# Patient Record
Sex: Male | Born: 1940 | Race: White | Hispanic: No | Marital: Married | Smoking: Former smoker
Health system: Southern US, Community
[De-identification: ages and names within clinical notes are randomized; demographics above are authoritative.]

## PROBLEM LIST (undated history)

## (undated) DIAGNOSIS — E785 Hyperlipidemia, unspecified: Secondary | ICD-10-CM

## (undated) DIAGNOSIS — T7840XA Allergy, unspecified, initial encounter: Secondary | ICD-10-CM

## (undated) DIAGNOSIS — K209 Esophagitis, unspecified without bleeding: Secondary | ICD-10-CM

## (undated) DIAGNOSIS — E119 Type 2 diabetes mellitus without complications: Secondary | ICD-10-CM

## (undated) DIAGNOSIS — J9610 Chronic respiratory failure, unspecified whether with hypoxia or hypercapnia: Secondary | ICD-10-CM

## (undated) DIAGNOSIS — J069 Acute upper respiratory infection, unspecified: Secondary | ICD-10-CM

## (undated) DIAGNOSIS — E039 Hypothyroidism, unspecified: Secondary | ICD-10-CM

## (undated) DIAGNOSIS — M19019 Primary osteoarthritis, unspecified shoulder: Secondary | ICD-10-CM

## (undated) DIAGNOSIS — N529 Male erectile dysfunction, unspecified: Secondary | ICD-10-CM

## (undated) DIAGNOSIS — R55 Syncope and collapse: Secondary | ICD-10-CM

## (undated) DIAGNOSIS — K222 Esophageal obstruction: Secondary | ICD-10-CM

## (undated) DIAGNOSIS — R42 Dizziness and giddiness: Secondary | ICD-10-CM

## (undated) DIAGNOSIS — G629 Polyneuropathy, unspecified: Secondary | ICD-10-CM

## (undated) DIAGNOSIS — I471 Supraventricular tachycardia, unspecified: Secondary | ICD-10-CM

## (undated) DIAGNOSIS — J449 Chronic obstructive pulmonary disease, unspecified: Secondary | ICD-10-CM

## (undated) DIAGNOSIS — J322 Chronic ethmoidal sinusitis: Secondary | ICD-10-CM

## (undated) DIAGNOSIS — J45909 Unspecified asthma, uncomplicated: Secondary | ICD-10-CM

## (undated) DIAGNOSIS — I480 Paroxysmal atrial fibrillation: Secondary | ICD-10-CM

## (undated) DIAGNOSIS — C679 Malignant neoplasm of bladder, unspecified: Secondary | ICD-10-CM

## (undated) DIAGNOSIS — I951 Orthostatic hypotension: Secondary | ICD-10-CM

## (undated) DIAGNOSIS — Z923 Personal history of irradiation: Secondary | ICD-10-CM

## (undated) DIAGNOSIS — G4700 Insomnia, unspecified: Secondary | ICD-10-CM

## (undated) DIAGNOSIS — C3490 Malignant neoplasm of unspecified part of unspecified bronchus or lung: Secondary | ICD-10-CM

## (undated) HISTORY — PX: CYSTOSCOPY W/ DILATION OF BLADDER: SUR374

## (undated) HISTORY — DX: Malignant neoplasm of unspecified part of unspecified bronchus or lung: C34.90

## (undated) HISTORY — DX: Dizziness and giddiness: R42

## (undated) HISTORY — DX: Hyperlipidemia, unspecified: E78.5

## (undated) HISTORY — DX: Primary osteoarthritis, unspecified shoulder: M19.019

## (undated) HISTORY — DX: Malignant neoplasm of bladder, unspecified: C67.9

## (undated) HISTORY — DX: Insomnia, unspecified: G47.00

## (undated) HISTORY — DX: Allergy, unspecified, initial encounter: T78.40XA

## (undated) HISTORY — DX: Male erectile dysfunction, unspecified: N52.9

## (undated) HISTORY — PX: TONSILLECTOMY: SUR1361

---

## 1997-07-01 ENCOUNTER — Ambulatory Visit (HOSPITAL_COMMUNITY): Admission: RE | Admit: 1997-07-01 | Discharge: 1997-07-01 | Payer: Self-pay | Admitting: Gastroenterology

## 1997-10-25 ENCOUNTER — Emergency Department (HOSPITAL_COMMUNITY): Admission: EM | Admit: 1997-10-25 | Discharge: 1997-10-25 | Payer: Self-pay | Admitting: Emergency Medicine

## 1997-10-25 ENCOUNTER — Encounter: Payer: Self-pay | Admitting: Emergency Medicine

## 1998-05-19 ENCOUNTER — Ambulatory Visit (HOSPITAL_COMMUNITY): Admission: RE | Admit: 1998-05-19 | Discharge: 1998-05-19 | Payer: Self-pay | Admitting: General Surgery

## 1998-05-19 ENCOUNTER — Encounter: Payer: Self-pay | Admitting: General Surgery

## 2007-01-09 HISTORY — PX: COLONOSCOPY: SHX174

## 2010-07-24 ENCOUNTER — Encounter: Payer: Self-pay | Admitting: Gastroenterology

## 2011-09-27 ENCOUNTER — Encounter: Payer: Self-pay | Admitting: Gastroenterology

## 2012-03-28 ENCOUNTER — Other Ambulatory Visit: Payer: Self-pay | Admitting: *Deleted

## 2012-03-28 DIAGNOSIS — E785 Hyperlipidemia, unspecified: Secondary | ICD-10-CM

## 2012-03-28 DIAGNOSIS — E039 Hypothyroidism, unspecified: Secondary | ICD-10-CM

## 2012-03-28 DIAGNOSIS — R42 Dizziness and giddiness: Secondary | ICD-10-CM

## 2012-05-09 ENCOUNTER — Other Ambulatory Visit: Payer: Medicare Other

## 2012-05-09 DIAGNOSIS — E785 Hyperlipidemia, unspecified: Secondary | ICD-10-CM

## 2012-05-09 DIAGNOSIS — E039 Hypothyroidism, unspecified: Secondary | ICD-10-CM

## 2012-05-09 DIAGNOSIS — R42 Dizziness and giddiness: Secondary | ICD-10-CM

## 2012-05-10 LAB — COMPREHENSIVE METABOLIC PANEL
ALT: 39 IU/L (ref 0–44)
AST: 26 IU/L (ref 0–40)
Albumin/Globulin Ratio: 1.5 (ref 1.1–2.5)
Albumin: 4.2 g/dL (ref 3.5–4.8)
Alkaline Phosphatase: 82 IU/L (ref 39–117)
BUN/Creatinine Ratio: 17 (ref 10–22)
BUN: 16 mg/dL (ref 8–27)
CO2: 22 mmol/L (ref 19–28)
Calcium: 9.5 mg/dL (ref 8.6–10.2)
Chloride: 104 mmol/L (ref 97–108)
Creatinine, Ser: 0.96 mg/dL (ref 0.76–1.27)
GFR calc Af Amer: 92 mL/min/{1.73_m2} (ref >59)
GFR calc non Af Amer: 79 mL/min/{1.73_m2} (ref >59)
Globulin, Total: 2.8 g/dL (ref 1.5–4.5)
Glucose: 87 mg/dL (ref 65–99)
Potassium: 4.9 mmol/L (ref 3.5–5.2)
Sodium: 143 mmol/L (ref 134–144)
Total Bilirubin: 0.4 mg/dL (ref 0.0–1.2)
Total Protein: 7 g/dL (ref 6.0–8.5)

## 2012-05-10 LAB — MICROALBUMIN / CREATININE URINE RATIO
Creatinine, Ur: 272.7 mg/dL (ref 22.0–328.0)
MICROALB/CREAT RATIO: 3.4 mg/g creat (ref 0.0–30.0)
Microalbumin, Urine: 9.4 ug/mL (ref 0.0–17.0)

## 2012-05-10 LAB — TSH: TSH: 3.74 u[IU]/mL (ref 0.450–4.500)

## 2012-05-10 LAB — HEMOGLOBIN A1C
Est. average glucose Bld gHb Est-mCnc: 134 mg/dL
Hgb A1c MFr Bld: 6.3 % — ABNORMAL HIGH (ref 4.8–5.6)

## 2012-05-12 ENCOUNTER — Other Ambulatory Visit: Payer: Self-pay

## 2012-05-14 ENCOUNTER — Encounter: Payer: Self-pay | Admitting: *Deleted

## 2012-05-14 ENCOUNTER — Ambulatory Visit (INDEPENDENT_AMBULATORY_CARE_PROVIDER_SITE_OTHER): Payer: Medicare Other | Admitting: Internal Medicine

## 2012-05-14 ENCOUNTER — Encounter: Payer: Self-pay | Admitting: Internal Medicine

## 2012-05-14 VITALS — BP 140/86 | HR 51 | Temp 98.0°F | Resp 14 | Ht 71.0 in | Wt 214.8 lb

## 2012-05-14 DIAGNOSIS — E138 Other specified diabetes mellitus with unspecified complications: Secondary | ICD-10-CM

## 2012-05-14 DIAGNOSIS — E1365 Other specified diabetes mellitus with hyperglycemia: Secondary | ICD-10-CM

## 2012-05-14 DIAGNOSIS — M19019 Primary osteoarthritis, unspecified shoulder: Secondary | ICD-10-CM

## 2012-05-14 DIAGNOSIS — E785 Hyperlipidemia, unspecified: Secondary | ICD-10-CM | POA: Insufficient documentation

## 2012-05-14 DIAGNOSIS — C679 Malignant neoplasm of bladder, unspecified: Secondary | ICD-10-CM | POA: Insufficient documentation

## 2012-05-14 DIAGNOSIS — N529 Male erectile dysfunction, unspecified: Secondary | ICD-10-CM

## 2012-05-14 DIAGNOSIS — R5381 Other malaise: Secondary | ICD-10-CM

## 2012-05-14 DIAGNOSIS — R5383 Other fatigue: Secondary | ICD-10-CM

## 2012-05-14 NOTE — Progress Notes (Signed)
Subjective:    Patient ID: Steve [name], male    DOB: Sep 18, 1940, 72 y.o.   MRN: [id number]  Chief Complaint  Patient presents with  . Medical Managment of Chronic Issues   HPI Patient here for routine follow up.  Feels tired easily. Walks a couple of days a week.he feels tired to go to bed at 8:30-9 pm. 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 last seen 02/07/12 and then lost to follow up with bad weather. No urinary complaints He continues to have erectile dysfunction. He has been provided with levitra but has not taken it. He feels to have lost the desire for intercourse and wants to hold off on medication for now Does not exercise on regular basis Stopped taking meclizine as his dizziness resolved and currently symptom free No other complaints  Review of Systems  Constitutional: Positive for fatigue. Negative for fever, chills and appetite change.  HENT: Negative for ear pain, congestion, rhinorrhea, mouth sores and neck pain.   Eyes: Negative for visual disturbance.  Respiratory: Negative for cough and shortness of breath.   Cardiovascular: Negative for chest pain, palpitations and leg swelling.  Gastrointestinal: Negative for abdominal pain, constipation, blood in stool and abdominal distention.  Endocrine: Negative for cold intolerance, polydipsia and polyuria.  Genitourinary: Negative for hematuria, discharge and difficulty urinating.       Has lost follow up with urology.  Musculoskeletal: Positive for arthralgias. Negative for joint swelling.  Skin: Negative for rash and wound.  Neurological: Negative for dizziness, syncope, weakness, light-headedness and numbness.  Hematological: Negative for adenopathy.  Psychiatric/Behavioral: Negative for suicidal ideas, hallucinations, confusion, sleep disturbance and agitation.       Objective:   Physical Exam  Constitutional: He is oriented to person, place, and time. He appears well-developed and  well-nourished. No distress.  HENT:  Head: Normocephalic and atraumatic.  Mouth/Throat: Oropharynx is clear and moist.  Eyes: Pupils are equal, round, and reactive to light.  Neck: Normal range of motion. Neck supple.  Cardiovascular: Normal rate, regular rhythm and normal heart sounds.   Pulmonary/Chest: Effort normal and breath sounds normal. He has no wheezes. He has no rales.  Abdominal: Soft. Bowel sounds are normal. He exhibits no distension. There is no tenderness.  Musculoskeletal: Normal range of motion. He exhibits no edema and no tenderness.  Crepitus present in right shoulder > left shoulder, no signs of inflammation  Neurological: He is alert and oriented to person, place, and time.  Skin: Skin is warm and dry. He is not diaphoretic.  Psychiatric: He has a normal mood and affect. His behavior is normal.     BP 140/86  Pulse 51  Temp(Src) 98 F (36.7 C) (Oral)  Resp 14  Ht 5\' 11"  (1.803 m)  Wt 214 lb 12.8 oz (97.433 kg)  BMI 29.97 kg/m2     Labs reviewed  CMP     Component Value Date/Time   NA 143 05/09/2012 0854   K 4.9 05/09/2012 0854   CL 104 05/09/2012 0854   CO2 22 05/09/2012 0854   GLUCOSE 87 05/09/2012 0854   BUN 16 05/09/2012 0854   CREATININE 0.96 05/09/2012 0854   CALCIUM 9.5 05/09/2012 0854   PROT 7.0 05/09/2012 0854   AST 26 05/09/2012 0854   ALT 39 05/09/2012 0854   ALKPHOS 82 05/09/2012 0854   BILITOT 0.4 05/09/2012 0854   GFRNONAA 79 05/09/2012 0854   GFRAA 92 05/09/2012 0854   a1c 6.3  tsh 3.740  Urine microalbumin normal  01/26/12 wbc 8.5, hb 15.4, hct 45.9, plt 309, glu 74, bun 23, cr 0.92, na 143, k 4.4, cl 100, ca 9.5, lft wnl, a1c 6.5, total chol 206, hdl 48, ldl 189, ldl 120, psa 1.3, tsh 4.790  Assessment & Plan:   Secondary DM - off medication. Currently diet controlled. Monitor a1c periodically.   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 monitor  Impotence- pt has not taken prescribed levitra. [facility]  Hyperlipidemia- reviewed flp from 1/14. Continue zocor 20 mg daily  Fatigue- normal hb/hct, no electrolyte abnormality. Normal thyroid panel. Concerns for side effect of statin but normal LFT vs fibromyalgia with diffuse aches and tiredness. Mood is fine so depression unlikely. Will monitor for now. If this persists or worsens, will get CK and ESR level checked

## 2012-06-19 ENCOUNTER — Other Ambulatory Visit (HOSPITAL_BASED_OUTPATIENT_CLINIC_OR_DEPARTMENT_OTHER): Payer: Self-pay | Admitting: Internal Medicine

## 2012-07-01 ENCOUNTER — Other Ambulatory Visit (HOSPITAL_BASED_OUTPATIENT_CLINIC_OR_DEPARTMENT_OTHER): Payer: Self-pay | Admitting: Internal Medicine

## 2012-07-01 ENCOUNTER — Other Ambulatory Visit: Payer: Self-pay | Admitting: *Deleted

## 2012-07-01 MED ORDER — SIMVASTATIN 20 MG PO TABS: ORAL_TABLET | ORAL | Status: DC

## 2012-07-29 ENCOUNTER — Encounter: Payer: Self-pay | Admitting: Internal Medicine

## 2012-07-29 ENCOUNTER — Ambulatory Visit (INDEPENDENT_AMBULATORY_CARE_PROVIDER_SITE_OTHER): Payer: Medicare Other | Admitting: Internal Medicine

## 2012-07-29 VITALS — BP 132/78 | HR 64 | Temp 97.1°F | Resp 14 | Ht 71.0 in | Wt 210.2 lb

## 2012-07-29 DIAGNOSIS — J069 Acute upper respiratory infection, unspecified: Secondary | ICD-10-CM

## 2012-07-29 DIAGNOSIS — H8110 Benign paroxysmal vertigo, unspecified ear: Secondary | ICD-10-CM

## 2012-07-29 DIAGNOSIS — E785 Hyperlipidemia, unspecified: Secondary | ICD-10-CM

## 2012-07-29 HISTORY — DX: Acute upper respiratory infection, unspecified: J06.9

## 2012-07-29 MED ORDER — ALBUTEROL SULFATE HFA 108 (90 BASE) MCG/ACT IN AERS: 2.0000 | INHALATION_SPRAY | Freq: Two times a day (BID) | RESPIRATORY_TRACT | Status: DC | PRN

## 2012-07-29 MED ORDER — AZITHROMYCIN 250 MG PO TABS: ORAL_TABLET | ORAL | Status: DC

## 2012-07-29 MED ORDER — MECLIZINE HCL 12.5 MG PO TABS: 12.5000 mg | ORAL_TABLET | Freq: Two times a day (BID) | ORAL | Status: DC

## 2012-07-29 NOTE — Progress Notes (Signed)
Patient ID: GARV KUECHLE, male   DOB: [birth year], 72 y.o.   MRN: [id number]  Chief Complaint  Patient presents with  . Dizziness  . Cough   HPI- 72 y/o male patient is here with complaints of dizziness and cough  Dizziness- He continues to have dizzy spells especially with change of position. He feels flushed from his neck upwards. Denies any chest pain, SOB, palpitations or loss of consciousness.   Cough- He has noticed cough which is mostly dry and has wheezing for a month. He feels stuffed in his head and has humming sound in his head. Denies ringing in his ears. No sore throat. No earache or ear discharge.  He used to smoke until 6 years back  Review of Systems  Constitutional: Negative for fever, chills and diaphoresis.  HENT: Negative for congestion.   Eyes: Negative for blurred vision.  Respiratory: Negative for cough and shortness of breath.   Cardiovascular: Negative for chest pain, palpitations and leg swelling.  Gastrointestinal: Negative for heartburn, nausea, abdominal pain and constipation.  Genitourinary: Negative for dysuria.  Musculoskeletal: Negative for myalgias and falls.  Skin: Negative for itching and rash.  Neurological: Negative for focal weakness, weakness and headaches.  Psychiatric/Behavioral: Negative for depression.    No Known Allergies  Constitutional: He is oriented to person, place, and time. He appears well-developed and well-nourished. No distress.  HENT:   Head: Normocephalic and atraumatic.   Mouth/Throat: Oropharyngeal erythema present Eyes: Pupils are equal, round, and reactive to light.  Neck: Normal range of motion. Neck supple. No cervical lymphadenopathy Cardiovascular: Normal rate, regular rhythm and normal heart sounds.   Pulmonary/Chest: Effort normal and breath sounds normal. He has wheezes. He has no rales.  Abdominal: Soft. Bowel sounds are normal. He exhibits no distension. There is no tenderness.  Musculoskeletal: Normal  range of motion. He exhibits no edema and no tenderness.  Crepitus present in right shoulder > left shoulder, no signs of inflammation  Neurological: He is alert and oriented to person, place, and time.  Skin: Skin is warm and dry. He is not diaphoretic.  Psychiatric: He has a normal mood and affect. His behavior is normal.   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 Normal cardiac exam. Will have him on meclizine 12.5 mg bid for now and reassess in 3 weeks. Warned about chets pain, palpitations  Uri- with cough, oropharyngeal erythema and new wheeze concerned for URI. Will have him on azithromycin x 5 days with albuterol inhaler. Reassess after this for clinical assessment for COPD given his hx of smoking. Stop claritin for now  Hyperlipidemia- continue statin and monitor for now

## 2012-08-19 ENCOUNTER — Encounter: Payer: Self-pay | Admitting: Internal Medicine

## 2012-08-19 ENCOUNTER — Ambulatory Visit (INDEPENDENT_AMBULATORY_CARE_PROVIDER_SITE_OTHER): Payer: Medicare Other | Admitting: Internal Medicine

## 2012-08-19 VITALS — BP 132/86 | HR 67 | Temp 97.9°F | Resp 14 | Ht 71.0 in | Wt 207.2 lb

## 2012-08-19 DIAGNOSIS — M19019 Primary osteoarthritis, unspecified shoulder: Secondary | ICD-10-CM

## 2012-08-19 DIAGNOSIS — J4520 Mild intermittent asthma, uncomplicated: Secondary | ICD-10-CM

## 2012-08-19 DIAGNOSIS — E785 Hyperlipidemia, unspecified: Secondary | ICD-10-CM

## 2012-08-19 DIAGNOSIS — J45909 Unspecified asthma, uncomplicated: Secondary | ICD-10-CM

## 2012-08-19 MED ORDER — BECLOMETHASONE DIPROPIONATE 40 MCG/ACT IN AERS: 1.0000 | INHALATION_SPRAY | Freq: Two times a day (BID) | RESPIRATORY_TRACT | Status: DC

## 2012-08-19 NOTE — Progress Notes (Signed)
Patient ID: ERICE AHLES, male   DOB: 06-30-1940, 72 y.o.   MRN: [id number]  Chief Complaint  Patient presents with  . Medical Managment of Chronic Issues    still has some wheezing    No Known Allergies  HPI Patient here for routine follow up.  his cough and wheezing had improved but the other day he was mowing his yard and felt he was wheezing again. Has been a smoker in past. 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 last seen 02/07/12 and then lost to follow up with bad weather. No urinary complaints He continues to have erectile dysfunction. He has been provided with levitra but has not taken it. He feels to have lost the desire for intercourse and wants to hold off on medication for now  Does not exercise on regular basis. Tries to be careful with his diet   Review of Systems  Constitutional: Negative for fever, chills and diaphoresis.  HENT: Negative for congestion.   Eyes: Negative for blurred vision.  Respiratory: has shortness of breath with moderate exertion Cardiovascular: Negative for chest pain, palpitations and leg swelling.  Gastrointestinal: Negative for heartburn, nausea, vomiting, abdominal pain and constipation.  Genitourinary: Negative for dysuria.  Musculoskeletal: Negative for myalgias and falls.  Skin: Negative for itching and rash.  Neurological: Negative for focal weakness, weakness and headaches.  Psychiatric/Behavioral: Negative for depression.   No Known Allergies  BP 132/86  Pulse 67  Temp(Src) 97.9 F (36.6 C) (Oral)  Resp 14  Ht 5\' 11"  (1.803 m)  Wt 207 lb 3.2 oz (93.985 kg)  BMI 28.91 kg/m2  SpO2 97%  Physical Exam  Constitutional: He is oriented to person, place, and time. He appears well-developed and well-nourished. No distress.  HENT:   Head: Normocephalic and atraumatic.   Mouth/Throat: Oropharynx is clear and moist.   Eyes: Pupils are equal, round, and reactive to light.  Neck: Normal range of motion. Neck supple.  Cardiovascular: Normal rate, regular rhythm and normal heart sounds.   Pulmonary/Chest: Effort normal and breath sounds normal. He has no wheezes. He has no rales. No accessory muscle use at present Abdominal: Soft. Bowel sounds are normal. He exhibits no distension. There is no tenderness.  Musculoskeletal: Normal range of motion. He exhibits no edema and no tenderness.  Crepitus present in right shoulder > left shoulder, no signs of inflammation  Neurological: He is alert and oriented to person, place, and time.  Skin: Skin is warm and dry. He is not diaphoretic.  Psychiatric: He has a normal mood and affect. His behavior is normal.    Assessment/plan  Reactive airway disease- likely to environmental allergies. S/o mild intermittent asthma without exacerbation. Continue albuterol inhaler and will add beclomethasone inhaler bid for now.   Secondary DM - off medication. Currently diet controlled. Monitor a1c periodically.   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 monitor  Hyperlipidemia- Continue zocor 20 mg daily

## 2012-09-16 ENCOUNTER — Ambulatory Visit (INDEPENDENT_AMBULATORY_CARE_PROVIDER_SITE_OTHER): Payer: Medicare Other | Admitting: Internal Medicine

## 2012-09-16 ENCOUNTER — Encounter: Payer: Self-pay | Admitting: Internal Medicine

## 2012-09-16 VITALS — BP 142/78 | HR 74 | Temp 98.3°F | Resp 18 | Ht 71.0 in | Wt 206.8 lb

## 2012-09-16 DIAGNOSIS — J45909 Unspecified asthma, uncomplicated: Secondary | ICD-10-CM

## 2012-09-16 DIAGNOSIS — Z23 Encounter for immunization: Secondary | ICD-10-CM

## 2012-09-16 DIAGNOSIS — M19019 Primary osteoarthritis, unspecified shoulder: Secondary | ICD-10-CM

## 2012-09-16 DIAGNOSIS — H8110 Benign paroxysmal vertigo, unspecified ear: Secondary | ICD-10-CM

## 2012-09-16 MED ORDER — MELOXICAM 15 MG PO TABS: ORAL_TABLET | ORAL | Status: DC

## 2012-09-16 MED ORDER — MECLIZINE HCL 12.5 MG PO TABS: 12.5000 mg | ORAL_TABLET | Freq: Two times a day (BID) | ORAL | Status: DC

## 2012-09-16 NOTE — Progress Notes (Signed)
Patient ID: Steve [name], male   DOB: Jul 30, 1940, 72 y.o.   MRN: [id number]   Chief Complaint  Patient presents with  . Follow-up    reactive airway disease, OA  . Dizziness    dizziness    No Known Allergies  HPI Patient here for routine follow up. He complaints of being dizzy again with change of position. Denies any ringing in his ears. No headache or blurry vision during the episode. No chets pain or racing of heart associated with it. Denies syncopal episode. He has been having pain in his shoulder joints. Denies any swelling or redness of joint. Pain is worse with exercise. Relieved by rest and meloxicam  He is using the breathing treatment and feels his breathing, cough and wheezing to have improved.  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 last seen 02/07/12  Compliant with his medications  SBP mildly elevated this visit  Review of Systems   Constitutional: Negative for fever, chills and diaphoresis.   HENT: Negative for congestion.    Eyes: Negative for blurred vision.   Respiratory: negative for shortness of breath, cough  Cardiovascular: Negative for chest pain, palpitations and leg swelling.   Gastrointestinal: Negative for heartburn, nausea, vomiting, abdominal pain and constipation.   Genitourinary: Negative for dysuria.   Musculoskeletal: Negative for myalgias and falls.   Skin: Negative for itching and rash.   Neurological: Negative for focal weakness, weakness and headaches.   Psychiatric/Behavioral: Negative for depression.   BP 142/78  Pulse 74  Temp(Src) 98.3 F (36.8 C) (Oral)  Resp 18  Ht 5\' 11"  (1.803 m)  Wt 206 lb 12.8 oz (93.804 kg)  BMI 28.86 kg/m2  SpO2 98%  Physical Exam  Constitutional: He is oriented to person, place, and time. He appears well-developed and well-nourished. No distress.   HENT:   Head: Normocephalic and atraumatic.   Mouth/Throat: Oropharynx is clear and moist.   Eyes: Pupils are equal,  round, and reactive to light.   Neck: Normal range of motion. Neck supple.   Cardiovascular: Normal rate, regular rhythm and normal heart sounds.    Pulmonary/Chest: Effort normal and breath sounds normal. He has no wheezes. He has no rales. No accessory muscle use  Abdominal: Soft. Bowel sounds are normal. He exhibits no distension. There is no tenderness.  Musculoskeletal: Normal range of motion. He exhibits no edema and no tenderness.  Crepitus present in right shoulder > left shoulder, no signs of inflammation  Neurological: He is alert and oriented to person, place, and time.   Skin: Skin is warm and dry. He is not diaphoretic.  Psychiatric: He has a normal mood and affect. His behavior is normal.   Assessment/plan  Dizziness- appears to be positional with BPV. Will start him on meclizine 12.5 mg bid for now. Reassess if no improvement  Reactive airway disease- concerns for emphysema changes given his hx of smoking. Will get cxr to assess for this. To continue proventil prn and qvar bid for now. Influenza vaccine provided  Shoulder OA- under control, continue meloxicam  Hyperlipidemia- continue zocor for now

## 2012-09-20 DIAGNOSIS — Z23 Encounter for immunization: Secondary | ICD-10-CM | POA: Insufficient documentation

## 2012-09-26 ENCOUNTER — Encounter: Payer: Self-pay | Admitting: Internal Medicine

## 2012-11-14 ENCOUNTER — Other Ambulatory Visit: Payer: Medicare Other

## 2012-11-17 ENCOUNTER — Other Ambulatory Visit: Payer: Medicare Other

## 2012-11-17 ENCOUNTER — Telehealth: Payer: Self-pay

## 2012-11-17 DIAGNOSIS — E785 Hyperlipidemia, unspecified: Secondary | ICD-10-CM

## 2012-11-17 DIAGNOSIS — E1365 Other specified diabetes mellitus with hyperglycemia: Secondary | ICD-10-CM

## 2012-11-17 NOTE — Telephone Encounter (Signed)
  FYI Pt wanted to change to Dr Jarold Motto due to you didn't have an early appt available for propcolon on the date he wanted (He wasn't willing to wait.)

## 2012-11-17 NOTE — Telephone Encounter (Signed)
No problem.

## 2012-11-18 LAB — BASIC METABOLIC PANEL
GFR calc Af Amer: 93 mL/min/{1.73_m2} (ref >59)
GFR calc non Af Amer: 80 mL/min/{1.73_m2} (ref >59)
Glucose: 104 mg/dL — ABNORMAL HIGH (ref 65–99)
Potassium: 4.8 mmol/L (ref 3.5–5.2)

## 2012-11-18 LAB — LIPID PANEL
Cholesterol, Total: 196 mg/dL (ref 100–199)
LDL Calculated: 119 mg/dL — ABNORMAL HIGH (ref 0–99)
Triglycerides: 160 mg/dL — ABNORMAL HIGH (ref 0–149)
VLDL Cholesterol Cal: 32 mg/dL (ref 5–40)

## 2012-11-19 ENCOUNTER — Ambulatory Visit
Admission: RE | Admit: 2012-11-19 | Discharge: 2012-11-19 | Disposition: A | Discharge status: Home / Self Care | Payer: Medicare Other | Source: Ambulatory Visit | Attending: Internal Medicine | Admitting: Internal Medicine

## 2012-11-19 ENCOUNTER — Encounter (INDEPENDENT_AMBULATORY_CARE_PROVIDER_SITE_OTHER): Payer: Self-pay

## 2012-11-19 ENCOUNTER — Encounter: Payer: Self-pay | Admitting: Internal Medicine

## 2012-11-19 ENCOUNTER — Other Ambulatory Visit: Payer: Self-pay | Admitting: Internal Medicine

## 2012-11-19 ENCOUNTER — Ambulatory Visit (INDEPENDENT_AMBULATORY_CARE_PROVIDER_SITE_OTHER): Payer: Medicare Other | Admitting: Internal Medicine

## 2012-11-19 VITALS — BP 144/78 | HR 70 | Temp 97.7°F | Resp 16 | Ht 72.25 in | Wt 207.2 lb

## 2012-11-19 DIAGNOSIS — E039 Hypothyroidism, unspecified: Secondary | ICD-10-CM | POA: Insufficient documentation

## 2012-11-19 DIAGNOSIS — J9811 Atelectasis: Secondary | ICD-10-CM

## 2012-11-19 DIAGNOSIS — E785 Hyperlipidemia, unspecified: Secondary | ICD-10-CM

## 2012-11-19 DIAGNOSIS — E038 Other specified hypothyroidism: Secondary | ICD-10-CM

## 2012-11-19 DIAGNOSIS — J45909 Unspecified asthma, uncomplicated: Secondary | ICD-10-CM

## 2012-11-19 DIAGNOSIS — N529 Male erectile dysfunction, unspecified: Secondary | ICD-10-CM

## 2012-11-19 DIAGNOSIS — H8110 Benign paroxysmal vertigo, unspecified ear: Secondary | ICD-10-CM

## 2012-11-19 DIAGNOSIS — E1365 Other specified diabetes mellitus with hyperglycemia: Secondary | ICD-10-CM

## 2012-11-19 DIAGNOSIS — C679 Malignant neoplasm of bladder, unspecified: Secondary | ICD-10-CM

## 2012-11-19 DIAGNOSIS — M19019 Primary osteoarthritis, unspecified shoulder: Secondary | ICD-10-CM

## 2012-11-19 DIAGNOSIS — Z23 Encounter for immunization: Secondary | ICD-10-CM

## 2012-11-19 MED ORDER — TETANUS-DIPHTHERIA TOXOIDS TD 2-2 LF/0.5ML IM SUSP: 0.5000 mL | Freq: Once | INTRAMUSCULAR | Status: DC

## 2012-11-19 NOTE — Addendum Note (Signed)
Addended by: Waymond Cera on: 11/19/2012 11:17 AM   Modules accepted: Orders

## 2012-11-19 NOTE — Progress Notes (Signed)
Patient ID: Steve [name], male   DOB: 1940-09-08, 72 y.o.   MRN: [id number]   No Known Allergies  Chief Complaint  Patient presents with  . Annual Exam    physical with labs printed  . Cough    cough, SOB  x 1 yr   . Immunizations    will get Pneumo Vaccine today and needs Rxfor Tdap  . other    colonoscopy schedule 12/24/12 w/ GI, Dr Jarold Motto    HPI:  72 y/o male patient here for his annual visit. He continues to have ocassional dizziness with sudden change in position but imporved from before. He does not want to continue meclizine any more. No syncopal episode. He has pain in right shoulder intermittently He was using the breathing treatment-- albuterol but feels he has some dry wheezing and dry cough occassionally. He has ran out of the albuterol. 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 last seen 02/07/12   Compliant with his medications Reviewed his blood work result with pt and ekg from 2013  Review of Systems  Constitutional: Negative for fever, chills, weight loss, malaise/fatigue and diaphoresis.  HENT: Negative for congestion, hearing loss and sore throat.   Eyes: Negative for blurred vision, double vision and discharge.  Respiratory: Negative for sputum production, shortness of breath and wheezing.  has dry cough Cardiovascular: Negative for chest pain, palpitations, orthopnea and leg swelling.  Gastrointestinal: Negative for heartburn, nausea, vomiting, abdominal pain, diarrhea and constipation.  Genitourinary: Negative for dysuria, urgency, frequency and flank pain.  Musculoskeletal: Negative for back pain, falls and myalgias. arthritis pain in his shoulders right > left Skin: Negative for itching and rash.  Neurological: Negative for dizziness, tingling, focal weakness and headaches.  Psychiatric/Behavioral: Negative for depression and memory loss. The patient is not nervous/anxious.     Past Medical History  Diagnosis Date   . Thyroid disease   . Secondary diabetes mellitus with unspecified complication, uncontrolled   . Dizziness and giddiness   . Malignant neoplasm of bladder, part unspecified   . Hyperlipidemia   . Impotence of organic origin   . Osteoarthritis, shoulder   . Allergy    Past Surgical History  Procedure Laterality Date  . Cystoscopy w/ dilation of bladder    . Tonsillectomy     Social History:   reports that he has quit smoking. He does not have any smokeless tobacco history on file. He reports that he drinks alcohol. He reports that he does not use illicit drugs.  Family History  Problem Relation Age of Onset  . Kidney disease Brother     Medications: Patient's Medications  New Prescriptions   No medications on file  Previous Medications   DIPTHERIA-TETANUS TOXOIDS (DECAVAC) 2-2 LF/0.5ML INJECTION    Inject 0.5 mLs into the muscle once.   MELOXICAM (MOBIC) 15 MG TABLET    TAKE ONE TABLET BY MOUTH ONCE DAILY   SIMVASTATIN (ZOCOR) 20 MG TABLET    Take one tablet by mouth once daily to lower cholesterol  Modified Medications   No medications on file  Discontinued Medications   ALBUTEROL (PROVENTIL HFA;VENTOLIN HFA) 108 (90 BASE) MCG/ACT INHALER    Inhale 2 puffs into the lungs every 12 (twelve) hours as needed for wheezing or shortness of breath.   BECLOMETHASONE (QVAR) 40 MCG/ACT INHALER    Inhale 1 puff into the lungs 2 (two) times daily.   MECLIZINE (ANTIVERT) 12.5 MG TABLET    Take 1 tablet (  12.5 mg total) by mouth 2 (two) times daily.     Physical Exam: Filed Vitals:   11/19/12 0845  BP: 144/78  Pulse: 70  Temp: 97.7 F (36.5 C)  TempSrc: Oral  Resp: 16  Height: 6' 0.25" (1.835 m)  Weight: 207 lb 3.2 oz (93.985 kg)  SpO2: 99%   General- elderly male in no acute distress Head- atraumatic, normocephalic Eyes- PERRLA, EOMI, no pallor, no icterus, no discharge Neck- no lymphadenopathy, no thyromegaly, no jugular vein distension, no carotid bruit Ears- left ear  normal tympanic membrane and normal external ear canal , right ear normal tympanic membrane and normal external ear canal Chest- no chest wall deformities, no chest wall tenderness Cardiovascular- normal s1,s2, no murmurs/ rubs/ gallops Respiratory- bilateral clear to auscultation, no wheeze, no rhonchi, no crackles Abdomen- bowel sounds present, soft, non tender, no organomegaly, no abdominal bruits, no guarding or rigidity, no CVA tenderness Musculoskeletal- able to move all 4 extremities, no spinal and paraspinal tenderness, steady gait, no use of assistive device Neurological- no focal deficit, normal reflexes, normal muscle strength, normal sensation to fine touch and vibration Psychiatry- alert and oriented to person, place and time, normal mood and affect Skin- dry skin, warm   Labs reviewed: Basic Metabolic Panel:  Recent Labs  32/95/18 0854 11/17/12 0805  NA 143 143  K 4.9 4.8  CL 104 103  CO2 22 26  GLUCOSE 87 104*  BUN 16 13  CREATININE 0.96 0.95  CALCIUM 9.5 10.0   Liver Function Tests:  Recent Labs  05/09/12 0854  AST 26  ALT 39  ALKPHOS 82  BILITOT 0.4  PROT 7.0   Lipid Panel     Component Value Date/Time   TRIG 160* 11/17/2012 0805   HDL 45 11/17/2012 0805   CHOLHDL 4.4 11/17/2012 0805   LDLCALC 119* 11/17/2012 0805   2013 ekg- NSR, normal axis  Assessment/Plan  General exam- uptodate with influenza vaccine. Will provide pneumococcal vaccine. Does not want shingles vaccine. Wants to wait until new insurance gets effective for tdap. Has upcoming colonoscopy. Has been a smoker in the past. Does not want abdominal usg for screening of AAA. Reviewed labs with patient  Persistent cough/ reactive airway disease- will stop albuterol and qvar with pt not benefitting from it and not using it anymore. Pt does not want to try any other inhaler. i suspect copd/ emphysema with his hx of smoking in the past. Will get PA/lat cxr to rule out copd  changes  Hyperlipidemia- continue zocor, reviewed lab result. Dietary and exercise counseling provided  Arthritis- continue meloxicam for now, monitor clinically  BPV- under control with slow position changes, pt want to stay off meds and that is perfectly fine if symptoms are under control with position changes and exercises  Secondary dm- type2, diet  Controlled, check a1c prior to next visit  Bladder neoplasm- follows with urology  Impotence- has levitra provided by urology, has not used it recently  Subclinical hypothyroidism- currently asymptomatic, recheck tsh prior to next visit  Labs/tests ordered- cbc, lipid panel, cmp next visit

## 2012-11-24 ENCOUNTER — Ambulatory Visit
Admission: RE | Admit: 2012-11-24 | Discharge: 2012-11-24 | Disposition: A | Discharge status: Home / Self Care | Payer: Medicare Other | Source: Ambulatory Visit | Attending: Internal Medicine | Admitting: Internal Medicine

## 2012-11-24 ENCOUNTER — Other Ambulatory Visit: Payer: Medicare Other

## 2012-11-24 DIAGNOSIS — J9811 Atelectasis: Secondary | ICD-10-CM

## 2012-11-24 MED ORDER — IOHEXOL 300 MG/ML  SOLN
75.0000 mL | Freq: Once | INTRAMUSCULAR | Status: AC | PRN
  Administered 2012-11-24: 75 mL via INTRAVENOUS

## 2012-11-25 ENCOUNTER — Telehealth: Payer: Self-pay | Admitting: Internal Medicine

## 2012-11-25 ENCOUNTER — Other Ambulatory Visit: Payer: Self-pay | Admitting: *Deleted

## 2012-11-25 DIAGNOSIS — C801 Malignant (primary) neoplasm, unspecified: Secondary | ICD-10-CM

## 2012-11-25 NOTE — Telephone Encounter (Signed)
Dr. Kearney Hard, Radiologist from [facility]  Radiology called.  Because PCP was not available.  I referred to Dr. Merrilee Seashore.... Dr. Lyn Hollingshead spoke with Radiologist.  Recommended a Bronchoscopy.  This test is not scheduled at the hospital.  Steve [name] had to be scheduled to a Pulmonologist first.     Dr. Lyn Hollingshead also spoke with Mr. Judson. Appointment scheduled for  Wednesday, Nov 26, 2012 at 3:30 pm with [facility] Pulmonary Dept. If Steve [name] has questions before Dr. Glade Lloyd returns.  Dr. Lyn Hollingshead has told the patient she would be happy to speak with him.  cdavis

## 2012-11-26 ENCOUNTER — Ambulatory Visit (INDEPENDENT_AMBULATORY_CARE_PROVIDER_SITE_OTHER): Payer: Medicare Other | Admitting: Internal Medicine

## 2012-11-26 ENCOUNTER — Encounter: Payer: Self-pay | Admitting: Internal Medicine

## 2012-11-26 VITALS — BP 132/82 | HR 80 | Ht 72.5 in | Wt 209.0 lb

## 2012-11-26 DIAGNOSIS — R222 Localized swelling, mass and lump, trunk: Secondary | ICD-10-CM

## 2012-11-26 DIAGNOSIS — R918 Other nonspecific abnormal finding of lung field: Secondary | ICD-10-CM

## 2012-11-26 NOTE — Progress Notes (Signed)
Subjective:    Patient ID: Steve [name], male    DOB: 10/06/1940, 72 y.o.   MRN: [id number]  PCP PANDEY, MAHIMA, MD   HPI  IOV 11/26/2012  Chief Complaint  Patient presents with  . Pulmonary Consult    abnormal CT scan. Pt states he had a cxr for physical and it was abnormal so they did a CT. Pt states he has SOB with exertion.  Pt also states he has a productive cough with blood tinged mucus.    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 Reported as primary care physician with subacute history of cough and wheezing for several weeks. Did not improve with inhaler therapy. A chest x-ray was done this was followed by CT scan of the chest 2 days ago 11/24/2012 that shows a right-sided lung mass encasing the right main bronchus and in the subcarinal region. Therefore he is been referred here. In the last few days he's had some streaky hemoptysis. CT scan is reviewed personally and listed below. His nose is in weight loss or chest pain no wheezing or  Lung mass relevant history   reports that he quit smoking about 5 years ago. His smoking use included Cigarettes. He has a 75 pack-year smoking history. His smokeless tobacco use includes Chew.  Remote history of bladder cancer in complete remission   CT 11/24/12   CLINICAL DATA: Right lower lobe collapse on chest x-ray.  EXAM:  CT CHEST WITH CONTRAST  TECHNIQUE:  Multidetector CT imaging of the chest was performed during  intravenous contrast administration.  CONTRAST: 75mL OMNIPAQUE IOHEXOL 300 MG/ML SOLN  COMPARISON: Chest x-ray 11/19/2012  FINDINGS:  There is bulky right hilar and subcarinal adenopathy. Occlusion of  the right bronchus intermedius by a central right hilar/ infrahilar  mass. This is very difficult to measure due to the chole less since  with adenopathy and postobstructive collapse in the right lower  lobe, but the area measures approximately 6.2 x 4.7 cm on image 43  of  series 3. Associated collapse/ atelectasis in the right lower  lobe as seen on chest x-ray. Large supple carina adenopathy measures  4.7 x 2.9 cm. Right hilar adenopathy on image 35 measures 2.0 x 2.0  cm. High right peritracheal lymph node has a short axis diameter of  8 mm. No left hilar adenopathy. No axillary adenopathy.  Densely calcified coronary arteries diffusely. Heart is normal size.  Aorta is normal caliber.  No pleural effusions. Left lung is clear. Chest wall soft tissues  are unremarkable. Imaging into the upper abdomen shows no acute  findings. No acute bony abnormality.  IMPRESSION:  Large right hilar/ infrahilar mass obstructing the bronchus  intermedius with postobstructive process, likely atelectasis in the  right lower lobe. Associated bulky right hilar and subcarinal  adenopathy. Findings most consistent with primary lung cancer.  Coronary artery disease.  Electronically Signed  By: Kevin Dover M.D.  On: 11/24/2012 14:40   Past Medical History  Diagnosis Date  . Thyroid disease   . Secondary diabetes mellitus with unspecified complication, uncontrolled   . Dizziness and giddiness   . Malignant neoplasm of bladder, part unspecified   . Hyperlipidemia   . Impotence of organic origin   . Osteoarthritis, shoulder   . Allergy      Family History  Problem Relation Age of Onset  . Kidney disease Brother      History   Social History  . Marital Status: Married      Spouse Name: N/A    Number of Children: N/A  . Years of Education: N/A   Occupational History  . business owner    Social History Main Topics  . Smoking status: Former Smoker -- 1.50 packs/day for 50 years    Types: Cigarettes    Quit date: 01/09/2007  . Smokeless tobacco: Current User    Types: Chew  . Alcohol Use: Yes     Comment: 4-5 beers a month, 1 mixed drink every 2 months  . Drug Use: No  . Sexual Activity: Not on file   Other Topics Concern  . Not on file   Social History  Narrative  . No narrative on file     No Known Allergies   Outpatient Prescriptions Prior to Visit  Medication Sig Dispense Refill  . meloxicam (MOBIC) 15 MG tablet TAKE ONE TABLET BY MOUTH ONCE DAILY  90 tablet  3  . simvastatin (ZOCOR) 20 MG tablet Take one tablet by mouth once daily to lower cholesterol  90 tablet  3  . diptheria-tetanus toxoids (DECAVAC) 2-2 LF/0.5ML injection Inject 0.5 mLs into the muscle once.  0.5 mL  0   No facility-administered medications prior to visit.       Review of Systems  Constitutional: Negative for fever and unexpected weight change.  HENT: Negative for congestion, dental problem, ear pain, nosebleeds, postnasal drip, rhinorrhea, sinus pressure, sneezing, sore throat and trouble swallowing.   Eyes: Negative for redness and itching.  Respiratory: Positive for cough and shortness of breath. Negative for chest tightness and wheezing.   Cardiovascular: Negative for palpitations and leg swelling.  Gastrointestinal: Negative for nausea and vomiting.  Genitourinary: Negative for dysuria.  Musculoskeletal: Negative for joint swelling.  Skin: Negative for rash.  Neurological: Negative for headaches.  Hematological: Does not bruise/bleed easily.  Psychiatric/Behavioral: Negative for dysphoric mood. The patient is not nervous/anxious.        Objective:   Physical Exam  Nursing note and vitals reviewed. Constitutional: He is oriented to person, place, and time. He appears well-developed and well-nourished. No distress.  HENT:  Head: Normocephalic and atraumatic.  Right Ear: External ear normal.  Left Ear: External ear normal.  Mouth/Throat: Oropharynx is clear and moist. No oropharyngeal exudate.  Eyes: Conjunctivae and EOM are normal. Pupils are equal, round, and reactive to light. Right eye exhibits no discharge. Left eye exhibits no discharge. No scleral icterus.  Neck: Normal range of motion. Neck supple. No JVD present. No tracheal deviation  present. No thyromegaly present.  Cardiovascular: Normal rate, regular rhythm and intact distal pulses.  Exam reveals no gallop and no friction rub.   No murmur heard. Pulmonary/Chest: Effort normal and breath sounds normal. No respiratory distress. He has no wheezes. He has no rales. He exhibits no tenderness.  Abdominal: Soft. Bowel sounds are normal. He exhibits no distension and no mass. There is no tenderness. There is no rebound and no guarding.  Musculoskeletal: Normal range of motion. He exhibits no edema and no tenderness.  Lymphadenopathy:    He has no cervical adenopathy.  Neurological: He is alert and oriented to person, place, and time. He has normal reflexes. No cranial nerve deficit. Coordination normal.  Skin: Skin is warm and dry. No rash noted. He is not diaphoretic. No erythema. No pallor.  Psychiatric: He has a normal mood and affect. His behavior is normal. Judgment and thought content normal.          Assessment & Plan:   

## 2012-11-26 NOTE — Patient Instructions (Signed)
Do PET Scan ASAP WE will provisionally schedule for bronchoscopy under General anestehsia  - procedure called EBUS 12/15/12 at 7.30am  - I will explain this in more detail after PET scan results

## 2012-11-28 ENCOUNTER — Encounter (HOSPITAL_COMMUNITY): Payer: Self-pay | Admitting: *Deleted

## 2012-11-28 ENCOUNTER — Telehealth: Payer: Self-pay | Admitting: Internal Medicine

## 2012-11-28 NOTE — Telephone Encounter (Signed)
kristi called back from [employer]---they need to know a few things about the PET--  1.  Has there been a biopsy done? 2.  Is this test being done to decide if this is the optimal site for the biopsy? 3.  Is this being requested for a strong suspicion of a solid tumor? 4.  Is this for a lung nodule? 5.  What is the size of this area?   MR please advise so we can call kristi back with [facility] so they are able to review to see if PET will be covered for the pt.  thanks

## 2012-11-28 NOTE — Telephone Encounter (Signed)
lmomtcb for Steve [name] with [employer].

## 2012-11-28 NOTE — Telephone Encounter (Signed)
Answers  - Has a biopsy been done - NO not done - IS test being done to decide optimal site - Yes that is one of the reasons, Other being staging and to biopsy highest stage site - Is test being requested for strong suspicion of solid tumor - YES, He has 6.2cm Right lower lobe lung  mas with 4.7 cm subcarinal adenopathy and 2cm Rt hilar node - Is this for a lung nodule - YES, and a lung mass - What is size - 6.2cm RLL mass with 4.7cm subcarinal adenopathy   - Please give CT report below  Dr. Kalman Shan, M.D., [facility].C.P Pulmonary and Critical Care Medicine Staff Physician [place] [facility] [place] Pulmonary and Critical Care Pager: [phone], If no answer or between  15:00h - 7:00h: call [phone]  11/28/2012 5:28 PM       CLINICAL DATA: Right lower lobe collapse on chest x-ray.  EXAM:  CT CHEST WITH CONTRAST  TECHNIQUE:  Multidetector CT imaging of the chest was performed during  intravenous contrast administration.  CONTRAST: 75mL OMNIPAQUE IOHEXOL 300 MG/ML SOLN  COMPARISON: Chest x-ray 11/19/2012  FINDINGS:  There is bulky right hilar and subcarinal adenopathy. Occlusion of  the right bronchus intermedius by a central right hilar/ infrahilar  mass. This is very difficult to measure due to the chole less since  with adenopathy and postobstructive collapse in the right lower  lobe, but the area measures approximately 6.2 x 4.7 cm on image 43  of series 3. Associated collapse/ atelectasis in the right lower  lobe as seen on chest x-ray. Large supple carina adenopathy measures  4.7 x 2.9 cm. Right hilar adenopathy on image 35 measures 2.0 x 2.0  cm. High right peritracheal lymph node has a short axis diameter of  8 mm. No left hilar adenopathy. No axillary adenopathy.  Densely calcified coronary arteries diffusely. Heart is normal size.  Aorta is normal caliber.  No pleural effusions. Left lung is clear. Chest wall soft tissues  are unremarkable. Imaging  into the upper abdomen shows no acute  findings. No acute bony abnormality.  IMPRESSION:  Large right hilar/ infrahilar mass obstructing the bronchus  intermedius with postobstructive process, likely atelectasis in the  right lower lobe. Associated bulky right hilar and subcarinal  adenopathy. Findings most consistent with primary lung cancer.  Coronary artery disease.  Electronically Signed  By: Charlett Nose M.D.  On: 11/24/2012 14:40

## 2012-11-30 NOTE — Telephone Encounter (Signed)
noted 

## 2012-11-30 NOTE — Assessment & Plan Note (Signed)
Likely lung cancer  PLAN Do PET Scan ASAP WE will provisionally schedule for bronchoscopy under General anestehsia  - procedure called EBUS 12/15/12 at 7.30am  - I will explain this in more detail after PET scan results

## 2012-12-01 NOTE — Telephone Encounter (Signed)
LMTCBx1 to give Kristi the information below. Carron Curie, CMA

## 2012-12-02 ENCOUNTER — Encounter: Payer: Self-pay | Admitting: Internal Medicine

## 2012-12-02 ENCOUNTER — Encounter (HOSPITAL_COMMUNITY): Payer: Self-pay | Admitting: Pharmacist

## 2012-12-02 NOTE — Telephone Encounter (Signed)
LMTCBx2 to give Silva Bandy the information below. Carron Curie, CMA

## 2012-12-03 ENCOUNTER — Encounter (HOSPITAL_COMMUNITY): Payer: Self-pay

## 2012-12-03 ENCOUNTER — Encounter (HOSPITAL_COMMUNITY)
Admission: RE | Admit: 2012-12-03 | Discharge: 2012-12-03 | Disposition: A | Discharge status: Home / Self Care | Payer: Medicare Other | Source: Ambulatory Visit | Attending: Internal Medicine | Admitting: Internal Medicine

## 2012-12-03 DIAGNOSIS — R918 Other nonspecific abnormal finding of lung field: Secondary | ICD-10-CM

## 2012-12-03 DIAGNOSIS — I700 Atherosclerosis of aorta: Secondary | ICD-10-CM | POA: Insufficient documentation

## 2012-12-03 DIAGNOSIS — R222 Localized swelling, mass and lump, trunk: Secondary | ICD-10-CM | POA: Insufficient documentation

## 2012-12-03 DIAGNOSIS — C7800 Secondary malignant neoplasm of unspecified lung: Secondary | ICD-10-CM | POA: Insufficient documentation

## 2012-12-03 LAB — GLUCOSE, CAPILLARY: Glucose-Capillary: 100 mg/dL — ABNORMAL HIGH (ref 70–99)

## 2012-12-03 MED ORDER — FLUDEOXYGLUCOSE F - 18 (FDG) INJECTION
18.9000 | Freq: Once | INTRAVENOUS | Status: AC | PRN
  Administered 2012-12-03: 18.9 via INTRAVENOUS

## 2012-12-03 NOTE — Telephone Encounter (Signed)
LMTCBx3 for Steve [name]. Carron Curie, CMA

## 2012-12-05 NOTE — Telephone Encounter (Signed)
LMTCBx4. Jennifer Castillo, CMA  

## 2012-12-08 ENCOUNTER — Telehealth: Payer: Self-pay | Admitting: Internal Medicine

## 2012-12-08 NOTE — Telephone Encounter (Signed)
Gave patient PET scan results. Planned for EBUS 12/15/12. I have called Michelled in [place] [phone] and given her the go ahead.  He is due for conoloscopy 12/24/12 with Dr Jarold Motto; wants to know wuit PET scan not showing activity in colon if he stil needs colonosciopy. Will  Forward to Dr Jarold Motto for opinion on this  Dr. Kalman Shan, M.D., [facility].C.P Pulmonary and Critical Care Medicine Staff Physician [place] [facility] [place] Pulmonary and Critical Care Pager: [phone], If no answer or between  15:00h - 7:00h: call [phone]  12/08/2012 1:49 PM

## 2012-12-08 NOTE — Telephone Encounter (Signed)
See Dr. Philipp Deputy response.  I have cancelled the colonoscopy for 12/24/12 and notified the patient.

## 2012-12-08 NOTE — Telephone Encounter (Signed)
Cancel his colonoscopy per lung cancer DX,

## 2012-12-08 NOTE — Telephone Encounter (Signed)
12/01/12 6 pm- tried calling patient. No answer/ busy  12/03/12 11 am- no answer/ busy  12/08/12 11:00 am-Patient answered the phone. He had his PET scan last Wednesday and would like to know the result. He mentions his breathing to be stable. No chest pain or pain elsewhere. Has bronchoscopy on 12/15/12. He mentions understanding that this most likely is cancer of his lung. He mentions having good support system and that he is coping well at present. I informed patient that I will call him after reviewing his PET scan report  12/08/12 4:30 pm - reviewed PET scan report and called patient. No spread of cancer elsewhere besides his lung. We will need tissue biospy to confirm the type of cancer. Patient mentions that his lung doctor has called him and went over the result as well. I have told patient to let me or the office know if we can be of any additional help during his tests and treatment process.

## 2012-12-09 ENCOUNTER — Telehealth: Payer: Self-pay | Admitting: Internal Medicine

## 2012-12-09 ENCOUNTER — Telehealth: Payer: Self-pay | Admitting: *Deleted

## 2012-12-09 NOTE — Telephone Encounter (Signed)
i spoke with the patient yesterday. thanks

## 2012-12-09 NOTE — Telephone Encounter (Signed)
LMTCB again. I advised if nothing further is needed to please call us back and let us know. Carron Curie, CMA

## 2012-12-09 NOTE — Telephone Encounter (Signed)
Wilkie Aye from [facility] states that case has been approved for PET from skull to mid thigh on 12/02/12 & expires on 01/16/13.  Approval # is [id number].  Wilkie Aye can be reached at [phone].  Steve [name]

## 2012-12-09 NOTE — Telephone Encounter (Signed)
Patient called wanting his PET Scan results. Printed results and called back patient to let him know I was going to leave for our provider to review and call  Him back. The scan was listed under Dr. Marchelle Gearing. But patient stated that he had already gotten his results and have spoken with Dr. Glade Lloyd twice. Patient stated he was told that everything was fine with the test results.

## 2012-12-09 NOTE — Telephone Encounter (Signed)
LMTCB

## 2012-12-10 NOTE — Telephone Encounter (Signed)
I provided the pt spouse with the contact number to pulm dept so they can get all information they need about EBUS. Carron Curie, CMA

## 2012-12-10 NOTE — Telephone Encounter (Signed)
Per protocol I will sign off on message. Avery Eustice, CMA  

## 2012-12-11 ENCOUNTER — Encounter (HOSPITAL_COMMUNITY): Admission: RE | Admit: 2012-12-11 | Payer: Medicare Other | Source: Ambulatory Visit

## 2012-12-12 ENCOUNTER — Telehealth: Payer: Self-pay | Admitting: Gastroenterology

## 2012-12-12 ENCOUNTER — Telehealth: Payer: Self-pay | Admitting: Internal Medicine

## 2012-12-12 NOTE — Telephone Encounter (Signed)
(  continued)  Okey Regal tried to page/call MR regarding this.  Okey Regal states that hey do call for bronchs-but not for ebus.  Again, short stay advised that it is up to the doctor to call.  Okey Regal states that MR didn't give any directions on when to stop the med or anything.  Please help.  Antionette Fairy

## 2012-12-12 NOTE — Telephone Encounter (Signed)
Okey Regal from [facility] paged/called MR but he did not respond.  i have called MR and lmom but no call back before we left.  i spoke with [facility] and he stated that pt should be ok on mobic, as long as he is not taking aspirin.  Aspirin is not listed on pts med list.  Will forward message to MR to follow up .

## 2012-12-12 NOTE — Telephone Encounter (Signed)
Cancelled pre-visit.  Colonoscopy was already cancelled per Darcey Nora.  Dr Jarold Motto had her cancel it due to pt's lung cancer diagnosis.

## 2012-12-14 NOTE — Telephone Encounter (Signed)
Agree with Dr Shelle Iron. No need to stop mobic  Dr. Kalman Shan, M.D., [facility].C.P Pulmonary and Critical Care Medicine Staff Physician [place] [facility] [place] Pulmonary and Critical Care Pager: [phone], If no answer or between  15:00h - 7:00h: call [phone]  12/14/2012 5:29 AM

## 2012-12-15 ENCOUNTER — Encounter (HOSPITAL_COMMUNITY): Payer: Self-pay

## 2012-12-15 ENCOUNTER — Ambulatory Visit (HOSPITAL_COMMUNITY)
Admission: RE | Admit: 2012-12-15 | Discharge: 2012-12-15 | Disposition: A | Discharge status: Home / Self Care | Payer: Medicare Other | Source: Ambulatory Visit | Attending: Internal Medicine | Admitting: Internal Medicine

## 2012-12-15 ENCOUNTER — Ambulatory Visit (HOSPITAL_COMMUNITY): Payer: Medicare Other | Admitting: Anesthesiology

## 2012-12-15 ENCOUNTER — Encounter (HOSPITAL_COMMUNITY): Payer: Medicare Other | Admitting: Anesthesiology

## 2012-12-15 ENCOUNTER — Telehealth: Payer: Self-pay | Admitting: Internal Medicine

## 2012-12-15 ENCOUNTER — Encounter (HOSPITAL_COMMUNITY)
Admission: RE | Disposition: A | Discharge status: Home / Self Care | Payer: Self-pay | Source: Ambulatory Visit | Attending: Internal Medicine

## 2012-12-15 DIAGNOSIS — Z87891 Personal history of nicotine dependence: Secondary | ICD-10-CM | POA: Insufficient documentation

## 2012-12-15 DIAGNOSIS — Z8551 Personal history of malignant neoplasm of bladder: Secondary | ICD-10-CM | POA: Insufficient documentation

## 2012-12-15 DIAGNOSIS — R222 Localized swelling, mass and lump, trunk: Secondary | ICD-10-CM

## 2012-12-15 DIAGNOSIS — C771 Secondary and unspecified malignant neoplasm of intrathoracic lymph nodes: Secondary | ICD-10-CM | POA: Insufficient documentation

## 2012-12-15 DIAGNOSIS — Z79899 Other long term (current) drug therapy: Secondary | ICD-10-CM | POA: Insufficient documentation

## 2012-12-15 DIAGNOSIS — C3430 Malignant neoplasm of lower lobe, unspecified bronchus or lung: Secondary | ICD-10-CM | POA: Insufficient documentation

## 2012-12-15 DIAGNOSIS — E785 Hyperlipidemia, unspecified: Secondary | ICD-10-CM | POA: Insufficient documentation

## 2012-12-15 DIAGNOSIS — E1365 Other specified diabetes mellitus with hyperglycemia: Secondary | ICD-10-CM | POA: Insufficient documentation

## 2012-12-15 DIAGNOSIS — R918 Other nonspecific abnormal finding of lung field: Secondary | ICD-10-CM

## 2012-12-15 DIAGNOSIS — IMO0002 Reserved for concepts with insufficient information to code with codable children: Secondary | ICD-10-CM | POA: Insufficient documentation

## 2012-12-15 HISTORY — DX: Chronic obstructive pulmonary disease, unspecified: J44.9

## 2012-12-15 HISTORY — DX: Hypothyroidism, unspecified: E03.9

## 2012-12-15 HISTORY — DX: Type 2 diabetes mellitus without complications: E11.9

## 2012-12-15 HISTORY — PX: ENDOBRONCHIAL ULTRASOUND: SHX5096

## 2012-12-15 SURGERY — ENDOBRONCHIAL ULTRASOUND (EBUS)
Anesthesia: General | Laterality: Bilateral

## 2012-12-15 MED ORDER — FENTANYL CITRATE 0.05 MG/ML IJ SOLN
INTRAMUSCULAR | Status: DC | PRN
  Administered 2012-12-15 (×2): 50 ug via INTRAVENOUS

## 2012-12-15 MED ORDER — PROMETHAZINE HCL 25 MG/ML IJ SOLN: 6.2500 mg | INTRAMUSCULAR | Status: DC | PRN

## 2012-12-15 MED ORDER — LIDOCAINE HCL (CARDIAC) 20 MG/ML IV SOLN
INTRAVENOUS | Status: DC | PRN
  Administered 2012-12-15: 50 mg via INTRAVENOUS

## 2012-12-15 MED ORDER — PROPOFOL 10 MG/ML IV BOLUS
INTRAVENOUS | Status: DC | PRN
  Administered 2012-12-15 (×2): 20 mg via INTRAVENOUS
  Administered 2012-12-15 (×2): 50 mg via INTRAVENOUS
  Administered 2012-12-15: 180 mg via INTRAVENOUS

## 2012-12-15 MED ORDER — PHENYLEPHRINE HCL 10 MG/ML IJ SOLN
INTRAMUSCULAR | Status: DC | PRN
  Administered 2012-12-15: 40 ug via INTRAVENOUS
  Administered 2012-12-15 (×2): 80 ug via INTRAVENOUS

## 2012-12-15 MED ORDER — FENTANYL CITRATE 0.05 MG/ML IJ SOLN
INTRAMUSCULAR | Status: AC
  Filled 2012-12-15: qty 2

## 2012-12-15 MED ORDER — PROPOFOL 10 MG/ML IV BOLUS
INTRAVENOUS | Status: AC
  Filled 2012-12-15: qty 20

## 2012-12-15 MED ORDER — EPINEPHRINE HCL 0.1 MG/ML IJ SOSY
PREFILLED_SYRINGE | INTRAMUSCULAR | Status: DC | PRN
  Administered 2012-12-15: 0.2 mg via ENDOTRACHEOPULMONARY

## 2012-12-15 MED ORDER — FENTANYL CITRATE 0.05 MG/ML IJ SOLN: 25.0000 ug | INTRAMUSCULAR | Status: DC | PRN

## 2012-12-15 MED ORDER — MIDAZOLAM HCL 2 MG/2ML IJ SOLN
INTRAMUSCULAR | Status: AC
  Filled 2012-12-15: qty 2

## 2012-12-15 MED ORDER — LACTATED RINGERS IV SOLN
INTRAVENOUS | Status: DC
  Administered 2012-12-15: 08:00:00 via INTRAVENOUS
  Administered 2012-12-15: 1000 mL via INTRAVENOUS

## 2012-12-15 MED ORDER — MIDAZOLAM HCL 5 MG/5ML IJ SOLN
INTRAMUSCULAR | Status: DC | PRN
  Administered 2012-12-15 (×2): 1 mg via INTRAVENOUS

## 2012-12-15 MED ORDER — LIDOCAINE HCL (CARDIAC) 20 MG/ML IV SOLN
INTRAVENOUS | Status: AC
  Filled 2012-12-15: qty 5

## 2012-12-15 MED ORDER — SUCCINYLCHOLINE CHLORIDE 20 MG/ML IJ SOLN
INTRAMUSCULAR | Status: AC
  Filled 2012-12-15: qty 1

## 2012-12-15 MED ORDER — LIDOCAINE HCL 1 % IJ SOLN
INTRAMUSCULAR | Status: DC | PRN
  Administered 2012-12-15: 5 mL

## 2012-12-15 MED ORDER — PHENYLEPHRINE 40 MCG/ML (10ML) SYRINGE FOR IV PUSH (FOR BLOOD PRESSURE SUPPORT)
PREFILLED_SYRINGE | INTRAVENOUS | Status: AC
  Filled 2012-12-15: qty 10

## 2012-12-15 MED ORDER — SUCCINYLCHOLINE CHLORIDE 20 MG/ML IJ SOLN
INTRAMUSCULAR | Status: DC | PRN
  Administered 2012-12-15: 100 mg via INTRAVENOUS

## 2012-12-15 NOTE — Op Note (Signed)
Name:  Steve [name] MRN:  [id number] DOB:  06/16/1940  PROCEDURE NOTE  Procedure(s): Flexible bronchoscopy [phone]) Bronchial alveolar lavage [phone]) of the Rt Lower Lobe  Endobronchial biopsy (82956) of the Rt ight lower lobe endobrnchial lesion Endobronchial ultrasound (21308) Transbronchial needle aspiration (65784) of the  10R and STATION 7  Indications:  Hilar / mediastinal lymphadenopathy. And Lung mass  Consent:  Procedure, benefits, risks and alternatives discussed.  Questions answered.  Consent obtained.  Anesthesia:  General endotracheal.  Procedure summary:  Appropriate equipment was assembled.  The patient was brought to the operating room and identified as Steve [name].  Safety timeout was performed. The patient was placed supine on the operating table, airway established and general anesthesia administered by Anesthesia team.   After the appropriate level of anesthesia was assured, flexible video bronchoscope was lubricated and inserted through the endotracheal tube.  Total of5 mL of 1% Lidocaine were administered through the bronchoscope to augment general anesthesia.  Airway examination was performed bilaterally to subsegmental level.  Minimal clear secretions were noted, mucosa appeared normal and no endobronchial lesions were identified EXCEPT RIGHT LOWER LOBE 2.5CM DISTAL TO CARINA WAS COMPLETELY OCCLUDED BY POLYPOID, ROUGH, FRIABLE ENDOBRONCHIAL LESION  Endobronchial ultrasound video bronchoscope was then lubricated and inserted through the endotracheal tube. Surveillance of the mediastinal and and bilateral hilar lymph node stations was performed.  Pathologically enlarged lymph nodes were noted IN STATION 7 AND 10R  Endobronchial ultrasound guided transbronchial needle aspiration of 10R (passes X 2 FOR SLIDE AND LATER X 2 FOR CYTOLYTE), STATION 7 (passes X 1 FOR SLIDE AND LATER X 1 FOR CYTOLYTE)  WAS DONE after which EBUS bronchoscope was withdrawn.  AFTER THIS  FLEXIBLE VIDEO BRONCH WAS REINTRODUCED AND  ENDOBRONCHIAL BIOPSY X 5 OF RIGHT LOWER LOBE MASS WAS PERFORMED.   AT END OF THIS DR LI FROM CTYOLOGY CALLED WITH PRELIM DIAGNOSIS OF SMALL CELL LUNG CANCER  Bronchial alveolar lavage of the RIGHT LOWER LOBEwas performed with 90mL of normal saline and return of 40 mL of fluid, after which the bronchoscope was withdrawn.   The patient was extubated in operating room and transferred to PACU. Post-procedure chest x-ray was ordered.  Specimens sent: Bronchial alveolar lavage specimen of the RIGHT LOWER LOBE FOR  Cytology EBUS TBNA AOF STATION 7 AND 10R NODES ENDOBRONCHIAL BIOPSY OF THE RIGHT LOWER LOBE.  Complications:  No immediate complications were noted.  Hemodynamic parameters and oxygenation remained stable throughout the procedure.  Estimated blood loss:  Less then 5 mL.   IMPRESSION PRELIM: SMALL CELL LUNG CANCER  POST OP PLAN - WIFE WILL BE UPDATED - FOLLOWUP WILL BE GIVEN    Dr. Kalman Shan, M.D., [facility].C.P Pulmonary and Critical Care Medicine Staff Physician [place] [facility]  Pulmonary and Critical Care Pager: [phone], If no answer or between  15:00h - 7:00h: call [phone]  12/15/2012 8:41 AM

## 2012-12-15 NOTE — Telephone Encounter (Signed)
Pt advised and appt set. Kimani Bedoya, CMA  

## 2012-12-15 NOTE — Interval H&P Note (Signed)
History and Physical Interval Note:  12/15/2012 7:34 AM  Steve [name]  has presented today for surgery, with the diagnosis of Lung Mass  The various methods of treatment have been discussed with the patient and family. After consideration of risks, benefits and other options for treatment, the patient has consented to  Procedure(s): ENDOBRONCHIAL ULTRASOUND (Bilateral) as a surgical intervention .  The patient's history has been reviewed, patient examined, no change in status, stable for surgery.  I have reviewed the patient's chart and labs.  Questions were answered to the patient's satisfaction.     Dr. Kalman Shan, M.D., [facility].C.P Pulmonary and Critical Care Medicine Staff Physician [place] [facility] [place] Pulmonary and Critical Care Pager: [phone], If no answer or between  15:00h - 7:00h: call [phone]  12/15/2012 7:35 AM

## 2012-12-15 NOTE — Progress Notes (Signed)
Video bronchoscopy performed following EBUS  Intervention bronchial biopsy Intervention bronchial washing Pt tolerated well  Jacqulynn Cadet RRT

## 2012-12-15 NOTE — Anesthesia Postprocedure Evaluation (Signed)
  Anesthesia Post-op Note  Patient: Steve [name]  Procedure(s) Performed: Procedure(s) (LRB): ENDOBRONCHIAL ULTRASOUND (Bilateral)  Patient Location: PACU  Anesthesia Type: General  Level of Consciousness: awake and alert   Airway and Oxygen Therapy: Patient Spontanous Breathing  Post-op Pain: mild  Post-op Assessment: Post-op Vital signs reviewed, Patient's Cardiovascular Status Stable, Respiratory Function Stable, Patent Airway and No signs of Nausea or vomiting  Last Vitals:  Filed Vitals:   12/15/12 0915  BP: 121/78  Pulse:   Temp:   Resp: 14    Post-op Vital Signs: stable   Complications: No apparent anesthesia complications

## 2012-12-15 NOTE — Transfer of Care (Signed)
Immediate Anesthesia Transfer of Care Note  Patient: Steve [name]  Procedure(s) Performed: Procedure(s): ENDOBRONCHIAL ULTRASOUND (Bilateral)  Patient Location: PACU  Anesthesia Type:General  Level of Consciousness: awake, alert  and oriented  Airway & Oxygen Therapy: Patient Spontanous Breathing and Patient connected to face mask oxygen  Post-op Assessment: Report given to PACU RN and Post -op Vital signs reviewed and stable  Post vital signs: Reviewed and stable  Complications: No apparent anesthesia complications

## 2012-12-15 NOTE — Telephone Encounter (Signed)
Steve [name]  Please give fu appt with me or Tammy to gov over bronch results. Give fu wed PM 12/17/12  Dr. Kalman Shan, M.D., [facility].C.P Pulmonary and Critical Care Medicine Staff Physician [place] [facility] [place] Pulmonary and Critical Care Pager: [phone], If no answer or between  15:00h - 7:00h: call [phone]  12/15/2012 9:40 AM

## 2012-12-15 NOTE — Anesthesia Preprocedure Evaluation (Addendum)
Anesthesia Evaluation  Patient identified by MRN, date of birth, ID band Patient awake    Reviewed: Allergy & Precautions, H&P , NPO status , Patient's Chart, lab work & pertinent test results  Airway Mallampati: II TM Distance: >3 FB Neck ROM: Full    Dental no notable dental hx.    Pulmonary former smoker,  breath sounds clear to auscultation  Pulmonary exam normal       Cardiovascular negative cardio ROS  Rhythm:Regular Rate:Normal     Neuro/Psych negative neurological ROS  negative psych ROS   GI/Hepatic negative GI ROS, Neg liver ROS,   Endo/Other  diabetesHypothyroidism   Renal/GU negative Renal ROS  negative genitourinary   Musculoskeletal negative musculoskeletal ROS (+)   Abdominal   Peds negative pediatric ROS (+)  Hematology negative hematology ROS (+)   Anesthesia Other Findings   Reproductive/Obstetrics negative OB ROS                          Anesthesia Physical Anesthesia Plan  ASA: III  Anesthesia Plan: General   Post-op Pain Management:    Induction: Intravenous  Airway Management Planned: Oral ETT  Additional Equipment:   Intra-op Plan:   Post-operative Plan: Extubation in OR  Informed Consent: I have reviewed the patients History and Physical, chart, labs and discussed the procedure including the risks, benefits and alternatives for the proposed anesthesia with the patient or authorized representative who has indicated his/her understanding and acceptance.   Dental advisory given  Plan Discussed with: CRNA and Surgeon  Anesthesia Plan Comments:         Anesthesia Quick Evaluation

## 2012-12-15 NOTE — H&P (View-Only) (Signed)
Subjective:    Patient ID: Steve [name], male    DOB: 12-10-1940, 72 y.o.   MRN: [id number]  PCP Oneal Grout, MD   HPI  IOV 11/26/2012  Chief Complaint  Patient presents with  . Pulmonary Consult    abnormal CT scan. Pt states he had a cxr for physical and it was abnormal so they did a CT. Pt states he has SOB with exertion.  Pt also states he has a productive cough with blood tinged mucus.    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 Reported as primary care physician with subacute history of cough and wheezing for several weeks. Did not improve with inhaler therapy. A chest x-ray was done this was followed by CT scan of the chest 2 days ago 11/24/2012 that shows a right-sided lung mass encasing the right main bronchus and in the subcarinal region. Therefore he is been referred here. In the last few days he's had some streaky hemoptysis. CT scan is reviewed personally and listed below. His nose is in weight loss or chest pain no wheezing or  Lung mass relevant history   reports that he quit smoking about 5 years ago. His smoking use included Cigarettes. He has a 75 pack-year smoking history. His smokeless tobacco use includes Chew.  Remote history of bladder cancer in complete remission   CT 11/24/12   CLINICAL DATA: Right lower lobe collapse on chest x-ray.  EXAM:  CT CHEST WITH CONTRAST  TECHNIQUE:  Multidetector CT imaging of the chest was performed during  intravenous contrast administration.  CONTRAST: 75mL OMNIPAQUE IOHEXOL 300 MG/ML SOLN  COMPARISON: Chest x-ray 11/19/2012  FINDINGS:  There is bulky right hilar and subcarinal adenopathy. Occlusion of  the right bronchus intermedius by a central right hilar/ infrahilar  mass. This is very difficult to measure due to the chole less since  with adenopathy and postobstructive collapse in the right lower  lobe, but the area measures approximately 6.2 x 4.7 cm on image 43  of  series 3. Associated collapse/ atelectasis in the right lower  lobe as seen on chest x-ray. Large supple carina adenopathy measures  4.7 x 2.9 cm. Right hilar adenopathy on image 35 measures 2.0 x 2.0  cm. High right peritracheal lymph node has a short axis diameter of  8 mm. No left hilar adenopathy. No axillary adenopathy.  Densely calcified coronary arteries diffusely. Heart is normal size.  Aorta is normal caliber.  No pleural effusions. Left lung is clear. Chest wall soft tissues  are unremarkable. Imaging into the upper abdomen shows no acute  findings. No acute bony abnormality.  IMPRESSION:  Large right hilar/ infrahilar mass obstructing the bronchus  intermedius with postobstructive process, likely atelectasis in the  right lower lobe. Associated bulky right hilar and subcarinal  adenopathy. Findings most consistent with primary lung cancer.  Coronary artery disease.  Electronically Signed  By: Charlett Nose M.D.  On: 11/24/2012 14:40   Past Medical History  Diagnosis Date  . Thyroid disease   . Secondary diabetes mellitus with unspecified complication, uncontrolled   . Dizziness and giddiness   . Malignant neoplasm of bladder, part unspecified   . Hyperlipidemia   . Impotence of organic origin   . Osteoarthritis, shoulder   . Allergy      Family History  Problem Relation Age of Onset  . Kidney disease Brother      History   Social History  . Marital Status: Married  Spouse Name: N/A    Number of Children: N/A  . Years of Education: N/A   Occupational History  . business owner    Social History Main Topics  . Smoking status: Former Smoker -- 1.50 packs/day for 50 years    Types: Cigarettes    Quit date: 01/09/2007  . Smokeless tobacco: Current User    Types: Chew  . Alcohol Use: Yes     Comment: 4-5 beers a month, 1 mixed drink every 2 months  . Drug Use: No  . Sexual Activity: Not on file   Other Topics Concern  . Not on file   Social History  Narrative  . No narrative on file     No Known Allergies   Outpatient Prescriptions Prior to Visit  Medication Sig Dispense Refill  . meloxicam (MOBIC) 15 MG tablet TAKE ONE TABLET BY MOUTH ONCE DAILY  90 tablet  3  . simvastatin (ZOCOR) 20 MG tablet Take one tablet by mouth once daily to lower cholesterol  90 tablet  3  . diptheria-tetanus toxoids (DECAVAC) 2-2 LF/0.5ML injection Inject 0.5 mLs into the muscle once.  0.5 mL  0   No facility-administered medications prior to visit.       Review of Systems  Constitutional: Negative for fever and unexpected weight change.  HENT: Negative for congestion, dental problem, ear pain, nosebleeds, postnasal drip, rhinorrhea, sinus pressure, sneezing, sore throat and trouble swallowing.   Eyes: Negative for redness and itching.  Respiratory: Positive for cough and shortness of breath. Negative for chest tightness and wheezing.   Cardiovascular: Negative for palpitations and leg swelling.  Gastrointestinal: Negative for nausea and vomiting.  Genitourinary: Negative for dysuria.  Musculoskeletal: Negative for joint swelling.  Skin: Negative for rash.  Neurological: Negative for headaches.  Hematological: Does not bruise/bleed easily.  Psychiatric/Behavioral: Negative for dysphoric mood. The patient is not nervous/anxious.        Objective:   Physical Exam  Nursing note and vitals reviewed. Constitutional: He is oriented to person, place, and time. He appears well-developed and well-nourished. No distress.  HENT:  Head: Normocephalic and atraumatic.  Right Ear: External ear normal.  Left Ear: External ear normal.  Mouth/Throat: Oropharynx is clear and moist. No oropharyngeal exudate.  Eyes: Conjunctivae and EOM are normal. Pupils are equal, round, and reactive to light. Right eye exhibits no discharge. Left eye exhibits no discharge. No scleral icterus.  Neck: Normal range of motion. Neck supple. No JVD present. No tracheal deviation  present. No thyromegaly present.  Cardiovascular: Normal rate, regular rhythm and intact distal pulses.  Exam reveals no gallop and no friction rub.   No murmur heard. Pulmonary/Chest: Effort normal and breath sounds normal. No respiratory distress. He has no wheezes. He has no rales. He exhibits no tenderness.  Abdominal: Soft. Bowel sounds are normal. He exhibits no distension and no mass. There is no tenderness. There is no rebound and no guarding.  Musculoskeletal: Normal range of motion. He exhibits no edema and no tenderness.  Lymphadenopathy:    He has no cervical adenopathy.  Neurological: He is alert and oriented to person, place, and time. He has normal reflexes. No cranial nerve deficit. Coordination normal.  Skin: Skin is warm and dry. No rash noted. He is not diaphoretic. No erythema. No pallor.  Psychiatric: He has a normal mood and affect. His behavior is normal. Judgment and thought content normal.          Assessment & Plan:

## 2012-12-15 NOTE — Interval H&P Note (Signed)
History and Physical Interval Note:  12/15/2012 7:33 AM  Steve [name]  has presented today for surgery, with the diagnosis of Lung Mass  The various methods of treatment have been discussed with the patient and family. After consideration of risks, benefits and other options for treatment, the patient has consented to  Procedure(s): ENDOBRONCHIAL ULTRASOUND (Bilateral) as a surgical intervention .  The patient's history has been reviewed, patient examined, no change in status, stable for surgery.  I have reviewed the patient's chart and labs.  Questions were answered to the patient's satisfaction.     [name]

## 2012-12-15 NOTE — H&P (Signed)
  See my notes frm 11/26/12  Sicne then he has had PET scan which shows uptake in the Rt side. Resulsts were discussed with huim. High pre-test prob for lung cancer. Since then as of 12/15/2012 no new health issues. He remains NPO. Denies chest pain, wheeze, hemoptysis (though can taste "blood")., dyspnea. NPO confirmed. He is ok with discussing results with wife  Vitals - stable  Exam  - non focal  Risks of pneumothorax, hemothorax, sedation/anesthesia complications such as cardiac or respiratory arrest or hypotension, stroke and bleeding all explained. Benefits of diagnosis but limitations of non-diagnosis also explained. Patient verbalized understanding and wished to proceed.    Dr. Kalman Shan, M.D., [facility].C.P Pulmonary and Critical Care Medicine Staff Physician [facility] and Critical [facility] Pager: [phone], If no answer or between  15:00h - 7:00h: call [phone]  12/15/2012 7:30 AM

## 2012-12-16 NOTE — Progress Notes (Signed)
Dr. Kalman Shan, M.D., [facility].C.P Pulmonary and Critical Care Medicine Staff Physician [facility] [place] Pulmonary and Critical Care Pager: [phone], If no answer or between  15:00h - 7:00h: call [phone]  12/16/2012 7:56 AM

## 2012-12-17 ENCOUNTER — Ambulatory Visit (INDEPENDENT_AMBULATORY_CARE_PROVIDER_SITE_OTHER): Payer: Medicare Other | Admitting: Internal Medicine

## 2012-12-17 ENCOUNTER — Encounter (HOSPITAL_COMMUNITY): Payer: Self-pay | Admitting: Internal Medicine

## 2012-12-17 DIAGNOSIS — C3490 Malignant neoplasm of unspecified part of unspecified bronchus or lung: Secondary | ICD-10-CM

## 2012-12-17 LAB — CULTURE, BAL-QUANTITATIVE W GRAM STAIN
Culture: NO GROWTH
Gram Stain: NONE SEEN

## 2012-12-17 LAB — CULTURE, BAL-QUANTITATIVE

## 2012-12-17 NOTE — Progress Notes (Signed)
   Subjective:    Patient ID: Steve [name], male    DOB: Dec 26, 1940, 72 y.o.   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 He stage TII B. on 6 cm mass that is 2.5 cm distal to the carina with right lower lobe collapse, N2 disease some subcarinal station and no evidence of distant metastasis. Stage III A.   Here to discuss results with wife. Lot of questions o staging, cure rate, prognosis, life quality, expectancyl. Rx. Wants surgery Review of Systems  Constitutional: Negative for fever and unexpected weight change.  HENT: Positive for congestion. Negative for dental problem, ear pain, nosebleeds, postnasal drip, rhinorrhea, sinus pressure, sneezing, sore throat and trouble swallowing.   Eyes: Negative for redness and itching.  Respiratory: Positive for cough and shortness of breath. Negative for chest tightness and wheezing.   Cardiovascular: Negative for palpitations and leg swelling.  Gastrointestinal: Negative for nausea and vomiting.  Genitourinary: Negative for dysuria.  Musculoskeletal: Negative for joint swelling.  Skin: Negative for rash.  Neurological: Negative for headaches.  Hematological: Does not bruise/bleed easily.  Psychiatric/Behavioral: Negative for dysphoric mood. The patient is not nervous/anxious.        Objective:   Physical Exam Discussion only visit Wife is present Vitals reviewed       Assessment & Plan:

## 2012-12-17 NOTE — Patient Instructions (Signed)
Please see Dr Si Gaul lung cancer specialist REturn to see me in 1 month with PFT breathing test

## 2012-12-18 ENCOUNTER — Encounter: Payer: Self-pay | Admitting: *Deleted

## 2012-12-18 NOTE — Progress Notes (Signed)
Called and spoke with pt regarding appt for mtoc.  Pt is unable to come today  But will come next Thursday. Appt time and place given. MTOC 12/25/12 at 12:45

## 2012-12-24 ENCOUNTER — Encounter: Payer: Self-pay | Admitting: Gastroenterology

## 2012-12-25 ENCOUNTER — Telehealth: Payer: Self-pay | Admitting: Internal Medicine

## 2012-12-25 ENCOUNTER — Ambulatory Visit
Admission: RE | Admit: 2012-12-25 | Discharge: 2012-12-25 | Disposition: A | Discharge status: Home / Self Care | Payer: Medicare Other | Source: Ambulatory Visit | Attending: Radiation Oncology | Admitting: Radiation Oncology

## 2012-12-25 ENCOUNTER — Encounter: Payer: Self-pay | Admitting: Radiation Oncology

## 2012-12-25 ENCOUNTER — Encounter: Payer: Self-pay | Admitting: *Deleted

## 2012-12-25 ENCOUNTER — Ambulatory Visit (HOSPITAL_BASED_OUTPATIENT_CLINIC_OR_DEPARTMENT_OTHER): Payer: Medicare Other | Admitting: Internal Medicine

## 2012-12-25 ENCOUNTER — Ambulatory Visit: Payer: Medicare Other | Attending: Internal Medicine | Admitting: Physical Therapy

## 2012-12-25 DIAGNOSIS — M25519 Pain in unspecified shoulder: Secondary | ICD-10-CM | POA: Insufficient documentation

## 2012-12-25 DIAGNOSIS — C3430 Malignant neoplasm of lower lobe, unspecified bronchus or lung: Secondary | ICD-10-CM

## 2012-12-25 DIAGNOSIS — C3490 Malignant neoplasm of unspecified part of unspecified bronchus or lung: Secondary | ICD-10-CM | POA: Insufficient documentation

## 2012-12-25 DIAGNOSIS — R062 Wheezing: Secondary | ICD-10-CM

## 2012-12-25 DIAGNOSIS — R42 Dizziness and giddiness: Secondary | ICD-10-CM

## 2012-12-25 DIAGNOSIS — R0602 Shortness of breath: Secondary | ICD-10-CM

## 2012-12-25 DIAGNOSIS — R293 Abnormal posture: Secondary | ICD-10-CM | POA: Insufficient documentation

## 2012-12-25 DIAGNOSIS — C3431 Malignant neoplasm of lower lobe, right bronchus or lung: Secondary | ICD-10-CM | POA: Insufficient documentation

## 2012-12-25 DIAGNOSIS — R110 Nausea: Secondary | ICD-10-CM

## 2012-12-25 DIAGNOSIS — IMO0001 Reserved for inherently not codable concepts without codable children: Secondary | ICD-10-CM | POA: Insufficient documentation

## 2012-12-25 DIAGNOSIS — R05 Cough: Secondary | ICD-10-CM

## 2012-12-25 NOTE — Progress Notes (Signed)
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 male who is seen out of the courtesy of Dr. Marchelle Gearing for an opinion concerning radiation therapy as part of management of patient's recently diagnosed squamous cell carcinoma of the right lung. Patient presented with several week history of cough and wheezing which did not improve with inhalation therapy. A chest x-ray was performed which revealed a right-sided lung mass. A subsequent chest CT scan revealed a right infrahilar mass with associated mediastinal lymphadenopathy.   the patient was taken to the operating room by Dr. Marchelle Gearing and underwent flexible bronchoscopy and bronchial biopsy. The patient was noted to have a polypoid friable endobronchial lesion which was in the right lower lobe 2.5 cm distal to the carina. This completely occluded the right lower lobe bronchus. Biopsy was performed of this area which revealed poorly differentiated squamous cell carcinoma. Patient did undergo a PET scan which showed this right infrahilar mass to be markedly hypermetabolic. There was also involvement noted in the right hilum and subcarinal area. No evidence of distant metastasis on PET scan. With this information the patient is now seen in the multidisciplinary thoracic clinic for evaluation.  PREVIOUS RADIATION THERAPY: No  PAST MEDICAL HISTORY:  has a past medical history of Dizziness and giddiness; Malignant neoplasm of bladder, part unspecified; Hyperlipidemia; Impotence of organic origin; Osteoarthritis, shoulder; Allergy; Diabetes mellitus without complication; Hypothyroidism; and COPD  (chronic obstructive pulmonary disease).    PAST SURGICAL HISTORY: Past Surgical History  Procedure Laterality Date  . Cystoscopy w/ dilation of bladder    . Tonsillectomy  age 30  . Endobronchial ultrasound Bilateral 12/15/2012    Procedure: ENDOBRONCHIAL ULTRASOUND;  Surgeon: Kalman Shan, MD;  Location: [facility];  Service: Cardiopulmonary;  Laterality: Bilateral;    FAMILY HISTORY: family history includes Kidney disease in his brother.  SOCIAL HISTORY:  reports that he quit smoking about 5 years ago. His smoking use included Cigarettes. He has a 75 pack-year smoking history. His smokeless tobacco use includes Chew. He reports that he drinks alcohol. He reports that he does not use illicit drugs. 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  MEDICATIONS:  Current Outpatient Prescriptions  Medication Sig Dispense Refill  . meloxicam (MOBIC) 15 MG tablet Take 15 mg by mouth daily.      . simvastatin (ZOCOR) 20 MG tablet Take one tablet by mouth once daily to lower cholesterol  90 tablet  3   No current facility-administered medications for this encounter.    REVIEW OF SYSTEMS:  A 15 point review of systems is documented in the electronic medical record. This was obtained by the nursing staff. However, I reviewed this with the patient to discuss relevant findings and make appropriate changes. He does complain of some generalized fatigue but continues to work. He does have some dyspnea with exertion and some coughing but no significant hemoptysis. patient denies any new bony pain headaches dizziness or blurred vision. He admits to some hearing problems.   PHYSICAL EXAM: Vital signs weight 207 pounds height 6 feet 5 inches  blood pressure 140/92 pulse 78 respirations 18 temperature 97.0 degrees   General Appearance:    Alert, cooperative, no distress, appears stated age,   accompanied by his wife on evaluation today   Head:    Normocephalic, without obvious abnormality, atraumatic  Eyes:    PERRL, conjunctiva/corneas clear, EOM's intact,           Nose:   Nares normal, septum midline, mucosa normal, no drainage    or sinus tenderness  Throat:   Lips, mucosa, and tongue normal; gums normal  Neck:   Supple, symmetrical, trachea midline, no adenopathy;       thyroid:  No enlargement/tenderness/nodules; no carotid   bruit or JVD  Back:     Symmetric, no curvature, ROM normal, no CVA tenderness  Lungs:     mildly decreased breath sounds in the right lower lung field, otherwise clear   Chest wall:    No tenderness or deformity  Heart:    Regular rate and rhythm, S1 and S2 normal, no murmur, rub   or gallop  Abdomen:     Soft, non-tender, bowel sounds active all four quadrants,    no masses, no organomegaly        Extremities:   Extremities normal, atraumatic, no cyanosis or edema  Pulses:   2+ and symmetric all extremities  Skin:   Skin color, texture, turgor normal, no rashes or lesions  Lymph nodes:   Cervical, supraclavicular, and axillary nodes normal  Neurologic:   Normal strength  throughout      ECOG = 1  1 - Symptomatic but completely ambulatory (Restricted in physically strenuous activity but ambulatory and able to carry out work of a light or sedentary nature. For example, light housework, office work)   LABORATORY DATA:  No results found for this basename: WBC, HGB, HCT, MCV, PLT   Lab Results  Component Value Date   NA 143 11/17/2012   K 4.8 11/17/2012   CL 103 11/17/2012   CO2 26 11/17/2012   Lab Results  Component Value Date   ALT 39 05/09/2012   AST 26 05/09/2012   ALKPHOS 82 05/09/2012   BILITOT 0.4 05/09/2012     RADIOGRAPHY: Nm Pet Image Initial (pi) Skull Base To Thigh  12/03/2012   CLINICAL DATA:  Initial treatment strategy for lung mass.  EXAM: NUCLEAR MEDICINE PET SKULL BASE TO THIGH  FASTING BLOOD GLUCOSE:  Value: 100mg /dl   TECHNIQUE: 81.1 mCi B-14 FDG was injected intravenously. CT data was obtained and used for attenuation correction and anatomic localization only. (This was not acquired as a diagnostic CT examination.) Additional exam technical data entered on technologist worksheet.  COMPARISON:  CT scan from 11/24/2012  FINDINGS: NECK  No hypermetabolic lymph nodes in the neck.  CHEST  The 4.7 x 6.2 cm lesion in the right infrahilar region is markedly hypermetabolic with SUV max = 20 for. B subcarinal and right hilar lymphadenopathy also shows substantial FDG accumulation. Hypermetabolic activity is seen in the band of airspace consolidation tracking peripherally along the major fissure to the lateral pleura. There is hypermetabolism at the level of the lateral pleura as well.  ABDOMEN/PELVIS  No adrenal mass. No hypermetabolism identified in either adrenal gland. Dense atherosclerotic calcification is noted in the wall of the abdominal aorta without aneurysm.  No evidence for hypermetabolic lymphadenopathy in the abdomen or pelvis. No focal abnormal hypermetabolism within the liver.  SKELETON  No focal hypermetabolic activity to suggest skeletal metastasis.  IMPRESSION: Markedly hypermetabolic  right infrahilar mass consistent with neoplasm. There is hypermetabolic metastatic involvement in the right hilum and subcarinal station.  No evidence for distant metastases in the neck, abdomen, or pelvis.   Electronically Signed   By: Kennith Center M.D.   On: 12/03/2012 12:16      IMPRESSION: Stage III-A (T2b, N2, Mx)  Squamous Cell Carcinoma of the Right Lung.  The patient would be a good candidate for a definitive course of radiation along with radiosensitizing chemotherapy.  Final treatment details however will depend on the patient's scheduled MRI of the brain to rule out distant metastasis. I discussed the overall treatment course side effects and potential toxicities of radiation therapy in this situation with the patient and his  wife. Patient appears to understand and wishes to proceed with planned course of treatment.  PLAN: Simulation and planning on December 22 with treatments to begin December 29 or 30th. Anticipate approximately 7 weeks of radiation therapy as part of his overall management.  I spent 60 minutes minutes face to face with the patient and more than 50% of that time was spent in counseling and/or coordination of care.   ------------------------------------------------  -----------------------------------  Billie Lade, PhD, MD

## 2012-12-25 NOTE — Progress Notes (Signed)
[facility]  Clinical Social Work   Clinical Social Work met with patient/spouse at [employer] to offer support and assess for needs. MD reviewed diagnosis and treatment plan with patient.  The patient was agreeable to the plan and asked questions regarding scheduling and chemotherapy side effects. CSW briefly discussed CSW role at cancer center, patient reports no concerns at this time. CSW encouraged patient/family to call with any questions or concerns and CSW will follow up as needed.   Kathrin Penner, MSW, LCSW  Clinical Social Worker  [facility]  [phone]

## 2012-12-25 NOTE — Progress Notes (Signed)
[facility] Telephone:([phone]) [phone]   Fax:(336) [phone]  CONSULT NOTE  REFERRING PHYSICIAN:Dr. Ramaswamy  REASON FOR CONSULTATION:  72 years old Pietila male recently diagnosed with lung cancer  HPI LINAS STEPTER is a 72 y.o. male with a past medical history significant for COPD, osteoarthritis, dyslipidemia, vertigo and hypothyroidism as well as history of early-stage bladder cancer in 1990 status post surgical resection. The patient mentions that for few months since August of 2014 he has been complaining of increasing weakness and shortness of breath as well as cough. He was treated several times with inhaler with no significant improvement. Chest x-ray was performed on 11/19/2012 and it showed segmental collapse of the posterior basal right lower lobe. Underlying mass cannot be excluded and there was possible right hilar adenopathy. This was followed by CT scan of the chest on 12/02/2012 and it showed bulky right hilar and subcarinal adenopathy. Occlusion of  the right bronchus intermedius by a central right hilar/ infrahilar mass. This is very difficult to measure due to the chole less since with adenopathy and postobstructive collapse in the right lower lobe, but the area measures approximately 6.2 x 4.7 cm. Associated collapse/ atelectasis in the right lower lobe as seen on chest x-ray. Large supple carina adenopathy measures 4.7 x 2.9 cm. Right hilar adenopathy on image 35 measures 2.0 x 2.0 cm. High right peritracheal lymph node has a short axis diameter of 8 mm. No left hilar adenopathy. No axillary adenopathy. A PET scan was performed on 12/03/2012 and it showed markedly hypermetabolic right infrahilar mass consistent with neoplasm. There is hypermetabolic metastatic involvement in the right hilum and subcarinal station. No evidence for distant metastases in the neck, abdomen, or pelvis. On 12/15/2012 the patient underwent flexible bronchoscopy with endobronchial biopsies  of the right lower lobe endobronchial lesion, endobronchial ultrasound and transbronchial needle aspiration of the 10R and 7 lymph nodes. The final pathology (Accession: [id number]) showed poorly differentiated squamous cell carcinoma. Dr. Marchelle Gearing kindly referred the patient to me today for further evaluation and recommendation regarding treatment of his condition. When seen today, he continues to complain of mild shortness of breath as well as dry cough and wheezes in addition to hoarseness of his voice and fatigue. He continues to have mild nausea secondary to positional vertigo. He denied having any significant weight loss or night sweats. The patient denied having any headache or visual changes. Family history significant for a mother with dementia, hypertension and diabetes mellitus. Father had a stroke and cancer of the eye. The patient is married and has 3 children. He was accompanied by his wife Clerance Lav. He has a history of smoking up to 2 pack per day for around 15 years and he quit 5 years ago. He drinks alcohol occasionally and no history of drug abuse.   HPI  Past Medical History  Diagnosis Date  . Dizziness and giddiness     positional vertigo  . Malignant neoplasm of bladder, part unspecified   . Hyperlipidemia   . Impotence of organic origin   . Osteoarthritis, shoulder   . Allergy   . Diabetes mellitus without complication     pt denies diabetes, dr pandey note says dm 11-19-12 epic  . Hypothyroidism     pt denies thryoid disease, dr pandey note 11-19-12 says subclinical hypothryoid in epic  . COPD (chronic obstructive pulmonary disease)     Past Surgical History  Procedure Laterality Date  . Cystoscopy w/ dilation of bladder    .  Tonsillectomy  age 89  . Endobronchial ultrasound Bilateral 12/15/2012    Procedure: ENDOBRONCHIAL ULTRASOUND;  Surgeon: Kalman Shan, MD;  Location: [facility];  Service: Cardiopulmonary;  Laterality: Bilateral;    Family History    Problem Relation Age of Onset  . Kidney disease Brother     Social History History  Substance Use Topics  . Smoking status: Former Smoker -- 1.50 packs/day for 50 years    Types: Cigarettes    Quit date: 01/09/2007  . Smokeless tobacco: Current User    Types: Chew  . Alcohol Use: Yes     Comment: 4-5 beers a month, 1 mixed drink every 2 months    No Known Allergies  Current Outpatient Prescriptions  Medication Sig Dispense Refill  . meloxicam (MOBIC) 15 MG tablet Take 15 mg by mouth daily.      . simvastatin (ZOCOR) 20 MG tablet Take one tablet by mouth once daily to lower cholesterol  90 tablet  3   No current facility-administered medications for this visit.    Review of Systems  Constitutional: positive for fatigue Eyes: negative Ears, nose, mouth, throat, and face: negative Respiratory: positive for cough, dyspnea on exertion and wheezing Cardiovascular: negative Gastrointestinal: negative Genitourinary:negative Integument/breast: negative Hematologic/lymphatic: negative Musculoskeletal:negative Neurological: negative Behavioral/Psych: negative Endocrine: negative Allergic/Immunologic: negative  Physical Exam  ZOX:WRUEA, healthy, no distress, well nourished and well developed SKIN: skin color, texture, turgor are normal, no rashes or significant lesions HEAD: Normocephalic, No masses, lesions, tenderness or abnormalities EYES: normal, PERRLA EARS: External ears normal, Canals clear OROPHARYNX:no exudate, no erythema and lips, buccal mucosa, and tongue normal  NECK: supple, no adenopathy, no JVD LYMPH:  no palpable lymphadenopathy, no hepatosplenomegaly LUNGS: expiratory wheezes bilaterally HEART: regular rate & rhythm, no murmurs and no gallops ABDOMEN:abdomen soft, non-tender, normal bowel sounds and no masses or organomegaly BACK: Back symmetric, no curvature., No CVA tenderness EXTREMITIES:no joint deformities, effusion, or inflammation, no edema, no  skin discoloration, no clubbing  NEURO: alert & oriented x 3 with fluent speech, no focal motor/sensory deficits  PERFORMANCE STATUS: ECOG 1  LABORATORY DATA: No results found for this basename: WBC, HGB, HCT, MCV, PLT      Chemistry      Component Value Date/Time   NA 143 11/17/2012 0805   K 4.8 11/17/2012 0805   CL 103 11/17/2012 0805   CO2 26 11/17/2012 0805   BUN 13 11/17/2012 0805   CREATININE 0.95 11/17/2012 0805      Component Value Date/Time   CALCIUM 10.0 11/17/2012 0805   ALKPHOS 82 05/09/2012 0854   AST 26 05/09/2012 0854   ALT 39 05/09/2012 0854   BILITOT 0.4 05/09/2012 0854       RADIOGRAPHIC STUDIES: Nm Pet Image Initial (pi) Skull Base To Thigh  12/03/2012   CLINICAL DATA:  Initial treatment strategy for lung mass.  EXAM: NUCLEAR MEDICINE PET SKULL BASE TO THIGH  FASTING BLOOD GLUCOSE:  Value: 100mg /dl  TECHNIQUE: 54.0 mCi J-81 FDG was injected intravenously. CT data was obtained and used for attenuation correction and anatomic localization only. (This was not acquired as a diagnostic CT examination.) Additional exam technical data entered on technologist worksheet.  COMPARISON:  CT scan from 11/24/2012  FINDINGS: NECK  No hypermetabolic lymph nodes in the neck.  CHEST  The 4.7 x 6.2 cm lesion in the right infrahilar region is markedly hypermetabolic with SUV max = 20 for. B subcarinal and right hilar lymphadenopathy also shows substantial FDG accumulation. Hypermetabolic activity is seen  in the band of airspace consolidation tracking peripherally along the major fissure to the lateral pleura. There is hypermetabolism at the level of the lateral pleura as well.  ABDOMEN/PELVIS  No adrenal mass. No hypermetabolism identified in either adrenal gland. Dense atherosclerotic calcification is noted in the wall of the abdominal aorta without aneurysm.  No evidence for hypermetabolic lymphadenopathy in the abdomen or pelvis. No focal abnormal hypermetabolism within the liver.  SKELETON   No focal hypermetabolic activity to suggest skeletal metastasis.  IMPRESSION: Markedly hypermetabolic right infrahilar mass consistent with neoplasm. There is hypermetabolic metastatic involvement in the right hilum and subcarinal station.  No evidence for distant metastases in the neck, abdomen, or pelvis.   Electronically Signed   By: Kennith Center M.D.   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   PLAN: I had a lengthy discussion with the patient and his wife today about his current disease stage, prognosis and treatment options. I will complete the staging workup by ordering MRI of the brain to rule out brain metastasis. I discussed with the patient treatment options including a course of concurrent chemoradiation with weekly carboplatin for AUC of 2 and paclitaxel 45 mg/M2 for a total of 7 weeks. I discussed with the patient adverse effect of the chemotherapy including but not limited to alopecia, myelosuppression, nausea and vomiting, peripheral neuropathy, liver or renal dysfunction. The patient will be seen later today by Dr. Roselind Messier for evaluation and discussion of the radiotherapy option. I will arrange for the patient to have a chemotherapy education class before starting the first cycle of his treatment. I will call his pharmacy with prescription for Compazine 10 mg by mouth every 6 hours as needed for nausea. I expect the patient to start the first cycle of this treatment on the week of 01/05/2013. He would come back for follow up visit in 3 weeks for evaluation and management any adverse effect of his treatment. I gave the patient and his wife that time to ask questions and I answered them completely to their  satisfactions. He was advised to call immediately if he has any concerning symptoms in the interval.  The patient voices understanding of current disease status and treatment options and is in agreement with the current care plan.  All questions were answered. The patient knows to call the clinic with any problems, questions or concerns. We can certainly see the patient much sooner if necessary.  Thank you so much for allowing me to participate in the care of Chananya L Stegenga. I will continue to follow up the patient with you and assist in his care.  I spent 55 minutes counseling the patient face to face. The total time spent in the appointment was 70 minutes.  Shray Hunley K. 12/25/2012, 3:18 PM

## 2012-12-25 NOTE — Telephone Encounter (Signed)
appts made per 12/18 POF Email to MW to add txs AVS and CAL given shh

## 2012-12-25 NOTE — Progress Notes (Signed)
   Thoracic Treatment Summary Name:Steve [name] Date:12/25/2012 DOB:1940-06-26 Your Medical Team Medical Oncologist: Dr. Arbutus Ped Radiation Oncologist: Dr. Roselind Messier Pulmonologist: Dr. Marchelle Gearing  Type and Stage of Lung Cancer Non-Small Cell Carcinoma:  Squamous Cell  Clinical Stage:  III A   Clinical stage is based on radiology exams.  Pathological stage will be determined after surgery.  Staging is based on the size of the tumor, involvement of lymph nodes or not, and whether or not the cancer center has spread. Recommendations Recommendations: Concurrent chemoradiation therapy  These recommendations are based on information available as of today's consult.  This is subject to change depending further testing or exams. Next Steps Next Step: 1. Medical oncology will schedule MRI Brain, chemo education class, and chemotherapy treatment 2. Radiation Oncology will schedule Simulation appointment and followed other radiation appointments. Simulation appointment 12/29/12 at 9:00  Barriers to Care What do you perceive as a potential barrier that may prevent you from receiving your treatment plan? Nothing identified at this time Resources given: NCI booklet on lung cancer [facility] and community resource given American [employer] [phone] Questions Willette Pa, RN BSN Thoracic Oncology Nurse Navigator at [phone]  Steve [name] is a nurse navigator that is available to assist you through your cancer journey.  She can answer your questions and/or provide resources regarding your treatment plan, emotional support, or financial concerns.

## 2012-12-26 ENCOUNTER — Telehealth: Payer: Self-pay | Admitting: *Deleted

## 2012-12-26 NOTE — Telephone Encounter (Signed)
Per staff message and POF I have scheduled appts. I have advised the scheduler that the lab appts need to be moved. Plus no available for treatment on 12/29, moved to 12/30, scheduler advised  JMW

## 2012-12-27 ENCOUNTER — Encounter: Payer: Self-pay | Admitting: Internal Medicine

## 2012-12-27 NOTE — Patient Instructions (Signed)
You are recently diagnosed with a stage IIIA non-small cell lung cancer. We discussed treatment options including a course of concurrent chemoradiation. First dose expected 01/05/2013.

## 2012-12-27 NOTE — Assessment & Plan Note (Signed)
Biopsy was also poorly differentiated squamous cell carcinoma. He stage TII B. on 6 cm mass that is 2.5 cm distal to the carina with right lower lobe collapse, N2 disease some subcarinal station and no evidence of distant metastasis. Stage III A.    Staging discussed Prognosis in brief terms diagnosed ECOG 0 and good prognostic sign discussed Non operability discussed ChemoXRT as Rx discussed  Will refer to rad onc and chemo onc for eval; he wlll start Rx after xmas 2014  > 50% of this > 25 min visit spent in face to face counseling (15 min visit converted to 25 min)

## 2012-12-29 ENCOUNTER — Encounter: Payer: Self-pay | Admitting: *Deleted

## 2012-12-29 ENCOUNTER — Telehealth: Payer: Self-pay | Admitting: *Deleted

## 2012-12-29 ENCOUNTER — Other Ambulatory Visit: Payer: Medicare Other

## 2012-12-29 ENCOUNTER — Ambulatory Visit
Admission: RE | Admit: 2012-12-29 | Discharge: 2012-12-29 | Disposition: A | Discharge status: Home / Self Care | Payer: Medicare Other | Source: Ambulatory Visit | Attending: Radiation Oncology | Admitting: Radiation Oncology

## 2012-12-29 ENCOUNTER — Other Ambulatory Visit: Payer: Self-pay | Admitting: *Deleted

## 2012-12-29 DIAGNOSIS — C3490 Malignant neoplasm of unspecified part of unspecified bronchus or lung: Secondary | ICD-10-CM | POA: Insufficient documentation

## 2012-12-29 DIAGNOSIS — Z79899 Other long term (current) drug therapy: Secondary | ICD-10-CM | POA: Insufficient documentation

## 2012-12-29 DIAGNOSIS — K219 Gastro-esophageal reflux disease without esophagitis: Secondary | ICD-10-CM | POA: Insufficient documentation

## 2012-12-29 DIAGNOSIS — Y842 Radiological procedure and radiotherapy as the cause of abnormal reaction of the patient, or of later complication, without mention of misadventure at the time of the procedure: Secondary | ICD-10-CM | POA: Insufficient documentation

## 2012-12-29 DIAGNOSIS — I4891 Unspecified atrial fibrillation: Secondary | ICD-10-CM | POA: Insufficient documentation

## 2012-12-29 DIAGNOSIS — L589 Radiodermatitis, unspecified: Secondary | ICD-10-CM | POA: Insufficient documentation

## 2012-12-29 NOTE — Progress Notes (Signed)
Spoke with pt at [facility] to clarify schedule.

## 2012-12-29 NOTE — Progress Notes (Signed)
Called central scheduling regarding mri brain.  Pt request they call him on his cell phone.  They verbalized understanding

## 2012-12-30 ENCOUNTER — Telehealth: Payer: Self-pay | Admitting: Internal Medicine

## 2012-12-30 NOTE — Progress Notes (Signed)
  Radiation Oncology         [phone] ________________________________  Name: Steve [name] MRN: [id number]  Date: 12/29/2012  DOB: 28-Jul-1940  SIMULATION AND TREATMENT PLANNING NOTE  DIAGNOSIS:  Stage III-A (T2b, N2, Mx) Squamous Cell Carcinoma of the Right Lung   NARRATIVE:  The patient was brought to the CT Simulation planning suite.  Identity was confirmed.  All relevant records and images related to the planned course of therapy were reviewed.  The patient freely provided informed written consent to proceed with treatment after reviewing the details related to the planned course of therapy. The consent form was witnessed and verified by the simulation staff.  Then, the patient was set-up in a stable reproducible  supine position for radiation therapy.  CT images were obtained.  Surface markings were placed.  The CT images were loaded into the planning software.  Then the target and avoidance structures were contoured.  Treatment planning then occurred.  The radiation prescription was entered and confirmed.  Then, I designed and supervised the construction of a total of 1 medically necessary complex treatment devices.  I have requested : 3D Simulation  I have requested a DVH of the following structures: GTV, PTV, lungs, esophagus, spinal cord.  I have ordered:dose calc.  PLAN:  The patient will receive 63 Gy in 35 fractions.  ________________________________   Special treatment procedure note  The patient we receiving radiosensitizing chemotherapy throughout his treatments. Given the increased potential for toxicities as well as the necessity for close monitoring of the patient and bloodwork, this constitutes a special treatment procedure. -----------------------------------  Billie Lade, PhD, MD

## 2012-12-30 NOTE — Telephone Encounter (Signed)
Per MW no avail for chemo on 12/29 must be 12/30 Labs moved and pt notified.  Pt. confused about 12/29 radiation appt sw Clydie Braun in [employer] [facility] someone will call pt and explain shh

## 2013-01-02 ENCOUNTER — Other Ambulatory Visit: Payer: Medicare Other

## 2013-01-05 ENCOUNTER — Ambulatory Visit
Admission: RE | Admit: 2013-01-05 | Discharge: 2013-01-05 | Disposition: A | Discharge status: Home / Self Care | Payer: Medicare Other | Source: Ambulatory Visit | Attending: Radiation Oncology | Admitting: Radiation Oncology

## 2013-01-05 ENCOUNTER — Other Ambulatory Visit: Payer: Medicare Other

## 2013-01-05 MED ORDER — RADIAPLEXRX EX GEL
Freq: Once | CUTANEOUS | Status: AC
  Administered 2013-01-05: 16:00:00 via TOPICAL

## 2013-01-05 NOTE — Progress Notes (Signed)
Steve [name] here for post sim education.  He was given the Radiation Therapy and You book and discussed possible side effects and management including fatigue, skin changes, throat changes and coughing.  He was given radiaplex gel and was instructed to apply it twice a day, in the morning at least 4 hours before treatment and at bedtime.  He was educated about under treat day with Dr. Roselind Messier on Tuesday's.  He was advised to call with any questions or concerns.

## 2013-01-06 ENCOUNTER — Encounter: Payer: Self-pay | Admitting: Radiation Oncology

## 2013-01-06 ENCOUNTER — Other Ambulatory Visit: Payer: Self-pay | Admitting: Internal Medicine

## 2013-01-06 ENCOUNTER — Ambulatory Visit
Admission: RE | Admit: 2013-01-06 | Discharge: 2013-01-06 | Disposition: A | Discharge status: Home / Self Care | Payer: Medicare Other | Source: Ambulatory Visit | Attending: Radiation Oncology | Admitting: Radiation Oncology

## 2013-01-06 ENCOUNTER — Ambulatory Visit (HOSPITAL_BASED_OUTPATIENT_CLINIC_OR_DEPARTMENT_OTHER): Payer: Medicare Other

## 2013-01-06 ENCOUNTER — Other Ambulatory Visit (HOSPITAL_BASED_OUTPATIENT_CLINIC_OR_DEPARTMENT_OTHER): Payer: Medicare Other

## 2013-01-06 DIAGNOSIS — C3430 Malignant neoplasm of lower lobe, unspecified bronchus or lung: Secondary | ICD-10-CM

## 2013-01-06 DIAGNOSIS — Z5111 Encounter for antineoplastic chemotherapy: Secondary | ICD-10-CM

## 2013-01-06 LAB — CBC WITH DIFFERENTIAL/PLATELET
BASO%: 0.5 % (ref 0.0–2.0)
Basophils Absolute: 0 10*3/uL (ref 0.0–0.1)
Eosinophils Absolute: 0.3 10*3/uL (ref 0.0–0.5)
HCT: 43.4 % (ref 38.4–49.9)
HGB: 14.4 g/dL (ref 13.0–17.1)
LYMPH%: 23.6 % (ref 14.0–49.0)
MCHC: 33.2 g/dL (ref 32.0–36.0)
MCV: 89.3 fL (ref 79.3–98.0)
MONO%: 12.2 % (ref 0.0–14.0)
NEUT#: 4.9 10*3/uL (ref 1.5–6.5)
Platelets: 344 10*3/uL (ref 140–400)
RBC: 4.86 10*6/uL (ref 4.20–5.82)
RDW: 13.5 % (ref 11.0–14.6)
WBC: 8.1 10*3/uL (ref 4.0–10.3)
lymph#: 1.9 10*3/uL (ref 0.9–3.3)

## 2013-01-06 LAB — COMPREHENSIVE METABOLIC PANEL (CC13)
ALT: 20 U/L (ref 0–55)
AST: 14 U/L (ref 5–34)
Albumin: 3.6 g/dL (ref 3.5–5.0)
Alkaline Phosphatase: 93 U/L (ref 40–150)
Potassium: 4 mEq/L (ref 3.5–5.1)
Sodium: 142 mEq/L (ref 136–145)
Total Bilirubin: 0.36 mg/dL (ref 0.20–1.20)
Total Protein: 7.9 g/dL (ref 6.4–8.3)

## 2013-01-06 MED ORDER — SODIUM CHLORIDE 0.9 % IV SOLN
Freq: Once | INTRAVENOUS | Status: AC
  Administered 2013-01-06: 15:00:00 via INTRAVENOUS

## 2013-01-06 MED ORDER — DEXAMETHASONE SODIUM PHOSPHATE 20 MG/5ML IJ SOLN
INTRAMUSCULAR | Status: AC
  Filled 2013-01-06: qty 5

## 2013-01-06 MED ORDER — DIPHENHYDRAMINE HCL 50 MG/ML IJ SOLN
50.0000 mg | Freq: Once | INTRAMUSCULAR | Status: AC
  Administered 2013-01-06: 50 mg via INTRAVENOUS

## 2013-01-06 MED ORDER — SODIUM CHLORIDE 0.9 % IV SOLN
230.0000 mg | Freq: Once | INTRAVENOUS | Status: AC
  Administered 2013-01-06: 230 mg via INTRAVENOUS
  Filled 2013-01-06: qty 23

## 2013-01-06 MED ORDER — FAMOTIDINE IN NACL 20-0.9 MG/50ML-% IV SOLN
INTRAVENOUS | Status: AC
  Filled 2013-01-06: qty 50

## 2013-01-06 MED ORDER — PROCHLORPERAZINE MALEATE 10 MG PO TABS: 10.0000 mg | ORAL_TABLET | Freq: Four times a day (QID) | ORAL | Status: DC | PRN

## 2013-01-06 MED ORDER — ONDANSETRON 16 MG/50ML IVPB (CHCC)
16.0000 mg | Freq: Once | INTRAVENOUS | Status: AC
  Administered 2013-01-06: 16 mg via INTRAVENOUS

## 2013-01-06 MED ORDER — FAMOTIDINE IN NACL 20-0.9 MG/50ML-% IV SOLN
20.0000 mg | Freq: Once | INTRAVENOUS | Status: AC
  Administered 2013-01-06: 20 mg via INTRAVENOUS

## 2013-01-06 MED ORDER — DIPHENHYDRAMINE HCL 50 MG/ML IJ SOLN
INTRAMUSCULAR | Status: AC
  Filled 2013-01-06: qty 1

## 2013-01-06 MED ORDER — DEXAMETHASONE SODIUM PHOSPHATE 20 MG/5ML IJ SOLN
20.0000 mg | Freq: Once | INTRAMUSCULAR | Status: AC
  Administered 2013-01-06: 20 mg via INTRAVENOUS

## 2013-01-06 MED ORDER — SODIUM CHLORIDE 0.9 % IV SOLN
43.0000 mg/m2 | Freq: Once | INTRAVENOUS | Status: AC
  Administered 2013-01-06: 96 mg via INTRAVENOUS
  Filled 2013-01-06: qty 16

## 2013-01-06 MED ORDER — PACLITAXEL CHEMO INJECTION 300 MG/50ML: 45.0000 mg/m2 | Freq: Once | INTRAVENOUS | Status: DC

## 2013-01-06 NOTE — Patient Instructions (Signed)
[facility] Discharge Instructions for Patients Receiving Chemotherapy  Today you received the following chemotherapy agents :  Taxol, Carboplatin.  To help prevent nausea and vomiting after your treatment, we encourage you to take your nausea medication as instructed by your physician.  Take Compazine 10 mg by mouth every 6 hours as needed for nausea.  DO NOT Drive after taking Compazine as it can cause drowsiness.  DO Drink lots of fluids as tolerated.  DO Not Eat greasy nor spicy foods.    If you develop nausea and vomiting that is not controlled by your nausea medication, call the clinic.   BELOW ARE SYMPTOMS THAT SHOULD BE REPORTED IMMEDIATELY:  *FEVER GREATER THAN 100.5 F  *CHILLS WITH OR WITHOUT FEVER  NAUSEA AND VOMITING THAT IS NOT CONTROLLED WITH YOUR NAUSEA MEDICATION  *UNUSUAL SHORTNESS OF BREATH  *UNUSUAL BRUISING OR BLEEDING  TENDERNESS IN MOUTH AND THROAT WITH OR WITHOUT PRESENCE OF ULCERS  *URINARY PROBLEMS  *BOWEL PROBLEMS  UNUSUAL RASH Items with * indicate a potential emergency and should be followed up as soon as possible.  Feel free to call the clinic you have any questions or concerns. The clinic phone number is [phone].

## 2013-01-06 NOTE — Progress Notes (Signed)
Pt denies pain, fatigue, loss of appetite. He has baseline dry hoarse cough, denies SOB. Pt for labs, infusion today.

## 2013-01-06 NOTE — Progress Notes (Signed)
LFT results reviewed with Dr. Donnald Garre as it was drawn in May 2014. MD aware, as pt. is on low dose weekly treatment, order received to begin taxol now.  Will wait until today's Bmet result prior to [place] approval per MD.  Pharmacy, charge nurse  and primary RN notified. HL

## 2013-01-06 NOTE — Progress Notes (Signed)
Weekly Management Note Current Dose: 3.6  Gy  Projected Dose: 50.4 Gy   Narrative:  The patient presents for routine under treatment assessment.  CBCT/MVCT images/Port film x-rays were reviewed.  The chart was checked. No complaints. Chemo today.  Physical Findings: Weight: 207 lb 11.2 oz (94.212 kg). Unchanged  Impression:  The patient is tolerating radiation.  Plan:  Continue treatment as planned. RN education performed.

## 2013-01-07 ENCOUNTER — Telehealth: Payer: Self-pay | Admitting: *Deleted

## 2013-01-07 ENCOUNTER — Ambulatory Visit
Admission: RE | Admit: 2013-01-07 | Discharge: 2013-01-07 | Disposition: A | Discharge status: Home / Self Care | Payer: Medicare Other | Source: Ambulatory Visit | Attending: Radiation Oncology | Admitting: Radiation Oncology

## 2013-01-07 NOTE — Telephone Encounter (Signed)
No adverse event from chemo treatment. No questions or concerns.

## 2013-01-09 ENCOUNTER — Ambulatory Visit
Admission: RE | Admit: 2013-01-09 | Discharge: 2013-01-09 | Disposition: A | Discharge status: Home / Self Care | Payer: Medicare Other | Source: Ambulatory Visit | Attending: Radiation Oncology | Admitting: Radiation Oncology

## 2013-01-12 ENCOUNTER — Ambulatory Visit (HOSPITAL_BASED_OUTPATIENT_CLINIC_OR_DEPARTMENT_OTHER): Payer: Medicare HMO

## 2013-01-12 ENCOUNTER — Ambulatory Visit
Admission: RE | Admit: 2013-01-12 | Discharge: 2013-01-12 | Disposition: A | Discharge status: Home / Self Care | Payer: Medicare Other | Source: Ambulatory Visit | Attending: Radiation Oncology | Admitting: Radiation Oncology

## 2013-01-12 ENCOUNTER — Other Ambulatory Visit (HOSPITAL_BASED_OUTPATIENT_CLINIC_OR_DEPARTMENT_OTHER): Payer: Medicare HMO

## 2013-01-12 DIAGNOSIS — C3410 Malignant neoplasm of upper lobe, unspecified bronchus or lung: Secondary | ICD-10-CM

## 2013-01-12 DIAGNOSIS — Z5111 Encounter for antineoplastic chemotherapy: Secondary | ICD-10-CM

## 2013-01-12 LAB — COMPREHENSIVE METABOLIC PANEL (CC13)
ALBUMIN: 3.2 g/dL — AB (ref 3.5–5.0)
ALK PHOS: 78 U/L (ref 40–150)
ALT: 19 U/L (ref 0–55)
AST: 14 U/L (ref 5–34)
Anion Gap: 12 mEq/L — ABNORMAL HIGH (ref 3–11)
BUN: 22.5 mg/dL (ref 7.0–26.0)
CO2: 25 meq/L (ref 22–29)
Calcium: 9.4 mg/dL (ref 8.4–10.4)
Chloride: 104 mEq/L (ref 98–109)
Creatinine: 0.9 mg/dL (ref 0.7–1.3)
GLUCOSE: 99 mg/dL (ref 70–140)
POTASSIUM: 4 meq/L (ref 3.5–5.1)
Sodium: 141 mEq/L (ref 136–145)
TOTAL PROTEIN: 7.5 g/dL (ref 6.4–8.3)
Total Bilirubin: 0.34 mg/dL (ref 0.20–1.20)

## 2013-01-12 LAB — CBC WITH DIFFERENTIAL/PLATELET
BASO%: 0.3 % (ref 0.0–2.0)
Basophils Absolute: 0 10*3/uL (ref 0.0–0.1)
EOS ABS: 0.2 10*3/uL (ref 0.0–0.5)
EOS%: 3.3 % (ref 0.0–7.0)
HCT: 43.2 % (ref 38.4–49.9)
HGB: 14.4 g/dL (ref 13.0–17.1)
LYMPH#: 1.4 10*3/uL (ref 0.9–3.3)
LYMPH%: 20.5 % (ref 14.0–49.0)
MCH: 29.6 pg (ref 27.2–33.4)
MCHC: 33.3 g/dL (ref 32.0–36.0)
MCV: 88.7 fL (ref 79.3–98.0)
MONO#: 0.8 10*3/uL (ref 0.1–0.9)
MONO%: 11.7 % (ref 0.0–14.0)
NEUT%: 64.2 % (ref 39.0–75.0)
NEUTROS ABS: 4.3 10*3/uL (ref 1.5–6.5)
Platelets: 352 10*3/uL (ref 140–400)
RBC: 4.87 10*6/uL (ref 4.20–5.82)
RDW: 13.3 % (ref 11.0–14.6)
WBC: 6.7 10*3/uL (ref 4.0–10.3)
nRBC: 0 % (ref 0–0)

## 2013-01-12 MED ORDER — SODIUM CHLORIDE 0.9 % IV SOLN
230.0000 mg | Freq: Once | INTRAVENOUS | Status: AC
  Administered 2013-01-12: 230 mg via INTRAVENOUS
  Filled 2013-01-12: qty 23

## 2013-01-12 MED ORDER — DEXAMETHASONE SODIUM PHOSPHATE 20 MG/5ML IJ SOLN
20.0000 mg | Freq: Once | INTRAMUSCULAR | Status: AC
  Administered 2013-01-12: 20 mg via INTRAVENOUS

## 2013-01-12 MED ORDER — FAMOTIDINE IN NACL 20-0.9 MG/50ML-% IV SOLN
20.0000 mg | Freq: Once | INTRAVENOUS | Status: AC
  Administered 2013-01-12: 20 mg via INTRAVENOUS

## 2013-01-12 MED ORDER — ONDANSETRON 16 MG/50ML IVPB (CHCC)
INTRAVENOUS | Status: AC
  Filled 2013-01-12: qty 16

## 2013-01-12 MED ORDER — SODIUM CHLORIDE 0.9 % IV SOLN
Freq: Once | INTRAVENOUS | Status: AC
  Administered 2013-01-12: 15:00:00 via INTRAVENOUS

## 2013-01-12 MED ORDER — ONDANSETRON 16 MG/50ML IVPB (CHCC)
16.0000 mg | Freq: Once | INTRAVENOUS | Status: AC
  Administered 2013-01-12: 16 mg via INTRAVENOUS

## 2013-01-12 MED ORDER — DIPHENHYDRAMINE HCL 50 MG/ML IJ SOLN
INTRAMUSCULAR | Status: AC
  Filled 2013-01-12: qty 1

## 2013-01-12 MED ORDER — DIPHENHYDRAMINE HCL 50 MG/ML IJ SOLN
50.0000 mg | Freq: Once | INTRAMUSCULAR | Status: AC
  Administered 2013-01-12: 50 mg via INTRAVENOUS

## 2013-01-12 MED ORDER — FAMOTIDINE IN NACL 20-0.9 MG/50ML-% IV SOLN
INTRAVENOUS | Status: AC
  Filled 2013-01-12: qty 50

## 2013-01-12 MED ORDER — DEXAMETHASONE SODIUM PHOSPHATE 20 MG/5ML IJ SOLN
INTRAMUSCULAR | Status: AC
  Filled 2013-01-12: qty 5

## 2013-01-12 MED ORDER — PACLITAXEL CHEMO INJECTION 300 MG/50ML
45.0000 mg/m2 | Freq: Once | INTRAVENOUS | Status: AC
  Administered 2013-01-12: 96 mg via INTRAVENOUS
  Filled 2013-01-12: qty 16

## 2013-01-12 NOTE — Patient Instructions (Signed)
[place] Discharge Instructions for Patients Receiving Chemotherapy  Today you received the following chemotherapy agents Taxol/Carboplatin To help prevent nausea and vomiting after your treatment, we encourage you to take your nausea medication as prescribed.     If you develop nausea and vomiting that is not controlled by your nausea medication, call the clinic.   BELOW ARE SYMPTOMS THAT SHOULD BE REPORTED IMMEDIATELY:  *FEVER GREATER THAN 100.5 F  *CHILLS WITH OR WITHOUT FEVER  NAUSEA AND VOMITING THAT IS NOT CONTROLLED WITH YOUR NAUSEA MEDICATION  *UNUSUAL SHORTNESS OF BREATH  *UNUSUAL BRUISING OR BLEEDING  TENDERNESS IN MOUTH AND THROAT WITH OR WITHOUT PRESENCE OF ULCERS  *URINARY PROBLEMS  *BOWEL PROBLEMS  UNUSUAL RASH Items with * indicate a potential emergency and should be followed up as soon as possible.  Feel free to call the clinic should you have any questions or concerns. The clinic phone number is [phone].

## 2013-01-13 ENCOUNTER — Encounter: Payer: Self-pay | Admitting: Radiation Oncology

## 2013-01-13 ENCOUNTER — Ambulatory Visit
Admission: RE | Admit: 2013-01-13 | Discharge: 2013-01-13 | Disposition: A | Discharge status: Home / Self Care | Payer: Medicare Other | Source: Ambulatory Visit | Attending: Radiation Oncology | Admitting: Radiation Oncology

## 2013-01-13 NOTE — Progress Notes (Signed)
Reports that he feels great today in comparison to how bad he felt over the weekend following his initial chemotherapy. Reports a dry cough but, denies this has increased in frequency. Hoarseness noted. Denies shortness of breath or dysphagia. Reports that he is eating softer foods and drinking more water. Concerned that his hands and feet remain cold causing him to have to wear gloves while at home.

## 2013-01-13 NOTE — Progress Notes (Signed)
[place]     Rexene Edison, M.D. [place], [place] [address]               Blair Promise, M.D., Ph.D. Phone: [phone]      [place] A. Tammi Klippel, M.D. Fax: [phone]      Jodelle Gross, M.D., Ph.D.         Thea Silversmith, M.D.         Wyvonnia Lora, M.D Weekly Treatment Management Note  Name: Steve [name]     MRN: [id number]        CSN: [id number] Date: 01/13/2013      DOB: [birth year]  CC: [name], MD         [facility] [place]    Status: Outpatient  Diagnosis: The encounter diagnosis was Lung cancer, right.  Current Dose: 10.8 Gy  Current Fraction: 6  Planned Dose: 50.4 Gy  Narrative: Steve [name] was seen today for weekly treatment management. The chart was checked and CBCT  were reviewed. He is tolerating treatments well thus far. He denies any problems with swallowing or difficulty with swallowing. Patient did feel rough over the weekend after receiving chemotherapy late last week.  Review of patient's allergies indicates no known allergies. Current Outpatient Prescriptions  Medication Sig Dispense Refill  . hyaluronate sodium (RADIAPLEXRX) GEL Apply 1 application topically 2 (two) times daily.      Marland Kitchen ibuprofen (ADVIL,MOTRIN) 200 MG tablet Take 200 mg by mouth every 6 (six) hours as needed.      . meloxicam (MOBIC) 15 MG tablet Take 15 mg by mouth daily.      . prochlorperazine (COMPAZINE) 10 MG tablet Take 1 tablet (10 mg total) by mouth every 6 (six) hours as needed for nausea or vomiting.  30 tablet  2  . simvastatin (ZOCOR) 20 MG tablet Take one tablet by mouth once daily to lower cholesterol  90 tablet  3   No current facility-administered medications for this encounter.   Labs:  Lab Results  Component Value Date   WBC 6.7 01/12/2013   HGB 14.4 01/12/2013   HCT 43.2 01/12/2013   MCV 88.7 01/12/2013   PLT 352 01/12/2013   Lab Results  Component Value Date   CREATININE 0.9 01/12/2013   BUN 22.5 01/12/2013   NA 141 01/12/2013   K 4.0 01/12/2013   CL 103 11/17/2012   CO2 25 01/12/2013   Lab Results  Component Value Date   ALT 19 01/12/2013   AST 14 01/12/2013   BILITOT 0.34 01/12/2013    Physical Examination:  Filed Vitals:   01/13/13 1534  BP: 137/84  Pulse: 84  Resp: 16    Wt Readings from Last 3 Encounters:  01/13/13 206 lb 8 oz (93.668 kg)  01/06/13 207 lb 11.2 oz (94.212 kg)  12/25/12 207 lb 3.2 oz (93.985 kg)     Lungs - Normal respiratory effort, chest expands symmetrically. Lungs are clear to auscultation, no crackles or wheezes.  Heart has regular rhythm and rate  Abdomen is soft and non tender with normal bowel sounds  Assessment:  Patient tolerating treatments well  Plan: Continue treatment per original radiation prescription

## 2013-01-14 ENCOUNTER — Ambulatory Visit
Admission: RE | Admit: 2013-01-14 | Discharge: 2013-01-14 | Disposition: A | Discharge status: Home / Self Care | Payer: Medicare Other | Source: Ambulatory Visit | Attending: Radiation Oncology | Admitting: Radiation Oncology

## 2013-01-14 ENCOUNTER — Telehealth: Payer: Self-pay | Admitting: *Deleted

## 2013-01-14 NOTE — Telephone Encounter (Signed)
Patient called and stated that he thinks he is having circulation problems in his hands and wants something called it for it. Said they stay cold and numb all the time. Told patient that he needed an appointment to evaluate this before we can just call in a new medication. Patient stated that he didn't want to be seen and he would just go by the pharmacy and have them suggest something. Patient hung up.

## 2013-01-14 NOTE — Telephone Encounter (Signed)
i would advise to schedule an appointment in the clinic

## 2013-01-15 ENCOUNTER — Ambulatory Visit
Admission: RE | Admit: 2013-01-15 | Discharge: 2013-01-15 | Disposition: A | Discharge status: Home / Self Care | Payer: Medicare Other | Source: Ambulatory Visit | Attending: Radiation Oncology | Admitting: Radiation Oncology

## 2013-01-15 MED ORDER — TAMSULOSIN HCL 0.4 MG PO CAPS: 0.4000 mg | ORAL_CAPSULE | Freq: Every day | ORAL | Status: DC

## 2013-01-15 NOTE — Telephone Encounter (Signed)
Patient Adviced

## 2013-01-16 ENCOUNTER — Ambulatory Visit
Admission: RE | Admit: 2013-01-16 | Discharge: 2013-01-16 | Disposition: A | Discharge status: Home / Self Care | Payer: Medicare Other | Source: Ambulatory Visit | Attending: Radiation Oncology | Admitting: Radiation Oncology

## 2013-01-16 ENCOUNTER — Telehealth: Payer: Self-pay | Admitting: *Deleted

## 2013-01-16 NOTE — Telephone Encounter (Signed)
Spoke with Steve [name], PT yesterday regarding follow up from [facility].  Pt had complained of shoulder pain and plantar facitis.  I asked how he was feeling regarding these issues.  He stated he was ok and and does not need any assistance with these issues at this time.  I stated to call if we could.  He verbalized understanding.

## 2013-01-19 ENCOUNTER — Ambulatory Visit (HOSPITAL_BASED_OUTPATIENT_CLINIC_OR_DEPARTMENT_OTHER): Payer: Medicare HMO

## 2013-01-19 ENCOUNTER — Encounter: Payer: Self-pay | Admitting: Physician Assistant

## 2013-01-19 ENCOUNTER — Ambulatory Visit
Admission: RE | Admit: 2013-01-19 | Discharge: 2013-01-19 | Disposition: A | Discharge status: Home / Self Care | Payer: Medicare Other | Source: Ambulatory Visit | Attending: Radiation Oncology | Admitting: Radiation Oncology

## 2013-01-19 ENCOUNTER — Ambulatory Visit (HOSPITAL_BASED_OUTPATIENT_CLINIC_OR_DEPARTMENT_OTHER): Payer: Medicare HMO | Admitting: Physician Assistant

## 2013-01-19 ENCOUNTER — Telehealth: Payer: Self-pay | Admitting: Internal Medicine

## 2013-01-19 DIAGNOSIS — Z5111 Encounter for antineoplastic chemotherapy: Secondary | ICD-10-CM

## 2013-01-19 DIAGNOSIS — C3430 Malignant neoplasm of lower lobe, unspecified bronchus or lung: Secondary | ICD-10-CM

## 2013-01-19 LAB — COMPREHENSIVE METABOLIC PANEL (CC13)
ALBUMIN: 3.3 g/dL — AB (ref 3.5–5.0)
ALK PHOS: 83 U/L (ref 40–150)
ALT: 20 U/L (ref 0–55)
AST: 17 U/L (ref 5–34)
Anion Gap: 13 mEq/L — ABNORMAL HIGH (ref 3–11)
BILIRUBIN TOTAL: 0.33 mg/dL (ref 0.20–1.20)
BUN: 19.7 mg/dL (ref 7.0–26.0)
CO2: 22 mEq/L (ref 22–29)
Calcium: 9.4 mg/dL (ref 8.4–10.4)
Chloride: 106 mEq/L (ref 98–109)
Creatinine: 1 mg/dL (ref 0.7–1.3)
Glucose: 75 mg/dl (ref 70–140)
POTASSIUM: 4.1 meq/L (ref 3.5–5.1)
SODIUM: 141 meq/L (ref 136–145)
TOTAL PROTEIN: 7.4 g/dL (ref 6.4–8.3)

## 2013-01-19 LAB — CBC WITH DIFFERENTIAL/PLATELET
BASO%: 0.2 % (ref 0.0–2.0)
Basophils Absolute: 0 10*3/uL (ref 0.0–0.1)
EOS%: 2 % (ref 0.0–7.0)
Eosinophils Absolute: 0.1 10*3/uL (ref 0.0–0.5)
HEMATOCRIT: 42.4 % (ref 38.4–49.9)
HGB: 13.9 g/dL (ref 13.0–17.1)
LYMPH%: 14.6 % (ref 14.0–49.0)
MCH: 29.3 pg (ref 27.2–33.4)
MCHC: 32.8 g/dL (ref 32.0–36.0)
MCV: 89.5 fL (ref 79.3–98.0)
MONO#: 0.6 10*3/uL (ref 0.1–0.9)
MONO%: 11.9 % (ref 0.0–14.0)
NEUT#: 3.5 10*3/uL (ref 1.5–6.5)
NEUT%: 71.3 % (ref 39.0–75.0)
PLATELETS: 333 10*3/uL (ref 140–400)
RBC: 4.74 10*6/uL (ref 4.20–5.82)
RDW: 13.8 % (ref 11.0–14.6)
WBC: 4.9 10*3/uL (ref 4.0–10.3)
lymph#: 0.7 10*3/uL — ABNORMAL LOW (ref 0.9–3.3)

## 2013-01-19 MED ORDER — SODIUM CHLORIDE 0.9 % IV SOLN
230.0000 mg | Freq: Once | INTRAVENOUS | Status: AC
  Administered 2013-01-19: 230 mg via INTRAVENOUS
  Filled 2013-01-19: qty 23

## 2013-01-19 MED ORDER — SODIUM CHLORIDE 0.9 % IV SOLN
45.0000 mg/m2 | Freq: Once | INTRAVENOUS | Status: AC
  Administered 2013-01-19: 96 mg via INTRAVENOUS
  Filled 2013-01-19: qty 16

## 2013-01-19 MED ORDER — FAMOTIDINE IN NACL 20-0.9 MG/50ML-% IV SOLN
20.0000 mg | Freq: Once | INTRAVENOUS | Status: AC
  Administered 2013-01-19: 20 mg via INTRAVENOUS

## 2013-01-19 MED ORDER — FAMOTIDINE IN NACL 20-0.9 MG/50ML-% IV SOLN
INTRAVENOUS | Status: AC
  Filled 2013-01-19: qty 50

## 2013-01-19 MED ORDER — ONDANSETRON 16 MG/50ML IVPB (CHCC)
INTRAVENOUS | Status: AC
  Filled 2013-01-19: qty 16

## 2013-01-19 MED ORDER — DEXAMETHASONE SODIUM PHOSPHATE 20 MG/5ML IJ SOLN
20.0000 mg | Freq: Once | INTRAMUSCULAR | Status: AC
  Administered 2013-01-19: 20 mg via INTRAVENOUS

## 2013-01-19 MED ORDER — SODIUM CHLORIDE 0.9 % IV SOLN
Freq: Once | INTRAVENOUS | Status: AC
  Administered 2013-01-19: 16:00:00 via INTRAVENOUS

## 2013-01-19 MED ORDER — ONDANSETRON 16 MG/50ML IVPB (CHCC)
16.0000 mg | Freq: Once | INTRAVENOUS | Status: AC
  Administered 2013-01-19: 16 mg via INTRAVENOUS

## 2013-01-19 MED ORDER — DIPHENHYDRAMINE HCL 50 MG/ML IJ SOLN
50.0000 mg | Freq: Once | INTRAMUSCULAR | Status: AC
  Administered 2013-01-19: 50 mg via INTRAVENOUS

## 2013-01-19 MED ORDER — DIPHENHYDRAMINE HCL 50 MG/ML IJ SOLN
INTRAMUSCULAR | Status: AC
  Filled 2013-01-19: qty 1

## 2013-01-19 MED ORDER — DEXAMETHASONE SODIUM PHOSPHATE 20 MG/5ML IJ SOLN
INTRAMUSCULAR | Status: AC
  Filled 2013-01-19: qty 5

## 2013-01-19 NOTE — Telephone Encounter (Signed)
Pt insist that he already has appts thatb he has not stop by @ scheduling before , POF was not in ML is still working on it

## 2013-01-19 NOTE — Patient Instructions (Signed)
[facility] Discharge Instructions for Patients Receiving Chemotherapy  Today you received the following chemotherapy agents: Taxol, Carboplatin  To help prevent nausea and vomiting after your treatment, we encourage you to take your nausea medication as prescribed.   If you develop nausea and vomiting that is not controlled by your nausea medication, call the clinic.   BELOW ARE SYMPTOMS THAT SHOULD BE REPORTED IMMEDIATELY:  *FEVER GREATER THAN 100.5 F  *CHILLS WITH OR WITHOUT FEVER  NAUSEA AND VOMITING THAT IS NOT CONTROLLED WITH YOUR NAUSEA MEDICATION  *UNUSUAL SHORTNESS OF BREATH  *UNUSUAL BRUISING OR BLEEDING  TENDERNESS IN MOUTH AND THROAT WITH OR WITHOUT PRESENCE OF ULCERS  *URINARY PROBLEMS  *BOWEL PROBLEMS  UNUSUAL RASH Items with * indicate a potential emergency and should be followed up as soon as possible.  Feel free to call the clinic you have any questions or concerns. The clinic phone number is [phone].

## 2013-01-19 NOTE — Progress Notes (Addendum)
No images are attached to the encounter. No scans are attached to the encounter. No scans are attached to the encounter. [facility] SHARED VISIT PROGRESS NOTE  [place], [place], [place] [address]  DIAGNOSIS: Lung cancer   Primary site: Lung (Right)   Staging method: AJCC 7th Edition   Clinical: Stage IIIA (T2b, N2, M0)   Summary: Stage IIIA (T2b, N2, M0)  PRIOR THERAPY: None  CURRENT THERAPY: Concurrent chemoradiation with weekly carboplatin for an AUC of 2 and paclitaxel 45 mg/m2 for total of 7 weeks dependent on date of final fraction of radiation  DISEASE STAGE: Lung cancer   Primary site: Lung (Right)   Staging method: AJCC 7th Edition   Clinical: Stage IIIA (T2b, N2, M0)   Summary: Stage IIIA (T2b, N2, M0)  CHEMOTHERAPY INTENT: Control/Palliative  CURRENT # OF CHEMOTHERAPY CYCLES: 3  CURRENT ANTIEMETICS: Zofran, dexamethasone, Compazine  CURRENT SMOKING STATUS: Former smoker, quit 01/09/2007  ORAL CHEMOTHERAPY AND CONSENT: n/a  CURRENT BISPHOSPHONATES USE:  none  PAIN MANAGEMENT: Ibuprofen  NARCOTICS INDUCED CONSTIPATION: None  LIVING WILL AND CODE STATUS: ?   INTERVAL HISTORY: Steve [name] 73 y.o. 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 He is currently undergoing a course of concurrent chemoradiation. He states that he had significant fatigue and malaise approximately 3 days after his first cycle of chemotherapy. He found it difficult to walk a mile with his wife and had shortness of breath with exertion as well as a feeling of just having no energy. He has discontinued caffeine and is trying to drink 64 ounces of fluids as he was instructed. He is using any hydration powder, crit drop that he is used when he visits his daughter in [place]. He states that he gets up at its  thickness in this tends to help him stay hydrated. Prior to using this hydration powder, he was having to get up extremely frequently during the night to urinate. He does report that she did not have similar symptoms after the second weekly cycle of chemotherapy. Today he voices no specific complaints. He denied fever, chills, cough, hemoptysis, diarrhea, constipation or night sweats.  MEDICAL HISTORY: Past Medical History  Diagnosis Date  . Dizziness and giddiness     positional vertigo  . Malignant neoplasm of bladder, part unspecified   . Hyperlipidemia   . Impotence of organic origin   . Osteoarthritis, shoulder   . Allergy   . Diabetes mellitus without complication     pt denies diabetes, dr pandey note says dm 11-19-12 epic  . Hypothyroidism     pt denies thryoid disease, dr pandey note 11-19-12 says subclinical hypothryoid in epic  . COPD (chronic obstructive pulmonary disease)     ALLERGIES:  has No Known Allergies.  MEDICATIONS:  Current Outpatient Prescriptions  Medication Sig Dispense Refill  . ibuprofen (ADVIL,MOTRIN) 200 MG tablet Take 200 mg by mouth every 6 (six) hours as needed.      . simvastatin (ZOCOR) 20 MG tablet Take one tablet by mouth once daily to lower cholesterol  90 tablet  3  . tamsulosin (FLOMAX) 0.4 MG CAPS capsule Take 1 capsule (0.4 mg total) by mouth at bedtime.  30 capsule  0  . hyaluronate sodium (RADIAPLEXRX) GEL Apply 1 application topically 2 (two) times daily.      . meloxicam (  MOBIC) 15 MG tablet Take 15 mg by mouth daily.      . prochlorperazine (COMPAZINE) 10 MG tablet Take 1 tablet (10 mg total) by mouth every 6 (six) hours as needed for nausea or vomiting.  30 tablet  2   No current facility-administered medications for this visit.   Facility-Administered Medications Ordered in Other Visits  Medication Dose Route Frequency Provider Last Rate Last Dose  . CARBOplatin (PARAPLATIN) 230 mg in sodium chloride 0.9 % 100 mL chemo infusion  230  mg Intravenous Once Curt Bears, MD      . PACLitaxel (TAXOL) 96 mg in sodium chloride 0.9 % 250 mL chemo infusion (</= 80mg /m2)  45 mg/m2 (Treatment Plan Actual) Intravenous Once Curt Bears, MD 266 mL/hr at 01/19/13 1640 96 mg at 01/19/13 1640    SURGICAL HISTORY:  Past Surgical History  Procedure Laterality Date  . Cystoscopy w/ dilation of bladder    . Tonsillectomy  age 73  . Endobronchial ultrasound Bilateral 12/15/2012    Procedure: ENDOBRONCHIAL ULTRASOUND;  Surgeon: Brand Males, MD;  Location: [facility];  Service: Cardiopulmonary;  Laterality: Bilateral;    REVIEW OF SYSTEMS:  Constitutional: positive for fatigue and malaise Eyes: negative Ears, nose, mouth, throat, and face: negative Respiratory: positive for dyspnea on exertion Cardiovascular: negative Gastrointestinal: negative Genitourinary:negative Integument/breast: negative Hematologic/lymphatic: negative Musculoskeletal:negative Neurological: negative Behavioral/Psych: negative Endocrine: negative   PHYSICAL EXAMINATION: General appearance: alert, cooperative, appears stated age and no distress Head: Normocephalic, without obvious abnormality, atraumatic Neck: no adenopathy, no carotid bruit, no JVD, supple, symmetrical, trachea midline and thyroid not enlarged, symmetric, no tenderness/mass/nodules Lymph nodes: Cervical, supraclavicular, and axillary nodes normal. Resp: clear to auscultation bilaterally Back: symmetric, no curvature. ROM normal. No CVA tenderness. Cardio: regular rate and rhythm, S1, S2 normal, no murmur, click, rub or gallop GI: soft, non-tender; bowel sounds normal; no masses,  no organomegaly Extremities: extremities normal, atraumatic, no cyanosis or edema Neurologic: Alert and oriented X 3, normal strength and tone. Normal symmetric reflexes. Normal coordination and gait  ECOG PERFORMANCE STATUS: 1 - Symptomatic but completely ambulatory  Blood pressure 120/80, pulse 100,  temperature 97.6 F (36.4 C), temperature source Oral, resp. rate 18, height 6' 0.5" (1.842 m), weight 206 lb 1.6 oz (93.486 kg).  LABORATORY DATA: Lab Results  Component Value Date   WBC 4.9 01/19/2013   HGB 13.9 01/19/2013   HCT 42.4 01/19/2013   MCV 89.5 01/19/2013   PLT 333 01/19/2013      Chemistry      Component Value Date/Time   NA 141 01/19/2013 1337   NA 143 11/17/2012 0805   K 4.1 01/19/2013 1337   K 4.8 11/17/2012 0805   CL 103 11/17/2012 0805   CO2 22 01/19/2013 1337   CO2 26 11/17/2012 0805   BUN 19.7 01/19/2013 1337   BUN 13 11/17/2012 0805   CREATININE 1.0 01/19/2013 1337   CREATININE 0.95 11/17/2012 0805      Component Value Date/Time   CALCIUM 9.4 01/19/2013 1337   CALCIUM 10.0 11/17/2012 0805   ALKPHOS 83 01/19/2013 1337   ALKPHOS 82 05/09/2012 0854   AST 17 01/19/2013 1337   AST 26 05/09/2012 0854   ALT 20 01/19/2013 1337   ALT 39 05/09/2012 0854   BILITOT 0.33 01/19/2013 1337   BILITOT 0.4 05/09/2012 0854       RADIOGRAPHIC STUDIES:  No results found.   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 The patient is currently undergoing a course of concurrent chemoradiation with weekly chemotherapy in the form of carboplatin for an AUC of 2 and paclitaxel 45 mg per meter square given weekly concurrent with radiation under the care of Dr. Sondra Come. Patient was discussed with and also seen by Dr. Julien Nordmann. He will continue his course of concurrent chemoradiation return in 2 weeks for another symptom management visit.     Wynetta Emery, Zayvion Stailey E, PA-C     All questions were answered. The patient knows to call the clinic with any problems, questions or concerns. We can certainly see the patient much sooner if  necessary.  ADDENDUM: Hematology/Oncology Attending: I had the face to face encounter with the patient. I recommended his care plan. This is a very pleasant 73 years old [place] male with history of stage IIIa non-small cell lung cancer currently undergoing concurrent chemoradiation with weekly carboplatin and paclitaxel, status post 2 cycles and tolerating it fairly well. He denied having any significant fever or chills, no nausea or vomiting. I recommended for the patient to proceed with cycle #3 today as scheduled. He would come back for followup visit in 2 weeks for evaluation and management any adverse effect of his treatment. He was advised to call immediately if he has any concerning symptoms in the interval.  Disclaimer: This note was dictated with voice recognition software. Similar sounding words can inadvertently be transcribed and may not be corrected upon review. Eilleen Kempf., MD 01/21/2013

## 2013-01-19 NOTE — Patient Instructions (Signed)
Continue your course of concurrent chemoradiation as scheduled Followup in 2 weeks

## 2013-01-19 NOTE — Patient Instructions (Signed)
[facility] Discharge Instructions for Patients Receiving Chemotherapy  Today you received the following chemotherapy agents:  Taxol & Carboplatin  To help prevent nausea and vomiting after your treatment, we encourage you to take your nausea medication If you develop nausea and vomiting that is not controlled by your nausea medication, call the clinic.   BELOW ARE SYMPTOMS THAT SHOULD BE REPORTED IMMEDIATELY:  *FEVER GREATER THAN 100.5 F  *CHILLS WITH OR WITHOUT FEVER  NAUSEA AND VOMITING THAT IS NOT CONTROLLED WITH YOUR NAUSEA MEDICATION  *UNUSUAL SHORTNESS OF BREATH  *UNUSUAL BRUISING OR BLEEDING  TENDERNESS IN MOUTH AND THROAT WITH OR WITHOUT PRESENCE OF ULCERS  *URINARY PROBLEMS  *BOWEL PROBLEMS  UNUSUAL RASH Items with * indicate a potential emergency and should be followed up as soon as possible.  Feel free to call the clinic you have any questions or concerns. The clinic phone number is [phone].

## 2013-01-20 ENCOUNTER — Ambulatory Visit
Admission: RE | Admit: 2013-01-20 | Discharge: 2013-01-20 | Disposition: A | Discharge status: Home / Self Care | Payer: Medicare Other | Source: Ambulatory Visit | Attending: Radiation Oncology | Admitting: Radiation Oncology

## 2013-01-20 ENCOUNTER — Other Ambulatory Visit: Payer: Self-pay

## 2013-01-20 VITALS — BP 121/91 | HR 130 | Temp 97.5°F | Ht 72.5 in | Wt 207.5 lb

## 2013-01-20 DIAGNOSIS — C3490 Malignant neoplasm of unspecified part of unspecified bronchus or lung: Secondary | ICD-10-CM

## 2013-01-20 NOTE — Addendum Note (Signed)
Encounter addended by: Jacqulyn Liner, RN on: 01/20/2013  4:40 PM<BR>     Documentation filed: Notes Section

## 2013-01-20 NOTE — Progress Notes (Signed)
[facility]     Rexene Edison, M.D. [place], [place] [address]               Blair Promise, M.D., Ph.D. Phone: [phone]      [place] A. Tammi Klippel, M.D. Fax: [phone]      Jodelle Gross, M.D., Ph.D.         Thea Silversmith, M.D.         Wyvonnia Lora, M.D Weekly Treatment Management Note  Name: Steve [name]     MRN: [id number]        CSN: [id number] Date: 01/20/2013      DOB: 1940-03-05  CC: [name], MD         [facility]    Status: Outpatient  Diagnosis:  Stage III-A (T2b, N2, Mx) Squamous Cell Carcinoma of the Right Lung   Current Dose: 19.8 Gy  Current Fraction: 11  Planned Dose: 50.4 Gy  Narrative: Steve [name] was seen today for weekly treatment management. The chart was checked and CBCT  were reviewed. He is tolerating his treatments well this time. He does notice more dyspnea with exertion. He denies any chest pain.  He has noticed some discomfort with swallowing but is able to take in solid foods.   Review of patient's allergies indicates no known allergies.  Current Outpatient Prescriptions  Medication Sig Dispense Refill  . simvastatin (ZOCOR) 20 MG tablet Take one tablet by mouth once daily to lower cholesterol  90 tablet  3  . tamsulosin (FLOMAX) 0.4 MG CAPS capsule Take 1 capsule (0.4 mg total) by mouth at bedtime.  30 capsule  0  . hyaluronate sodium (RADIAPLEXRX) GEL Apply 1 application topically 2 (two) times daily.      Marland Kitchen ibuprofen (ADVIL,MOTRIN) 200 MG tablet Take 200 mg by mouth every 6 (six) hours as needed.      . meloxicam (MOBIC) 15 MG tablet Take 15 mg by mouth daily.      . prochlorperazine (COMPAZINE) 10 MG tablet Take 1 tablet (10 mg total) by mouth every 6 (six) hours as needed for nausea or vomiting.  30 tablet  2   No current facility-administered medications for this encounter.   Labs:  Lab Results  Component Value Date   WBC 4.9 01/19/2013   HGB 13.9 01/19/2013   HCT  42.4 01/19/2013   MCV 89.5 01/19/2013   PLT 333 01/19/2013   Lab Results  Component Value Date   CREATININE 1.0 01/19/2013   BUN 19.7 01/19/2013   NA 141 01/19/2013   K 4.1 01/19/2013   CL 103 11/17/2012   CO2 22 01/19/2013   Lab Results  Component Value Date   ALT 20 01/19/2013   AST 17 01/19/2013   BILITOT 0.33 01/19/2013    Physical Examination:  Filed Vitals:   01/20/13 1502  BP: 121/91  Pulse: 130  Temp: 97.5 F (36.4 C)    Wt Readings from Last 3 Encounters:  01/20/13 207 lb 8 oz (94.121 kg)  01/19/13 206 lb 1.6 oz (93.486 kg)  01/13/13 206 lb 8 oz (93.668 kg)     Lungs - Normal respiratory effort, chest expands symmetrically. Lungs are clear to auscultation, no crackles or wheezes.  Heart has an irregular rhythm with increased rate of approximately 130 Abdomen is soft and non tender with normal bowel sounds  Assessment:  Patient tolerating treatments well  Plan: Continue treatment per original radiation prescription.  The patient did undergo a EKG in our department which is consistent with atrial fibrillation with rapid ventricular response of 132. This appears to be new onset atrial fibrillation for the patient. I discussed this issue with Dr. Julien Nordmann as well as the patient in detail. I recommended  the patient presented to the emergency room.  He does not have a cardiologist.  After careful consideration the patient refuses to go to the emergency room. He does understand that he could develop clots within his heart and also develop a stroke from this situation.  He again refuses to go to the emergency room.  I've also recommended he not drive a car until this is evaluated and treated.

## 2013-01-20 NOTE — Progress Notes (Addendum)
Steve [name] has had 11 fractions to his right chest.  He denies pain.  He reports coughing up a "clump of blood" yesterday.  He says this is the first time he has done this.  He had chemotherapy yesterday.  He reports that he has trouble getting food to go down when he eats fast.  He has shortness of breath with activity.  His skin is intact in the treatment area.  His heart rate initially today was 130.  When retaken it was 115.  It is irregular.   EKG done which showed A Fib.  Dr. Sondra Come aware.

## 2013-01-21 ENCOUNTER — Ambulatory Visit
Admission: RE | Admit: 2013-01-21 | Discharge: 2013-01-21 | Disposition: A | Discharge status: Home / Self Care | Payer: Medicare Other | Source: Ambulatory Visit | Attending: Radiation Oncology | Admitting: Radiation Oncology

## 2013-01-21 ENCOUNTER — Telehealth: Payer: Self-pay | Admitting: Oncology

## 2013-01-21 ENCOUNTER — Telehealth: Payer: Self-pay | Admitting: *Deleted

## 2013-01-21 ENCOUNTER — Ambulatory Visit: Payer: Medicare Other | Admitting: Internal Medicine

## 2013-01-21 NOTE — Telephone Encounter (Signed)
Called Steve [name] to see how he is doing.  He says he is feeling fine.  He does not feel his heart racing.  He has made an appointment with a cardiologist for next week.  He is going to come by before treatment tomorrow at 2:00 to have his vital signs rechecked.

## 2013-01-21 NOTE — Telephone Encounter (Signed)
sw pt gv appts for 02/02/13 gv labs for 1pm and ov @ 1:30pm. Pt is aware that labs has been added tfor 02/09/13 and 02/16/13. He stated he will get an avs when he comes in on 01/26/13...td

## 2013-01-22 ENCOUNTER — Ambulatory Visit
Admission: RE | Admit: 2013-01-22 | Discharge: 2013-01-22 | Disposition: A | Discharge status: Home / Self Care | Payer: Medicare Other | Source: Ambulatory Visit | Attending: Radiation Oncology | Admitting: Radiation Oncology

## 2013-01-22 ENCOUNTER — Telehealth: Payer: Self-pay | Admitting: *Deleted

## 2013-01-22 MED ORDER — DILTIAZEM HCL ER COATED BEADS 180 MG PO CP24: 180.0000 mg | ORAL_CAPSULE | Freq: Every day | ORAL | Status: DC

## 2013-01-22 NOTE — Telephone Encounter (Signed)
EKG from 01/20/13 was faxed for review.  Patient has an upcoming appointment next Thurs with Dr Harrington Challenger.  Dr Rayann Heman reviewed and advised for patient to start on Cardizem 180mg  daily.  I have spoke with his wife and she is aware.  I also left a message for Enid Derry at the [facility] with the paln

## 2013-01-22 NOTE — Progress Notes (Addendum)
Steve [name] here to have his vital signs rechecked.  He denies pain, dizziness and chest pain.  BP 136/80.  Heart rate irregular - 95 then jumped to 128 and then down to 62.  He does not feel his heart beating fast or irregular.  He has an appointment with [name] on Thursday.  Will try to move it up.

## 2013-01-22 NOTE — Addendum Note (Signed)
Addended by: Janan Halter F on: 01/22/2013 03:52 PM   Modules accepted: Orders

## 2013-01-23 ENCOUNTER — Ambulatory Visit
Admission: RE | Admit: 2013-01-23 | Discharge: 2013-01-23 | Disposition: A | Discharge status: Home / Self Care | Payer: Medicare Other | Source: Ambulatory Visit | Attending: Radiation Oncology | Admitting: Radiation Oncology

## 2013-01-23 NOTE — Progress Notes (Signed)
Mr. Steve [name] stopped by the clinic after treatment.  He reports that he feels like food is "getting stuck in his chest."  He said he is not choking and the food eventually passes.  Advised him to eat soft foods and to eat slowly.

## 2013-01-26 ENCOUNTER — Other Ambulatory Visit: Payer: Self-pay | Admitting: Internal Medicine

## 2013-01-26 ENCOUNTER — Other Ambulatory Visit (HOSPITAL_BASED_OUTPATIENT_CLINIC_OR_DEPARTMENT_OTHER): Payer: Medicare HMO

## 2013-01-26 ENCOUNTER — Ambulatory Visit
Admission: RE | Admit: 2013-01-26 | Discharge: 2013-01-26 | Disposition: A | Discharge status: Home / Self Care | Payer: Medicare Other | Source: Ambulatory Visit | Attending: Radiation Oncology | Admitting: Radiation Oncology

## 2013-01-26 ENCOUNTER — Ambulatory Visit (HOSPITAL_BASED_OUTPATIENT_CLINIC_OR_DEPARTMENT_OTHER): Payer: Medicare HMO

## 2013-01-26 VITALS — BP 137/90 | HR 90 | Temp 96.8°F | Resp 18

## 2013-01-26 DIAGNOSIS — C3430 Malignant neoplasm of lower lobe, unspecified bronchus or lung: Secondary | ICD-10-CM

## 2013-01-26 DIAGNOSIS — C3490 Malignant neoplasm of unspecified part of unspecified bronchus or lung: Secondary | ICD-10-CM

## 2013-01-26 DIAGNOSIS — Z5111 Encounter for antineoplastic chemotherapy: Secondary | ICD-10-CM

## 2013-01-26 LAB — CBC WITH DIFFERENTIAL/PLATELET
BASO%: 0.8 % (ref 0.0–2.0)
BASOS ABS: 0 10*3/uL (ref 0.0–0.1)
EOS ABS: 0.1 10*3/uL (ref 0.0–0.5)
EOS%: 1.9 % (ref 0.0–7.0)
HCT: 41.5 % (ref 38.4–49.9)
HGB: 13.8 g/dL (ref 13.0–17.1)
LYMPH%: 10.1 % — AB (ref 14.0–49.0)
MCH: 29.4 pg (ref 27.2–33.4)
MCHC: 33.3 g/dL (ref 32.0–36.0)
MCV: 88.3 fL (ref 79.3–98.0)
MONO#: 0.5 10*3/uL (ref 0.1–0.9)
MONO%: 13.9 % (ref 0.0–14.0)
NEUT%: 73.3 % (ref 39.0–75.0)
NEUTROS ABS: 2.7 10*3/uL (ref 1.5–6.5)
PLATELETS: 354 10*3/uL (ref 140–400)
RBC: 4.7 10*6/uL (ref 4.20–5.82)
RDW: 14 % (ref 11.0–14.6)
WBC: 3.7 10*3/uL — ABNORMAL LOW (ref 4.0–10.3)
lymph#: 0.4 10*3/uL — ABNORMAL LOW (ref 0.9–3.3)

## 2013-01-26 LAB — COMPREHENSIVE METABOLIC PANEL (CC13)
ALBUMIN: 3.3 g/dL — AB (ref 3.5–5.0)
ALK PHOS: 81 U/L (ref 40–150)
ALT: 19 U/L (ref 0–55)
AST: 13 U/L (ref 5–34)
Anion Gap: 12 mEq/L — ABNORMAL HIGH (ref 3–11)
BILIRUBIN TOTAL: 0.3 mg/dL (ref 0.20–1.20)
BUN: 20.5 mg/dL (ref 7.0–26.0)
CO2: 23 mEq/L (ref 22–29)
Calcium: 9.2 mg/dL (ref 8.4–10.4)
Chloride: 104 mEq/L (ref 98–109)
Creatinine: 0.8 mg/dL (ref 0.7–1.3)
GLUCOSE: 190 mg/dL — AB (ref 70–140)
POTASSIUM: 4 meq/L (ref 3.5–5.1)
Sodium: 139 mEq/L (ref 136–145)
Total Protein: 7.1 g/dL (ref 6.4–8.3)

## 2013-01-26 MED ORDER — DEXAMETHASONE SODIUM PHOSPHATE 20 MG/5ML IJ SOLN
INTRAMUSCULAR | Status: AC
  Filled 2013-01-26: qty 5

## 2013-01-26 MED ORDER — SODIUM CHLORIDE 0.9 % IV SOLN
45.0000 mg/m2 | Freq: Once | INTRAVENOUS | Status: AC
  Administered 2013-01-26: 96 mg via INTRAVENOUS
  Filled 2013-01-26: qty 16

## 2013-01-26 MED ORDER — DIPHENHYDRAMINE HCL 50 MG/ML IJ SOLN
INTRAMUSCULAR | Status: AC
Start: 2013-01-26 — End: 2013-01-26
  Filled 2013-01-26: qty 1

## 2013-01-26 MED ORDER — SUCRALFATE 1 GM/10ML PO SUSP: 1.0000 g | Freq: Three times a day (TID) | ORAL | Status: DC

## 2013-01-26 MED ORDER — DEXAMETHASONE SODIUM PHOSPHATE 20 MG/5ML IJ SOLN
20.0000 mg | Freq: Once | INTRAMUSCULAR | Status: AC
  Administered 2013-01-26: 20 mg via INTRAVENOUS

## 2013-01-26 MED ORDER — HYDROCODONE-ACETAMINOPHEN 5-325 MG PO TABS: 1.0000 | ORAL_TABLET | Freq: Four times a day (QID) | ORAL | Status: DC | PRN

## 2013-01-26 MED ORDER — SODIUM CHLORIDE 0.9 % IV SOLN
Freq: Once | INTRAVENOUS | Status: AC
  Administered 2013-01-26: 15:00:00 via INTRAVENOUS

## 2013-01-26 MED ORDER — ONDANSETRON 16 MG/50ML IVPB (CHCC)
16.0000 mg | Freq: Once | INTRAVENOUS | Status: AC
  Administered 2013-01-26: 16 mg via INTRAVENOUS

## 2013-01-26 MED ORDER — SODIUM CHLORIDE 0.9 % IV SOLN
230.0000 mg | Freq: Once | INTRAVENOUS | Status: AC
  Administered 2013-01-26: 230 mg via INTRAVENOUS
  Filled 2013-01-26: qty 23

## 2013-01-26 MED ORDER — FAMOTIDINE IN NACL 20-0.9 MG/50ML-% IV SOLN
20.0000 mg | Freq: Once | INTRAVENOUS | Status: AC
  Administered 2013-01-26: 20 mg via INTRAVENOUS

## 2013-01-26 MED ORDER — DIPHENHYDRAMINE HCL 50 MG/ML IJ SOLN
50.0000 mg | Freq: Once | INTRAMUSCULAR | Status: AC
  Administered 2013-01-26: 50 mg via INTRAVENOUS

## 2013-01-26 MED ORDER — FAMOTIDINE IN NACL 20-0.9 MG/50ML-% IV SOLN
INTRAVENOUS | Status: AC
  Filled 2013-01-26: qty 50

## 2013-01-26 NOTE — Patient Instructions (Signed)
[facility] Discharge Instructions for Patients Receiving Chemotherapy  Today you received the following chemotherapy agents: Taxol and Carboplatin.  To help prevent nausea and vomiting after your treatment, we encourage you to take your nausea medication as prescribed.   If you develop nausea and vomiting that is not controlled by your nausea medication, call the clinic.   BELOW ARE SYMPTOMS THAT SHOULD BE REPORTED IMMEDIATELY:  *FEVER GREATER THAN 100.5 F  *CHILLS WITH OR WITHOUT FEVER  NAUSEA AND VOMITING THAT IS NOT CONTROLLED WITH YOUR NAUSEA MEDICATION  *UNUSUAL SHORTNESS OF BREATH  *UNUSUAL BRUISING OR BLEEDING  TENDERNESS IN MOUTH AND THROAT WITH OR WITHOUT PRESENCE OF ULCERS  *URINARY PROBLEMS  *BOWEL PROBLEMS  UNUSUAL RASH Items with * indicate a potential emergency and should be followed up as soon as possible.  Feel free to call the clinic you have any questions or concerns. The clinic phone number is [phone].    

## 2013-01-27 ENCOUNTER — Ambulatory Visit
Admission: RE | Admit: 2013-01-27 | Discharge: 2013-01-27 | Disposition: A | Discharge status: Home / Self Care | Payer: Medicare Other | Source: Ambulatory Visit | Attending: Radiation Oncology | Admitting: Radiation Oncology

## 2013-01-27 VITALS — BP 143/73 | HR 86 | Temp 97.6°F | Ht 72.5 in | Wt 204.9 lb

## 2013-01-27 DIAGNOSIS — C3490 Malignant neoplasm of unspecified part of unspecified bronchus or lung: Secondary | ICD-10-CM

## 2013-01-27 NOTE — Progress Notes (Signed)
Steve [name] has had 16 fractions to his right chest.  He denies pain.  He reports that his food is getting stuck in his chest.  He has started to take the carafate last night.  He reports frequent hiccups.  He denies a cough and shortness of breath.  He reports nose bleeds in the mornings.  His heart rate today was 86 and regular.   He is taking Cardizem.  He reports night sweats.  He has not started using radiaplex yet.  He does have some redness on his chest and right upper back.  Advised him to start using the radiaplex twice a day.

## 2013-01-27 NOTE — Progress Notes (Signed)
[facility]     Rexene Edison, M.D. [place], [place] [address]               Blair Promise, M.D., Ph.D. Phone: [phone]      [place] A. Tammi Klippel, M.D. Fax: [phone]      Jodelle Gross, M.D., Ph.D.         Thea Silversmith, M.D.         Wyvonnia Lora, M.D Weekly Treatment Management Note  Name: Steve [name]     MRN: [id number]        CSN: [id number] Date: 01/27/2013      DOB: [birth year]  CC: Steve Serve, MD         Steve [name]    Status: Outpatient  Diagnosis: The encounter diagnosis was Lung cancer.  Current Dose: 28.8 Gy   Current Fraction: 16  Planned Dose: 50.4 Gy  Narrative: Steve [name] was seen today for weekly treatment management. The chart was checked and CBCT  were reviewed. He is having some esophageal symptoms but was started on Carafate. He is taking Zantac helps with his reflux issues. Patient was started on cardiac exam by cardiology which has placed the patient back in normal sinus rhythm.  Review of patient's allergies indicates no known allergies.  Current Outpatient Prescriptions  Medication Sig Dispense Refill  . diltiazem (CARDIZEM CD) 180 MG 24 hr capsule Take 1 capsule (180 mg total) by mouth daily.  90 capsule  3  . hyaluronate sodium (RADIAPLEXRX) GEL Apply 1 application topically 2 (two) times daily.      Marland Kitchen ibuprofen (ADVIL,MOTRIN) 200 MG tablet Take 200 mg by mouth every 6 (six) hours as needed.      . ranitidine (ZANTAC) 150 MG tablet Take 150 mg by mouth at bedtime.      . simvastatin (ZOCOR) 20 MG tablet Take one tablet by mouth once daily to lower cholesterol  90 tablet  3  . sucralfate (CARAFATE) 1 GM/10ML suspension Take 10 mLs (1 g total) by mouth 4 (four) times daily -  with meals and at bedtime.  420 mL  0  . tamsulosin (FLOMAX) 0.4 MG CAPS capsule Take 1 capsule (0.4 mg total) by mouth at bedtime.  30 capsule  0  . HYDROcodone-acetaminophen (NORCO) 5-325 MG per tablet Take  1 tablet by mouth every 6 (six) hours as needed for moderate pain.  30 tablet  0  . meloxicam (MOBIC) 15 MG tablet Take 15 mg by mouth daily.      . prochlorperazine (COMPAZINE) 10 MG tablet Take 1 tablet (10 mg total) by mouth every 6 (six) hours as needed for nausea or vomiting.  30 tablet  2   No current facility-administered medications for this encounter.   Labs:  Lab Results  Component Value Date   WBC 3.7* 01/26/2013   HGB 13.8 01/26/2013   HCT 41.5 01/26/2013   MCV 88.3 01/26/2013   PLT 354 01/26/2013   Lab Results  Component Value Date   CREATININE 0.8 01/26/2013   BUN 20.5 01/26/2013   NA 139 01/26/2013   K 4.0 01/26/2013   CL 103 11/17/2012   CO2 23 01/26/2013   Lab Results  Component Value Date   ALT 19 01/26/2013   AST 13 01/26/2013   BILITOT 0.30 01/26/2013    Physical Examination:  Filed Vitals:   01/27/13 1515  BP: 143/73  Pulse: 86  Temp: 97.6  F (36.4 C)    Wt Readings from Last 3 Encounters:  01/27/13 204 lb 14.4 oz (92.942 kg)  01/20/13 207 lb 8 oz (94.121 kg)  01/19/13 206 lb 1.6 oz (93.486 kg)    The oral cavity is moist without secondary infection Lungs - Normal respiratory effort, chest expands symmetrically. Lungs are clear to auscultation, no crackles or wheezes.  Heart has regular rhythm and rate  Abdomen is soft and non tender with normal bowel sounds  Assessment:  Patient tolerating treatments well  Plan: Continue treatment per original radiation prescription

## 2013-01-28 ENCOUNTER — Ambulatory Visit
Admission: RE | Admit: 2013-01-28 | Discharge: 2013-01-28 | Disposition: A | Discharge status: Home / Self Care | Payer: Medicare Other | Source: Ambulatory Visit | Attending: Radiation Oncology | Admitting: Radiation Oncology

## 2013-01-29 ENCOUNTER — Ambulatory Visit (INDEPENDENT_AMBULATORY_CARE_PROVIDER_SITE_OTHER): Payer: Medicare HMO | Admitting: Internal Medicine

## 2013-01-29 ENCOUNTER — Encounter: Payer: Self-pay | Admitting: Internal Medicine

## 2013-01-29 ENCOUNTER — Ambulatory Visit
Admission: RE | Admit: 2013-01-29 | Discharge: 2013-01-29 | Disposition: A | Discharge status: Home / Self Care | Payer: Medicare Other | Source: Ambulatory Visit | Attending: Radiation Oncology | Admitting: Radiation Oncology

## 2013-01-29 ENCOUNTER — Encounter (INDEPENDENT_AMBULATORY_CARE_PROVIDER_SITE_OTHER): Payer: Medicare HMO

## 2013-01-29 ENCOUNTER — Encounter: Payer: Self-pay | Admitting: *Deleted

## 2013-01-29 VITALS — BP 114/64 | HR 62 | Ht 72.0 in | Wt 201.0 lb

## 2013-01-29 DIAGNOSIS — R7309 Other abnormal glucose: Secondary | ICD-10-CM

## 2013-01-29 DIAGNOSIS — I4891 Unspecified atrial fibrillation: Secondary | ICD-10-CM

## 2013-01-29 DIAGNOSIS — C3490 Malignant neoplasm of unspecified part of unspecified bronchus or lung: Secondary | ICD-10-CM

## 2013-01-29 LAB — TSH: TSH: 2.79 u[IU]/mL (ref 0.35–5.50)

## 2013-01-29 LAB — HEMOGLOBIN A1C: HEMOGLOBIN A1C: 7.3 % — AB (ref 4.6–6.5)

## 2013-01-29 NOTE — Progress Notes (Signed)
HPI Patient is a 73 yo who was referred for irregular HB  EKG on 1/13 showed atrial fibrillation The patient is on diltiazem and ASA He denies CP  Denies SOB  Denies palpitations.  Does have some dizziness but says he has a lot of problems with food/fluid intake due to esophagitis.  No Known Allergies  Current Outpatient Prescriptions  Medication Sig Dispense Refill  . diltiazem (CARDIZEM CD) 180 MG 24 hr capsule Take 1 capsule (180 mg total) by mouth daily.  90 capsule  3  . hyaluronate sodium (RADIAPLEXRX) GEL Apply 1 application topically 2 (two) times daily.      Marland Kitchen ibuprofen (ADVIL,MOTRIN) 200 MG tablet Take 200 mg by mouth every 6 (six) hours as needed.      . ranitidine (ZANTAC) 150 MG tablet Take 150 mg by mouth at bedtime.      . simvastatin (ZOCOR) 20 MG tablet Take one tablet by mouth once daily to lower cholesterol  90 tablet  3  . sucralfate (CARAFATE) 1 GM/10ML suspension Take 10 mLs (1 g total) by mouth 4 (four) times daily -  with meals and at bedtime.  420 mL  0  . tamsulosin (FLOMAX) 0.4 MG CAPS capsule Take 1 capsule (0.4 mg total) by mouth at bedtime.  30 capsule  0   No current facility-administered medications for this visit.    Past Medical History  Diagnosis Date  . Dizziness and giddiness     positional vertigo  . Malignant neoplasm of bladder, part unspecified   . Hyperlipidemia   . Impotence of organic origin   . Osteoarthritis, shoulder   . Allergy   . Diabetes mellitus without complication     pt denies diabetes, dr pandey note says dm 11-19-12 epic  . Hypothyroidism     pt denies thryoid disease, dr pandey note 11-19-12 says subclinical hypothryoid in epic  . COPD (chronic obstructive pulmonary disease)     Past Surgical History  Procedure Laterality Date  . Cystoscopy w/ dilation of bladder    . Tonsillectomy  age 78  . Endobronchial ultrasound Bilateral 12/15/2012    Procedure: ENDOBRONCHIAL ULTRASOUND;  Surgeon: Brand Males, MD;  Location: [facility]  ENDOSCOPY;  Service: Cardiopulmonary;  Laterality: Bilateral;    Family History  Problem Relation Age of Onset  . Kidney disease Brother     History   Social History  . Marital Status: Married    Spouse Name: N/A    Number of Children: N/A  . Years of Education: N/A   Occupational History  . business owner    Social History Main Topics  . Smoking status: Former Smoker -- 1.50 packs/day for 50 years    Types: Cigarettes    Quit date: 01/09/2007  . Smokeless tobacco: Current User    Types: Chew  . Alcohol Use: Yes     Comment: 4-5 beers a month, 1 mixed drink every 2 months  . Drug Use: No  . Sexual Activity: Not on file   Other Topics Concern  . Not on file   Social History Narrative  . No narrative on file    Review of Systems:  All systems reviewed.  They are negative to the above problem except as previously stated.  Vital Signs: BP 114/64  Pulse 62  Ht 6' (1.829 m)  Wt 201 lb (91.173 kg)  BMI 27.25 kg/m2  Physical Exam Patient is in NAD HEENT:  Normocephalic, atraumatic. EOMI, PERRLA.  Neck: JVP is normal.  No  bruits.  Lungs: clear to auscultation. No rales no wheezes.  Heart: Irregular rate and rhythm. Normal S1, S2. No S3.   No significant murmurs. PMI not displaced.  Abdomen:  Supple, nontender. Normal bowel sounds. No masses. No hepatomegaly.  Extremities:   Good distal pulses throughout. No lower extremity edema.  Musculoskeletal :moving all extremities.  Neuro:   alert and oriented x3.  CN II-XII grossly intact.  EKG  1/13:  Atrial fibrillation  132 bpm  Nonspecific ST T wave changes    Assessment and Plan:  Impresion:  Patinet is a 73 yo with atrial fib  He has lung CA and is being treated. He is relatively asymtpomatic I would recomm checking TSH and Hgb A1C   Would check echo and holter monitor Continue ecASA 325 for now.  Continue dilt at current dose until holter back  Goal for average HR less than 100 F/u in clinic in May/june

## 2013-01-29 NOTE — Patient Instructions (Signed)
Your physician has requested that you have an echocardiogram. Echocardiography is a painless test that uses sound waves to create images of your heart. It provides your doctor with information about the size and shape of your heart and how well your heart's chambers and valves are working. This procedure takes approximately one hour. There are no restrictions for this procedure.  Your physician has recommended that you wear a holter monitor. Holter monitors are medical devices that record the heart's electrical activity. Doctors most often use these monitors to diagnose arrhythmias. Arrhythmias are problems with the speed or rhythm of the heartbeat. The monitor is a small, portable device. You can wear one while you do your normal daily activities. This is usually used to diagnose what is causing palpitations/syncope (passing out).  Your physician recommends that you have lab work: TSH (thyroid) and HgA1c (blood sugar)  Your physician recommends that you schedule a follow-up appointment in May or June, 2015

## 2013-01-29 NOTE — Progress Notes (Signed)
Patient ID: Steve [name], male   DOB: 1940-02-18, 72 y.o.   MRN: [id number] E-Cardio 24 hour holter monitor applied to patient.

## 2013-01-30 ENCOUNTER — Ambulatory Visit
Admission: RE | Admit: 2013-01-30 | Discharge: 2013-01-30 | Disposition: A | Discharge status: Home / Self Care | Payer: Medicare Other | Source: Ambulatory Visit | Attending: Radiation Oncology | Admitting: Radiation Oncology

## 2013-02-02 ENCOUNTER — Encounter: Payer: Self-pay | Admitting: Internal Medicine

## 2013-02-02 ENCOUNTER — Ambulatory Visit (HOSPITAL_BASED_OUTPATIENT_CLINIC_OR_DEPARTMENT_OTHER): Payer: Medicare HMO | Admitting: Internal Medicine

## 2013-02-02 ENCOUNTER — Other Ambulatory Visit (HOSPITAL_BASED_OUTPATIENT_CLINIC_OR_DEPARTMENT_OTHER): Payer: Medicare HMO

## 2013-02-02 ENCOUNTER — Ambulatory Visit: Payer: Medicare HMO | Admitting: Nutrition

## 2013-02-02 ENCOUNTER — Ambulatory Visit
Admission: RE | Admit: 2013-02-02 | Discharge: 2013-02-02 | Disposition: A | Discharge status: Home / Self Care | Payer: Medicare Other | Source: Ambulatory Visit | Attending: Radiation Oncology | Admitting: Radiation Oncology

## 2013-02-02 ENCOUNTER — Ambulatory Visit (HOSPITAL_BASED_OUTPATIENT_CLINIC_OR_DEPARTMENT_OTHER): Payer: Medicare HMO

## 2013-02-02 ENCOUNTER — Telehealth: Payer: Self-pay | Admitting: Internal Medicine

## 2013-02-02 ENCOUNTER — Other Ambulatory Visit: Payer: Medicare Other

## 2013-02-02 VITALS — BP 134/61 | HR 79 | Temp 97.4°F | Resp 18 | Ht 73.0 in | Wt 203.6 lb

## 2013-02-02 DIAGNOSIS — C3490 Malignant neoplasm of unspecified part of unspecified bronchus or lung: Secondary | ICD-10-CM

## 2013-02-02 DIAGNOSIS — R1310 Dysphagia, unspecified: Secondary | ICD-10-CM

## 2013-02-02 DIAGNOSIS — Z5111 Encounter for antineoplastic chemotherapy: Secondary | ICD-10-CM

## 2013-02-02 DIAGNOSIS — C3430 Malignant neoplasm of lower lobe, unspecified bronchus or lung: Secondary | ICD-10-CM

## 2013-02-02 LAB — COMPREHENSIVE METABOLIC PANEL (CC13)
ALBUMIN: 3.5 g/dL (ref 3.5–5.0)
ALK PHOS: 86 U/L (ref 40–150)
ALT: 30 U/L (ref 0–55)
AST: 18 U/L (ref 5–34)
Anion Gap: 10 mEq/L (ref 3–11)
BILIRUBIN TOTAL: 0.51 mg/dL (ref 0.20–1.20)
BUN: 16.5 mg/dL (ref 7.0–26.0)
CO2: 27 mEq/L (ref 22–29)
CREATININE: 0.8 mg/dL (ref 0.7–1.3)
Calcium: 9.6 mg/dL (ref 8.4–10.4)
Chloride: 105 mEq/L (ref 98–109)
GLUCOSE: 77 mg/dL (ref 70–140)
POTASSIUM: 3.9 meq/L (ref 3.5–5.1)
Sodium: 142 mEq/L (ref 136–145)
Total Protein: 7.2 g/dL (ref 6.4–8.3)

## 2013-02-02 LAB — CBC WITH DIFFERENTIAL/PLATELET
BASO%: 0.6 % (ref 0.0–2.0)
Basophils Absolute: 0 10*3/uL (ref 0.0–0.1)
EOS%: 2.3 % (ref 0.0–7.0)
Eosinophils Absolute: 0.1 10*3/uL (ref 0.0–0.5)
HCT: 39.1 % (ref 38.4–49.9)
HGB: 12.9 g/dL — ABNORMAL LOW (ref 13.0–17.1)
LYMPH#: 0.5 10*3/uL — AB (ref 0.9–3.3)
LYMPH%: 13 % — AB (ref 14.0–49.0)
MCH: 29.3 pg (ref 27.2–33.4)
MCHC: 33 g/dL (ref 32.0–36.0)
MCV: 88.9 fL (ref 79.3–98.0)
MONO#: 0.3 10*3/uL (ref 0.1–0.9)
MONO%: 8.8 % (ref 0.0–14.0)
NEUT#: 2.7 10*3/uL (ref 1.5–6.5)
NEUT%: 75.3 % — ABNORMAL HIGH (ref 39.0–75.0)
Platelets: 208 10*3/uL (ref 140–400)
RBC: 4.4 10*6/uL (ref 4.20–5.82)
RDW: 14.6 % (ref 11.0–14.6)
WBC: 3.5 10*3/uL — ABNORMAL LOW (ref 4.0–10.3)
nRBC: 0 % (ref 0–0)

## 2013-02-02 MED ORDER — SODIUM CHLORIDE 0.9 % IV SOLN
230.0000 mg | Freq: Once | INTRAVENOUS | Status: AC
  Administered 2013-02-02: 230 mg via INTRAVENOUS
  Filled 2013-02-02: qty 23

## 2013-02-02 MED ORDER — HYDROCODONE-ACETAMINOPHEN 7.5-325 MG/15ML PO SOLN: 10.0000 mL | Freq: Four times a day (QID) | ORAL | Status: DC | PRN

## 2013-02-02 MED ORDER — DEXAMETHASONE SODIUM PHOSPHATE 20 MG/5ML IJ SOLN
INTRAMUSCULAR | Status: AC
  Filled 2013-02-02: qty 5

## 2013-02-02 MED ORDER — FAMOTIDINE IN NACL 20-0.9 MG/50ML-% IV SOLN
20.0000 mg | Freq: Once | INTRAVENOUS | Status: AC
Start: 2013-02-02 — End: 2013-02-02
  Administered 2013-02-02: 20 mg via INTRAVENOUS

## 2013-02-02 MED ORDER — ONDANSETRON 16 MG/50ML IVPB (CHCC)
INTRAVENOUS | Status: AC
  Filled 2013-02-02: qty 16

## 2013-02-02 MED ORDER — DIPHENHYDRAMINE HCL 50 MG/ML IJ SOLN
INTRAMUSCULAR | Status: AC
  Filled 2013-02-02: qty 1

## 2013-02-02 MED ORDER — SODIUM CHLORIDE 0.9 % IV SOLN
45.0000 mg/m2 | Freq: Once | INTRAVENOUS | Status: AC
  Administered 2013-02-02: 96 mg via INTRAVENOUS
  Filled 2013-02-02: qty 16

## 2013-02-02 MED ORDER — ONDANSETRON 16 MG/50ML IVPB (CHCC)
16.0000 mg | Freq: Once | INTRAVENOUS | Status: AC
  Administered 2013-02-02: 16 mg via INTRAVENOUS

## 2013-02-02 MED ORDER — SODIUM CHLORIDE 0.9 % IV SOLN
Freq: Once | INTRAVENOUS | Status: AC
Start: 2013-02-02 — End: 2013-02-02
  Administered 2013-02-02: 14:00:00 via INTRAVENOUS

## 2013-02-02 MED ORDER — FAMOTIDINE IN NACL 20-0.9 MG/50ML-% IV SOLN
INTRAVENOUS | Status: AC
  Filled 2013-02-02: qty 50

## 2013-02-02 MED ORDER — TEMAZEPAM 15 MG PO CAPS: 15.0000 mg | ORAL_CAPSULE | Freq: Every evening | ORAL | Status: DC | PRN

## 2013-02-02 MED ORDER — DIPHENHYDRAMINE HCL 50 MG/ML IJ SOLN
50.0000 mg | Freq: Once | INTRAMUSCULAR | Status: AC
  Administered 2013-02-02: 50 mg via INTRAVENOUS

## 2013-02-02 MED ORDER — DEXAMETHASONE SODIUM PHOSPHATE 20 MG/5ML IJ SOLN
20.0000 mg | Freq: Once | INTRAMUSCULAR | Status: AC
  Administered 2013-02-02: 20 mg via INTRAVENOUS

## 2013-02-02 NOTE — Progress Notes (Signed)
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   Primary site: Lung (Right)  Staging method: AJCC 7th Edition  Clinical: Stage IIIA (T2b, N2, M0)  Summary: Stage IIIA (T2b, N2, M0)   PRIOR THERAPY: None   CURRENT THERAPY: Concurrent chemoradiation with weekly carboplatin for an AUC of 2 and paclitaxel 45 mg/m2, status post 4 cycles.  DISEASE STAGE:  Lung cancer  Primary site: Lung (Right)  Staging method: AJCC 7th Edition  Clinical: Stage IIIA (T2b, N2, M0)  Summary: Stage IIIA (T2b, N2, M0)  CHEMOTHERAPY INTENT: Control/Palliative  CURRENT # OF CHEMOTHERAPY CYCLES: 5 CURRENT ANTIEMETICS: Zofran, dexamethasone, Compazine  CURRENT SMOKING STATUS: Former smoker, quit 01/09/2007  ORAL CHEMOTHERAPY AND CONSENT: n/a  CURRENT BISPHOSPHONATES USE: none  PAIN MANAGEMENT: Ibuprofen  NARCOTICS INDUCED CONSTIPATION: None  LIVING WILL AND CODE STATUS: ?  INTERVAL HISTORY: Steve Steve [name] 73 y.o. Steve [name] returns to the clinic today for followup visit accompanied by his wife. The patient is feeling fine today with no specific complaints. He tolerated this course of concurrent chemoradiation fairly well except for odynophagia and he is currently taking Carafate and hydrocodone. He denied having any significant shortness of breath, cough or hemoptysis. He denied having any weight loss or night sweats. The patient has no fever or chills and no nausea or vomiting. He complains of insomnia and he would like to start some medication for this problem.  MEDICAL HISTORY: Past Medical History  Diagnosis Date  . Dizziness and giddiness     positional vertigo  . Malignant neoplasm of bladder, part unspecified   . Hyperlipidemia   .  Impotence of organic origin   . Osteoarthritis, shoulder   . Allergy   . Diabetes mellitus without complication     pt denies diabetes, dr pandey note says dm 11-19-12 epic  . Hypothyroidism     pt denies thryoid disease, dr pandey note 11-19-12 says subclinical hypothryoid in epic  . COPD (chronic obstructive pulmonary disease)     ALLERGIES:  has No Known Allergies.  MEDICATIONS:  Current Outpatient Prescriptions  Medication Sig Dispense Refill  . diltiazem (CARDIZEM CD) 180 MG 24 hr capsule Take 1 capsule (180 mg total) by mouth daily.  90 capsule  3  . hyaluronate sodium (RADIAPLEXRX) GEL Apply 1 application topically 2 (two) times daily.      Marland Kitchen HYDROcodone-acetaminophen (NORCO/VICODIN) 5-325 MG per tablet       . ibuprofen (ADVIL,MOTRIN) 200 MG tablet Take 200 mg by mouth every 6 (six) hours as needed.      . ranitidine (ZANTAC) 150 MG tablet Take 150 mg by mouth at bedtime.      . simvastatin (ZOCOR) 20 MG tablet Take one tablet by mouth once daily to lower cholesterol  90 tablet  3  . sucralfate (CARAFATE) 1 GM/10ML suspension Take 10 mLs (1 g total) by mouth 4 (four) times daily -  with meals and at bedtime.  420 mL  0  . tamsulosin (FLOMAX) 0.4 MG CAPS capsule Take 1 capsule (0.4 mg total) by mouth at bedtime.  30 capsule  0   No current facility-administered medications for this visit.    SURGICAL HISTORY:  Past Surgical History  Procedure Laterality Date  . Cystoscopy  w/ dilation of bladder    . Tonsillectomy  age 21  . Endobronchial ultrasound Bilateral 12/15/2012    Procedure: ENDOBRONCHIAL ULTRASOUND;  Surgeon: Brand Males, MD;  Location: [facility] ENDOSCOPY;  Service: Cardiopulmonary;  Laterality: Bilateral;    REVIEW OF SYSTEMS:  A comprehensive review of systems was negative except for: Gastrointestinal: positive for odynophagia   PHYSICAL EXAMINATION: General appearance: alert, cooperative and no distress Head: Normocephalic, without obvious abnormality,  atraumatic Neck: no adenopathy, no JVD, supple, symmetrical, trachea midline and thyroid not enlarged, symmetric, no tenderness/mass/nodules Lymph nodes: Cervical, supraclavicular, and axillary nodes normal. Resp: clear to auscultation bilaterally Back: symmetric, no curvature. ROM normal. No CVA tenderness. Cardio: regular rate and rhythm, S1, S2 normal, no murmur, click, rub or gallop GI: soft, non-tender; bowel sounds normal; no masses,  no organomegaly Extremities: extremities normal, atraumatic, no cyanosis or edema  ECOG PERFORMANCE STATUS: 1 - Symptomatic but completely ambulatory  Blood pressure 134/61, pulse 79, temperature 97.4 F (36.3 C), resp. rate 18, height 6\' 1"  (1.854 m), weight 203 lb 9.6 oz (92.352 kg), SpO2 100.00%.  LABORATORY DATA: Lab Results  Component Value Date   WBC 3.5* 02/02/2013   HGB 12.9* 02/02/2013   HCT 39.1 02/02/2013   MCV 88.9 02/02/2013   PLT 208 02/02/2013      Chemistry      Component Value Date/Time   NA 139 01/26/2013 1352   NA 143 11/17/2012 0805   K 4.0 01/26/2013 1352   K 4.8 11/17/2012 0805   CL 103 11/17/2012 0805   CO2 23 01/26/2013 1352   CO2 26 11/17/2012 0805   BUN 20.5 01/26/2013 1352   BUN 13 11/17/2012 0805   CREATININE 0.8 01/26/2013 1352   CREATININE 0.95 11/17/2012 0805      Component Value Date/Time   CALCIUM 9.2 01/26/2013 1352   CALCIUM 10.0 11/17/2012 0805   ALKPHOS 81 01/26/2013 1352   ALKPHOS 82 05/09/2012 0854   AST 13 01/26/2013 1352   AST 26 05/09/2012 0854   ALT 19 01/26/2013 1352   ALT 39 05/09/2012 0854   BILITOT 0.30 01/26/2013 1352   BILITOT 0.4 05/09/2012 0854       RADIOGRAPHIC STUDIES: No results found.  ASSESSMENT AND PLAN: This is a very pleasant 73 years old Steve Steve [name] with history of stage IIIa non-small cell lung cancer currently undergoing concurrent chemoradiation with weekly carboplatin and paclitaxel is status post 4 cycles. The patient is tolerating his treatment fairly well except for the radiation  induced odynophagia. He'll continue treatment with Carafate and hydrocodone. He is expected to complete this course of concurrent chemoradiation on 02/08/2013. I would see the patient back for followup visit in 6 weeks with repeat CT scan of the chest for restaging of his disease. He was advised to call immediately if he has any concerning symptoms in the interval. For insomnia, I will start the patient on Restoril 15 mg by mouth each bedtime an as-needed basis  The patient voices understanding of current disease status and treatment options and is in agreement with the current care plan.  All questions were answered. The patient knows to call the clinic with any problems, questions or concerns. We can certainly see the patient much sooner if necessary.  I spent 15 minutes counseling the patient face to face. The total time spent in the appointment was 25 minutes.  Disclaimer: This note was dictated with voice recognition software. Similar sounding words can inadvertently be transcribed and may not be corrected upon  review.

## 2013-02-02 NOTE — Progress Notes (Signed)
Patient is a 73 year old man, diagnosed with lung cancer receiving concurrent chemoradiation therapy.  He is a patient of Dr. Earlie Server.  Past medical history includes hyperlipidemia, osteoarthritis, hypothyroidism, COPD, malignant neoplasm of the bladder, and diabetes (patient denies).  Medications include Compazine, Zocor, and Carafate.  Labs include glucose of 190, and albumin 3.3.  Height: 6 feet. Weight: 203.6 pounds. Usual body weight: 215 pounds in May 2014. BMI: 26.7.  Patient reports his only problem is food gets stuck in his esophagus.  He states it doesn't matter whether it is liquids or solids.  Patient appears to be tolerating most foods except for bread.  He is asking questions about juice-based supplements instead of regular ensure and boost.  He would like samples.  Nutrition diagnosis: Unintended weight loss related to diagnosis of lung cancer and associated treatments as evidenced by 5% weight loss over 8 months..  Intervention: Patient was educated to consume softer, moister foods, as tolerated.  I encouraged patient to consume increased protein containing foods.  I did provide patient with samples of regular ensure and juice-based supplements such as ensure active.  I reviewed strategies for increasing oral intake to minimize further weight loss.  Fact Sheets were provided.  Questions were answered.  Teach back method used.  Monitoring, evaluation, goals: Patient will tolerate increased calories and protein to promote weight maintenance.  He will utilize oral nutrition supplements as needed.  Next visit: Patient has my contact information for further questions or concerns.

## 2013-02-02 NOTE — Patient Instructions (Signed)
[facility] Discharge Instructions for Patients Receiving Chemotherapy  Today you received the following chemotherapy agents: Taxol and Carboplatin.  To help prevent nausea and vomiting after your treatment, we encourage you to take your nausea medication as prescribed.   If you develop nausea and vomiting that is not controlled by your nausea medication, call the clinic.   BELOW ARE SYMPTOMS THAT SHOULD BE REPORTED IMMEDIATELY:  *FEVER GREATER THAN 100.5 F  *CHILLS WITH OR WITHOUT FEVER  NAUSEA AND VOMITING THAT IS NOT CONTROLLED WITH YOUR NAUSEA MEDICATION  *UNUSUAL SHORTNESS OF BREATH  *UNUSUAL BRUISING OR BLEEDING  TENDERNESS IN MOUTH AND THROAT WITH OR WITHOUT PRESENCE OF ULCERS  *URINARY PROBLEMS  *BOWEL PROBLEMS  UNUSUAL RASH Items with * indicate a potential emergency and should be followed up as soon as possible.  Feel free to call the clinic you have any questions or concerns. The clinic phone number is [phone].    

## 2013-02-02 NOTE — Telephone Encounter (Signed)
gv pt appt schedule for feb and march. central will call w/scan appt - pt aware. per 1/26 pof lb/tx this wk and next wk obnly. 2/9 lb/tx cx'd

## 2013-02-02 NOTE — Patient Instructions (Signed)
Continue chemotherapy this week and next week as scheduled.  Followup visit in 6 weeks with repeat CT scan of the chest.

## 2013-02-03 ENCOUNTER — Ambulatory Visit
Admission: RE | Admit: 2013-02-03 | Discharge: 2013-02-03 | Disposition: A | Discharge status: Home / Self Care | Payer: Medicare Other | Source: Ambulatory Visit | Attending: Radiation Oncology | Admitting: Radiation Oncology

## 2013-02-03 VITALS — BP 139/77 | HR 76 | Temp 97.2°F | Wt 205.0 lb

## 2013-02-03 DIAGNOSIS — C3490 Malignant neoplasm of unspecified part of unspecified bronchus or lung: Secondary | ICD-10-CM

## 2013-02-03 NOTE — Progress Notes (Signed)
Patient for weekly assessment of radiation to right chest.Completed 21 of 28 treatments.Has continued gurgling in chest.Currently takes zantac.States carafate not much help so will try liquid hycet prior to meals.Pain is only on swallowing.Slept better last night , will pick up medication for restoril and hycet today as was too tired last night.

## 2013-02-03 NOTE — Progress Notes (Signed)
[facility]     Rexene Edison, M.D. [place], [place] [address]               Blair Promise, M.D., Ph.D. Phone: [phone]      [place] A. Tammi Klippel, M.D. Fax: [phone]      Jodelle Gross, M.D., Ph.D.         Thea Silversmith, M.D.         Wyvonnia Lora, M.D Weekly Treatment Management Note  Name: Steve [name]     MRN: [id number]        CSN: [id number] Date: 02/03/2013      DOB: 1940/11/24  CC: Steve Serve, MD         Steve [name]    Status: Outpatient  Diagnosis: The encounter diagnosis was Lung cancer.  Current Dose: 37.8 gy  Current Fraction: 21  Planned Dose: 50.4 Gy  Narrative: Steve [name] was seen today for weekly treatment management. The chart was checked and CBCT  were reviewed. He continues to have significant esophageal symptoms. He has been inconsistent in using his Carafate and I discussed the importance of regular use of this medicine. He did sleep well last night after taking hydrocodone. He denies any breathing problems. He does have difficulty with swallowing solid foods and sometimes with liquids.  I have recommended he switch to Prilosec from Zantac  Review of patient's allergies indicates no known allergies. Current Outpatient Prescriptions  Medication Sig Dispense Refill  . diltiazem (CARDIZEM CD) 180 MG 24 hr capsule Take 1 capsule (180 mg total) by mouth daily.  90 capsule  3  . hyaluronate sodium (RADIAPLEXRX) GEL Apply 1 application topically 2 (two) times daily.      Marland Kitchen HYDROcodone-acetaminophen (NORCO/VICODIN) 5-325 MG per tablet       . ranitidine (ZANTAC) 150 MG tablet Take 150 mg by mouth at bedtime.      . simvastatin (ZOCOR) 20 MG tablet Take one tablet by mouth once daily to lower cholesterol  90 tablet  3  . sucralfate (CARAFATE) 1 GM/10ML suspension Take 10 mLs (1 g total) by mouth 4 (four) times daily -  with meals and at bedtime.  420 mL  0  . tamsulosin (FLOMAX) 0.4 MG CAPS  capsule Take 1 capsule (0.4 mg total) by mouth at bedtime.  30 capsule  0  . temazepam (RESTORIL) 15 MG capsule Take 1 capsule (15 mg total) by mouth at bedtime as needed for sleep.  30 capsule  0  . HYDROcodone-acetaminophen (HYCET) 7.5-325 mg/15 ml solution Take 10 mLs by mouth 4 (four) times daily as needed for moderate pain.  120 mL  0  . ibuprofen (ADVIL,MOTRIN) 200 MG tablet Take 200 mg by mouth every 6 (six) hours as needed.       No current facility-administered medications for this encounter.   Labs:  Lab Results  Component Value Date   WBC 3.5* 02/02/2013   HGB 12.9* 02/02/2013   HCT 39.1 02/02/2013   MCV 88.9 02/02/2013   PLT 208 02/02/2013   Lab Results  Component Value Date   CREATININE 0.8 02/02/2013   BUN 16.5 02/02/2013   NA 142 02/02/2013   K 3.9 02/02/2013   CL 103 11/17/2012   CO2 27 02/02/2013   Lab Results  Component Value Date   ALT 30 02/02/2013   AST 18 02/02/2013   BILITOT 0.51 02/02/2013    Physical Examination:  [employer]  Vitals:   02/03/13 1506  BP: 139/77  Pulse: 76  Temp: 97.2 F (36.2 C)    Wt Readings from Last 3 Encounters:  02/03/13 205 lb (92.987 kg)  02/02/13 203 lb 9.6 oz (92.352 kg)  01/29/13 201 lb (91.173 kg)    He has some moderate dermatitis in the radiation field.  no skin breakdown is appreciated Lungs - Normal respiratory effort, chest expands symmetrically. Lungs are clear to auscultation, no crackles or wheezes.  Heart has regular rhythm and rate  Abdomen is soft and non tender with normal bowel sounds  Assessment:  Patient tolerating treatments well  Plan: Continue treatment per original radiation prescription

## 2013-02-04 ENCOUNTER — Ambulatory Visit
Admission: RE | Admit: 2013-02-04 | Discharge: 2013-02-04 | Disposition: A | Discharge status: Home / Self Care | Payer: Medicare Other | Source: Ambulatory Visit | Attending: Radiation Oncology | Admitting: Radiation Oncology

## 2013-02-05 ENCOUNTER — Ambulatory Visit
Admission: RE | Admit: 2013-02-05 | Discharge: 2013-02-05 | Disposition: A | Discharge status: Home / Self Care | Payer: Medicare Other | Source: Ambulatory Visit | Attending: Radiation Oncology | Admitting: Radiation Oncology

## 2013-02-06 ENCOUNTER — Ambulatory Visit
Admission: RE | Admit: 2013-02-06 | Discharge: 2013-02-06 | Disposition: A | Discharge status: Home / Self Care | Payer: Medicare Other | Source: Ambulatory Visit | Attending: Radiation Oncology | Admitting: Radiation Oncology

## 2013-02-08 DIAGNOSIS — Z923 Personal history of irradiation: Secondary | ICD-10-CM

## 2013-02-08 HISTORY — DX: Personal history of irradiation: Z92.3

## 2013-02-09 ENCOUNTER — Ambulatory Visit (HOSPITAL_BASED_OUTPATIENT_CLINIC_OR_DEPARTMENT_OTHER): Payer: Medicare HMO

## 2013-02-09 ENCOUNTER — Other Ambulatory Visit (HOSPITAL_BASED_OUTPATIENT_CLINIC_OR_DEPARTMENT_OTHER): Payer: Medicare HMO

## 2013-02-09 ENCOUNTER — Ambulatory Visit
Admission: RE | Admit: 2013-02-09 | Discharge: 2013-02-09 | Disposition: A | Discharge status: Home / Self Care | Payer: Medicare Other | Source: Ambulatory Visit | Attending: Radiation Oncology | Admitting: Radiation Oncology

## 2013-02-09 VITALS — BP 114/71 | HR 98 | Temp 97.7°F | Resp 20

## 2013-02-09 DIAGNOSIS — C3430 Malignant neoplasm of lower lobe, unspecified bronchus or lung: Secondary | ICD-10-CM

## 2013-02-09 DIAGNOSIS — Z5111 Encounter for antineoplastic chemotherapy: Secondary | ICD-10-CM

## 2013-02-09 DIAGNOSIS — C3490 Malignant neoplasm of unspecified part of unspecified bronchus or lung: Secondary | ICD-10-CM

## 2013-02-09 LAB — COMPREHENSIVE METABOLIC PANEL (CC13)
ALT: 19 U/L (ref 0–55)
ANION GAP: 10 meq/L (ref 3–11)
AST: 15 U/L (ref 5–34)
Albumin: 3.2 g/dL — ABNORMAL LOW (ref 3.5–5.0)
Alkaline Phosphatase: 68 U/L (ref 40–150)
BUN: 16.9 mg/dL (ref 7.0–26.0)
CALCIUM: 9 mg/dL (ref 8.4–10.4)
CHLORIDE: 106 meq/L (ref 98–109)
CO2: 22 meq/L (ref 22–29)
CREATININE: 0.8 mg/dL (ref 0.7–1.3)
GLUCOSE: 209 mg/dL — AB (ref 70–140)
Potassium: 4.1 mEq/L (ref 3.5–5.1)
Sodium: 139 mEq/L (ref 136–145)
Total Bilirubin: 0.44 mg/dL (ref 0.20–1.20)
Total Protein: 6.4 g/dL (ref 6.4–8.3)

## 2013-02-09 LAB — CBC WITH DIFFERENTIAL/PLATELET
BASO%: 1.5 % (ref 0.0–2.0)
Basophils Absolute: 0 10*3/uL (ref 0.0–0.1)
EOS ABS: 0 10*3/uL (ref 0.0–0.5)
EOS%: 1.1 % (ref 0.0–7.0)
HCT: 37.5 % — ABNORMAL LOW (ref 38.4–49.9)
HEMOGLOBIN: 12.6 g/dL — AB (ref 13.0–17.1)
LYMPH%: 13.7 % — ABNORMAL LOW (ref 14.0–49.0)
MCH: 29.8 pg (ref 27.2–33.4)
MCHC: 33.6 g/dL (ref 32.0–36.0)
MCV: 88.7 fL (ref 79.3–98.0)
MONO#: 0.3 10*3/uL (ref 0.1–0.9)
MONO%: 9.3 % (ref 0.0–14.0)
NEUT%: 74.4 % (ref 39.0–75.0)
NEUTROS ABS: 2 10*3/uL (ref 1.5–6.5)
NRBC: 0 % (ref 0–0)
PLATELETS: 157 10*3/uL (ref 140–400)
RBC: 4.23 10*6/uL (ref 4.20–5.82)
RDW: 14.9 % — ABNORMAL HIGH (ref 11.0–14.6)
WBC: 2.7 10*3/uL — AB (ref 4.0–10.3)
lymph#: 0.4 10*3/uL — ABNORMAL LOW (ref 0.9–3.3)

## 2013-02-09 MED ORDER — DEXAMETHASONE SODIUM PHOSPHATE 20 MG/5ML IJ SOLN
20.0000 mg | Freq: Once | INTRAMUSCULAR | Status: AC
Start: 2013-02-09 — End: 2013-02-09
  Administered 2013-02-09: 20 mg via INTRAVENOUS

## 2013-02-09 MED ORDER — FAMOTIDINE IN NACL 20-0.9 MG/50ML-% IV SOLN
20.0000 mg | Freq: Once | INTRAVENOUS | Status: AC
Start: 2013-02-09 — End: 2013-02-09
  Administered 2013-02-09: 20 mg via INTRAVENOUS

## 2013-02-09 MED ORDER — ONDANSETRON 16 MG/50ML IVPB (CHCC)
INTRAVENOUS | Status: AC
  Filled 2013-02-09: qty 16

## 2013-02-09 MED ORDER — SODIUM CHLORIDE 0.9 % IV SOLN
45.0000 mg/m2 | Freq: Once | INTRAVENOUS | Status: AC
  Administered 2013-02-09: 96 mg via INTRAVENOUS
  Filled 2013-02-09: qty 16

## 2013-02-09 MED ORDER — SODIUM CHLORIDE 0.9 % IV SOLN
Freq: Once | INTRAVENOUS | Status: AC
Start: 2013-02-09 — End: 2013-02-09
  Administered 2013-02-09: 10:00:00 via INTRAVENOUS

## 2013-02-09 MED ORDER — ONDANSETRON 16 MG/50ML IVPB (CHCC)
16.0000 mg | Freq: Once | INTRAVENOUS | Status: AC
  Administered 2013-02-09: 16 mg via INTRAVENOUS

## 2013-02-09 MED ORDER — DEXAMETHASONE SODIUM PHOSPHATE 20 MG/5ML IJ SOLN
INTRAMUSCULAR | Status: AC
  Filled 2013-02-09: qty 5

## 2013-02-09 MED ORDER — FAMOTIDINE IN NACL 20-0.9 MG/50ML-% IV SOLN
INTRAVENOUS | Status: AC
  Filled 2013-02-09: qty 50

## 2013-02-09 MED ORDER — DIPHENHYDRAMINE HCL 50 MG/ML IJ SOLN
INTRAMUSCULAR | Status: AC
  Filled 2013-02-09: qty 1

## 2013-02-09 MED ORDER — DIPHENHYDRAMINE HCL 50 MG/ML IJ SOLN
50.0000 mg | Freq: Once | INTRAMUSCULAR | Status: AC
  Administered 2013-02-09: 50 mg via INTRAVENOUS

## 2013-02-09 MED ORDER — SODIUM CHLORIDE 0.9 % IV SOLN
230.0000 mg | Freq: Once | INTRAVENOUS | Status: AC
  Administered 2013-02-09: 230 mg via INTRAVENOUS
  Filled 2013-02-09: qty 23

## 2013-02-09 NOTE — Patient Instructions (Signed)
[facility] Discharge Instructions for Patients Receiving Chemotherapy  Today you received the following chemotherapy agents Taxol and Carboplatin.  To help prevent nausea and vomiting after your treatment, we encourage you to take your nausea medication.   If you develop nausea and vomiting that is not controlled by your nausea medication, call the clinic.   BELOW ARE SYMPTOMS THAT SHOULD BE REPORTED IMMEDIATELY:  *FEVER GREATER THAN 100.5 F  *CHILLS WITH OR WITHOUT FEVER  NAUSEA AND VOMITING THAT IS NOT CONTROLLED WITH YOUR NAUSEA MEDICATION  *UNUSUAL SHORTNESS OF BREATH  *UNUSUAL BRUISING OR BLEEDING  TENDERNESS IN MOUTH AND THROAT WITH OR WITHOUT PRESENCE OF ULCERS  *URINARY PROBLEMS  *BOWEL PROBLEMS  UNUSUAL RASH Items with * indicate a potential emergency and should be followed up as soon as possible.  Feel free to call the clinic you have any questions or concerns. The clinic phone number is [phone].    

## 2013-02-10 ENCOUNTER — Ambulatory Visit
Admission: RE | Admit: 2013-02-10 | Discharge: 2013-02-10 | Disposition: A | Discharge status: Home / Self Care | Payer: Medicare Other | Source: Ambulatory Visit | Attending: Radiation Oncology | Admitting: Radiation Oncology

## 2013-02-10 VITALS — BP 111/68 | HR 78 | Temp 97.4°F | Ht 73.0 in | Wt 205.7 lb

## 2013-02-10 DIAGNOSIS — C3490 Malignant neoplasm of unspecified part of unspecified bronchus or lung: Secondary | ICD-10-CM

## 2013-02-10 MED ORDER — RADIAPLEXRX EX GEL
Freq: Once | CUTANEOUS | Status: AC
  Administered 2013-02-10: 16:00:00 via TOPICAL

## 2013-02-10 NOTE — Progress Notes (Signed)
Steve [name] has had 26 fractions to his right chest.  His heart rate is irregular today ranging from 50-108.  He denies pain.  He has been having burning in his chest and feeling like food gets stuck especially after eating a heavy meal like hamburger.  He an occasional cough.  He denies hemotypsis.  He has been having nose bleeds in the mornings.  He has noticed shortness of breath with activity.  His oxygen saturation was 99% today.  He reports hoarseness and fatigue.  He had his last chemotherapy treatment yesterday.  He has redness on his chest.  He is using radiaplex and has requested a refill.  Another tube has been given.  Follow up appointment card has also been given.

## 2013-02-10 NOTE — Progress Notes (Signed)
  Radiation Oncology         [phone] ________________________________  Name: Steve [name] MRN: [id number]  Date: 02/10/2013  DOB: July 18, 1940  Weekly Radiation Therapy Management Lung cancer   Primary site: Lung (Right)   Staging method: AJCC 7th Edition   Clinical: Stage IIIA (T2b, N2, M0)   Summary: Stage IIIA (T2b, N2, M0)  Current Dose: 46.8 Gy     Planned Dose:  50.4 Gy  Narrative . . . . . . . . The patient presents for routine under treatment assessment.                                   The patient is without complaint except for continued esophageal symptoms. He can take in soft foods at this time.  He continues on Prilosec and Carafate.                                 Set-up films were reviewed.                                 The chart was checked. Physical Findings. . .  height is 6\' 1"  (1.854 m) and weight is 205 lb 11.2 oz (93.305 kg). His temperature is 97.4 F (36.3 C). His blood pressure is 111/68 and his pulse is 78. His oxygen saturation is 99%. . Weight essentially stable.  No significant changes. He continues to be in A. fib Impression . . . . . . . The patient is tolerating radiation. Plan . . . . . . . . . . . . Continue treatment as planned.  ________________________________  -----------------------------------  Blair Promise, PhD, MD

## 2013-02-10 NOTE — Addendum Note (Signed)
Encounter addended by: Jacqulyn Liner, RN on: 02/10/2013  3:58 PM<BR>     Documentation filed: Inpatient MAR

## 2013-02-11 ENCOUNTER — Ambulatory Visit
Admission: RE | Admit: 2013-02-11 | Discharge: 2013-02-11 | Disposition: A | Discharge status: Home / Self Care | Payer: Medicare Other | Source: Ambulatory Visit | Attending: Radiation Oncology | Admitting: Radiation Oncology

## 2013-02-11 ENCOUNTER — Telehealth: Payer: Self-pay | Admitting: *Deleted

## 2013-02-11 NOTE — Telephone Encounter (Signed)
Spoke with pt wife,monitor reviewed by dr Harrington Challenger shows sinus bradycardia to sinus tachycardia. With episodes of sustained and non-sustained atrial fib Average HR 90 bpm  Re: keep on the same regimen Until his esophagus heals not a candidate for anything else. He should be taking an enteric coated aspirin 325 mg daily Should have f/u in clinic.  Pt to call back to schedule

## 2013-02-12 ENCOUNTER — Ambulatory Visit
Admission: RE | Admit: 2013-02-12 | Discharge: 2013-02-12 | Disposition: A | Discharge status: Home / Self Care | Payer: Medicare Other | Source: Ambulatory Visit | Attending: Radiation Oncology | Admitting: Radiation Oncology

## 2013-02-13 ENCOUNTER — Ambulatory Visit: Payer: Medicare Other

## 2013-02-16 ENCOUNTER — Telehealth: Payer: Self-pay | Admitting: *Deleted

## 2013-02-16 ENCOUNTER — Other Ambulatory Visit: Payer: Medicare HMO

## 2013-02-16 ENCOUNTER — Other Ambulatory Visit: Payer: Self-pay | Admitting: *Deleted

## 2013-02-16 ENCOUNTER — Other Ambulatory Visit: Payer: Self-pay | Admitting: Radiation Oncology

## 2013-02-16 ENCOUNTER — Ambulatory Visit: Payer: Medicare Other

## 2013-02-16 MED ORDER — OXYCODONE-ACETAMINOPHEN 5-325 MG PO TABS: 1.0000 | ORAL_TABLET | ORAL | Status: DC | PRN

## 2013-02-16 NOTE — Telephone Encounter (Signed)
Steve [name] called with concerns regarding an increase in his esophageal pain since completing his radiation therapy. He describes constant, burning pain pain in his chest which is aggravated when he swallows.  He stated he has been out of Carafate slurry for a "couple of days" and needs relief.  Called in refill on Carafate 420 ml /10 ml with meals and at bedtime and called in a new script for Magic Mouthwash 10 ml QID alternating with the Carafate.  He was also given a prescription for Percocet 5-325 MG: 1 po Q 4hrs prn severe pain.  His spouse came to pick up his prescription and revealed that Steve [name] had chemotherapy on 02/09/13.  Instructed her to examine his mouth when she returns home and to call if she notes any Sottile areas in the back of his throat, inside his cheeks,along the gum line or on  the ventral/posterior surface of the tongue.  She was given the number to call once she returns home and Elmo Putt, RN is aware and will receive the call.  Dr. Sondra Come informed.

## 2013-02-17 ENCOUNTER — Ambulatory Visit (HOSPITAL_COMMUNITY): Payer: Medicare HMO | Attending: Internal Medicine | Admitting: Radiology

## 2013-02-17 ENCOUNTER — Ambulatory Visit: Payer: Medicare Other

## 2013-02-17 DIAGNOSIS — I080 Rheumatic disorders of both mitral and aortic valves: Secondary | ICD-10-CM | POA: Insufficient documentation

## 2013-02-17 DIAGNOSIS — E785 Hyperlipidemia, unspecified: Secondary | ICD-10-CM | POA: Insufficient documentation

## 2013-02-17 DIAGNOSIS — I4891 Unspecified atrial fibrillation: Secondary | ICD-10-CM | POA: Insufficient documentation

## 2013-02-17 DIAGNOSIS — E119 Type 2 diabetes mellitus without complications: Secondary | ICD-10-CM | POA: Insufficient documentation

## 2013-02-17 DIAGNOSIS — Z85118 Personal history of other malignant neoplasm of bronchus and lung: Secondary | ICD-10-CM | POA: Insufficient documentation

## 2013-02-17 DIAGNOSIS — Z87891 Personal history of nicotine dependence: Secondary | ICD-10-CM | POA: Insufficient documentation

## 2013-02-17 NOTE — Progress Notes (Signed)
Echocardiogram performed.  

## 2013-02-18 ENCOUNTER — Ambulatory Visit
Admission: RE | Admit: 2013-02-18 | Discharge: 2013-02-18 | Disposition: A | Discharge status: Home / Self Care | Payer: Medicare HMO | Source: Ambulatory Visit | Attending: Radiation Oncology | Admitting: Radiation Oncology

## 2013-02-18 ENCOUNTER — Encounter (INDEPENDENT_AMBULATORY_CARE_PROVIDER_SITE_OTHER): Payer: Self-pay

## 2013-02-18 ENCOUNTER — Encounter: Payer: Self-pay | Admitting: Radiation Oncology

## 2013-02-18 ENCOUNTER — Ambulatory Visit
Admission: RE | Admit: 2013-02-18 | Discharge: 2013-02-18 | Disposition: A | Discharge status: Home / Self Care | Payer: Medicare Other | Source: Ambulatory Visit | Attending: Radiation Oncology | Admitting: Radiation Oncology

## 2013-02-18 ENCOUNTER — Ambulatory Visit: Payer: Medicare Other

## 2013-02-18 ENCOUNTER — Telehealth: Payer: Self-pay | Admitting: Oncology

## 2013-02-18 ENCOUNTER — Other Ambulatory Visit: Payer: Self-pay | Admitting: Radiation Oncology

## 2013-02-18 ENCOUNTER — Ambulatory Visit (HOSPITAL_BASED_OUTPATIENT_CLINIC_OR_DEPARTMENT_OTHER): Payer: Medicare HMO

## 2013-02-18 VITALS — BP 122/74 | HR 80 | Temp 96.9°F | Resp 18

## 2013-02-18 VITALS — BP 115/60 | HR 90 | Temp 98.6°F | Ht 73.0 in | Wt 198.5 lb

## 2013-02-18 DIAGNOSIS — C3490 Malignant neoplasm of unspecified part of unspecified bronchus or lung: Secondary | ICD-10-CM

## 2013-02-18 DIAGNOSIS — C3430 Malignant neoplasm of lower lobe, unspecified bronchus or lung: Secondary | ICD-10-CM

## 2013-02-18 LAB — CBC WITH DIFFERENTIAL/PLATELET
BASO%: 0.4 % (ref 0.0–2.0)
Basophils Absolute: 0 10*3/uL (ref 0.0–0.1)
EOS%: 0.2 % (ref 0.0–7.0)
Eosinophils Absolute: 0 10*3/uL (ref 0.0–0.5)
HEMATOCRIT: 34.3 % — AB (ref 38.4–49.9)
HEMOGLOBIN: 11.5 g/dL — AB (ref 13.0–17.1)
LYMPH%: 9.4 % — AB (ref 14.0–49.0)
MCH: 29.9 pg (ref 27.2–33.4)
MCHC: 33.5 g/dL (ref 32.0–36.0)
MCV: 89 fL (ref 79.3–98.0)
MONO#: 0.4 10*3/uL (ref 0.1–0.9)
MONO%: 15.2 % — ABNORMAL HIGH (ref 0.0–14.0)
NEUT#: 2.1 10*3/uL (ref 1.5–6.5)
NEUT%: 74.8 % (ref 39.0–75.0)
PLATELETS: 189 10*3/uL (ref 140–400)
RBC: 3.85 10*6/uL — ABNORMAL LOW (ref 4.20–5.82)
RDW: 15.2 % — ABNORMAL HIGH (ref 11.0–14.6)
WBC: 2.9 10*3/uL — AB (ref 4.0–10.3)
lymph#: 0.3 10*3/uL — ABNORMAL LOW (ref 0.9–3.3)

## 2013-02-18 LAB — BASIC METABOLIC PANEL (CC13)
Anion Gap: 12 mEq/L — ABNORMAL HIGH (ref 3–11)
BUN: 18.3 mg/dL (ref 7.0–26.0)
CALCIUM: 9.6 mg/dL (ref 8.4–10.4)
CO2: 23 meq/L (ref 22–29)
Chloride: 102 mEq/L (ref 98–109)
Creatinine: 0.9 mg/dL (ref 0.7–1.3)
GLUCOSE: 165 mg/dL — AB (ref 70–140)
Potassium: 3.9 mEq/L (ref 3.5–5.1)
Sodium: 137 mEq/L (ref 136–145)

## 2013-02-18 MED ORDER — SODIUM CHLORIDE 0.9 % IV SOLN
Freq: Once | INTRAVENOUS | Status: AC
Start: 2013-02-18 — End: 2013-02-18
  Administered 2013-02-18: 12:00:00 via INTRAVENOUS

## 2013-02-18 MED ORDER — KCL IN DEXTROSE-NACL 20-5-0.45 MEQ/L-%-% IV SOLN
Freq: Once | INTRAVENOUS | Status: AC
  Administered 2013-02-18: 13:00:00 via INTRAVENOUS
  Filled 2013-02-18: qty 1000

## 2013-02-18 NOTE — Progress Notes (Signed)
Steve [name] here for follow up after treatment to his chest.  He is having severe pain/burning in his chest with swallowing.  He is having a hard time eating and drinking.  He ate a yogurt and a boost yesterday.  He tried chicken soup and could not get it down.  He restarted carafate yesterday.  He is also taking 1 tablet of percocet q 4 hours at night and hydrocodone tablets during the day alternating with the Hycet.  He tried the magic mouthwash this morning and it really burned.  He has lost 7 lbs since 02/10/13.  His oral mucosa is intact.  Orthostatic vitals done: bp sitting 97/80, hr 77, bp standing 115/60 hr 90.  He denies having a cough.  His voice is hoarse.

## 2013-02-18 NOTE — Telephone Encounter (Signed)
Steve [name] called and said he can't swallow.  He is unable to eat or drink and would like to see Dr. Sondra Come today.  He is taking 1 percocet q 4 hours.  He has not been taking the magic mouthwash.  Advised him to start using it today.  Will forward to Dr. Sondra Come and call [name] back.

## 2013-02-18 NOTE — Progress Notes (Signed)
Radiation Oncology         [phone] ________________________________  Name: Steve [name] MRN: [id number]  Date: 02/18/2013  DOB: 10-12-1940  Follow-Up Visit Note  CC: [name], MD  [name], MD  Diagnosis:   Lung cancer   Primary site: Lung (Right)   Staging method: AJCC 7th Edition   Clinical: Stage IIIA (T2b, N2, M0)   Summary: Stage IIIA (T2b, N2, M0)   Interval Since Last Radiation:  5  days  Narrative:  The patient returns today earlier than his scheduled 1 month followup. The patient is been having significant esophageal symptoms limiting his by mouth intake.  He can barely take in liquids at this time. He has minimal soft food intake.  Magic mouthwash did not help his symptoms significantly.  He denies any chills or fever or significant cough.                          ALLERGIES:  has No Known Allergies.  Meds: Current Outpatient Prescriptions  Medication Sig Dispense Refill  . Alum & Mag Hydroxide-Simeth (MAGIC MOUTHWASH) SOLN Take 10 mLs by mouth 4 (four) times daily. Alternate with the Carafate Suspension. 1 refill      . aspirin 325 MG tablet Take 325 mg by mouth daily.      Marland Kitchen diltiazem (CARDIZEM CD) 180 MG 24 hr capsule Take 1 capsule (180 mg total) by mouth daily.  90 capsule  3  . hyaluronate sodium (RADIAPLEXRX) GEL Apply 1 application topically 2 (two) times daily.      Marland Kitchen HYDROcodone-acetaminophen (HYCET) 7.5-325 mg/15 ml solution Take 10 mLs by mouth 4 (four) times daily as needed for moderate pain.  120 mL  0  . omeprazole (PRILOSEC) 20 MG capsule Take 20 mg by mouth daily.      Marland Kitchen oxyCODONE-acetaminophen (PERCOCET/ROXICET) 5-325 MG per tablet Take 1 tablet by mouth every 4 (four) hours as needed for severe pain.  30 tablet  0  . simvastatin (ZOCOR) 20 MG tablet Take one tablet by mouth once daily to lower cholesterol  90 tablet  3  . sucralfate (CARAFATE) 1 GM/10ML suspension Take 1 g by mouth 4 (four) times daily -  with meals and at bedtime.  Refill from original script written on 01/26/13.  1 refill on 02/16/13 script. Dr. Blair Promise      . tamsulosin (FLOMAX) 0.4 MG CAPS capsule Take 1 capsule (0.4 mg total) by mouth at bedtime.  30 capsule  0  . temazepam (RESTORIL) 15 MG capsule Take 1 capsule (15 mg total) by mouth at bedtime as needed for sleep.  30 capsule  0  . HYDROcodone-acetaminophen (NORCO/VICODIN) 5-325 MG per tablet       . ibuprofen (ADVIL,MOTRIN) 200 MG tablet Take 200 mg by mouth every 6 (six) hours as needed.      . ranitidine (ZANTAC) 150 MG tablet Take 150 mg by mouth at bedtime.       No current facility-administered medications for this encounter.    Physical Findings: The patient is in no acute distress. Patient is alert and oriented.  height is 6\' 1"  (1.854 m) and weight is 198 lb 8 oz (90.039 kg). His temperature is 98.6 F (37 C). His blood pressure is 115/60 and his pulse is 90. His oxygen saturation is 96%. .  The lungs are clear. The heart has regular rhythm and rate at this time. The oral cavity is free of  secondary infection.  Orthostatic blood pressure changes were noted today  Lab Findings: Lab Results  Component Value Date   WBC 2.9* 02/18/2013   HGB 11.5* 02/18/2013   HCT 34.3* 02/18/2013   MCV 89.0 02/18/2013   PLT 189 02/18/2013      Radiographic Findings: No results found.  Impression:  The patient is having significant esophagitis..    Plan:  The patient will present to the lab for routine blood work including a CBC and bmet.  He  will also undergo IV fluid supplementation.  I considered a Duragesic patch but this has a significant interaction with the patient's diltiazem and this was not prescribed a day. He will continue with his current narcotic regimen.  Patient will call tomorrow to let us know his status and to consider additional IV fluid supplementation later this week. He will keep is already scheduled followup appointment in approximately 3  weeks.  ____________________________________ Blair Promise, MD

## 2013-02-18 NOTE — Patient Instructions (Signed)
Dehydration, Adult Dehydration is when you lose more fluids from the body than you take in. Vital organs like the kidneys, brain, and heart cannot function without a proper amount of fluids and salt. Any loss of fluids from the body can cause dehydration.  CAUSES   Vomiting.  Diarrhea.  Excessive sweating.  Excessive urine output.  Fever. SYMPTOMS  Mild dehydration  Thirst.  Dry lips.  Slightly dry mouth. Moderate dehydration  Very dry mouth.  Sunken eyes.  Skin does not bounce back quickly when lightly pinched and released.  Dark urine and decreased urine production.  Decreased tear production.  Headache. Severe dehydration  Very dry mouth.  Extreme thirst.  Rapid, weak pulse (more than 100 beats per minute at rest).  Cold hands and feet.  Not able to sweat in spite of heat and temperature.  Rapid breathing.  Blue lips.  Confusion and lethargy.  Difficulty being awakened.  Minimal urine production.  No tears. DIAGNOSIS  Your caregiver will diagnose dehydration based on your symptoms and your exam. Blood and urine tests will help confirm the diagnosis. The diagnostic evaluation should also identify the cause of dehydration. TREATMENT  Treatment of mild or moderate dehydration can often be done at home by increasing the amount of fluids that you drink. It is best to drink small amounts of fluid more often. Drinking too much at one time can make vomiting worse. Refer to the home care instructions below. Severe dehydration needs to be treated at the hospital where you will probably be given intravenous (IV) fluids that contain water and electrolytes. HOME CARE INSTRUCTIONS   Ask your caregiver about specific rehydration instructions.  Drink enough fluids to keep your urine clear or pale yellow.  Drink small amounts frequently if you have nausea and vomiting.  Eat as you normally do.  Avoid:  Foods or drinks high in sugar.  Carbonated  drinks.  Juice.  Extremely hot or cold fluids.  Drinks with caffeine.  Fatty, greasy foods.  Alcohol.  Tobacco.  Overeating.  Gelatin desserts.  Wash your hands well to avoid spreading bacteria and viruses.  Only take over-the-counter or prescription medicines for pain, discomfort, or fever as directed by your caregiver.  Ask your caregiver if you should continue all prescribed and over-the-counter medicines.  Keep all follow-up appointments with your caregiver. SEEK MEDICAL CARE IF:  You have abdominal pain and it increases or stays in one area (localizes).  You have a rash, stiff neck, or severe headache.  You are irritable, sleepy, or difficult to awaken.  You are weak, dizzy, or extremely thirsty. SEEK IMMEDIATE MEDICAL CARE IF:   You are unable to keep fluids down or you get worse despite treatment.  You have frequent episodes of vomiting or diarrhea.  You have blood or green matter (bile) in your vomit.  You have blood in your stool or your stool looks black and tarry.  You have not urinated in 6 to 8 hours, or you have only urinated a small amount of very dark urine.  You have a fever.  You faint. MAKE SURE YOU:   Understand these instructions.  Will watch your condition.  Will get help right away if you are not doing well or get worse. Document Released: 12/25/2004 Document Revised: 03/19/2011 Document Reviewed: 08/14/2010 Exit[employer] Patient Information 2014 [employer].  

## 2013-02-18 NOTE — Progress Notes (Signed)
1610 Pt complained of cramping in both hands. Pt asked for infusion to be stopped. Infusion stopped an IV d'cd. Aprox 100 ml left in bag. Pt appeared to be upset concerning his hands cramping. Pt stated " I think they put the wrong medication in the bag".   RN tried to reassure pt that the fluids that Dr. Sondra Come had ordered had been infusing.  Pt refused offer for MD to be called and refused to stay.   Pharmacist, Montel Clock attempted to explain to patient why his hands may be cramping. Pt walked away. Pt was without any other complaints.

## 2013-02-19 ENCOUNTER — Ambulatory Visit: Payer: Medicare Other

## 2013-02-20 ENCOUNTER — Ambulatory Visit: Payer: Medicare Other

## 2013-02-20 ENCOUNTER — Telehealth: Payer: Self-pay | Admitting: *Deleted

## 2013-02-20 NOTE — Telephone Encounter (Signed)
Pt p/c from pt - needs to verify Dr Harrington Challenger wants him to continue on [place].  Advised to continue and I will double check with her.  Advised there is no indication he was to d/c it but he wants to make sure since his 2 D Echo was normal.

## 2013-02-23 ENCOUNTER — Encounter: Payer: Self-pay | Admitting: Radiation Oncology

## 2013-02-23 ENCOUNTER — Telehealth: Payer: Self-pay | Admitting: *Deleted

## 2013-02-23 ENCOUNTER — Encounter: Payer: Self-pay | Admitting: *Deleted

## 2013-02-23 ENCOUNTER — Ambulatory Visit: Payer: Medicare Other

## 2013-02-23 ENCOUNTER — Non-Acute Institutional Stay (HOSPITAL_COMMUNITY)
Admission: AD | Admit: 2013-02-23 | Discharge: 2013-02-23 | Disposition: A | Discharge status: Home / Self Care | Payer: Medicare HMO | Source: Ambulatory Visit | Attending: Radiation Oncology | Admitting: Radiation Oncology

## 2013-02-23 ENCOUNTER — Ambulatory Visit
Admission: RE | Admit: 2013-02-23 | Discharge: 2013-02-23 | Disposition: A | Discharge status: Home / Self Care | Payer: Medicare HMO | Source: Ambulatory Visit | Attending: Radiation Oncology | Admitting: Radiation Oncology

## 2013-02-23 ENCOUNTER — Other Ambulatory Visit: Payer: Self-pay

## 2013-02-23 ENCOUNTER — Other Ambulatory Visit: Payer: Self-pay | Admitting: Oncology

## 2013-02-23 ENCOUNTER — Ambulatory Visit: Payer: Self-pay | Admitting: Radiation Oncology

## 2013-02-23 DIAGNOSIS — E860 Dehydration: Secondary | ICD-10-CM | POA: Insufficient documentation

## 2013-02-23 DIAGNOSIS — K209 Esophagitis, unspecified without bleeding: Secondary | ICD-10-CM | POA: Insufficient documentation

## 2013-02-23 DIAGNOSIS — R799 Abnormal finding of blood chemistry, unspecified: Secondary | ICD-10-CM | POA: Insufficient documentation

## 2013-02-23 DIAGNOSIS — C3490 Malignant neoplasm of unspecified part of unspecified bronchus or lung: Secondary | ICD-10-CM | POA: Insufficient documentation

## 2013-02-23 DIAGNOSIS — Z79899 Other long term (current) drug therapy: Secondary | ICD-10-CM | POA: Insufficient documentation

## 2013-02-23 DIAGNOSIS — R52 Pain, unspecified: Secondary | ICD-10-CM | POA: Insufficient documentation

## 2013-02-23 LAB — BASIC METABOLIC PANEL (CC13)
ANION GAP: 13 meq/L — AB (ref 3–11)
BUN: 19.6 mg/dL (ref 7.0–26.0)
CALCIUM: 10.7 mg/dL — AB (ref 8.4–10.4)
CO2: 27 mEq/L (ref 22–29)
Chloride: 103 mEq/L (ref 98–109)
Creatinine: 1 mg/dL (ref 0.7–1.3)
Glucose: 113 mg/dl (ref 70–140)
Potassium: 3.7 mEq/L (ref 3.5–5.1)
SODIUM: 143 meq/L (ref 136–145)

## 2013-02-23 LAB — CBC WITH DIFFERENTIAL/PLATELET
BASO%: 0.6 % (ref 0.0–2.0)
Basophils Absolute: 0 10*3/uL (ref 0.0–0.1)
EOS ABS: 0 10*3/uL (ref 0.0–0.5)
EOS%: 0.5 % (ref 0.0–7.0)
HCT: 38.2 % — ABNORMAL LOW (ref 38.4–49.9)
HGB: 12.8 g/dL — ABNORMAL LOW (ref 13.0–17.1)
LYMPH%: 11.7 % — AB (ref 14.0–49.0)
MCH: 30.1 pg (ref 27.2–33.4)
MCHC: 33.7 g/dL (ref 32.0–36.0)
MCV: 89.4 fL (ref 79.3–98.0)
MONO#: 0.6 10*3/uL (ref 0.1–0.9)
MONO%: 14.8 % — AB (ref 0.0–14.0)
NEUT#: 2.9 10*3/uL (ref 1.5–6.5)
NEUT%: 72.4 % (ref 39.0–75.0)
PLATELETS: 297 10*3/uL (ref 140–400)
RBC: 4.27 10*6/uL (ref 4.20–5.82)
RDW: 17.1 % — ABNORMAL HIGH (ref 11.0–14.6)
WBC: 4.1 10*3/uL (ref 4.0–10.3)
lymph#: 0.5 10*3/uL — ABNORMAL LOW (ref 0.9–3.3)

## 2013-02-23 MED ORDER — SODIUM CHLORIDE 0.9 % IV SOLN
INTRAVENOUS | Status: DC
  Administered 2013-02-23: 12:00:00 via INTRAVENOUS

## 2013-02-23 MED ORDER — SODIUM CHLORIDE 0.9 % IV SOLN
Freq: Once | INTRAVENOUS | Status: DC
  Filled 2013-02-23: qty 1000

## 2013-02-23 MED ORDER — HYDROMORPHONE HCL PF 4 MG/ML IJ SOLN
0.5000 mg | Freq: Once | INTRAMUSCULAR | Status: AC
  Administered 2013-02-23: 0.5 mg via INTRAVENOUS
  Filled 2013-02-23: qty 1

## 2013-02-23 MED ORDER — FLUCONAZOLE 100 MG PO TABS: 100.0000 mg | ORAL_TABLET | Freq: Every day | ORAL | Status: DC

## 2013-02-23 MED ORDER — HYDROMORPHONE HCL 4 MG PO TABS: 4.0000 mg | ORAL_TABLET | ORAL | Status: DC | PRN

## 2013-02-23 NOTE — Telephone Encounter (Signed)
Per Santiago Glad radiation I have scheduled appts for this week.

## 2013-02-23 NOTE — Progress Notes (Signed)
Radiation Oncology         [phone] ________________________________  Name: Steve [name] MRN: [id number]  Date: 02/23/2013  DOB: January 26, 1940  Follow-Up Visit Note  CC: [name], MD  [name], MD  Diagnosis:   Lung cancer  Primary site: Lung (Right)  Staging method: AJCC 7th Edition  Clinical: Stage IIIA (T2b, N2, M0)  Summary: Stage IIIA (T2b, N2, M0)   Interval Since Last Radiation:  10  days  Narrative:  The patient returns today for for close followup and further evaluation. He did receive IV fluids on February 11. I asked that he consider IV fluid supplementation again later last week but he never call back to schedule. This IV fluid supplementation on Wednesday of last week did help some with his symptomatology but he has continued to have significant difficulty in taking in liquids. He is unable to take any significant solid foods except butterscotch  Pudding.  He is able to swallow pills at this time although with difficulty.                                ALLERGIES:  has No Known Allergies.  Meds: Current Outpatient Prescriptions  Medication Sig Dispense Refill  . diltiazem (CARDIZEM CD) 180 MG 24 hr capsule Take 1 capsule (180 mg total) by mouth daily.  90 capsule  3  . oxyCODONE-acetaminophen (PERCOCET/ROXICET) 5-325 MG per tablet Take 1 tablet by mouth every 4 (four) hours as needed for severe pain.  30 tablet  0  . sucralfate (CARAFATE) 1 GM/10ML suspension Take 1 g by mouth 4 (four) times daily -  with meals and at bedtime. Refill from original script written on 01/26/13.  1 refill on 02/16/13 script. Dr. Blair Promise      . Alum & Mag Hydroxide-Simeth (MAGIC MOUTHWASH) SOLN Take 10 mLs by mouth 4 (four) times daily. Alternate with the Carafate Suspension. 1 refill      . aspirin 325 MG tablet Take 325 mg by mouth daily.      . fluconazole (DIFLUCAN) 100 MG tablet Take 1 tablet (100 mg total) by mouth daily.  8 tablet  0  . hyaluronate sodium  (RADIAPLEXRX) GEL Apply 1 application topically 2 (two) times daily.      Marland Kitchen HYDROcodone-acetaminophen (HYCET) 7.5-325 mg/15 ml solution Take 10 mLs by mouth 4 (four) times daily as needed for moderate pain.  120 mL  0  . HYDROcodone-acetaminophen (NORCO/VICODIN) 5-325 MG per tablet       . HYDROmorphone (DILAUDID) 4 MG tablet Take 1 tablet (4 mg total) by mouth every 4 (four) hours as needed for severe pain.  30 tablet  0  . ibuprofen (ADVIL,MOTRIN) 200 MG tablet Take 200 mg by mouth every 6 (six) hours as needed.      Marland Kitchen omeprazole (PRILOSEC) 20 MG capsule Take 20 mg by mouth daily.      . ranitidine (ZANTAC) 150 MG tablet Take 150 mg by mouth at bedtime.      . simvastatin (ZOCOR) 20 MG tablet Take one tablet by mouth once daily to lower cholesterol  90 tablet  3  . tamsulosin (FLOMAX) 0.4 MG CAPS capsule Take 1 capsule (0.4 mg total) by mouth at bedtime.  30 capsule  0  . temazepam (RESTORIL) 15 MG capsule Take 1 capsule (15 mg total) by mouth at bedtime as needed for sleep.  30 capsule  0  No current facility-administered medications for this encounter.   Facility-Administered Medications Ordered in Other Encounters  Medication Dose Route Frequency Provider Last Rate Last Dose  . 0.9 %  sodium chloride infusion   Intravenous Continuous Blair Promise, MD 333 mL/hr at 02/23/13 1140      Physical Findings: The patient is in no acute distress. Patient is alert and oriented.  Orthostatic blood pressure changes are noted. He has lost 7 pounds since evaluation last week.   oxygen saturation is 98% on room air my temperature 97.6.  The lungs are clear. The heart has a regular rhythm and rate, with no signs of atrial fibrillation at this time The abdomen is soft and nontender with normal bowel sounds. The oral cavity shows no secondary infection.   Lab Findings: Lab Results  Component Value Date   WBC 4.1 02/23/2013   HGB 12.8* 02/23/2013   HCT 38.2* 02/23/2013   MCV 89.4 02/23/2013   PLT 297  02/23/2013      Radiographic Findings: No results found.  Impression:  The patient continues to suffer from esophagitis. Patient is dehydrated and will be set up for IV fluids today as well as the rest of this week..  blood work today shows slight elevation of his calcium and will be followed.    Plan:  He will undergo IV fluid supplementation  in the sickle cell clinic.  He will be placed on Diflucan for presumed candida esophagitis. He also be placed on stronger pain medication (Dilaudid). Patient will return this week for IV fluid supplementation daily.  I discussed the possible NG tube placement and GI evaluation if he does not improve in the near future.  ____________________________________ Blair Promise, MD

## 2013-02-23 NOTE — Progress Notes (Unsigned)
Patient at nursing asking to see Santiago Glad Hess,RN "I need to see Dr,Kinard now"infomed him MD was in the OR and that his nurse Santiago Glad comes in at 1000 am this am, "I'm dying,can't swallow any food or liquids,was told I'd get a pain patch and then was told no not getting one,, I may need blood work again to see if I need IVF"s""I can't even swallow a drop of water,iI'm forcing down my pain pill, I need to see the MD ,I finished my treatment last week",  I did acknowledge patient's concerns, however did inform him that it would take several weeks for throat  To get better,but would have him see Dr.Kinard today as a follow up around [phone] am today if he wanted to wait,"I'll wait upstairs I may have to get a feeding tube or more IVF"S I can't just stay home and die" patient raising his voice at first but I took his weight and vitals and told him we would page him down as soon as Dr.Kinard came back from the [place],, patient calmed down , and went upstairs to wait 8:52 AM

## 2013-02-23 NOTE — Procedures (Signed)
[facility]  Procedure Note  Steve [name] LXB:[id number] DOB: 01/24/1940 DOA: 02/23/2013   Ordered by Dr. Sondra Come  Associated Diagnosis: Lung Cancer  Procedure Note: IV started and patient received Normal Saline fluids via IV   Condition During Procedure:Patient tolerated fluids well.  Did complain of epigastric pain 5/10.  IV push Dilaudid given x1 per order   Condition at Discharge: patient stable at discharge, still with epigastric pain, Dr. Sondra Come came to [facility] to speak with patient and provide prescriptions and follow up appointments.   Roberto Scales, RN  [facility]

## 2013-02-23 NOTE — Telephone Encounter (Signed)
Left message of dr Harrington Challenger recommendations.

## 2013-02-23 NOTE — Progress Notes (Signed)
Received call from radiation oncology nursing follow up concerning patient's pain post medication. Patient states no improvement. Mateo Flow RN to notify Dr. Sondra Come. Dr. Sondra Come to see patient today at sickle cell center or have patient go to Dr. Clabe Seal office upon completion of infusion.

## 2013-02-23 NOTE — Telephone Encounter (Signed)
He should keep on cartia

## 2013-02-23 NOTE — Progress Notes (Signed)
Patient c/o pain and reports he was told in radiation oncology he was going to get pain medication via IV. Steve [name] at [facility] notified. Steve [name] to notify MD and return call back. MD placed order. Received call back from Steve [name] verifying pain medication order entered. To continue to monitor.

## 2013-02-24 ENCOUNTER — Other Ambulatory Visit: Payer: Self-pay | Admitting: Oncology

## 2013-02-24 ENCOUNTER — Other Ambulatory Visit: Payer: Self-pay | Admitting: Radiation Oncology

## 2013-02-24 ENCOUNTER — Other Ambulatory Visit: Payer: Self-pay | Admitting: *Deleted

## 2013-02-24 ENCOUNTER — Encounter: Payer: Self-pay | Admitting: Radiation Oncology

## 2013-02-24 ENCOUNTER — Ambulatory Visit (HOSPITAL_BASED_OUTPATIENT_CLINIC_OR_DEPARTMENT_OTHER): Payer: Medicare HMO

## 2013-02-24 ENCOUNTER — Telehealth: Payer: Self-pay | Admitting: Oncology

## 2013-02-24 VITALS — BP 123/72 | HR 76 | Temp 97.1°F

## 2013-02-24 DIAGNOSIS — C3490 Malignant neoplasm of unspecified part of unspecified bronchus or lung: Secondary | ICD-10-CM

## 2013-02-24 DIAGNOSIS — C3430 Malignant neoplasm of lower lobe, unspecified bronchus or lung: Secondary | ICD-10-CM

## 2013-02-24 DIAGNOSIS — C679 Malignant neoplasm of bladder, unspecified: Secondary | ICD-10-CM

## 2013-02-24 DIAGNOSIS — E860 Dehydration: Secondary | ICD-10-CM

## 2013-02-24 MED ORDER — HYDROMORPHONE HCL PF 4 MG/ML IJ SOLN: 1.0000 mg | Freq: Once | INTRAMUSCULAR | Status: DC

## 2013-02-24 MED ORDER — HYDROMORPHONE HCL PF 4 MG/ML IJ SOLN
1.0000 mg | Freq: Once | INTRAMUSCULAR | Status: AC
  Administered 2013-02-24: 1 mg via INTRAVENOUS

## 2013-02-24 MED ORDER — MORPHINE SULFATE ER 30 MG PO TBCR: 30.0000 mg | EXTENDED_RELEASE_TABLET | Freq: Two times a day (BID) | ORAL | Status: DC

## 2013-02-24 MED ORDER — SODIUM CHLORIDE 0.9 % IV SOLN: Freq: Every day | INTRAVENOUS | Status: DC

## 2013-02-24 MED ORDER — SODIUM CHLORIDE 0.9 % IV SOLN
Freq: Once | INTRAVENOUS | Status: AC
  Administered 2013-02-24: 14:00:00 via INTRAVENOUS

## 2013-02-24 MED ORDER — HYDROMORPHONE HCL PF 4 MG/ML IJ SOLN
INTRAMUSCULAR | Status: AC
  Filled 2013-02-24: qty 1

## 2013-02-24 NOTE — Addendum Note (Signed)
Encounter addended by: Judge Stall, [place] on: 02/24/2013  1:30 PM<BR>     Documentation filed: Rx Order Verification

## 2013-02-24 NOTE — Progress Notes (Signed)
  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  Towards the end of his therapy he did experience esophageal problems limiting his solid food intake.  He was able to continue working throughout most of his treatment.  Plan: The patient has completed radiation treatment. The patient will return to radiation oncology clinic for routine followup in one month. I advised them to call or return sooner if they have any questions or concerns related to their recovery or treatment.  -----------------------------------  Blair Promise, PhD, MD

## 2013-02-24 NOTE — Telephone Encounter (Signed)
Called and left a message for Mr. Ladnier to see how he is feeling today.  Asked that he call back.

## 2013-02-24 NOTE — Addendum Note (Signed)
Encounter addended by: Deirdre Evener, RN on: 02/24/2013  1:40 PM<BR>     Documentation filed: Orders

## 2013-02-24 NOTE — Patient Instructions (Signed)
Dehydration, Adult Dehydration is when you lose more fluids from the body than you take in. Vital organs like the kidneys, brain, and heart cannot function without a proper amount of fluids and salt. Any loss of fluids from the body can cause dehydration.  CAUSES   Vomiting.  Diarrhea.  Excessive sweating.  Excessive urine output.  Fever. SYMPTOMS  Mild dehydration  Thirst.  Dry lips.  Slightly dry mouth. Moderate dehydration  Very dry mouth.  Sunken eyes.  Skin does not bounce back quickly when lightly pinched and released.  Dark urine and decreased urine production.  Decreased tear production.  Headache. Severe dehydration  Very dry mouth.  Extreme thirst.  Rapid, weak pulse (more than 100 beats per minute at rest).  Cold hands and feet.  Not able to sweat in spite of heat and temperature.  Rapid breathing.  Blue lips.  Confusion and lethargy.  Difficulty being awakened.  Minimal urine production.  No tears. DIAGNOSIS  Your caregiver will diagnose dehydration based on your symptoms and your exam. Blood and urine tests will help confirm the diagnosis. The diagnostic evaluation should also identify the cause of dehydration. TREATMENT  Treatment of mild or moderate dehydration can often be done at home by increasing the amount of fluids that you drink. It is best to drink small amounts of fluid more often. Drinking too much at one time can make vomiting worse. Refer to the home care instructions below. Severe dehydration needs to be treated at the hospital where you will probably be given intravenous (IV) fluids that contain water and electrolytes. HOME CARE INSTRUCTIONS   Ask your caregiver about specific rehydration instructions.  Drink enough fluids to keep your urine clear or pale yellow.  Drink small amounts frequently if you have nausea and vomiting.  Eat as you normally do.  Avoid:  Foods or drinks high in sugar.  Carbonated  drinks.  Juice.  Extremely hot or cold fluids.  Drinks with caffeine.  Fatty, greasy foods.  Alcohol.  Tobacco.  Overeating.  Gelatin desserts.  Wash your hands well to avoid spreading bacteria and viruses.  Only take over-the-counter or prescription medicines for pain, discomfort, or fever as directed by your caregiver.  Ask your caregiver if you should continue all prescribed and over-the-counter medicines.  Keep all follow-up appointments with your caregiver. SEEK MEDICAL CARE IF:  You have abdominal pain and it increases or stays in one area (localizes).  You have a rash, stiff neck, or severe headache.  You are irritable, sleepy, or difficult to awaken.  You are weak, dizzy, or extremely thirsty. SEEK IMMEDIATE MEDICAL CARE IF:   You are unable to keep fluids down or you get worse despite treatment.  You have frequent episodes of vomiting or diarrhea.  You have blood or green matter (bile) in your vomit.  You have blood in your stool or your stool looks black and tarry.  You have not urinated in 6 to 8 hours, or you have only urinated a small amount of very dark urine.  You have a fever.  You faint. MAKE SURE YOU:   Understand these instructions.  Will watch your condition.  Will get help right away if you are not doing well or get worse. Document Released: 12/25/2004 Document Revised: 03/19/2011 Document Reviewed: 08/14/2010 Exit[employer] Patient Information 2014 [employer].  

## 2013-02-24 NOTE — Addendum Note (Signed)
Encounter addended by: Deirdre Evener, RN on: 02/24/2013  1:33 PM<BR>     Documentation filed: Orders

## 2013-02-24 NOTE — Progress Notes (Signed)
Radiation Oncology         (336) [phone] ________________________________  Name: Steve [name] MRN: [id number]  Date: 02/24/2013  DOB: [birth year]  Follow-Up Visit Note  CC: [name], MD  No ref. provider found  Diagnosis:   Stage III-a non-small cell lung cancer  Interval Since Last Radiation:  11 days  Narrative:  The patient returns today for IV fluid supplementation. Patient was seen after his liter of IV fluids was given today. He continues to have significant esophagitis limiting his by mouth intake. Patient has started on Diflucan for empiric coverage of candidiasis esophagitis. He was started on Dilaudid yesterday which is somewhat helpful but he can still only consume butterscotch pudding and small amounts of liquid.                              ALLERGIES:  has No Known Allergies.  Meds: Current Outpatient Prescriptions  Medication Sig Dispense Refill  . Alum & Mag Hydroxide-Simeth (MAGIC MOUTHWASH) SOLN Take 10 mLs by mouth 4 (four) times daily. Alternate with the Carafate Suspension. 1 refill      . aspirin 325 MG tablet Take 325 mg by mouth daily.      Marland Kitchen diltiazem (CARDIZEM CD) 180 MG 24 hr capsule Take 1 capsule (180 mg total) by mouth daily.  90 capsule  3  . fluconazole (DIFLUCAN) 100 MG tablet Take 1 tablet (100 mg total) by mouth daily.  8 tablet  0  . hyaluronate sodium (RADIAPLEXRX) GEL Apply 1 application topically 2 (two) times daily.      Marland Kitchen HYDROcodone-acetaminophen (HYCET) 7.5-325 mg/15 ml solution Take 10 mLs by mouth 4 (four) times daily as needed for moderate pain.  120 mL  0  . HYDROcodone-acetaminophen (NORCO/VICODIN) 5-325 MG per tablet       . HYDROmorphone (DILAUDID) 4 MG tablet Take 1 tablet (4 mg total) by mouth every 4 (four) hours as needed for severe pain.  30 tablet  0  . ibuprofen (ADVIL,MOTRIN) 200 MG tablet Take 200 mg by mouth every 6 (six) hours as needed.      Marland Kitchen morphine (MS CONTIN) 30 MG 12 hr tablet Take 1 tablet (30 mg total) by  mouth every 12 (twelve) hours.  30 tablet  0  . omeprazole (PRILOSEC) 20 MG capsule Take 20 mg by mouth daily.      Marland Kitchen oxyCODONE-acetaminophen (PERCOCET/ROXICET) 5-325 MG per tablet Take 1 tablet by mouth every 4 (four) hours as needed for severe pain.  30 tablet  0  . ranitidine (ZANTAC) 150 MG tablet Take 150 mg by mouth at bedtime.      . simvastatin (ZOCOR) 20 MG tablet Take one tablet by mouth once daily to lower cholesterol  90 tablet  3  . sucralfate (CARAFATE) 1 GM/10ML suspension Take 1 g by mouth 4 (four) times daily -  with meals and at bedtime. Refill from original script written on 01/26/13.  1 refill on 02/16/13 script. Dr. Blair Promise      . tamsulosin (FLOMAX) 0.4 MG CAPS capsule Take 1 capsule (0.4 mg total) by mouth at bedtime.  30 capsule  0  . temazepam (RESTORIL) 15 MG capsule Take 1 capsule (15 mg total) by mouth at bedtime as needed for sleep.  30 capsule  0   No current facility-administered medications for this visit.    Physical Findings: The patient is in no acute distress. Patient  is alert and oriented. He is accompanied by his wife on evaluation today   Lab Findings: Lab Results  Component Value Date   WBC 4.1 02/23/2013   HGB 12.8* 02/23/2013   HCT 38.2* 02/23/2013   MCV 89.4 02/23/2013   PLT 297 02/23/2013      Chemistry      Component Value Date/Time   NA 143 02/23/2013 1005   NA 143 11/17/2012 0805   K 3.7 02/23/2013 1005   K 4.8 11/17/2012 0805   CL 103 11/17/2012 0805   CO2 27 02/23/2013 1005   CO2 26 11/17/2012 0805   BUN 19.6 02/23/2013 1005   BUN 13 11/17/2012 0805   CREATININE 1.0 02/23/2013 1005   CREATININE 0.95 11/17/2012 0805      Component Value Date/Time   CALCIUM 10.7* 02/23/2013 1005   CALCIUM 10.0 11/17/2012 0805   ALKPHOS 68 02/09/2013 0923   ALKPHOS 82 05/09/2012 0854   AST 15 02/09/2013 0923   AST 26 05/09/2012 0854   ALT 19 02/09/2013 0923   ALT 39 05/09/2012 0854   BILITOT 0.44 02/09/2013 0923   BILITOT 0.4 05/09/2012 0854        Radiographic Findings: No results found.  Impression:  The patient is still quite symptomatic from esophagitis.  Plan:   I will add long-acting narcotic (MS Contin). The patient and his wife understand he is not to take his Dilaudid at the same time as MS Contin. He will return tomorrow for additional IV fluids. ____________________________________ Blair Promise, MD

## 2013-02-25 ENCOUNTER — Telehealth: Payer: Self-pay | Admitting: Oncology

## 2013-02-25 ENCOUNTER — Ambulatory Visit: Payer: Medicare HMO

## 2013-02-25 ENCOUNTER — Encounter: Payer: Self-pay | Admitting: Oncology

## 2013-02-25 ENCOUNTER — Encounter (INDEPENDENT_AMBULATORY_CARE_PROVIDER_SITE_OTHER): Payer: Self-pay

## 2013-02-25 ENCOUNTER — Ambulatory Visit
Admission: RE | Admit: 2013-02-25 | Discharge: 2013-02-25 | Disposition: A | Discharge status: Home / Self Care | Payer: Medicare HMO | Source: Ambulatory Visit | Attending: Family Medicine | Admitting: Family Medicine

## 2013-02-25 ENCOUNTER — Ambulatory Visit (HOSPITAL_BASED_OUTPATIENT_CLINIC_OR_DEPARTMENT_OTHER): Payer: Medicare HMO

## 2013-02-25 ENCOUNTER — Other Ambulatory Visit: Payer: Self-pay | Admitting: Oncology

## 2013-02-25 DIAGNOSIS — C3490 Malignant neoplasm of unspecified part of unspecified bronchus or lung: Secondary | ICD-10-CM

## 2013-02-25 DIAGNOSIS — C3430 Malignant neoplasm of lower lobe, unspecified bronchus or lung: Secondary | ICD-10-CM

## 2013-02-25 LAB — COMPREHENSIVE METABOLIC PANEL (CC13)
ALK PHOS: 63 U/L (ref 40–150)
ALT: 26 U/L (ref 0–55)
AST: 28 U/L (ref 5–34)
Albumin: 3.3 g/dL — ABNORMAL LOW (ref 3.5–5.0)
Anion Gap: 12 mEq/L — ABNORMAL HIGH (ref 3–11)
BUN: 15.5 mg/dL (ref 7.0–26.0)
CO2: 24 mEq/L (ref 22–29)
Calcium: 9.4 mg/dL (ref 8.4–10.4)
Chloride: 105 mEq/L (ref 98–109)
Creatinine: 0.8 mg/dL (ref 0.7–1.3)
Glucose: 107 mg/dl (ref 70–140)
Potassium: 3.3 mEq/L — ABNORMAL LOW (ref 3.5–5.1)
SODIUM: 141 meq/L (ref 136–145)
Total Bilirubin: 0.59 mg/dL (ref 0.20–1.20)
Total Protein: 6.7 g/dL (ref 6.4–8.3)

## 2013-02-25 LAB — CBC WITH DIFFERENTIAL/PLATELET
BASO%: 0.4 % (ref 0.0–2.0)
BASOS ABS: 0 10*3/uL (ref 0.0–0.1)
EOS%: 1.3 % (ref 0.0–7.0)
Eosinophils Absolute: 0.1 10*3/uL (ref 0.0–0.5)
HCT: 35.4 % — ABNORMAL LOW (ref 38.4–49.9)
HEMOGLOBIN: 11.9 g/dL — AB (ref 13.0–17.1)
LYMPH#: 0.7 10*3/uL — AB (ref 0.9–3.3)
LYMPH%: 15.2 % (ref 14.0–49.0)
MCH: 29.8 pg (ref 27.2–33.4)
MCHC: 33.6 g/dL (ref 32.0–36.0)
MCV: 88.7 fL (ref 79.3–98.0)
MONO#: 0.6 10*3/uL (ref 0.1–0.9)
MONO%: 13.9 % (ref 0.0–14.0)
NEUT#: 3.1 10*3/uL (ref 1.5–6.5)
NEUT%: 69.2 % (ref 39.0–75.0)
Platelets: 251 10*3/uL (ref 140–400)
RBC: 3.99 10*6/uL — ABNORMAL LOW (ref 4.20–5.82)
RDW: 16.3 % — AB (ref 11.0–14.6)
WBC: 4.5 10*3/uL (ref 4.0–10.3)
nRBC: 0 % (ref 0–0)

## 2013-02-25 MED ORDER — SODIUM CHLORIDE 0.9 % IV SOLN
Freq: Once | INTRAVENOUS | Status: AC
  Administered 2013-02-25: 13:00:00 via INTRAVENOUS

## 2013-02-25 NOTE — Progress Notes (Signed)
Talked to Mr. Sarli in the infusion room.  Advised him to start taking omeprazole (Prilosec) 20 mg PO daily at bedtime and ranitidine (zantac) 150 mg PO TID for reflux.  Advised Mr. Picardi that I would call in the Prilosec to [facility].  He verbalized agreement.  Mr. Yun does have a raised red rash on his arms, chest and legs.  He has taken benadryl and is using Cetaphil cream.  He states that he has had it since Monday and it is a little better today.  Reported the finding to his infusion nurse, Ailene Ravel, RN.  He reports that he is still only able to swallow sips of water and butterscotch pudding.  He has been taking the MS Contin q 12 hours and dilaudid 4 mg for breakthrough pain.

## 2013-02-25 NOTE — Patient Instructions (Signed)
Dehydration, Adult  Dehydration means your body does not have as much fluid as it needs. Your kidneys, brain, and heart will not work properly without the right amount of fluids and salt.   HOME CARE   Ask your doctor how to replace body fluid losses (rehydrate).   Drink enough fluids to keep your pee (urine) clear or pale yellow.   Drink small amounts of fluids often if you feel sick to your stomach (nauseous) or throw up (vomit).   Eat like you normally do.   Avoid:   Foods or drinks high in sugar.   Bubbly (carbonated) drinks.   Juice.   Very hot or cold fluids.   Drinks with caffeine.   Fatty, greasy foods.   Alcohol.   Tobacco.   Eating too much.   Gelatin desserts.   Wash your hands to avoid spreading germs (bacteria, viruses).   Only take medicine as told by your doctor.   Keep all doctor visits as told.  GET HELP RIGHT AWAY IF:    You cannot drink something without throwing up.   You get worse even with treatment.   Your vomit has blood in it or looks greenish.   Your poop (stool) has blood in it or looks black and tarry.   You have not peed in 6 to 8 hours.   You pee a small amount of very dark pee.   You have a fever.   You pass out (faint).   You have belly (abdominal) pain that gets worse or stays in one spot (localizes).   You have a rash, stiff neck, or bad headache.   You get easily annoyed, sleepy, or are hard to wake up.   You feel weak, dizzy, or very thirsty.  MAKE SURE YOU:    Understand these instructions.   Will watch your condition.   Will get help right away if you are not doing well or get worse.  Document Released: 10/21/2008 Document Revised: 03/19/2011 Document Reviewed: 08/14/2010  [employer] Patient Information 2014 [employer].

## 2013-02-25 NOTE — Telephone Encounter (Signed)
Called Steve [name] to see how he is doing today.  He said the morphine is working "OK."  He also said he has a rash all over that his itching.  Advised him to take Benadryl over the counter.

## 2013-02-25 NOTE — Telephone Encounter (Signed)
Called in prescription for omeprazole (PRILOSEC) 20 MG capsule 02/25/2013 Sig - Route: Take 20 mg by mouth daily at . Disp. 30 tablets, 0 refills per Dr. Sondra Come to [facility] in [place].

## 2013-02-26 ENCOUNTER — Telehealth: Payer: Self-pay | Admitting: Oncology

## 2013-02-26 ENCOUNTER — Other Ambulatory Visit: Payer: Self-pay | Admitting: Oncology

## 2013-02-26 ENCOUNTER — Ambulatory Visit: Payer: Medicare HMO | Admitting: Nutrition

## 2013-02-26 ENCOUNTER — Ambulatory Visit (HOSPITAL_BASED_OUTPATIENT_CLINIC_OR_DEPARTMENT_OTHER): Payer: Medicare HMO

## 2013-02-26 ENCOUNTER — Encounter: Payer: Self-pay | Admitting: Radiation Oncology

## 2013-02-26 ENCOUNTER — Ambulatory Visit
Admission: RE | Admit: 2013-02-26 | Discharge: 2013-02-26 | Disposition: A | Discharge status: Home / Self Care | Payer: Medicare HMO | Source: Ambulatory Visit | Attending: Radiation Oncology | Admitting: Radiation Oncology

## 2013-02-26 VITALS — BP 105/64 | HR 71 | Temp 97.5°F | Ht 73.0 in | Wt 195.2 lb

## 2013-02-26 VITALS — BP 109/66 | HR 80 | Temp 96.8°F | Resp 18

## 2013-02-26 DIAGNOSIS — C3490 Malignant neoplasm of unspecified part of unspecified bronchus or lung: Secondary | ICD-10-CM

## 2013-02-26 DIAGNOSIS — C3430 Malignant neoplasm of lower lobe, unspecified bronchus or lung: Secondary | ICD-10-CM

## 2013-02-26 DIAGNOSIS — Z5189 Encounter for other specified aftercare: Secondary | ICD-10-CM

## 2013-02-26 MED ORDER — SODIUM CHLORIDE 0.9 % IV SOLN
333.0000 mL/h | Freq: Once | INTRAVENOUS | Status: AC
  Administered 2013-02-26: 333 mL/h via INTRAVENOUS
  Filled 2013-02-26: qty 1000

## 2013-02-26 NOTE — Patient Instructions (Signed)
Dehydration, Adult Dehydration is when you lose more fluids from the body than you take in. Vital organs like the kidneys, brain, and heart cannot function without a proper amount of fluids and salt. Any loss of fluids from the body can cause dehydration.  CAUSES   Vomiting.  Diarrhea.  Excessive sweating.  Excessive urine output.  Fever. SYMPTOMS  Mild dehydration  Thirst.  Dry lips.  Slightly dry mouth. Moderate dehydration  Very dry mouth.  Sunken eyes.  Skin does not bounce back quickly when lightly pinched and released.  Dark urine and decreased urine production.  Decreased tear production.  Headache. Severe dehydration  Very dry mouth.  Extreme thirst.  Rapid, weak pulse (more than 100 beats per minute at rest).  Cold hands and feet.  Not able to sweat in spite of heat and temperature.  Rapid breathing.  Blue lips.  Confusion and lethargy.  Difficulty being awakened.  Minimal urine production.  No tears. DIAGNOSIS  Your caregiver will diagnose dehydration based on your symptoms and your exam. Blood and urine tests will help confirm the diagnosis. The diagnostic evaluation should also identify the cause of dehydration. TREATMENT  Treatment of mild or moderate dehydration can often be done at home by increasing the amount of fluids that you drink. It is best to drink small amounts of fluid more often. Drinking too much at one time can make vomiting worse. Refer to the home care instructions below. Severe dehydration needs to be treated at the hospital where you will probably be given intravenous (IV) fluids that contain water and electrolytes. HOME CARE INSTRUCTIONS   Ask your caregiver about specific rehydration instructions.  Drink enough fluids to keep your urine clear or pale yellow.  Drink small amounts frequently if you have nausea and vomiting.  Eat as you normally do.  Avoid:  Foods or drinks high in sugar.  Carbonated  drinks.  Juice.  Extremely hot or cold fluids.  Drinks with caffeine.  Fatty, greasy foods.  Alcohol.  Tobacco.  Overeating.  Gelatin desserts.  Wash your hands well to avoid spreading bacteria and viruses.  Only take over-the-counter or prescription medicines for pain, discomfort, or fever as directed by your caregiver.  Ask your caregiver if you should continue all prescribed and over-the-counter medicines.  Keep all follow-up appointments with your caregiver. SEEK MEDICAL CARE IF:  You have abdominal pain and it increases or stays in one area (localizes).  You have a rash, stiff neck, or severe headache.  You are irritable, sleepy, or difficult to awaken.  You are weak, dizzy, or extremely thirsty. SEEK IMMEDIATE MEDICAL CARE IF:   You are unable to keep fluids down or you get worse despite treatment.  You have frequent episodes of vomiting or diarrhea.  You have blood or green matter (bile) in your vomit.  You have blood in your stool or your stool looks black and tarry.  You have not urinated in 6 to 8 hours, or you have only urinated a small amount of very dark urine.  You have a fever.  You faint. MAKE SURE YOU:   Understand these instructions.  Will watch your condition.  Will get help right away if you are not doing well or get worse. Document Released: 12/25/2004 Document Revised: 03/19/2011 Document Reviewed: 08/14/2010 Exit[employer] Patient Information 2014 [employer].  

## 2013-02-26 NOTE — Progress Notes (Signed)
Mr. Hughett rates his pain as dull today at a 1/10.  He is taking ms contin q 12 hours and dilaudid 4 mg in between as needed.  He has only had to take it once.  He started the omeprazole and zantac this morning.  He was able to eat scrambled eggs last night and a little bit this moring.  He continues to have a red scattered rash on his arms, legs and chest.

## 2013-02-26 NOTE — Progress Notes (Signed)
Nutrition followup completed with patient while receiving IV fluids.  Patient reports severe esophagitis.  He reports he does not take any medications because they do not help.  He states pain medication does no good, and Carafate is "useless".  He does not appear to tolerate hot or cold liquids, but seems to do better with room temperature liquids.  With the exception of orange sherbet which is what he is consuming while receiving IV fluids. Patient complains of overly sweet drinks.  Patient requesting information on potential foods that wouldn't hurt his throat.  Weight documented as 195 pounds decreased from 203.6 pounds January 26.  Nutrition diagnosis: Unintended weight loss continues.  Intervention: Patient educated to discuss medications with physician.  I educated him on foods that he could try and assess for tolerance.  Educated patient on blenderizing foods and making liquids thinner for improved tolerance.  Provided a fact sheet for him to refer to and also provided butter pecan flavored Glucerna as a flavor patient may tolerate.  Monitoring, evaluation, goals: Patient will work to increase oral intake to minimize further weight loss.  Next visit: Patient will contact me for questions or concerns.

## 2013-02-26 NOTE — Telephone Encounter (Signed)
Called [facility] to see why the Prilosec prescription is not ready.  Per the pharmacist, they will get it ready for him today.  He said they were waiting for approval for a 90 day supply.

## 2013-02-26 NOTE — Progress Notes (Signed)
Radiation Oncology         [phone] ________________________________  Name: Steve [name] MRN: [id number]  Date: 02/26/2013  DOB: 10/17/1940  Follow-Up Visit Note  CC: Steve Serve, MD  Steve Serve, MD  Diagnosis:   Stage III-a non-small cell lung cancer   Interval Since Last Radiation:  13  days  Narrative:  The patient returns today for close follow-up.   He is seen before his scheduled IV fluid supplementation. Patient has noticed some mild improvement was able to consume some small amounts of scrambled eggs last night and this morning. Patient did not start his gastric acid suppression regimen until this morning. He'll be taking Prilosec daily along with Zantac 3 times a day. This regimen was discussed with pharmacy as it relates to the patient's symptoms (Prilosec daily and Zantac 3 times a day).  His rash of unknown etiology seems to be controlled with oral Benadryl at this time.  He would like to meet with the nutritionist during his IV fluid supplementation this afternoon.                        ALLERGIES:  has No Known Allergies.  Meds: Current Outpatient Prescriptions  Medication Sig Dispense Refill  . aspirin 325 MG tablet Take 325 mg by mouth daily.      Marland Kitchen diltiazem (CARDIZEM CD) 180 MG 24 hr capsule Take 1 capsule (180 mg total) by mouth daily.  90 capsule  3  . fluconazole (DIFLUCAN) 100 MG tablet Take 1 tablet (100 mg total) by mouth daily.  8 tablet  0  . HYDROmorphone (DILAUDID) 4 MG tablet Take 1 tablet (4 mg total) by mouth every 4 (four) hours as needed for severe pain.  30 tablet  0  . morphine (MS CONTIN) 30 MG 12 hr tablet Take 1 tablet (30 mg total) by mouth every 12 (twelve) hours.  30 tablet  0  . omeprazole (PRILOSEC) 20 MG capsule Take 20 mg by mouth daily. Disp. 30 tablets, 0 refills      . simvastatin (ZOCOR) 20 MG tablet Take one tablet by mouth once daily to lower cholesterol  90 tablet  3  . sucralfate (CARAFATE) 1 GM/10ML suspension Take 1 g  by mouth 4 (four) times daily -  with meals and at bedtime. Refill from original script written on 01/26/13.  1 refill on 02/16/13 script. Dr. Blair Promise      . tamsulosin (FLOMAX) 0.4 MG CAPS capsule Take 1 capsule (0.4 mg total) by mouth at bedtime.  30 capsule  0  . temazepam (RESTORIL) 15 MG capsule Take 1 capsule (15 mg total) by mouth at bedtime as needed for sleep.  30 capsule  0  . Alum & Mag Hydroxide-Simeth (MAGIC MOUTHWASH) SOLN Take 10 mLs by mouth 4 (four) times daily. Alternate with the Carafate Suspension. 1 refill      . hyaluronate sodium (RADIAPLEXRX) GEL Apply 1 application topically 2 (two) times daily.      Marland Kitchen HYDROcodone-acetaminophen (HYCET) 7.5-325 mg/15 ml solution Take 10 mLs by mouth 4 (four) times daily as needed for moderate pain.  120 mL  0  . HYDROcodone-acetaminophen (NORCO/VICODIN) 5-325 MG per tablet       . ibuprofen (ADVIL,MOTRIN) 200 MG tablet Take 200 mg by mouth every 6 (six) hours as needed.      Marland Kitchen oxyCODONE-acetaminophen (PERCOCET/ROXICET) 5-325 MG per tablet Take 1 tablet by mouth every 4 (four) hours as needed  for severe pain.  30 tablet  0  . ranitidine (ZANTAC) 150 MG tablet Take 150 mg by mouth at bedtime.       No current facility-administered medications for this encounter.   Facility-Administered Medications Ordered in Other Encounters  Medication Dose Route Frequency Provider Last Rate Last Dose  . sodium chloride 0.9 % 1,000 mL with potassium chloride 10 mEq infusion  333 mL/hr Intravenous Once Blair Promise, MD   333 mL/hr at 02/26/13 1347    Physical Findings: The patient is in no acute distress. Patient is alert and oriented.  Accompanied by his wife today  height is 6\' 1"  (1.854 m) and weight is 195 lb 3.2 oz (88.542 kg). His temperature is 97.5 F (36.4 C). His blood pressure is 105/64 and his pulse is 71. .  The oral cavity is mildly dry without secondary infection. The lungs are clear. The heart is in a regular rhythm at this time. The  abdomen is soft and nontender with normal bowel sounds. Peripheral pulse in the left radius is strong and regular.  Lab Findings: Lab Results  Component Value Date   WBC 4.5 02/25/2013   HGB 11.9* 02/25/2013   HCT 35.4* 02/25/2013   MCV 88.7 02/25/2013   PLT 251 02/25/2013      Radiographic Findings: No results found.  Impression:  The patient is slowly recovering from the effects of radiation.    Plan:  The patient will proceed with IV fluid supplementation this afternoon. Patient's potassium yesterday was 3.3 and he will receive 10 mEq of potassium with his IV fluids. He is scheduled for IV fluids tomorrow and we have tentatively scheduled him for Saturday morning but at this time the patient feels he may be able to avoid this Saturday fluid supplementation. ____________________________________ Blair Promise, MD

## 2013-02-27 ENCOUNTER — Other Ambulatory Visit: Payer: Self-pay | Admitting: *Deleted

## 2013-02-27 ENCOUNTER — Ambulatory Visit (HOSPITAL_BASED_OUTPATIENT_CLINIC_OR_DEPARTMENT_OTHER): Payer: Medicare HMO

## 2013-02-27 ENCOUNTER — Ambulatory Visit
Admission: RE | Admit: 2013-02-27 | Discharge: 2013-02-27 | Disposition: A | Discharge status: Home / Self Care | Payer: Medicare HMO | Source: Ambulatory Visit | Attending: Radiation Oncology | Admitting: Radiation Oncology

## 2013-02-27 VITALS — BP 121/66 | HR 77 | Temp 97.0°F | Resp 18

## 2013-02-27 DIAGNOSIS — C3490 Malignant neoplasm of unspecified part of unspecified bronchus or lung: Secondary | ICD-10-CM

## 2013-02-27 DIAGNOSIS — C3430 Malignant neoplasm of lower lobe, unspecified bronchus or lung: Secondary | ICD-10-CM

## 2013-02-27 DIAGNOSIS — Z5189 Encounter for other specified aftercare: Secondary | ICD-10-CM

## 2013-02-27 MED ORDER — SODIUM CHLORIDE 0.9 % IV SOLN
Freq: Once | INTRAVENOUS | Status: AC
  Administered 2013-02-27: 13:00:00 via INTRAVENOUS
  Filled 2013-02-27: qty 1000

## 2013-02-27 MED ORDER — HYDROCODONE-ACETAMINOPHEN 5-325 MG PO TABS: 1.0000 | ORAL_TABLET | ORAL | Status: DC | PRN

## 2013-02-27 NOTE — Progress Notes (Signed)
Department of Radiation Oncology  Phone:  [phone] Fax:        [phone]   Name: Steve [name] MRN: [id number]  DOB: 1940/01/15  Date: 02/27/2013  Follow Up Visit Note  Diagnosis: Stage III lung cancer  Summary and Interval since last radiation: 2 weeks  Interval History: Steve [name] presents today for followup.  I was asked to see him to evaluate his rash. He unfortunately has a urticaria type rash over his trunk and upper extremities. He states this is getting better since yesterday. He's been taking Benadryl. He has also stopped taking the MS Contin and Dilaudid. His last dose was yesterday morning. It's not itching as bad today. He is taking hydrocodone for pain and having good relief of the symptoms. His swallowing has improved. He has canceled his fluids for tomorrow. He has not had any breathing problems.  Allergies: No Known Allergies  Medications:  Current Outpatient Prescriptions  Medication Sig Dispense Refill  . Alum & Mag Hydroxide-Simeth (MAGIC MOUTHWASH) SOLN Take 10 mLs by mouth 4 (four) times daily. Alternate with the Carafate Suspension. 1 refill      . aspirin 325 MG tablet Take 325 mg by mouth daily.      Marland Kitchen diltiazem (CARDIZEM CD) 180 MG 24 hr capsule Take 1 capsule (180 mg total) by mouth daily.  90 capsule  3  . fluconazole (DIFLUCAN) 100 MG tablet Take 1 tablet (100 mg total) by mouth daily.  8 tablet  0  . hyaluronate sodium (RADIAPLEXRX) GEL Apply 1 application topically 2 (two) times daily.      Marland Kitchen HYDROcodone-acetaminophen (NORCO/VICODIN) 5-325 MG per tablet Take 1 tablet by mouth every 4 (four) hours as needed for moderate pain.  60 tablet  0  . ibuprofen (ADVIL,MOTRIN) 200 MG tablet Take 200 mg by mouth every 6 (six) hours as needed.      Marland Kitchen omeprazole (PRILOSEC) 20 MG capsule Take 20 mg by mouth daily. Disp. 30 tablets, 0 refills      . oxyCODONE-acetaminophen (PERCOCET/ROXICET) 5-325 MG per tablet Take 1 tablet by mouth every 4 (four) hours as needed  for severe pain.  30 tablet  0  . ranitidine (ZANTAC) 150 MG tablet Take 150 mg by mouth at bedtime.      . simvastatin (ZOCOR) 20 MG tablet Take one tablet by mouth once daily to lower cholesterol  90 tablet  3  . sucralfate (CARAFATE) 1 GM/10ML suspension Take 1 g by mouth 4 (four) times daily -  with meals and at bedtime. Refill from original script written on 01/26/13.  1 refill on 02/16/13 script. Dr. Blair Promise      . tamsulosin (FLOMAX) 0.4 MG CAPS capsule Take 1 capsule (0.4 mg total) by mouth at bedtime.  30 capsule  0  . temazepam (RESTORIL) 15 MG capsule Take 1 capsule (15 mg total) by mouth at bedtime as needed for sleep.  30 capsule  0   No current facility-administered medications for this encounter.    Physical Exam:  There were no vitals filed for this visit. pink rash over his entire torso and arms.  IMPRESSION: Steve [name] is a 73 y.o. male with new rash likely associated with Dilaudid or MS Contin.  PLAN:  We'll stop the MS Contin and Dilaudid. I've refilled his hydrocodone. I've encouraged him to give [place] a call if he develops any further rash or 10 to the emergency room if he develops breathing issues. It sounds like he is getting better.  Thea Silversmith, MD

## 2013-02-27 NOTE — Progress Notes (Signed)
Steve [name] has a rash on his chest and back that looks like a sunburn.  He also has a rash on his arms and legs.  He said it is itchy.  He noticed the rash on his arms and legs on Saturday.  He said the torso rash started Tuesday. He is taking benadryl once a day and took once at 1:30.  He received IV fluids today.  He is reporting that his swallowing is better.  He was able to get down 2 ensures and 16 oz of water.  He did not have to take his MS Contin for pain this morning.  He did take dilaudid 4 mg once.    He is rating his pain as a 3/10 and it is a dull ache in his esophagus.  He canceled his IV fluid appointment for tomorrow.

## 2013-02-27 NOTE — Patient Instructions (Signed)
Dehydration, Adult Dehydration is when you lose more fluids from the body than you take in. Vital organs like the kidneys, brain, and heart cannot function without a proper amount of fluids and salt. Any loss of fluids from the body can cause dehydration.  CAUSES   Vomiting.  Diarrhea.  Excessive sweating.  Excessive urine output.  Fever. SYMPTOMS  Mild dehydration  Thirst.  Dry lips.  Slightly dry mouth. Moderate dehydration  Very dry mouth.  Sunken eyes.  Skin does not bounce back quickly when lightly pinched and released.  Dark urine and decreased urine production.  Decreased tear production.  Headache. Severe dehydration  Very dry mouth.  Extreme thirst.  Rapid, weak pulse (more than 100 beats per minute at rest).  Cold hands and feet.  Not able to sweat in spite of heat and temperature.  Rapid breathing.  Blue lips.  Confusion and lethargy.  Difficulty being awakened.  Minimal urine production.  No tears. DIAGNOSIS  Your caregiver will diagnose dehydration based on your symptoms and your exam. Blood and urine tests will help confirm the diagnosis. The diagnostic evaluation should also identify the cause of dehydration. TREATMENT  Treatment of mild or moderate dehydration can often be done at home by increasing the amount of fluids that you drink. It is best to drink small amounts of fluid more often. Drinking too much at one time can make vomiting worse. Refer to the home care instructions below. Severe dehydration needs to be treated at the hospital where you will probably be given intravenous (IV) fluids that contain water and electrolytes. HOME CARE INSTRUCTIONS   Ask your caregiver about specific rehydration instructions.  Drink enough fluids to keep your urine clear or pale yellow.  Drink small amounts frequently if you have nausea and vomiting.  Eat as you normally do.  Avoid:  Foods or drinks high in sugar.  Carbonated  drinks.  Juice.  Extremely hot or cold fluids.  Drinks with caffeine.  Fatty, greasy foods.  Alcohol.  Tobacco.  Overeating.  Gelatin desserts.  Wash your hands well to avoid spreading bacteria and viruses.  Only take over-the-counter or prescription medicines for pain, discomfort, or fever as directed by your caregiver.  Ask your caregiver if you should continue all prescribed and over-the-counter medicines.  Keep all follow-up appointments with your caregiver. SEEK MEDICAL CARE IF:  You have abdominal pain and it increases or stays in one area (localizes).  You have a rash, stiff neck, or severe headache.  You are irritable, sleepy, or difficult to awaken.  You are weak, dizzy, or extremely thirsty. SEEK IMMEDIATE MEDICAL CARE IF:   You are unable to keep fluids down or you get worse despite treatment.  You have frequent episodes of vomiting or diarrhea.  You have blood or green matter (bile) in your vomit.  You have blood in your stool or your stool looks black and tarry.  You have not urinated in 6 to 8 hours, or you have only urinated a small amount of very dark urine.  You have a fever.  You faint. MAKE SURE YOU:   Understand these instructions.  Will watch your condition.  Will get help right away if you are not doing well or get worse. Document Released: 12/25/2004 Document Revised: 03/19/2011 Document Reviewed: 08/14/2010 Exit[employer] Patient Information 2014 [employer].  

## 2013-02-28 ENCOUNTER — Ambulatory Visit: Payer: Medicare HMO

## 2013-03-02 ENCOUNTER — Telehealth: Payer: Self-pay | Admitting: Oncology

## 2013-03-02 NOTE — Telephone Encounter (Signed)
Called.

## 2013-03-02 NOTE — Telephone Encounter (Signed)
Called Mr. Raven.  He said that "he is turning the corner."  He feels better.  He went to a restaurant yesterday and was able to eat mashed potatoes.  He also said his rash is much better.  He thinks the morphine caused it.  Advised him to call with any questions or issues.

## 2013-03-10 ENCOUNTER — Telehealth: Payer: Self-pay | Admitting: Oncology

## 2013-03-10 ENCOUNTER — Encounter: Payer: Self-pay | Admitting: Radiation Oncology

## 2013-03-10 ENCOUNTER — Other Ambulatory Visit: Payer: Self-pay | Admitting: Radiation Oncology

## 2013-03-10 MED ORDER — OXYCODONE-ACETAMINOPHEN 5-325 MG PO TABS: 1.0000 | ORAL_TABLET | ORAL | Status: DC | PRN

## 2013-03-10 MED ORDER — SUCRALFATE 1 GM/10ML PO SUSP: 1.0000 g | Freq: Three times a day (TID) | ORAL | Status: DC

## 2013-03-10 MED ORDER — LEVOFLOXACIN 500 MG PO TABS: 500.0000 mg | ORAL_TABLET | Freq: Every day | ORAL | Status: DC

## 2013-03-10 NOTE — Progress Notes (Signed)
Radiation Oncology         [phone] ________________________________  Name: BRYLEN WAGAR MRN: [id number]  Date: 03/10/2013  DOB: April 04, 1940  Follow-Up Visit Note  CC: [name], MD  No ref. 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 He has shown some gradual improvement.  He can eat some soft foods but he is unable to eat spicy or hot foods at this time. He continues to use Zantac, Prilosec and Carafate.  I discussed today at again consideration for possible gastroenterology evaluation but at this point the patient feels comfortable with present course of improvement.  He has developed a productive cough of green sputum.                        ALLERGIES:  has No Known Allergies.  Meds: Current Outpatient Prescriptions  Medication Sig Dispense Refill  . Alum & Mag Hydroxide-Simeth (MAGIC MOUTHWASH) SOLN Take 10 mLs by mouth 4 (four) times daily. Alternate with the Carafate Suspension. 1 refill      . aspirin 325 MG tablet Take 325 mg by mouth daily.      Marland Kitchen diltiazem (CARDIZEM CD) 180 MG 24 hr capsule Take 1 capsule (180 mg total) by mouth daily.  90 capsule  3  . fluconazole (DIFLUCAN) 100 MG tablet Take 1 tablet (100 mg total) by mouth daily.  8 tablet  0  . hyaluronate sodium (RADIAPLEXRX) GEL Apply 1 application topically 2 (two) times daily.      Marland Kitchen HYDROcodone-acetaminophen (NORCO/VICODIN) 5-325 MG per tablet Take 1 tablet by mouth every 4 (four) hours as needed for moderate pain.  60 tablet  0  . ibuprofen (ADVIL,MOTRIN) 200 MG tablet Take 200 mg by mouth every 6 (six) hours as needed.      Marland Kitchen levofloxacin (LEVAQUIN) 500 MG tablet Take 1 tablet (500 mg total) by mouth daily.  7 tablet  0  . omeprazole (PRILOSEC) 20 MG capsule Take 20 mg by mouth daily. Disp. 30 tablets, 0 refills      .  oxyCODONE-acetaminophen (PERCOCET/ROXICET) 5-325 MG per tablet Take 1 tablet by mouth every 4 (four) hours as needed for severe pain.  60 tablet  0  . ranitidine (ZANTAC) 150 MG tablet Take 150 mg by mouth at bedtime.      . simvastatin (ZOCOR) 20 MG tablet Take one tablet by mouth once daily to lower cholesterol  90 tablet  3  . sucralfate (CARAFATE) 1 GM/10ML suspension Take 10 mLs (1 g total) by mouth 4 (four) times daily -  with meals and at bedtime. Refill from original script written on 01/26/13.  1 refill on 02/16/13 script. Dr. Nelda Severe [place]  420 mL  2  . tamsulosin (FLOMAX) 0.4 MG CAPS capsule Take 1 capsule (0.4 mg total) by mouth at bedtime.  30 capsule  0  . temazepam (RESTORIL) 15 MG capsule Take 1 capsule (15 mg total) by mouth at bedtime as needed for sleep.  30 capsule  0   No current facility-administered medications for this visit.    Physical Findings: The patient is in no acute distress. Patient is alert and oriented.  vitals were not taken for this visit.. The lung exam shows some congestion in the right lower lung field, mild  wheezing. The left lung is clear. the heart shows a regular rhythm and rate at this time without atrial fibrillation  Lab Findings: Lab Results  Component Value Date   WBC 4.5 02/25/2013   HGB 11.9* 02/25/2013   HCT 35.4* 02/25/2013   MCV 88.7 02/25/2013   PLT 251 02/25/2013       Radiographic Findings: No results found.  Impression:  The patient is slowly recovering from the effects of radiation.  I am concerned he has developed bronchitis/pneumonia in light of his green productive sputum.    Plan:  Patient will be placed on a seven-day course of Levaquin. He had refill on his Carafate. Also given him Percocet to use occasionally for his pain.  Patient will see Dr. Julien Nordmann next week with blood work and a chest CT scan prior to this visit.  ____________________________________ Blair Promise, MD

## 2013-03-10 NOTE — Telephone Encounter (Signed)
Steve [name] called and requested a refill on percocet.  He said he is still having pain with swallowing in his chest.  He said the percocet works better for him.  He said he is able to swallow some foods like scrambled eggs, puddings and soup.  He said everything is still burning in his chest when he swallows anything, even water.  He continues to take the omeprazole once a day and zantac three times a day.

## 2013-03-10 NOTE — Telephone Encounter (Signed)
Called in two prescriptions per Dr. Sondra Come to [facility].  Called in levaquin 500 mg tablets - take 1 tablet daily for 7 days.  Disp 7 tablets, 0 refills and sucralfate (CARAFATE) 1 GM/10ML suspension 420 mL 2 03/10/2013 Sig - Route: Take 10 mLs (1 g total) by mouth 4 (four) times daily - with meals and at bedtime. 2 refills.

## 2013-03-12 ENCOUNTER — Ambulatory Visit: Payer: Medicare HMO | Admitting: Radiation Oncology

## 2013-03-13 ENCOUNTER — Encounter: Payer: Self-pay | Admitting: Oncology

## 2013-03-17 ENCOUNTER — Other Ambulatory Visit (HOSPITAL_BASED_OUTPATIENT_CLINIC_OR_DEPARTMENT_OTHER): Payer: Medicare HMO

## 2013-03-17 ENCOUNTER — Telehealth: Payer: Self-pay | Admitting: Oncology

## 2013-03-17 ENCOUNTER — Ambulatory Visit (HOSPITAL_COMMUNITY)
Admission: RE | Admit: 2013-03-17 | Discharge: 2013-03-17 | Disposition: A | Discharge status: Home / Self Care | Payer: Medicare HMO | Source: Ambulatory Visit | Attending: Internal Medicine | Admitting: Internal Medicine

## 2013-03-17 ENCOUNTER — Encounter (INDEPENDENT_AMBULATORY_CARE_PROVIDER_SITE_OTHER): Payer: Self-pay

## 2013-03-17 ENCOUNTER — Encounter (HOSPITAL_COMMUNITY): Payer: Self-pay

## 2013-03-17 DIAGNOSIS — Z923 Personal history of irradiation: Secondary | ICD-10-CM | POA: Insufficient documentation

## 2013-03-17 DIAGNOSIS — R599 Enlarged lymph nodes, unspecified: Secondary | ICD-10-CM | POA: Insufficient documentation

## 2013-03-17 DIAGNOSIS — Z9221 Personal history of antineoplastic chemotherapy: Secondary | ICD-10-CM | POA: Insufficient documentation

## 2013-03-17 DIAGNOSIS — C3490 Malignant neoplasm of unspecified part of unspecified bronchus or lung: Secondary | ICD-10-CM | POA: Insufficient documentation

## 2013-03-17 DIAGNOSIS — C3430 Malignant neoplasm of lower lobe, unspecified bronchus or lung: Secondary | ICD-10-CM

## 2013-03-17 DIAGNOSIS — J984 Other disorders of lung: Secondary | ICD-10-CM | POA: Insufficient documentation

## 2013-03-17 LAB — COMPREHENSIVE METABOLIC PANEL (CC13)
ALT: 14 U/L (ref 0–55)
ANION GAP: 10 meq/L (ref 3–11)
AST: 19 U/L (ref 5–34)
Albumin: 3.2 g/dL — ABNORMAL LOW (ref 3.5–5.0)
Alkaline Phosphatase: 70 U/L (ref 40–150)
BILIRUBIN TOTAL: 0.37 mg/dL (ref 0.20–1.20)
BUN: 12.8 mg/dL (ref 7.0–26.0)
CO2: 26 mEq/L (ref 22–29)
CREATININE: 0.8 mg/dL (ref 0.7–1.3)
Calcium: 9.3 mg/dL (ref 8.4–10.4)
Chloride: 107 mEq/L (ref 98–109)
GLUCOSE: 91 mg/dL (ref 70–140)
Potassium: 3.8 mEq/L (ref 3.5–5.1)
SODIUM: 143 meq/L (ref 136–145)
TOTAL PROTEIN: 6.9 g/dL (ref 6.4–8.3)

## 2013-03-17 LAB — CBC WITH DIFFERENTIAL/PLATELET
BASO%: 1 % (ref 0.0–2.0)
Basophils Absolute: 0.1 10*3/uL (ref 0.0–0.1)
EOS ABS: 0.1 10*3/uL (ref 0.0–0.5)
EOS%: 1.7 % (ref 0.0–7.0)
HEMATOCRIT: 32.1 % — AB (ref 38.4–49.9)
HGB: 10.6 g/dL — ABNORMAL LOW (ref 13.0–17.1)
LYMPH%: 23.7 % (ref 14.0–49.0)
MCH: 30.2 pg (ref 27.2–33.4)
MCHC: 33 g/dL (ref 32.0–36.0)
MCV: 91.6 fL (ref 79.3–98.0)
MONO#: 0.9 10*3/uL (ref 0.1–0.9)
MONO%: 15.4 % — ABNORMAL HIGH (ref 0.0–14.0)
NEUT%: 58.2 % (ref 39.0–75.0)
NEUTROS ABS: 3.5 10*3/uL (ref 1.5–6.5)
PLATELETS: 510 10*3/uL — AB (ref 140–400)
RBC: 3.5 10*6/uL — ABNORMAL LOW (ref 4.20–5.82)
RDW: 19 % — ABNORMAL HIGH (ref 11.0–14.6)
WBC: 6.1 10*3/uL (ref 4.0–10.3)
lymph#: 1.4 10*3/uL (ref 0.9–3.3)

## 2013-03-17 MED ORDER — IOHEXOL 300 MG/ML  SOLN
80.0000 mL | Freq: Once | INTRAMUSCULAR | Status: AC | PRN
  Administered 2013-03-17: 80 mL via INTRAVENOUS

## 2013-03-17 NOTE — Telephone Encounter (Signed)
Called patient's insurance carrier - [employer] - for prior authorization for Carafate suspension.  Authorization is in progress.

## 2013-03-19 ENCOUNTER — Ambulatory Visit (HOSPITAL_BASED_OUTPATIENT_CLINIC_OR_DEPARTMENT_OTHER): Payer: Medicare HMO | Admitting: Internal Medicine

## 2013-03-19 ENCOUNTER — Encounter: Payer: Self-pay | Admitting: Internal Medicine

## 2013-03-19 ENCOUNTER — Telehealth: Payer: Self-pay | Admitting: Internal Medicine

## 2013-03-19 ENCOUNTER — Telehealth: Payer: Self-pay | Admitting: *Deleted

## 2013-03-19 VITALS — BP 130/78 | HR 88 | Temp 97.0°F | Resp 19 | Ht 73.0 in | Wt 194.2 lb

## 2013-03-19 DIAGNOSIS — C3430 Malignant neoplasm of lower lobe, unspecified bronchus or lung: Secondary | ICD-10-CM

## 2013-03-19 DIAGNOSIS — R5383 Other fatigue: Secondary | ICD-10-CM

## 2013-03-19 DIAGNOSIS — C3490 Malignant neoplasm of unspecified part of unspecified bronchus or lung: Secondary | ICD-10-CM

## 2013-03-19 DIAGNOSIS — R1319 Other dysphagia: Secondary | ICD-10-CM

## 2013-03-19 NOTE — Telephone Encounter (Signed)
Per staff message and POF I have scheduled appts.  JMW  

## 2013-03-19 NOTE — Telephone Encounter (Signed)
Gave pt appt for lab,md and chemo for  MArch and April 2015

## 2013-03-19 NOTE — Progress Notes (Signed)
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   Primary site: Lung (Right)  Staging method: AJCC 7th Edition  Clinical: Stage IIIA (T2b, N2, M0)  Summary: Stage IIIA (T2b, N2, M0)   PRIOR THERAPY: Concurrent chemoradiation with weekly carboplatin for an AUC of 2 and paclitaxel 45 mg/m2, status post 4 cycles.    CURRENT THERAPY: Consolidation chemotherapy with carboplatin for AUC of 5 and paclitaxel 175 mg/M2 every 3 weeks with Neulasta support, first cycle on 03/23/2013.  DISEASE STAGE:  Lung cancer  Primary site: Lung (Right)  Staging method: AJCC 7th Edition  Clinical: Stage IIIA (T2b, N2, M0)  Summary: Stage IIIA (T2b, N2, M0)  CHEMOTHERAPY INTENT: Control/Palliative  CURRENT # OF CHEMOTHERAPY CYCLES: 1 CURRENT ANTIEMETICS: Zofran, dexamethasone, Compazine  CURRENT SMOKING STATUS: Former smoker, quit 01/09/2007  ORAL CHEMOTHERAPY AND CONSENT: n/a  CURRENT BISPHOSPHONATES USE: none  PAIN MANAGEMENT: Ibuprofen  NARCOTICS INDUCED CONSTIPATION: None  LIVING WILL AND CODE STATUS: ?  INTERVAL HISTORY: Steve [name] 73 y.o. male returns to the clinic today for followup visit accompanied by his wife. The patient is feeling fine today with no specific complaints. He tolerated this course of concurrent chemoradiation fairly well except for odynophagia which is slowly improving. He denied having any significant shortness of breath, cough or hemoptysis. He denied having any weight loss or night sweats. The patient has no fever or chills and no nausea or vomiting. He had repeat CT scan of the chest performed recently and he see for evaluation and discussion of his scan results.  MEDICAL HISTORY: Past Medical History    Diagnosis Date  . Dizziness and giddiness     positional vertigo  . Malignant neoplasm of bladder, part unspecified   . Hyperlipidemia   . Impotence of organic origin   . Osteoarthritis, shoulder   . Allergy   . Hypothyroidism     pt denies thryoid disease, dr pandey note 11-19-12 says subclinical hypothryoid in epic  . COPD (chronic obstructive pulmonary disease)   . Diabetes mellitus without complication     pt denies diabetes, dr pandey note says dm 11-19-12 epic    ALLERGIES:  is allergic to morphine and related.  MEDICATIONS:  Current Outpatient Prescriptions  Medication Sig Dispense Refill  . aspirin 325 MG tablet Take 325 mg by mouth daily.      Marland Kitchen diltiazem (CARDIZEM CD) 180 MG 24 hr capsule Take 1 capsule (180 mg total) by mouth daily.  90 capsule  3  . fluconazole (DIFLUCAN) 100 MG tablet Take 1 tablet (100 mg total) by mouth daily.  8 tablet  0  . ibuprofen (ADVIL,MOTRIN) 200 MG tablet Take 200 mg by mouth every 6 (six) hours as needed.      Marland Kitchen omeprazole (PRILOSEC) 20 MG capsule Take 20 mg by mouth daily. Disp. 30 tablets, 0 refills      . ranitidine (ZANTAC) 150 MG tablet Take 150 mg by mouth at bedtime.      . simvastatin (ZOCOR) 20 MG tablet Take one tablet by mouth once daily to lower cholesterol  90 tablet  3  . sucralfate (CARAFATE) 1 GM/10ML suspension Take 10 mLs (1 g total) by mouth 4 (four) times daily -  with meals and  at bedtime. Refill from original script written on 01/26/13.  1 refill on 02/16/13 script. Dr. Nelda Severe Kinard  420 mL  2  . temazepam (RESTORIL) 15 MG capsule Take 1 capsule (15 mg total) by mouth at bedtime as needed for sleep.  30 capsule  0  . Alum & Mag Hydroxide-Simeth (MAGIC MOUTHWASH) SOLN Take 10 mLs by mouth 4 (four) times daily. Alternate with the Carafate Suspension. 1 refill       No current facility-administered medications for this visit.    SURGICAL HISTORY:  Past Surgical History  Procedure Laterality Date  . Cystoscopy w/  dilation of bladder    . Tonsillectomy  age 58  . Endobronchial ultrasound Bilateral 12/15/2012    Procedure: ENDOBRONCHIAL ULTRASOUND;  Surgeon: Brand Males, MD;  Location: [facility];  Service: Cardiopulmonary;  Laterality: Bilateral;    REVIEW OF SYSTEMS:  Constitutional: negative Eyes: negative Ears, nose, mouth, throat, and face: negative Respiratory: negative Cardiovascular: negative Gastrointestinal: positive for odynophagia Genitourinary:negative Integument/breast: negative Hematologic/lymphatic: negative Musculoskeletal:negative Neurological: negative Behavioral/Psych: negative Endocrine: negative Allergic/Immunologic: negative   PHYSICAL EXAMINATION: General appearance: alert, cooperative and no distress Head: Normocephalic, without obvious abnormality, atraumatic Neck: no adenopathy, no JVD, supple, symmetrical, trachea midline and thyroid not enlarged, symmetric, no tenderness/mass/nodules Lymph nodes: Cervical, supraclavicular, and axillary nodes normal. Resp: clear to auscultation bilaterally Back: symmetric, no curvature. ROM normal. No CVA tenderness. Cardio: regular rate and rhythm, S1, S2 normal, no murmur, click, rub or gallop GI: soft, non-tender; bowel sounds normal; no masses,  no organomegaly Extremities: extremities normal, atraumatic, no cyanosis or edema Neurologic: Alert and oriented X 3, normal strength and tone. Normal symmetric reflexes. Normal coordination and gait  ECOG PERFORMANCE STATUS: 1 - Symptomatic but completely ambulatory  Blood pressure 130/78, pulse 88, temperature 97 F (36.1 C), temperature source Oral, resp. rate 19, height 6\' 1"  (1.854 m), weight 194 lb 3.2 oz (88.089 kg).  LABORATORY DATA: Lab Results  Component Value Date   WBC 6.1 03/17/2013   HGB 10.6* 03/17/2013   HCT 32.1* 03/17/2013   MCV 91.6 03/17/2013   PLT 510* 03/17/2013      Chemistry      Component Value Date/Time   NA 143 03/17/2013 1400   NA 143  11/17/2012 0805   K 3.8 03/17/2013 1400   K 4.8 11/17/2012 0805   CL 103 11/17/2012 0805   CO2 26 03/17/2013 1400   CO2 26 11/17/2012 0805   BUN 12.8 03/17/2013 1400   BUN 13 11/17/2012 0805   CREATININE 0.8 03/17/2013 1400   CREATININE 0.95 11/17/2012 0805      Component Value Date/Time   CALCIUM 9.3 03/17/2013 1400   CALCIUM 10.0 11/17/2012 0805   ALKPHOS 70 03/17/2013 1400   ALKPHOS 82 05/09/2012 0854   AST 19 03/17/2013 1400   AST 26 05/09/2012 0854   ALT 14 03/17/2013 1400   ALT 39 05/09/2012 0854   BILITOT 0.37 03/17/2013 1400   BILITOT 0.4 05/09/2012 0854       RADIOGRAPHIC STUDIES: Ct Chest W Contrast  03/17/2013   CLINICAL DATA Lung cancer, staging.  Prior chemotherapy and radiation.  EXAM CT CHEST WITH CONTRAST  TECHNIQUE Multidetector CT imaging of the chest was performed during intravenous contrast administration.  CONTRAST 24mL OMNIPAQUE IOHEXOL 300 MG/ML  SOLN  COMPARISON NM PET IMAGE INITIAL (PI) SKULL BASE TO THIGH dated 12/03/2012; CT CHEST W/CM dated 11/24/2012; DG CHEST 2 VIEW dated 11/19/2012  FINDINGS Right hilar/ infrahilar mass has decreased in size  since prior study. This is difficult to measure but measures approximately 3.5 x 3.3 cm compared with 6.2 x 4.7 cm previously. This is measured on image 40 of series 2. Subcarinal adenopathy measures 3.5 x 1.9 cm compared with 4.7 x 2.9 cm previously. Right paratracheal node on image 17 measures 9 mm, compared with 8 mm previously, not significantly changed. This is measured on image 35. Improved aeration in the right lower lobe. There continues to be mild to moderate volume loss and atelectasis in the right lower lobe.  Clustered nodular ground-glass densities posteriorly in the in right upper lobe on images 29 and 30, likely inflammatory, but recommend attention on followup imaging. Left lung is clear. No pleural effusions.  Dense coronary artery, mitral and aortic valve calcifications. Heart is normal size. Aorta is normal caliber. No  acute bony abnormality.  IMPRESSION Improving right hilar/infrahilar mass and postobstructive process in the right lower lobe. Right infrahilar mass currently measures approximately 3.5 cm in greatest diameter compared with 6.2 cm previously. Improving subcarinal adenopathy.  Small cluster of ground-glass nodular densities posteriorly in the right upper lobe. Appearance suggests inflammatory process. Recommend attention on followup imaging.  SIGNATURE  Electronically Signed   By: Rolm Baptise M.D.   On: 03/17/2013 17:03   ASSESSMENT AND PLAN: This is a very pleasant 73 years old [place] male with history of stage IIIa non-small cell lung cancer who completed a course of concurrent chemoradiation with weekly carboplatin and paclitaxel with no significant complaints except for radiation induced odynophagia.  His recent scan showed partial response but there is residual disease. I discussed the scan results and showed the images to the patient and his wife. I have a lengthy discussion with him regarding his treatment options including observation and close monitoring versus proceeding with consolidation chemotherapy with carboplatin for AUC of 5 and paclitaxel 175 mg/M2 with Neulasta support every 3 weeks. I discussed with the patient adverse effect of the chemotherapy including but not limited to alopecia, myelosuppression, nausea and vomiting, peripheral neuropathy, liver or renal dysfunction. The patient would like to proceed with the systemic chemotherapy. He is expected to start the first cycle of this treatment on 03/23/2013. He would come back for followup visit in 3 weeks with the start of cycle #2 of his chemotherapy. He was advised to call immediately if he has any concerning symptoms in the interval.  The patient voices understanding of current disease status and treatment options and is in agreement with the current care plan.  All questions were answered. The patient knows to call the clinic with  any problems, questions or concerns. We can certainly see the patient much sooner if necessary.  I spent 20 minutes counseling the patient face to face. The total time spent in the appointment was 30 minutes.  Disclaimer: This note was dictated with voice recognition software. Similar sounding words can inadvertently be transcribed and may not be corrected upon review.

## 2013-03-20 NOTE — Telephone Encounter (Signed)
No additional notes needed  

## 2013-03-23 ENCOUNTER — Ambulatory Visit (HOSPITAL_BASED_OUTPATIENT_CLINIC_OR_DEPARTMENT_OTHER): Payer: Medicare HMO

## 2013-03-23 ENCOUNTER — Other Ambulatory Visit (HOSPITAL_BASED_OUTPATIENT_CLINIC_OR_DEPARTMENT_OTHER): Payer: Medicare HMO

## 2013-03-23 VITALS — BP 122/68 | HR 80 | Temp 98.1°F | Resp 18

## 2013-03-23 DIAGNOSIS — Z5111 Encounter for antineoplastic chemotherapy: Secondary | ICD-10-CM

## 2013-03-23 DIAGNOSIS — C3430 Malignant neoplasm of lower lobe, unspecified bronchus or lung: Secondary | ICD-10-CM

## 2013-03-23 DIAGNOSIS — C3490 Malignant neoplasm of unspecified part of unspecified bronchus or lung: Secondary | ICD-10-CM

## 2013-03-23 DIAGNOSIS — E039 Hypothyroidism, unspecified: Secondary | ICD-10-CM

## 2013-03-23 DIAGNOSIS — R5383 Other fatigue: Secondary | ICD-10-CM

## 2013-03-23 LAB — COMPREHENSIVE METABOLIC PANEL (CC13)
ALK PHOS: 85 U/L (ref 40–150)
ALT: 15 U/L (ref 0–55)
AST: 17 U/L (ref 5–34)
Albumin: 3.4 g/dL — ABNORMAL LOW (ref 3.5–5.0)
Anion Gap: 13 mEq/L — ABNORMAL HIGH (ref 3–11)
BUN: 13.9 mg/dL (ref 7.0–26.0)
CO2: 24 mEq/L (ref 22–29)
Calcium: 9.5 mg/dL (ref 8.4–10.4)
Chloride: 109 mEq/L (ref 98–109)
Creatinine: 0.8 mg/dL (ref 0.7–1.3)
Glucose: 87 mg/dl (ref 70–140)
Potassium: 3.4 mEq/L — ABNORMAL LOW (ref 3.5–5.1)
Sodium: 146 mEq/L — ABNORMAL HIGH (ref 136–145)
Total Bilirubin: 0.32 mg/dL (ref 0.20–1.20)
Total Protein: 7.3 g/dL (ref 6.4–8.3)

## 2013-03-23 LAB — TSH CHCC: TSH: 2.278 m(IU)/L (ref 0.320–4.118)

## 2013-03-23 LAB — CBC WITH DIFFERENTIAL/PLATELET
BASO%: 0.6 % (ref 0.0–2.0)
Basophils Absolute: 0 10*3/uL (ref 0.0–0.1)
EOS%: 2.5 % (ref 0.0–7.0)
Eosinophils Absolute: 0.2 10*3/uL (ref 0.0–0.5)
HCT: 34.7 % — ABNORMAL LOW (ref 38.4–49.9)
HGB: 11.4 g/dL — ABNORMAL LOW (ref 13.0–17.1)
LYMPH%: 25.6 % (ref 14.0–49.0)
MCH: 30.6 pg (ref 27.2–33.4)
MCHC: 32.9 g/dL (ref 32.0–36.0)
MCV: 93 fL (ref 79.3–98.0)
MONO#: 0.9 10*3/uL (ref 0.1–0.9)
MONO%: 13.5 % (ref 0.0–14.0)
NEUT#: 3.8 10*3/uL (ref 1.5–6.5)
NEUT%: 57.8 % (ref 39.0–75.0)
NRBC: 0 % (ref 0–0)
PLATELETS: 456 10*3/uL — AB (ref 140–400)
RBC: 3.73 10*6/uL — AB (ref 4.20–5.82)
RDW: 17.3 % — ABNORMAL HIGH (ref 11.0–14.6)
WBC: 6.5 10*3/uL (ref 4.0–10.3)
lymph#: 1.7 10*3/uL (ref 0.9–3.3)

## 2013-03-23 MED ORDER — FAMOTIDINE IN NACL 20-0.9 MG/50ML-% IV SOLN
INTRAVENOUS | Status: AC
Start: 2013-03-23 — End: 2013-03-23
  Filled 2013-03-23: qty 50

## 2013-03-23 MED ORDER — DEXAMETHASONE SODIUM PHOSPHATE 20 MG/5ML IJ SOLN
INTRAMUSCULAR | Status: AC
  Filled 2013-03-23: qty 5

## 2013-03-23 MED ORDER — DEXAMETHASONE SODIUM PHOSPHATE 20 MG/5ML IJ SOLN
20.0000 mg | Freq: Once | INTRAMUSCULAR | Status: AC
  Administered 2013-03-23: 20 mg via INTRAVENOUS

## 2013-03-23 MED ORDER — SODIUM CHLORIDE 0.9 % IV SOLN
Freq: Once | INTRAVENOUS | Status: AC
  Administered 2013-03-23: 15:00:00 via INTRAVENOUS

## 2013-03-23 MED ORDER — ONDANSETRON 16 MG/50ML IVPB (CHCC)
16.0000 mg | Freq: Once | INTRAVENOUS | Status: AC
Start: 2013-03-23 — End: 2013-03-23
  Administered 2013-03-23: 16 mg via INTRAVENOUS

## 2013-03-23 MED ORDER — DIPHENHYDRAMINE HCL 50 MG/ML IJ SOLN
50.0000 mg | Freq: Once | INTRAMUSCULAR | Status: AC
  Administered 2013-03-23: 50 mg via INTRAVENOUS

## 2013-03-23 MED ORDER — PACLITAXEL CHEMO INJECTION 300 MG/50ML
175.0000 mg/m2 | Freq: Once | INTRAVENOUS | Status: AC
  Administered 2013-03-23: 372 mg via INTRAVENOUS
  Filled 2013-03-23: qty 62

## 2013-03-23 MED ORDER — ONDANSETRON 16 MG/50ML IVPB (CHCC)
INTRAVENOUS | Status: AC
  Filled 2013-03-23: qty 16

## 2013-03-23 MED ORDER — FAMOTIDINE IN NACL 20-0.9 MG/50ML-% IV SOLN
20.0000 mg | Freq: Once | INTRAVENOUS | Status: AC
  Administered 2013-03-23: 20 mg via INTRAVENOUS

## 2013-03-23 MED ORDER — SODIUM CHLORIDE 0.9 % IV SOLN
547.0000 mg | Freq: Once | INTRAVENOUS | Status: AC
  Administered 2013-03-23: 550 mg via INTRAVENOUS
  Filled 2013-03-23: qty 55

## 2013-03-23 NOTE — Progress Notes (Signed)
Late entry for DOS 02/27/2013.  Potassium infusion began at 1316hr, pt. Was discharged at 1645hr.  Infusion given 3hr, and 20 mins. HL

## 2013-03-23 NOTE — Progress Notes (Signed)
Late entry for DOS 02/26/2013. Per discharge note, pt.d/c at 1701hr, K+ infusion started at 1347hr, should be completed at 1654 as IV was documented as removed. HL

## 2013-03-23 NOTE — Patient Instructions (Signed)
[facility] Discharge Instructions for Patients Receiving Chemotherapy  Today you received the following chemotherapy agents Taxol, Carbo.   To help prevent nausea and vomiting after your treatment, we encourage you to take your nausea medication as prescribed.    If you develop nausea and vomiting that is not controlled by your nausea medication, call the clinic.   BELOW ARE SYMPTOMS THAT SHOULD BE REPORTED IMMEDIATELY:  *FEVER GREATER THAN 100.5 F  *CHILLS WITH OR WITHOUT FEVER  NAUSEA AND VOMITING THAT IS NOT CONTROLLED WITH YOUR NAUSEA MEDICATION  *UNUSUAL SHORTNESS OF BREATH  *UNUSUAL BRUISING OR BLEEDING  TENDERNESS IN MOUTH AND THROAT WITH OR WITHOUT PRESENCE OF ULCERS  *URINARY PROBLEMS  *BOWEL PROBLEMS  UNUSUAL RASH Items with * indicate a potential emergency and should be followed up as soon as possible.  Feel free to call the clinic should you have any questions or concerns. The clinic phone number is [phone].  It was my pleasure to serve you today! Tina Jayel Scaduto,RN

## 2013-03-24 ENCOUNTER — Telehealth: Payer: Self-pay | Admitting: *Deleted

## 2013-03-24 ENCOUNTER — Ambulatory Visit (HOSPITAL_BASED_OUTPATIENT_CLINIC_OR_DEPARTMENT_OTHER): Payer: Medicare HMO

## 2013-03-24 VITALS — BP 125/65 | HR 91 | Temp 97.8°F

## 2013-03-24 DIAGNOSIS — Z5189 Encounter for other specified aftercare: Secondary | ICD-10-CM

## 2013-03-24 DIAGNOSIS — C3490 Malignant neoplasm of unspecified part of unspecified bronchus or lung: Secondary | ICD-10-CM

## 2013-03-24 DIAGNOSIS — C3430 Malignant neoplasm of lower lobe, unspecified bronchus or lung: Secondary | ICD-10-CM

## 2013-03-24 MED ORDER — PEGFILGRASTIM INJECTION 6 MG/0.6ML
6.0000 mg | Freq: Once | SUBCUTANEOUS | Status: AC
  Administered 2013-03-24: 6 mg via SUBCUTANEOUS
  Filled 2013-03-24: qty 0.6

## 2013-03-24 NOTE — Telephone Encounter (Signed)
Steve [name] here for Neulasta injection following 1st taxol/carbo chemotherapy.  States that he is doing OK except for being tired and a little nauseated.  States that he didn't sleep last night.  Encouraged him to take his sleeping meds.  He did not take his antiemetics because he wasn't too bad.  Encouraged him to use the medicines the MD gave him so he would start to feel better.  He is drinking lots of fluids.  All questions answered.  Knows to call if he has any problems or concerns.

## 2013-03-24 NOTE — Patient Instructions (Signed)
Pegfilgrastim injection  What is this medicine?  PEGFILGRASTIM (peg fil GRA stim) helps the body make more [place] blood cells. It is used to prevent infection in people with low amounts of Val[place] blood cells following cancer treatment.  This medicine may be used for other purposes; ask your health care provider or pharmacist if you have questions.  COMMON BRAND NAME(S): Neulasta  What should I tell my health care provider before I take this medicine?  They need to know if you have any of these conditions:  -sickle cell disease  -an unusual or allergic reaction to pegfilgrastim, filgrastim, E.coli protein, other medicines, foods, dyes, or preservatives  -pregnant or trying to get pregnant  -breast-feeding  How should I use this medicine?  This medicine is for injection under the skin. It is usually given by a health care professional in a hospital or clinic setting.  If you get this medicine at home, you will be taught how to prepare and give this medicine. Do not shake this medicine. Use exactly as directed. Take your medicine at regular intervals. Do not take your medicine more often than directed.  It is important that you put your used needles and syringes in a special sharps container. Do not put them in a trash can. If you do not have a sharps container, call your pharmacist or healthcare provider to get one.  Talk to your pediatrician regarding the use of this medicine in children. While this drug may be prescribed for children who weigh more than 45 kg for selected conditions, precautions do apply  Overdosage: If you think you have taken too much of this medicine contact a poison control center or emergency room at once.  NOTE: This medicine is only for you. Do not share this medicine with others.  What if I miss a dose?  If you miss a dose, take it as soon as you can. If it is almost time for your next dose, take only that dose. Do not take double or extra doses.  What may interact with this  medicine?  -lithium  -medicines for growth therapy  This list may not describe all possible interactions. Give your health care provider a list of all the medicines, herbs, non-prescription drugs, or dietary supplements you use. Also tell them if you smoke, drink alcohol, or use illegal drugs. Some items may interact with your medicine.  What should I watch for while using this medicine?  Visit your doctor for regular check ups. You will need important blood work done while you are taking this medicine.  What side effects may I notice from receiving this medicine?  Side effects that you should report to your doctor or health care professional as soon as possible:  -allergic reactions like skin rash, itching or hives, swelling of the face, lips, or tongue  -breathing problems  -fever  -pain, redness, or swelling where injected  -shoulder pain  -stomach or side pain  Side effects that usually do not require medical attention (report to your doctor or health care professional if they continue or are bothersome):  -aches, pains  -headache  -loss of appetite  -nausea, vomiting  -unusually tired  This list may not describe all possible side effects. Call your doctor for medical advice about side effects. You may report side effects to FDA at 1-800-FDA-1088.  Where should I keep my medicine?  Keep out of the reach of children.  Store in a refrigerator between 2 and 8 degrees C (36 and   46 degrees F). Do not freeze. Keep in carton to protect from light. Throw away this medicine if it is left out of the refrigerator for more than 48 hours. Throw away any unused medicine after the expiration date.  NOTE: This sheet is a summary. It may not cover all possible information. If you have questions about this medicine, talk to your doctor, pharmacist, or health care provider.   2014, Elsevier/Gold Standard. (2007-07-28 15:41:44)

## 2013-03-30 ENCOUNTER — Ambulatory Visit: Payer: Medicare HMO

## 2013-03-30 ENCOUNTER — Other Ambulatory Visit (HOSPITAL_BASED_OUTPATIENT_CLINIC_OR_DEPARTMENT_OTHER): Payer: Medicare HMO

## 2013-03-30 DIAGNOSIS — C3490 Malignant neoplasm of unspecified part of unspecified bronchus or lung: Secondary | ICD-10-CM

## 2013-03-30 DIAGNOSIS — C3430 Malignant neoplasm of lower lobe, unspecified bronchus or lung: Secondary | ICD-10-CM

## 2013-03-30 LAB — COMPREHENSIVE METABOLIC PANEL (CC13)
ALBUMIN: 3.4 g/dL — AB (ref 3.5–5.0)
ALT: 17 U/L (ref 0–55)
AST: 16 U/L (ref 5–34)
Alkaline Phosphatase: 97 U/L (ref 40–150)
Anion Gap: 8 mEq/L (ref 3–11)
BUN: 9.5 mg/dL (ref 7.0–26.0)
CHLORIDE: 108 meq/L (ref 98–109)
CO2: 26 mEq/L (ref 22–29)
Calcium: 9.4 mg/dL (ref 8.4–10.4)
Creatinine: 0.8 mg/dL (ref 0.7–1.3)
GLUCOSE: 101 mg/dL (ref 70–140)
POTASSIUM: 4.1 meq/L (ref 3.5–5.1)
Sodium: 142 mEq/L (ref 136–145)
TOTAL PROTEIN: 6.7 g/dL (ref 6.4–8.3)
Total Bilirubin: 0.29 mg/dL (ref 0.20–1.20)

## 2013-03-30 LAB — CBC WITH DIFFERENTIAL/PLATELET
BASO%: 1.1 % (ref 0.0–2.0)
Basophils Absolute: 0.1 10*3/uL (ref 0.0–0.1)
EOS ABS: 0.4 10*3/uL (ref 0.0–0.5)
EOS%: 5.7 % (ref 0.0–7.0)
HCT: 32.8 % — ABNORMAL LOW (ref 38.4–49.9)
HGB: 10.8 g/dL — ABNORMAL LOW (ref 13.0–17.1)
LYMPH#: 1.2 10*3/uL (ref 0.9–3.3)
LYMPH%: 19.1 % (ref 14.0–49.0)
MCH: 30.6 pg (ref 27.2–33.4)
MCHC: 32.9 g/dL (ref 32.0–36.0)
MCV: 92.9 fL (ref 79.3–98.0)
MONO#: 1.7 10*3/uL — AB (ref 0.1–0.9)
MONO%: 26.7 % — ABNORMAL HIGH (ref 0.0–14.0)
NEUT#: 3.1 10*3/uL (ref 1.5–6.5)
NEUT%: 47.4 % (ref 39.0–75.0)
PLATELETS: 272 10*3/uL (ref 140–400)
RBC: 3.53 10*6/uL — AB (ref 4.20–5.82)
RDW: 16.6 % — ABNORMAL HIGH (ref 11.0–14.6)
WBC: 6.4 10*3/uL (ref 4.0–10.3)

## 2013-04-03 ENCOUNTER — Ambulatory Visit (INDEPENDENT_AMBULATORY_CARE_PROVIDER_SITE_OTHER): Payer: Medicare HMO | Admitting: Internal Medicine

## 2013-04-03 ENCOUNTER — Encounter: Payer: Self-pay | Admitting: Internal Medicine

## 2013-04-03 VITALS — BP 112/64 | HR 96 | Ht 73.0 in | Wt 198.0 lb

## 2013-04-03 DIAGNOSIS — I4891 Unspecified atrial fibrillation: Secondary | ICD-10-CM

## 2013-04-03 DIAGNOSIS — E138 Other specified diabetes mellitus with unspecified complications: Principal | ICD-10-CM

## 2013-04-03 DIAGNOSIS — E1365 Other specified diabetes mellitus with hyperglycemia: Secondary | ICD-10-CM

## 2013-04-03 DIAGNOSIS — IMO0002 Reserved for concepts with insufficient information to code with codable children: Secondary | ICD-10-CM

## 2013-04-03 NOTE — Progress Notes (Signed)
HPI Patient is a 73 yo who I saw fro the first time in Jan  He has a history of intermitt atrial fib. When I saw him I recomm a holter  This showed SR/SB and afib  Average HR was 90   Echo was also done that was normal Since seen he is doing much better from standpoint of esophagus  XRT is done  He is now on chemo Denies CP  No SOB  No palpitations  No dizziness  No N/V  No GI bleeding     Allergies  Allergen Reactions  . Morphine And Related     Current Outpatient Prescriptions  Medication Sig Dispense Refill  . aspirin 325 MG tablet Take 325 mg by mouth daily.      Marland Kitchen diltiazem (CARDIZEM CD) 180 MG 24 hr capsule Take 1 capsule (180 mg total) by mouth daily.  90 capsule  3  . ibuprofen (ADVIL,MOTRIN) 200 MG tablet Take 200 mg by mouth every 6 (six) hours as needed.      Marland Kitchen omeprazole (PRILOSEC) 20 MG capsule Take 20 mg by mouth daily. Disp. 30 tablets, 0 refills      . ranitidine (ZANTAC) 150 MG tablet Take 150 mg by mouth at bedtime.      . simvastatin (ZOCOR) 20 MG tablet Take one tablet by mouth once daily to lower cholesterol  90 tablet  3  . sucralfate (CARAFATE) 1 GM/10ML suspension Take 10 mLs (1 g total) by mouth 4 (four) times daily -  with meals and at bedtime. Refill from original script written on 01/26/13.  1 refill on 02/16/13 script. Dr. Nelda Severe Kinard  420 mL  2  . temazepam (RESTORIL) 15 MG capsule Take 1 capsule (15 mg total) by mouth at bedtime as needed for sleep.  30 capsule  0   No current facility-administered medications for this visit.    Past Medical History  Diagnosis Date  . Dizziness and giddiness     positional vertigo  . Malignant neoplasm of bladder, part unspecified   . Hyperlipidemia   . Impotence of organic origin   . Osteoarthritis, shoulder   . Allergy   . Hypothyroidism     pt denies thryoid disease, dr pandey note 11-19-12 says subclinical hypothryoid in epic  . COPD (chronic obstructive pulmonary disease)   . Diabetes mellitus without  complication     pt denies diabetes, dr pandey note says dm 11-19-12 epic    Past Surgical History  Procedure Laterality Date  . Cystoscopy w/ dilation of bladder    . Tonsillectomy  age 30  . Endobronchial ultrasound Bilateral 12/15/2012    Procedure: ENDOBRONCHIAL ULTRASOUND;  Surgeon: Brand Males, MD;  Location: [facility];  Service: Cardiopulmonary;  Laterality: Bilateral;    Family History  Problem Relation Age of Onset  . Kidney disease Brother     History   Social History  . Marital Status: Married    Spouse Name: N/A    Number of Children: N/A  . Years of Education: N/A   Occupational History  . business owner    Social History Main Topics  . Smoking status: Former Smoker -- 1.50 packs/day for 50 years    Types: Cigarettes    Quit date: 01/09/2007  . Smokeless tobacco: Current User    Types: Chew  . Alcohol Use: Yes     Comment: 4-5 beers a month, 1 mixed drink every 2 months  . Drug Use: No  . Sexual Activity:  Not on file   Other Topics Concern  . Not on file   Social History Narrative  . No narrative on file    Review of Systems:  All systems reviewed.  They are negative to the above problem except as previously stated.  Vital Signs: BP 112/64  Pulse 96  Ht 6\' 1"  (1.854 m)  Wt 198 lb (89.812 kg)  BMI 26.13 kg/m2  SpO2 97%  Physical Exam Patient is in NAD HEENT:  Normocephalic, atraumatic. EOMI, PERRLA.  Neck: JVP is normal.  No bruits.  Lungs: clear to auscultation. No rales no wheezes.  Heart: Irregular rate and rhythm. Normal S1, S2. No S3.   No significant murmurs. PMI not displaced.  Abdomen:  Supple, nontender. Normal bowel sounds. No masses. No hepatomegaly.  Extremities:   Good distal pulses throughout. No lower extremity edema.  Musculoskeletal :moving all extremities.  Neuro:   alert and oriented x3.  CN II-XII grossly intact.  EKG  1/13:  Atrial fibrillation  132 bpm  Nonspecific ST T wave changes    Assessment and  Plan:  Impresion: Doing better  Esophagus healed back on chemo  Does have some neuropathic complaints Denies palpitaton  1.  Atrial fib  Keep on same regimen  Will check BMET and HgbA1C  For DM If still high will need to think about having treated and also review anticoagulation (in setting of chemo) For now keep on ASA and dilt.    WIll call patient once I have reviewed  Will forward to cancer center  Has appt with Dr Julien Nordmann in APril

## 2013-04-03 NOTE — Patient Instructions (Signed)
Your physician wants you to follow-up in: [place] will receive a reminder letter in the mail two months in advance. If you don't receive a letter, please call our office to schedule the follow-up appointment.   Your physician recommends that you return for lab work in: BMP=HGBA1C

## 2013-04-06 ENCOUNTER — Ambulatory Visit: Payer: Medicare HMO

## 2013-04-06 ENCOUNTER — Other Ambulatory Visit: Payer: Self-pay | Admitting: *Deleted

## 2013-04-06 ENCOUNTER — Other Ambulatory Visit (HOSPITAL_BASED_OUTPATIENT_CLINIC_OR_DEPARTMENT_OTHER): Payer: Medicare HMO

## 2013-04-06 ENCOUNTER — Telehealth: Payer: Self-pay | Admitting: Internal Medicine

## 2013-04-06 ENCOUNTER — Encounter: Payer: Self-pay | Admitting: Internal Medicine

## 2013-04-06 DIAGNOSIS — C3490 Malignant neoplasm of unspecified part of unspecified bronchus or lung: Secondary | ICD-10-CM

## 2013-04-06 DIAGNOSIS — C3430 Malignant neoplasm of lower lobe, unspecified bronchus or lung: Secondary | ICD-10-CM

## 2013-04-06 LAB — CBC WITH DIFFERENTIAL/PLATELET
BASO%: 0.9 % (ref 0.0–2.0)
Basophils Absolute: 0.1 10*3/uL (ref 0.0–0.1)
EOS%: 0.6 % (ref 0.0–7.0)
Eosinophils Absolute: 0.1 10*3/uL (ref 0.0–0.5)
HCT: 34.8 % — ABNORMAL LOW (ref 38.4–49.9)
HGB: 11.6 g/dL — ABNORMAL LOW (ref 13.0–17.1)
LYMPH%: 12.9 % — ABNORMAL LOW (ref 14.0–49.0)
MCH: 31 pg (ref 27.2–33.4)
MCHC: 33.2 g/dL (ref 32.0–36.0)
MCV: 93.3 fL (ref 79.3–98.0)
MONO#: 0.8 10*3/uL (ref 0.1–0.9)
MONO%: 9.7 % (ref 0.0–14.0)
NEUT#: 6.3 10*3/uL (ref 1.5–6.5)
NEUT%: 75.9 % — ABNORMAL HIGH (ref 39.0–75.0)
Platelets: 213 10*3/uL (ref 140–400)
RBC: 3.72 10*6/uL — AB (ref 4.20–5.82)
RDW: 18.2 % — AB (ref 11.0–14.6)
WBC: 8.4 10*3/uL (ref 4.0–10.3)
lymph#: 1.1 10*3/uL (ref 0.9–3.3)

## 2013-04-06 LAB — COMPREHENSIVE METABOLIC PANEL (CC13)
ALT: 16 U/L (ref 0–55)
AST: 15 U/L (ref 5–34)
Albumin: 3.4 g/dL — ABNORMAL LOW (ref 3.5–5.0)
Alkaline Phosphatase: 105 U/L (ref 40–150)
Anion Gap: 12 mEq/L — ABNORMAL HIGH (ref 3–11)
BUN: 18.1 mg/dL (ref 7.0–26.0)
CO2: 22 mEq/L (ref 22–29)
Calcium: 9.5 mg/dL (ref 8.4–10.4)
Chloride: 110 mEq/L — ABNORMAL HIGH (ref 98–109)
Creatinine: 0.8 mg/dL (ref 0.7–1.3)
Glucose: 99 mg/dl (ref 70–140)
POTASSIUM: 4 meq/L (ref 3.5–5.1)
Sodium: 144 mEq/L (ref 136–145)
TOTAL PROTEIN: 7.1 g/dL (ref 6.4–8.3)
Total Bilirubin: 0.33 mg/dL (ref 0.20–1.20)

## 2013-04-06 MED ORDER — OXYCODONE-ACETAMINOPHEN 5-325 MG PO TABS
1.0000 | ORAL_TABLET | Freq: Four times a day (QID) | ORAL | Status: DC | PRN
Start: 2013-04-06 — End: 2013-04-27

## 2013-04-06 MED ORDER — TEMAZEPAM 30 MG PO CAPS
30.0000 mg | ORAL_CAPSULE | Freq: Every evening | ORAL | Status: DC | PRN
Start: 2013-04-06 — End: 2013-05-04

## 2013-04-06 NOTE — Telephone Encounter (Signed)
PT. IS HERE FOR LAB. HE IS COMPLAINING OF NUMBNESS AT FINGERTIPS AND FEET. PT. IS NOT SLEEPING DUE TO "SORENESS IN SHOULDERS AND HIPS. HE REPORTS SEVERE PAIN AFTER HIS NEULASTA INJECTION. PT. TOOK THE "OXYCODONE" AND CLARITIN WITH LITTLE RELIEVE. PT. NEEDS A REFILL ON HIS PAIN MEDICATION. EXPLAINED TO PT. THAT THE NUMBNESS AT FINGERTIPS AND FEET IS A SIDE EFFECT OF THE CHEMOTHERAPY MEDICATION,TAXOL. INSTRUCTED PT. TO CONTINUE THE CLARITIN AND TAKE HIS PAIN MEDICATION AS DIRECTED TO KEEP HIS PAIN UNDER CONTROL WHEN HE RECEIVES HIS NEXT NEULASTA INJECTION. VERBAL ORDER AND READ BACK TO DR.MOHAMED- SEE MEDICATION LIST. NEW PRESCRIPTIONS WERE GIVEN TO PT.

## 2013-04-06 NOTE — Telephone Encounter (Signed)
Order for HbA1C was in the computer. Cancer center can see order & will draw as pt is in their lab today Horton Chin RN

## 2013-04-06 NOTE — Telephone Encounter (Signed)
New message    Patient had lab drawn today . Was  Told that HGB Alc need to be added.    Office calling asking for an order.    Fax # [phone]

## 2013-04-13 ENCOUNTER — Other Ambulatory Visit (HOSPITAL_BASED_OUTPATIENT_CLINIC_OR_DEPARTMENT_OTHER): Payer: Medicare HMO

## 2013-04-13 ENCOUNTER — Ambulatory Visit (HOSPITAL_BASED_OUTPATIENT_CLINIC_OR_DEPARTMENT_OTHER): Payer: Medicare HMO

## 2013-04-13 ENCOUNTER — Ambulatory Visit (HOSPITAL_BASED_OUTPATIENT_CLINIC_OR_DEPARTMENT_OTHER): Payer: Medicare HMO | Admitting: Internal Medicine

## 2013-04-13 ENCOUNTER — Encounter: Payer: Self-pay | Admitting: Internal Medicine

## 2013-04-13 ENCOUNTER — Other Ambulatory Visit: Payer: Self-pay | Admitting: Emergency Medicine

## 2013-04-13 VITALS — BP 123/76 | HR 95 | Temp 97.8°F | Resp 18 | Ht 73.0 in | Wt 198.8 lb

## 2013-04-13 DIAGNOSIS — C3410 Malignant neoplasm of upper lobe, unspecified bronchus or lung: Secondary | ICD-10-CM

## 2013-04-13 DIAGNOSIS — C3490 Malignant neoplasm of unspecified part of unspecified bronchus or lung: Secondary | ICD-10-CM

## 2013-04-13 DIAGNOSIS — Z5111 Encounter for antineoplastic chemotherapy: Secondary | ICD-10-CM

## 2013-04-13 DIAGNOSIS — Z8551 Personal history of malignant neoplasm of bladder: Secondary | ICD-10-CM

## 2013-04-13 DIAGNOSIS — G609 Hereditary and idiopathic neuropathy, unspecified: Secondary | ICD-10-CM

## 2013-04-13 LAB — CBC WITH DIFFERENTIAL/PLATELET
BASO%: 0.5 % (ref 0.0–2.0)
Basophils Absolute: 0 10*3/uL (ref 0.0–0.1)
EOS ABS: 0 10*3/uL (ref 0.0–0.5)
EOS%: 0.5 % (ref 0.0–7.0)
HCT: 33 % — ABNORMAL LOW (ref 38.4–49.9)
HGB: 11 g/dL — ABNORMAL LOW (ref 13.0–17.1)
LYMPH%: 10.6 % — AB (ref 14.0–49.0)
MCH: 31.6 pg (ref 27.2–33.4)
MCHC: 33.4 g/dL (ref 32.0–36.0)
MCV: 94.6 fL (ref 79.3–98.0)
MONO#: 1 10*3/uL — ABNORMAL HIGH (ref 0.1–0.9)
MONO%: 13 % (ref 0.0–14.0)
NEUT%: 75.4 % — AB (ref 39.0–75.0)
NEUTROS ABS: 5.5 10*3/uL (ref 1.5–6.5)
PLATELETS: 345 10*3/uL (ref 140–400)
RBC: 3.48 10*6/uL — AB (ref 4.20–5.82)
RDW: 18.5 % — ABNORMAL HIGH (ref 11.0–14.6)
WBC: 7.3 10*3/uL (ref 4.0–10.3)
lymph#: 0.8 10*3/uL — ABNORMAL LOW (ref 0.9–3.3)

## 2013-04-13 LAB — COMPREHENSIVE METABOLIC PANEL (CC13)
ALT: 14 U/L (ref 0–55)
ANION GAP: 11 meq/L (ref 3–11)
AST: 15 U/L (ref 5–34)
Albumin: 3.5 g/dL (ref 3.5–5.0)
Alkaline Phosphatase: 87 U/L (ref 40–150)
BILIRUBIN TOTAL: 0.35 mg/dL (ref 0.20–1.20)
BUN: 12 mg/dL (ref 7.0–26.0)
CO2: 23 meq/L (ref 22–29)
CREATININE: 0.8 mg/dL (ref 0.7–1.3)
Calcium: 9.8 mg/dL (ref 8.4–10.4)
Chloride: 108 mEq/L (ref 98–109)
GLUCOSE: 145 mg/dL — AB (ref 70–140)
Potassium: 4 mEq/L (ref 3.5–5.1)
Sodium: 142 mEq/L (ref 136–145)
Total Protein: 7.3 g/dL (ref 6.4–8.3)

## 2013-04-13 MED ORDER — DIPHENHYDRAMINE HCL 50 MG/ML IJ SOLN
50.0000 mg | Freq: Once | INTRAMUSCULAR | Status: AC
  Administered 2013-04-13: 50 mg via INTRAVENOUS

## 2013-04-13 MED ORDER — ONDANSETRON 16 MG/50ML IVPB (CHCC)
INTRAVENOUS | Status: AC
  Filled 2013-04-13: qty 16

## 2013-04-13 MED ORDER — CARBOPLATIN CHEMO INJECTION 600 MG/60ML
547.0000 mg | Freq: Once | INTRAVENOUS | Status: AC
  Administered 2013-04-13: 550 mg via INTRAVENOUS
  Filled 2013-04-13: qty 55

## 2013-04-13 MED ORDER — DIPHENHYDRAMINE HCL 50 MG/ML IJ SOLN
INTRAMUSCULAR | Status: AC
  Filled 2013-04-13: qty 1

## 2013-04-13 MED ORDER — GABAPENTIN 100 MG PO CAPS: 100.0000 mg | ORAL_CAPSULE | Freq: Three times a day (TID) | ORAL | Status: DC

## 2013-04-13 MED ORDER — DEXAMETHASONE SODIUM PHOSPHATE 20 MG/5ML IJ SOLN
INTRAMUSCULAR | Status: AC
  Filled 2013-04-13: qty 5

## 2013-04-13 MED ORDER — ONDANSETRON 16 MG/50ML IVPB (CHCC)
16.0000 mg | Freq: Once | INTRAVENOUS | Status: AC
  Administered 2013-04-13: 16 mg via INTRAVENOUS

## 2013-04-13 MED ORDER — FAMOTIDINE IN NACL 20-0.9 MG/50ML-% IV SOLN
20.0000 mg | Freq: Once | INTRAVENOUS | Status: AC
  Administered 2013-04-13: 20 mg via INTRAVENOUS

## 2013-04-13 MED ORDER — FAMOTIDINE IN NACL 20-0.9 MG/50ML-% IV SOLN
INTRAVENOUS | Status: AC
  Filled 2013-04-13: qty 50

## 2013-04-13 MED ORDER — PACLITAXEL CHEMO INJECTION 300 MG/50ML
150.0000 mg/m2 | Freq: Once | INTRAVENOUS | Status: AC
  Administered 2013-04-13: 318 mg via INTRAVENOUS
  Filled 2013-04-13: qty 53

## 2013-04-13 MED ORDER — DEXAMETHASONE SODIUM PHOSPHATE 20 MG/5ML IJ SOLN
20.0000 mg | Freq: Once | INTRAMUSCULAR | Status: AC
  Administered 2013-04-13: 20 mg via INTRAVENOUS

## 2013-04-13 MED ORDER — SODIUM CHLORIDE 0.9 % IV SOLN
Freq: Once | INTRAVENOUS | Status: AC
  Administered 2013-04-13: 12:00:00 via INTRAVENOUS

## 2013-04-13 NOTE — Patient Instructions (Signed)
[facility] Discharge Instructions for Patients Receiving Chemotherapy  Today you received the following chemotherapy agents Taxol and Carboplatin.  To help prevent nausea and vomiting after your treatment, we encourage you to take your nausea medication.   If you develop nausea and vomiting that is not controlled by your nausea medication, call the clinic.   BELOW ARE SYMPTOMS THAT SHOULD BE REPORTED IMMEDIATELY:  *FEVER GREATER THAN 100.5 F  *CHILLS WITH OR WITHOUT FEVER  NAUSEA AND VOMITING THAT IS NOT CONTROLLED WITH YOUR NAUSEA MEDICATION  *UNUSUAL SHORTNESS OF BREATH  *UNUSUAL BRUISING OR BLEEDING  TENDERNESS IN MOUTH AND THROAT WITH OR WITHOUT PRESENCE OF ULCERS  *URINARY PROBLEMS  *BOWEL PROBLEMS  UNUSUAL RASH Items with * indicate a potential emergency and should be followed up as soon as possible.  Feel free to call the clinic you have any questions or concerns. The clinic phone number is [phone].    

## 2013-04-13 NOTE — Progress Notes (Signed)
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   Primary site: Lung (Right)  Staging method: AJCC 7th Edition  Clinical: Stage IIIA (T2b, N2, M0)  Summary: Stage IIIA (T2b, N2, M0)   PRIOR THERAPY: Concurrent chemoradiation with weekly carboplatin for an AUC of 2 and paclitaxel 45 mg/m2, status post 4 cycles.    CURRENT THERAPY: Consolidation chemotherapy with carboplatin for AUC of 5 and paclitaxel 175 mg/M2 every 3 weeks with Neulasta support, first cycle on 03/23/2013. Status post 1 cycle.  DISEASE STAGE:  Lung cancer  Primary site: Lung (Right)  Staging method: AJCC 7th Edition  Clinical: Stage IIIA (T2b, N2, M0)  Summary: Stage IIIA (T2b, N2, M0)  CHEMOTHERAPY INTENT: Control/Palliative  CURRENT # OF CHEMOTHERAPY CYCLES: 1 CURRENT ANTIEMETICS: Zofran, dexamethasone, Compazine  CURRENT SMOKING STATUS: Former smoker, quit 01/09/2007  ORAL CHEMOTHERAPY AND CONSENT: n/a  CURRENT BISPHOSPHONATES USE: none  PAIN MANAGEMENT: Ibuprofen  NARCOTICS INDUCED CONSTIPATION: None  LIVING WILL AND CODE STATUS: ?  INTERVAL HISTORY: Steve [name] 73 y.o. male returns to the clinic today for followup visit. 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 He tolerated the first cycle of consolidation chemotherapy fairly well except for the peripheral neuropathy and pain after the Neulasta injection. He denied having any significant shortness of breath, cough or hemoptysis. He denied having any weight loss or night sweats. The patient has no fever or chills and no nausea or vomiting.   MEDICAL HISTORY: Past Medical History  Diagnosis Date  . Dizziness and  giddiness     positional vertigo  . Malignant neoplasm of bladder, part unspecified   . Hyperlipidemia   . Impotence of organic origin   . Osteoarthritis, shoulder   . Allergy   . Hypothyroidism     pt denies thryoid disease, dr pandey note 11-19-12 says subclinical hypothryoid in epic  . COPD (chronic obstructive pulmonary disease)   . Diabetes mellitus without complication     pt denies diabetes, dr pandey note says dm 11-19-12 epic    ALLERGIES:  is allergic to morphine and related.  MEDICATIONS:  Current Outpatient Prescriptions  Medication Sig Dispense Refill  . aspirin 325 MG tablet Take 325 mg by mouth daily.      Marland Kitchen diltiazem (CARDIZEM CD) 180 MG 24 hr capsule Take 1 capsule (180 mg total) by mouth daily.  90 capsule  3  . ibuprofen (ADVIL,MOTRIN) 200 MG tablet Take 200 mg by mouth every 6 (six) hours as needed.      Marland Kitchen omeprazole (PRILOSEC) 20 MG capsule Take 20 mg by mouth daily. Disp. 30 tablets, 0 refills      . oxyCODONE-acetaminophen (ROXICET) 5-325 MG per tablet Take 1 tablet by mouth every 6 (six) hours as needed for severe pain.  40 tablet  0  . ranitidine (ZANTAC) 150 MG tablet Take 150 mg by mouth at bedtime.      . simvastatin (ZOCOR) 20 MG tablet Take one tablet by mouth once daily to lower cholesterol  90 tablet  3  . temazepam (RESTORIL) 30 MG capsule Take 1 capsule (30 mg total) by mouth at bedtime as needed for sleep.  30 capsule  0  No current facility-administered medications for this visit.    SURGICAL HISTORY:  Past Surgical History  Procedure Laterality Date  . Cystoscopy w/ dilation of bladder    . Tonsillectomy  age 48  . Endobronchial ultrasound Bilateral 12/15/2012    Procedure: ENDOBRONCHIAL ULTRASOUND;  Surgeon: Brand Males, MD;  Location: [facility];  Service: Cardiopulmonary;  Laterality: Bilateral;    REVIEW OF SYSTEMS:  Constitutional: negative Eyes: negative Ears, nose, mouth, throat, and face: negative Respiratory:  negative Cardiovascular: negative Gastrointestinal: positive for odynophagia Genitourinary:negative Integument/breast: negative Hematologic/lymphatic: negative Musculoskeletal:negative Neurological: negative Behavioral/Psych: negative Endocrine: negative Allergic/Immunologic: negative   PHYSICAL EXAMINATION: General appearance: alert, cooperative and no distress Head: Normocephalic, without obvious abnormality, atraumatic Neck: no adenopathy, no JVD, supple, symmetrical, trachea midline and thyroid not enlarged, symmetric, no tenderness/mass/nodules Lymph nodes: Cervical, supraclavicular, and axillary nodes normal. Resp: clear to auscultation bilaterally Back: symmetric, no curvature. ROM normal. No CVA tenderness. Cardio: regular rate and rhythm, S1, S2 normal, no murmur, click, rub or gallop GI: soft, non-tender; bowel sounds normal; no masses,  no organomegaly Extremities: extremities normal, atraumatic, no cyanosis or edema Neurologic: Alert and oriented X 3, normal strength and tone. Normal symmetric reflexes. Normal coordination and gait  ECOG PERFORMANCE STATUS: 1 - Symptomatic but completely ambulatory  Blood pressure 123/76, pulse 95, temperature 97.8 F (36.6 C), temperature source Oral, resp. rate 18, height 6\' 1"  (1.854 m), weight 198 lb 12.8 oz (90.175 kg).  LABORATORY DATA: Lab Results  Component Value Date   WBC 7.3 04/13/2013   HGB 11.0* 04/13/2013   HCT 33.0* 04/13/2013   MCV 94.6 04/13/2013   PLT 345 04/13/2013      Chemistry      Component Value Date/Time   NA 142 04/13/2013 1007   NA 143 11/17/2012 0805   K 4.0 04/13/2013 1007   K 4.8 11/17/2012 0805   CL 103 11/17/2012 0805   CO2 23 04/13/2013 1007   CO2 26 11/17/2012 0805   BUN 12.0 04/13/2013 1007   BUN 13 11/17/2012 0805   CREATININE 0.8 04/13/2013 1007   CREATININE 0.95 11/17/2012 0805      Component Value Date/Time   CALCIUM 9.8 04/13/2013 1007   CALCIUM 10.0 11/17/2012 0805   ALKPHOS 87 04/13/2013 1007    ALKPHOS 82 05/09/2012 0854   AST 15 04/13/2013 1007   AST 26 05/09/2012 0854   ALT 14 04/13/2013 1007   ALT 39 05/09/2012 0854   BILITOT 0.35 04/13/2013 1007   BILITOT 0.4 05/09/2012 0854       RADIOGRAPHIC STUDIES:  ASSESSMENT AND PLAN: This is a very pleasant 73 years old Steve [name] male with history of stage IIIa non-small cell lung cancer who completed a course of concurrent chemoradiation with weekly carboplatin and paclitaxel with no significant complaints except for radiation induced odynophagia.  He is currently undergoing consolidation chemotherapy with carboplatin and paclitaxel is status post 1 cycle. He has fatigue and weakness after the Neulasta injection. He also complains of peripheral neuropathy mainly on the toes and a little bit up to the lower leg. I recommended for the patient to proceed with cycle #2 of his chemotherapy today but I would reduce the dose of paclitaxel to 150 mg/M2 every 3 weeks. For the peripheral neuropathy, I will start the patient on Neurontin 100 mg by mouth 3 times a day. He would come back for followup visit in 3 weeks with the start of cycle #3 of his chemotherapy.  He was advised to call immediately if  he has any concerning symptoms in the interval.  The patient voices understanding of current disease status and treatment options and is in agreement with the current care plan.  All questions were answered. The patient knows to call the clinic with any problems, questions or concerns. We can certainly see the patient much sooner if necessary.  Disclaimer: This note was dictated with voice recognition software. Similar sounding words can inadvertently be transcribed and may not be corrected upon review.

## 2013-04-13 NOTE — Addendum Note (Signed)
Addended by: Curt Bears on: 04/13/2013 11:36 AM   Modules accepted: Orders

## 2013-04-14 ENCOUNTER — Telehealth: Payer: Self-pay | Admitting: Internal Medicine

## 2013-04-14 ENCOUNTER — Ambulatory Visit (HOSPITAL_BASED_OUTPATIENT_CLINIC_OR_DEPARTMENT_OTHER): Payer: Medicare HMO

## 2013-04-14 VITALS — BP 137/71 | HR 90 | Temp 98.1°F

## 2013-04-14 DIAGNOSIS — C3410 Malignant neoplasm of upper lobe, unspecified bronchus or lung: Secondary | ICD-10-CM

## 2013-04-14 DIAGNOSIS — Z5189 Encounter for other specified aftercare: Secondary | ICD-10-CM

## 2013-04-14 DIAGNOSIS — C3490 Malignant neoplasm of unspecified part of unspecified bronchus or lung: Secondary | ICD-10-CM

## 2013-04-14 MED ORDER — PEGFILGRASTIM INJECTION 6 MG/0.6ML
6.0000 mg | Freq: Once | SUBCUTANEOUS | Status: AC
  Administered 2013-04-14: 6 mg via SUBCUTANEOUS
  Filled 2013-04-14: qty 0.6

## 2013-04-14 NOTE — Telephone Encounter (Signed)
gv and printed appt sched and avs for pt for April  °

## 2013-04-20 ENCOUNTER — Ambulatory Visit: Payer: Medicare HMO

## 2013-04-20 ENCOUNTER — Other Ambulatory Visit (HOSPITAL_BASED_OUTPATIENT_CLINIC_OR_DEPARTMENT_OTHER): Payer: Medicare HMO

## 2013-04-20 DIAGNOSIS — C3430 Malignant neoplasm of lower lobe, unspecified bronchus or lung: Secondary | ICD-10-CM

## 2013-04-20 DIAGNOSIS — C3490 Malignant neoplasm of unspecified part of unspecified bronchus or lung: Secondary | ICD-10-CM

## 2013-04-20 LAB — COMPREHENSIVE METABOLIC PANEL (CC13)
ALK PHOS: 133 U/L (ref 40–150)
ALT: 13 U/L (ref 0–55)
AST: 13 U/L (ref 5–34)
Albumin: 3.6 g/dL (ref 3.5–5.0)
Anion Gap: 9 mEq/L (ref 3–11)
BUN: 13.4 mg/dL (ref 7.0–26.0)
CO2: 27 mEq/L (ref 22–29)
CREATININE: 0.8 mg/dL (ref 0.7–1.3)
Calcium: 9.5 mg/dL (ref 8.4–10.4)
Chloride: 107 mEq/L (ref 98–109)
GLUCOSE: 129 mg/dL (ref 70–140)
Potassium: 3.9 mEq/L (ref 3.5–5.1)
Sodium: 142 mEq/L (ref 136–145)
Total Bilirubin: 0.28 mg/dL (ref 0.20–1.20)
Total Protein: 7 g/dL (ref 6.4–8.3)

## 2013-04-20 LAB — CBC WITH DIFFERENTIAL/PLATELET
BASO%: 0.3 % (ref 0.0–2.0)
BASOS ABS: 0 10*3/uL (ref 0.0–0.1)
EOS%: 1.8 % (ref 0.0–7.0)
Eosinophils Absolute: 0.2 10*3/uL (ref 0.0–0.5)
HCT: 32 % — ABNORMAL LOW (ref 38.4–49.9)
HEMOGLOBIN: 10.6 g/dL — AB (ref 13.0–17.1)
LYMPH%: 12.6 % — ABNORMAL LOW (ref 14.0–49.0)
MCH: 31.5 pg (ref 27.2–33.4)
MCHC: 33.1 g/dL (ref 32.0–36.0)
MCV: 95 fL (ref 79.3–98.0)
MONO#: 1.2 10*3/uL — ABNORMAL HIGH (ref 0.1–0.9)
MONO%: 11.5 % (ref 0.0–14.0)
NEUT#: 7.9 10*3/uL — ABNORMAL HIGH (ref 1.5–6.5)
NEUT%: 73.8 % (ref 39.0–75.0)
Platelets: 221 10*3/uL (ref 140–400)
RBC: 3.37 10*6/uL — ABNORMAL LOW (ref 4.20–5.82)
RDW: 16.8 % — ABNORMAL HIGH (ref 11.0–14.6)
WBC: 10.7 10*3/uL — ABNORMAL HIGH (ref 4.0–10.3)
lymph#: 1.3 10*3/uL (ref 0.9–3.3)
nRBC: 0 % (ref 0–0)

## 2013-04-22 ENCOUNTER — Telehealth: Payer: Self-pay | Admitting: *Deleted

## 2013-04-22 DIAGNOSIS — C3490 Malignant neoplasm of unspecified part of unspecified bronchus or lung: Secondary | ICD-10-CM

## 2013-04-22 MED ORDER — GABAPENTIN 100 MG PO CAPS: 200.0000 mg | ORAL_CAPSULE | Freq: Three times a day (TID) | ORAL | Status: DC

## 2013-04-22 NOTE — Telephone Encounter (Signed)
Pt called stating he is having increased neuropathy pain.  He wanted to know what pain medication he can take for it.  Per Dr Vista Mink, okay to increase gabapentin from 100mg  TID to 200mg  TID.  He takes oxycodone at night but prefers to not take it during the day so he wanted to know what Dr Vista Mink recommended.  Per Dr Vista Mink, okay to take 1 tylenol 325mg  every 6 prn pain.  Pt verbalized understanding.  SLJ

## 2013-04-27 ENCOUNTER — Other Ambulatory Visit: Payer: Medicare HMO

## 2013-04-27 ENCOUNTER — Other Ambulatory Visit: Payer: Self-pay | Admitting: *Deleted

## 2013-04-27 ENCOUNTER — Other Ambulatory Visit (HOSPITAL_BASED_OUTPATIENT_CLINIC_OR_DEPARTMENT_OTHER): Payer: Medicare HMO

## 2013-04-27 DIAGNOSIS — C3410 Malignant neoplasm of upper lobe, unspecified bronchus or lung: Secondary | ICD-10-CM

## 2013-04-27 DIAGNOSIS — C3490 Malignant neoplasm of unspecified part of unspecified bronchus or lung: Secondary | ICD-10-CM

## 2013-04-27 LAB — COMPREHENSIVE METABOLIC PANEL (CC13)
ALBUMIN: 3.6 g/dL (ref 3.5–5.0)
ALK PHOS: 100 U/L (ref 40–150)
ALT: 12 U/L (ref 0–55)
AST: 17 U/L (ref 5–34)
Anion Gap: 9 mEq/L (ref 3–11)
BUN: 15.9 mg/dL (ref 7.0–26.0)
CHLORIDE: 112 meq/L — AB (ref 98–109)
CO2: 24 mEq/L (ref 22–29)
Calcium: 9.6 mg/dL (ref 8.4–10.4)
Creatinine: 1 mg/dL (ref 0.7–1.3)
GLUCOSE: 93 mg/dL (ref 70–140)
POTASSIUM: 4.5 meq/L (ref 3.5–5.1)
SODIUM: 145 meq/L (ref 136–145)
TOTAL PROTEIN: 7.3 g/dL (ref 6.4–8.3)
Total Bilirubin: 0.39 mg/dL (ref 0.20–1.20)

## 2013-04-27 LAB — CBC WITH DIFFERENTIAL/PLATELET
BASO%: 0.8 % (ref 0.0–2.0)
BASOS ABS: 0.1 10*3/uL (ref 0.0–0.1)
EOS ABS: 0.1 10*3/uL (ref 0.0–0.5)
EOS%: 0.8 % (ref 0.0–7.0)
HEMATOCRIT: 32.7 % — AB (ref 38.4–49.9)
HEMOGLOBIN: 10.8 g/dL — AB (ref 13.0–17.1)
LYMPH%: 11.4 % — AB (ref 14.0–49.0)
MCH: 31.8 pg (ref 27.2–33.4)
MCHC: 33 g/dL (ref 32.0–36.0)
MCV: 96.5 fL (ref 79.3–98.0)
MONO#: 0.7 10*3/uL (ref 0.1–0.9)
MONO%: 8.5 % (ref 0.0–14.0)
NEUT%: 78.5 % — AB (ref 39.0–75.0)
NEUTROS ABS: 6.7 10*3/uL — AB (ref 1.5–6.5)
PLATELETS: 228 10*3/uL (ref 140–400)
RBC: 3.39 10*6/uL — ABNORMAL LOW (ref 4.20–5.82)
RDW: 19 % — ABNORMAL HIGH (ref 11.0–14.6)
WBC: 8.5 10*3/uL (ref 4.0–10.3)
lymph#: 1 10*3/uL (ref 0.9–3.3)

## 2013-04-27 MED ORDER — OXYCODONE-ACETAMINOPHEN 5-325 MG PO TABS: 1.0000 | ORAL_TABLET | Freq: Four times a day (QID) | ORAL | Status: DC | PRN

## 2013-04-28 ENCOUNTER — Other Ambulatory Visit: Payer: Medicare HMO

## 2013-04-28 DIAGNOSIS — E1365 Other specified diabetes mellitus with hyperglycemia: Secondary | ICD-10-CM

## 2013-04-28 DIAGNOSIS — E138 Other specified diabetes mellitus with unspecified complications: Secondary | ICD-10-CM

## 2013-04-28 DIAGNOSIS — E038 Other specified hypothyroidism: Secondary | ICD-10-CM

## 2013-04-28 DIAGNOSIS — E039 Hypothyroidism, unspecified: Secondary | ICD-10-CM

## 2013-04-28 DIAGNOSIS — E785 Hyperlipidemia, unspecified: Secondary | ICD-10-CM

## 2013-04-28 DIAGNOSIS — IMO0002 Reserved for concepts with insufficient information to code with codable children: Secondary | ICD-10-CM

## 2013-04-29 ENCOUNTER — Other Ambulatory Visit: Payer: Self-pay | Admitting: Internal Medicine

## 2013-04-29 ENCOUNTER — Ambulatory Visit (INDEPENDENT_AMBULATORY_CARE_PROVIDER_SITE_OTHER): Payer: Medicare HMO | Admitting: Internal Medicine

## 2013-04-29 ENCOUNTER — Encounter: Payer: Self-pay | Admitting: *Deleted

## 2013-04-29 VITALS — BP 128/80 | HR 90 | Temp 97.7°F | Wt 198.8 lb

## 2013-04-29 DIAGNOSIS — G894 Chronic pain syndrome: Secondary | ICD-10-CM | POA: Insufficient documentation

## 2013-04-29 DIAGNOSIS — M792 Neuralgia and neuritis, unspecified: Secondary | ICD-10-CM | POA: Insufficient documentation

## 2013-04-29 DIAGNOSIS — E1149 Type 2 diabetes mellitus with other diabetic neurological complication: Secondary | ICD-10-CM

## 2013-04-29 DIAGNOSIS — E039 Hypothyroidism, unspecified: Secondary | ICD-10-CM

## 2013-04-29 DIAGNOSIS — E038 Other specified hypothyroidism: Secondary | ICD-10-CM

## 2013-04-29 DIAGNOSIS — C3490 Malignant neoplasm of unspecified part of unspecified bronchus or lung: Secondary | ICD-10-CM

## 2013-04-29 DIAGNOSIS — E785 Hyperlipidemia, unspecified: Secondary | ICD-10-CM

## 2013-04-29 DIAGNOSIS — IMO0002 Reserved for concepts with insufficient information to code with codable children: Secondary | ICD-10-CM

## 2013-04-29 DIAGNOSIS — I4891 Unspecified atrial fibrillation: Secondary | ICD-10-CM

## 2013-04-29 DIAGNOSIS — E1142 Type 2 diabetes mellitus with diabetic polyneuropathy: Secondary | ICD-10-CM

## 2013-04-29 DIAGNOSIS — I480 Paroxysmal atrial fibrillation: Secondary | ICD-10-CM | POA: Insufficient documentation

## 2013-04-29 LAB — LIPID PANEL
CHOLESTEROL TOTAL: 180 mg/dL (ref 100–199)
Chol/HDL Ratio: 4.9 ratio units (ref 0.0–5.0)
HDL: 37 mg/dL — ABNORMAL LOW (ref >39)
LDL Calculated: 110 mg/dL — ABNORMAL HIGH (ref 0–99)
Triglycerides: 167 mg/dL — ABNORMAL HIGH (ref 0–149)
VLDL CHOLESTEROL CAL: 33 mg/dL (ref 5–40)

## 2013-04-29 LAB — COMPREHENSIVE METABOLIC PANEL
ALT: 12 IU/L (ref 0–44)
AST: 19 IU/L (ref 0–40)
Albumin/Globulin Ratio: 1.6 (ref 1.1–2.5)
Albumin: 4.2 g/dL (ref 3.5–4.8)
Alkaline Phosphatase: 107 IU/L (ref 39–117)
BILIRUBIN TOTAL: 0.4 mg/dL (ref 0.0–1.2)
BUN/Creatinine Ratio: 14 (ref 10–22)
BUN: 12 mg/dL (ref 8–27)
CHLORIDE: 103 mmol/L (ref 97–108)
CO2: 24 mmol/L (ref 18–29)
Calcium: 9.9 mg/dL (ref 8.6–10.2)
Creatinine, Ser: 0.87 mg/dL (ref 0.76–1.27)
GFR calc Af Amer: 100 mL/min/{1.73_m2} (ref >59)
GFR calc non Af Amer: 86 mL/min/{1.73_m2} (ref >59)
Globulin, Total: 2.6 g/dL (ref 1.5–4.5)
Glucose: 103 mg/dL — ABNORMAL HIGH (ref 65–99)
POTASSIUM: 5.1 mmol/L (ref 3.5–5.2)
Sodium: 142 mmol/L (ref 134–144)
TOTAL PROTEIN: 6.8 g/dL (ref 6.0–8.5)

## 2013-04-29 LAB — CBC WITH DIFFERENTIAL/PLATELET
BASOS: 0 %
Basophils Absolute: 0 10*3/uL (ref 0.0–0.2)
EOS ABS: 0.1 10*3/uL (ref 0.0–0.4)
Eos: 1 %
HCT: 33.5 % — ABNORMAL LOW (ref 37.5–51.0)
HEMOGLOBIN: 11.1 g/dL — AB (ref 12.6–17.7)
Immature Grans (Abs): 0 10*3/uL (ref 0.0–0.1)
Immature Granulocytes: 0 %
Lymphocytes Absolute: 1 10*3/uL (ref 0.7–3.1)
Lymphs: 11 %
MCH: 31.4 pg (ref 26.6–33.0)
MCHC: 33.1 g/dL (ref 31.5–35.7)
MCV: 95 fL (ref 79–97)
MONOS ABS: 0.6 10*3/uL (ref 0.1–0.9)
Monocytes: 7 %
NEUTROS ABS: 7.6 10*3/uL — AB (ref 1.4–7.0)
Neutrophils Relative %: 81 %
RBC: 3.54 x10E6/uL — AB (ref 4.14–5.80)
RDW: 17.8 % — ABNORMAL HIGH (ref 12.3–15.4)
WBC: 9.4 10*3/uL (ref 3.4–10.8)

## 2013-04-29 LAB — MICROALBUMIN / CREATININE URINE RATIO
CREATININE UR: 162.8 mg/dL (ref 22.0–328.0)
MICROALB/CREAT RATIO: 5.2 mg/g creat (ref 0.0–30.0)
MICROALBUM., U, RANDOM: 8.5 ug/mL (ref 0.0–17.0)

## 2013-04-29 LAB — TSH: TSH: 3.14 u[IU]/mL (ref 0.450–4.500)

## 2013-04-29 LAB — HEMOGLOBIN A1C
Est. average glucose Bld gHb Est-mCnc: 128 mg/dL
Hgb A1c MFr Bld: 6.1 % — ABNORMAL HIGH (ref 4.8–5.6)

## 2013-04-29 MED ORDER — GABAPENTIN 300 MG PO CAPS: 300.0000 mg | ORAL_CAPSULE | Freq: Three times a day (TID) | ORAL | Status: DC

## 2013-04-29 NOTE — Progress Notes (Signed)
Patient ID: Steve [name], male   DOB: 1940-05-26, 73 y.o.   MRN: [id number]    Chief Complaint  Patient presents with  . Medical Management of Chronic Issues    5 moth f/u & discuss labs (printed)  . other    tingling and pain in fingers and toes and legs   Allergies  Allergen Reactions  . Morphine And Related    HPI 73 y/o male patient is here for routine follow up. Since his last visit in 11/14 he has been diagnosed with non small cell lung cancer. He has completed radiation therapy He is now on Consolidation chemotherapy with carboplatin and paclitaxel He also had afib in the interim and is on diltiazem. He denies any palpitations. He complaints of numbness with tingling and pain in the tip of his finger and toes.  His energy level is fair, appetite is good Denies any other complaints Compliant with his medications  Review of Systems  Constitutional: Negative for fever, chills, malaise/fatigue and diaphoresis.  HENT: Negative for congestion, hearing loss and sore throat.   Eyes: Negative for blurred vision, double vision and discharge.  Respiratory: Negative for sputum production, shortness of breath and wheezing.   Cardiovascular: Negative for chest pain, palpitations, orthopnea and leg swelling.  Gastrointestinal: Negative for heartburn, nausea, vomiting, abdominal pain  Genitourinary: Negative for dysuria, urgency, frequency and flank pain.  Musculoskeletal: Negative for back pain Skin: Negative for itching and rash.  Neurological: Negative for dizziness, tingling, focal weakness and headaches.  Psychiatric/Behavioral: Negative for depression and memory loss. The patient is not nervous/anxious.    Past Medical History  Diagnosis Date  . Dizziness and giddiness     positional vertigo  . Malignant neoplasm of bladder, part unspecified   . Hyperlipidemia   . Impotence of organic origin   . Osteoarthritis, shoulder   . Allergy   . Hypothyroidism     pt denies thryoid  disease, dr Hamad Whyte note 11-19-12 says subclinical hypothryoid in epic  . COPD (chronic obstructive pulmonary disease)   . Diabetes mellitus without complication     pt denies diabetes, dr Tanga Gloor note says dm 11-19-12 epic  . Malignant neoplasm of bronchus and lung, unspecified site    Current Outpatient Prescriptions on File Prior to Visit  Medication Sig Dispense Refill  . aspirin 325 MG tablet Take 325 mg by mouth daily.      Marland Kitchen diltiazem (CARDIZEM CD) 180 MG 24 hr capsule Take 1 capsule (180 mg total) by mouth daily.  90 capsule  3  . oxyCODONE-acetaminophen (ROXICET) 5-325 MG per tablet Take 1 tablet by mouth every 6 (six) hours as needed for severe pain.  40 tablet  0  . simvastatin (ZOCOR) 20 MG tablet Take one tablet by mouth once daily to lower cholesterol  90 tablet  3  . temazepam (RESTORIL) 30 MG capsule Take 1 capsule (30 mg total) by mouth at bedtime as needed for sleep.  30 capsule  0   No current facility-administered medications on file prior to visit.   Physical exam BP 128/80  Pulse 90  Temp(Src) 97.7 F (36.5 C) (Oral)  Wt 198 lb 12.8 oz (90.175 kg)  SpO2 98%  General- elderly male in no acute distress Head- atraumatic, normocephalic Eyes- PERRLA, EOMI, no pallor, no icterus, no discharge Neck- no lymphadenopathy, no thyromegaly, no jugular vein distension, no palpable cervical lymphadenopathy Cardiovascular- normal s1,s2, no murmurs/ rubs/ gallops Respiratory- bilateral clear to auscultation, no wheeze, no rhonchi, no crackles  Abdomen- bowel sounds present, soft, non tender Musculoskeletal- able to move all 4 extremities, no spinal and paraspinal tenderness, steady gait, no use of assistive device Neurological- no focal deficit Psychiatry- alert and oriented to person, place and time, normal mood and affect Skin- dry skin, warm   Labs-  CMP     Component Value Date/Time   NA 142 04/28/2013 0819   NA 145 04/27/2013 1317   K 5.1 04/28/2013 0819   K 4.5  04/27/2013 1317   CL 103 04/28/2013 0819   CO2 24 04/28/2013 0819   CO2 24 04/27/2013 1317   GLUCOSE 103* 04/28/2013 0819   GLUCOSE 93 04/27/2013 1317   BUN 12 04/28/2013 0819   BUN 15.9 04/27/2013 1317   CREATININE 0.87 04/28/2013 0819   CREATININE 1.0 04/27/2013 1317   CALCIUM 9.9 04/28/2013 0819   CALCIUM 9.6 04/27/2013 1317   PROT 6.8 04/28/2013 0819   PROT 7.3 04/27/2013 1317   ALBUMIN 3.6 04/27/2013 1317   AST 19 04/28/2013 0819   AST 17 04/27/2013 1317   ALT 12 04/28/2013 0819   ALT 12 04/27/2013 1317   ALKPHOS 107 04/28/2013 0819   ALKPHOS 100 04/27/2013 1317   BILITOT 0.4 04/28/2013 0819   BILITOT 0.39 04/27/2013 1317   GFRNONAA 86 04/28/2013 0819   GFRAA 100 04/28/2013 0819    Assessment/plan  1. Lung cancer Undergoing chemotherapy at present. Follows with oncology and recent notes reviewed.   2. Hyperlipidemia Continue zocor 20 mg daily for now  3. Neuropathic pain Persists,likely from his hx of dm and sideeffect of his radiation and chemotherapy- continue neurontin 300 mg but will increase this to tid from bid and reassess  4. A-fib Rate controlled. continue diltiazem and aspirin for now  5. Subclinical hypothyroidism Recheck tsh  6. Chronic pain disorder roxicet current regimen has been helpful. Continue this.  7. DM type 2 with diabetic peripheral neuropathy Diet controlled at present. Recheck a1c today. - Hemoglobin A1c; Future

## 2013-05-04 ENCOUNTER — Telehealth: Payer: Self-pay | Admitting: Internal Medicine

## 2013-05-04 ENCOUNTER — Ambulatory Visit (HOSPITAL_BASED_OUTPATIENT_CLINIC_OR_DEPARTMENT_OTHER): Payer: Medicare HMO

## 2013-05-04 ENCOUNTER — Other Ambulatory Visit: Payer: Medicare HMO

## 2013-05-04 ENCOUNTER — Other Ambulatory Visit: Payer: Self-pay | Admitting: Medical Oncology

## 2013-05-04 ENCOUNTER — Ambulatory Visit (HOSPITAL_BASED_OUTPATIENT_CLINIC_OR_DEPARTMENT_OTHER): Payer: Medicare HMO | Admitting: Internal Medicine

## 2013-05-04 ENCOUNTER — Encounter: Payer: Self-pay | Admitting: Internal Medicine

## 2013-05-04 ENCOUNTER — Other Ambulatory Visit (HOSPITAL_BASED_OUTPATIENT_CLINIC_OR_DEPARTMENT_OTHER): Payer: Medicare HMO

## 2013-05-04 VITALS — BP 137/78 | HR 116 | Temp 97.8°F | Resp 18 | Ht 73.0 in | Wt 201.0 lb

## 2013-05-04 DIAGNOSIS — C3490 Malignant neoplasm of unspecified part of unspecified bronchus or lung: Secondary | ICD-10-CM

## 2013-05-04 DIAGNOSIS — C3430 Malignant neoplasm of lower lobe, unspecified bronchus or lung: Secondary | ICD-10-CM

## 2013-05-04 DIAGNOSIS — Z5111 Encounter for antineoplastic chemotherapy: Secondary | ICD-10-CM

## 2013-05-04 DIAGNOSIS — G609 Hereditary and idiopathic neuropathy, unspecified: Secondary | ICD-10-CM

## 2013-05-04 LAB — CBC WITH DIFFERENTIAL/PLATELET
BASO%: 0.1 % (ref 0.0–2.0)
BASOS ABS: 0 10*3/uL (ref 0.0–0.1)
EOS ABS: 0.1 10*3/uL (ref 0.0–0.5)
EOS%: 1.4 % (ref 0.0–7.0)
HEMATOCRIT: 34.9 % — AB (ref 38.4–49.9)
HGB: 11.4 g/dL — ABNORMAL LOW (ref 13.0–17.1)
LYMPH%: 11.9 % — ABNORMAL LOW (ref 14.0–49.0)
MCH: 31.5 pg (ref 27.2–33.4)
MCHC: 32.7 g/dL (ref 32.0–36.0)
MCV: 96.4 fL (ref 79.3–98.0)
MONO#: 1 10*3/uL — ABNORMAL HIGH (ref 0.1–0.9)
MONO%: 12.9 % (ref 0.0–14.0)
NEUT%: 73.7 % (ref 39.0–75.0)
NEUTROS ABS: 5.9 10*3/uL (ref 1.5–6.5)
PLATELETS: 254 10*3/uL (ref 140–400)
RBC: 3.62 10*6/uL — ABNORMAL LOW (ref 4.20–5.82)
RDW: 16.9 % — ABNORMAL HIGH (ref 11.0–14.6)
WBC: 8 10*3/uL (ref 4.0–10.3)
lymph#: 1 10*3/uL (ref 0.9–3.3)
nRBC: 0 % (ref 0–0)

## 2013-05-04 LAB — COMPREHENSIVE METABOLIC PANEL (CC13)
ALT: 13 U/L (ref 0–55)
ANION GAP: 10 meq/L (ref 3–11)
AST: 14 U/L (ref 5–34)
Albumin: 3.3 g/dL — ABNORMAL LOW (ref 3.5–5.0)
Alkaline Phosphatase: 86 U/L (ref 40–150)
BILIRUBIN TOTAL: 0.34 mg/dL (ref 0.20–1.20)
BUN: 11.2 mg/dL (ref 7.0–26.0)
CO2: 22 meq/L (ref 22–29)
Calcium: 9.6 mg/dL (ref 8.4–10.4)
Chloride: 111 mEq/L — ABNORMAL HIGH (ref 98–109)
Creatinine: 0.8 mg/dL (ref 0.7–1.3)
GLUCOSE: 145 mg/dL — AB (ref 70–140)
Potassium: 4 mEq/L (ref 3.5–5.1)
SODIUM: 144 meq/L (ref 136–145)
TOTAL PROTEIN: 7.2 g/dL (ref 6.4–8.3)

## 2013-05-04 MED ORDER — FAMOTIDINE IN NACL 20-0.9 MG/50ML-% IV SOLN
20.0000 mg | Freq: Once | INTRAVENOUS | Status: AC
  Administered 2013-05-04: 20 mg via INTRAVENOUS

## 2013-05-04 MED ORDER — DIPHENHYDRAMINE HCL 50 MG/ML IJ SOLN
INTRAMUSCULAR | Status: AC
  Filled 2013-05-04: qty 1

## 2013-05-04 MED ORDER — DIPHENHYDRAMINE HCL 50 MG/ML IJ SOLN
50.0000 mg | Freq: Once | INTRAMUSCULAR | Status: AC
  Administered 2013-05-04: 50 mg via INTRAVENOUS

## 2013-05-04 MED ORDER — PACLITAXEL CHEMO INJECTION 300 MG/50ML
150.0000 mg/m2 | Freq: Once | INTRAVENOUS | Status: AC
  Administered 2013-05-04: 318 mg via INTRAVENOUS
  Filled 2013-05-04: qty 53

## 2013-05-04 MED ORDER — ONDANSETRON 16 MG/50ML IVPB (CHCC)
16.0000 mg | Freq: Once | INTRAVENOUS | Status: AC
  Administered 2013-05-04: 16 mg via INTRAVENOUS

## 2013-05-04 MED ORDER — TEMAZEPAM 30 MG PO CAPS: 30.0000 mg | ORAL_CAPSULE | Freq: Every evening | ORAL | Status: DC | PRN

## 2013-05-04 MED ORDER — SODIUM CHLORIDE 0.9 % IV SOLN
550.0000 mg | Freq: Once | INTRAVENOUS | Status: AC
  Administered 2013-05-04: 550 mg via INTRAVENOUS
  Filled 2013-05-04: qty 55

## 2013-05-04 MED ORDER — DEXAMETHASONE SODIUM PHOSPHATE 20 MG/5ML IJ SOLN
INTRAMUSCULAR | Status: AC
  Filled 2013-05-04: qty 5

## 2013-05-04 MED ORDER — SODIUM CHLORIDE 0.9 % IV SOLN
Freq: Once | INTRAVENOUS | Status: AC
  Administered 2013-05-04: 14:00:00 via INTRAVENOUS

## 2013-05-04 MED ORDER — DEXAMETHASONE SODIUM PHOSPHATE 20 MG/5ML IJ SOLN
20.0000 mg | Freq: Once | INTRAMUSCULAR | Status: AC
  Administered 2013-05-04: 20 mg via INTRAVENOUS

## 2013-05-04 MED ORDER — FAMOTIDINE IN NACL 20-0.9 MG/50ML-% IV SOLN
INTRAVENOUS | Status: AC
  Filled 2013-05-04: qty 50

## 2013-05-04 MED ORDER — OXYCODONE-ACETAMINOPHEN 5-325 MG PO TABS: 1.0000 | ORAL_TABLET | Freq: Four times a day (QID) | ORAL | Status: DC | PRN

## 2013-05-04 MED ORDER — ONDANSETRON 16 MG/50ML IVPB (CHCC)
INTRAVENOUS | Status: AC
  Filled 2013-05-04: qty 16

## 2013-05-04 NOTE — Telephone Encounter (Signed)
gv adn printed appt sched and avs for pt for May

## 2013-05-04 NOTE — Telephone Encounter (Signed)
Dr Julien Nordmann notified of pt requests for refill.

## 2013-05-04 NOTE — Progress Notes (Signed)
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   Primary site: Lung (Right)  Staging method: AJCC 7th Edition  Clinical: Stage IIIA (T2b, N2, M0)  Summary: Stage IIIA (T2b, N2, M0)   PRIOR THERAPY: Concurrent chemoradiation with weekly carboplatin for an AUC of 2 and paclitaxel 45 mg/m2, status post 4 cycles.    CURRENT THERAPY: Consolidation chemotherapy with carboplatin for AUC of 5 and paclitaxel 175 mg/M2 every 3 weeks with Neulasta support, first cycle on 03/23/2013. Status post 2 cycles.  DISEASE STAGE:  Lung cancer  Primary site: Lung (Right)  Staging method: AJCC 7th Edition  Clinical: Stage IIIA (T2b, N2, M0)  Summary: Stage IIIA (T2b, N2, M0)  CHEMOTHERAPY INTENT: Control/Palliative  CURRENT # OF CHEMOTHERAPY CYCLES: 3 CURRENT ANTIEMETICS: Zofran, dexamethasone, Compazine  CURRENT SMOKING STATUS: Former smoker, quit 01/09/2007  ORAL CHEMOTHERAPY AND CONSENT: n/a  CURRENT BISPHOSPHONATES USE: none  PAIN MANAGEMENT: Ibuprofen  NARCOTICS INDUCED CONSTIPATION: None  LIVING WILL AND CODE STATUS: ?  INTERVAL HISTORY: Steve [name] 73 y.o. male returns to the clinic today for followup visit. 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 He is currently on Neurontin 300 mg by mouth 3 times a day. He tolerated the second cycle of consolidation chemotherapy fairly well except for the peripheral neuropathy and pain after the Neulasta injection. He is also concerned about the copayment for the Neulasta injection which was over $700 and he would like to try this third cycle of his chemotherapy without Neulasta. He denied having any significant  shortness of breath, cough or hemoptysis. He denied having any weight loss or night sweats. The patient has no fever or chills and no nausea or vomiting.   MEDICAL HISTORY: Past Medical History  Diagnosis Date  . Dizziness and giddiness     positional vertigo  . Malignant neoplasm of bladder, part unspecified   . Hyperlipidemia   . Impotence of organic origin   . Osteoarthritis, shoulder   . Allergy   . Hypothyroidism     pt denies thryoid disease, dr pandey note 11-19-12 says subclinical hypothryoid in epic  . COPD (chronic obstructive pulmonary disease)   . Diabetes mellitus without complication     pt denies diabetes, dr pandey note says dm 11-19-12 epic  . Malignant neoplasm of bronchus and lung, unspecified site     ALLERGIES:  is allergic to morphine and related.  MEDICATIONS:  Current Outpatient Prescriptions  Medication Sig Dispense Refill  . aspirin 325 MG tablet Take 325 mg by mouth daily.      Marland Kitchen diltiazem (CARDIZEM CD) 180 MG 24 hr capsule Take 1 capsule (180 mg total) by mouth daily.  90 capsule  3  . gabapentin (NEURONTIN) 300 MG capsule TAKE ONE CAPSULE BY MOUTH THREE TIMES DAILY  270 capsule  0  . omeprazole (PRILOSEC) 20 MG capsule       . oxyCODONE-acetaminophen (ROXICET) 5-325 MG per tablet Take 1 tablet by mouth every 6 (six) hours as needed for severe pain.  40 tablet  0  . simvastatin (ZOCOR) 20 MG tablet Take one tablet by mouth once daily to lower cholesterol  90 tablet  3  . temazepam (  RESTORIL) 30 MG capsule Take 1 capsule (30 mg total) by mouth at bedtime as needed for sleep.  30 capsule  0   No current facility-administered medications for this visit.    SURGICAL HISTORY:  Past Surgical History  Procedure Laterality Date  . Cystoscopy w/ dilation of bladder    . Tonsillectomy  age 61  . Endobronchial ultrasound Bilateral 12/15/2012    Procedure: ENDOBRONCHIAL ULTRASOUND;  Surgeon: Brand Males, MD;  Location: [facility];  Service:  Cardiopulmonary;  Laterality: Bilateral;  . Colonoscopy  2009    [facility]    REVIEW OF SYSTEMS:  Constitutional: negative Eyes: negative Ears, nose, mouth, throat, and face: negative Respiratory: negative Cardiovascular: negative Gastrointestinal: positive for odynophagia Genitourinary:negative Integument/breast: negative Hematologic/lymphatic: negative Musculoskeletal:negative Neurological: negative Behavioral/Psych: negative Endocrine: negative Allergic/Immunologic: negative   PHYSICAL EXAMINATION: General appearance: alert, cooperative and no distress Head: Normocephalic, without obvious abnormality, atraumatic Neck: no adenopathy, no JVD, supple, symmetrical, trachea midline and thyroid not enlarged, symmetric, no tenderness/mass/nodules Lymph nodes: Cervical, supraclavicular, and axillary nodes normal. Resp: clear to auscultation bilaterally Back: symmetric, no curvature. ROM normal. No CVA tenderness. Cardio: regular rate and rhythm, S1, S2 normal, no murmur, click, rub or gallop GI: soft, non-tender; bowel sounds normal; no masses,  no organomegaly Extremities: extremities normal, atraumatic, no cyanosis or edema Neurologic: Alert and oriented X 3, normal strength and tone. Normal symmetric reflexes. Normal coordination and gait  ECOG PERFORMANCE STATUS: 1 - Symptomatic but completely ambulatory  Blood pressure 137/78, pulse 116, temperature 97.8 F (36.6 C), temperature source Oral, resp. rate 18, height 6\' 1"  (1.854 m), weight 201 lb (91.173 kg), SpO2 97.00%.  LABORATORY DATA: Lab Results  Component Value Date   WBC 8.0 05/04/2013   HGB 11.4* 05/04/2013   HCT 34.9* 05/04/2013   MCV 96.4 05/04/2013   PLT 254 05/04/2013      Chemistry      Component Value Date/Time   NA 142 04/28/2013 0819   NA 145 04/27/2013 1317   K 5.1 04/28/2013 0819   K 4.5 04/27/2013 1317   CL 103 04/28/2013 0819   CO2 24 04/28/2013 0819   CO2 24 04/27/2013 1317   BUN 12 04/28/2013 0819    BUN 15.9 04/27/2013 1317   CREATININE 0.87 04/28/2013 0819   CREATININE 1.0 04/27/2013 1317      Component Value Date/Time   CALCIUM 9.9 04/28/2013 0819   CALCIUM 9.6 04/27/2013 1317   ALKPHOS 107 04/28/2013 0819   ALKPHOS 100 04/27/2013 1317   AST 19 04/28/2013 0819   AST 17 04/27/2013 1317   ALT 12 04/28/2013 0819   ALT 12 04/27/2013 1317   BILITOT 0.4 04/28/2013 0819   BILITOT 0.39 04/27/2013 1317       RADIOGRAPHIC STUDIES:  ASSESSMENT AND PLAN: This is a very pleasant 73 years old [place] male with history of stage IIIa non-small cell lung cancer who completed a course of concurrent chemoradiation with weekly carboplatin and paclitaxel with no significant complaints except for radiation induced odynophagia.  He is currently undergoing consolidation chemotherapy with carboplatin and paclitaxel is status post 2 cycles. He has fatigue and weakness after the Neulasta injection. He also complains of peripheral neuropathy mainly on the toes and a little bit up to the lower leg. I recommended for the patient to proceed with cycle #2 of his chemotherapy today but I would reduce the dose of paclitaxel to 150 mg/M2 every 3 weeks. I would discontinue his Neulasta injection for tomorrow and consider the  patient for Neupogen if needed. For the peripheral neuropathy, he'll continue on Neurontin 300 mg by mouth 3 times a day. He would come back for followup visit in 3 weeks with repeat CT scan of the chest for restaging of his disease. He was advised to call immediately if he has any concerning symptoms in the interval.  The patient voices understanding of current disease status and treatment options and is in agreement with the current care plan.  All questions were answered. The patient knows to call the clinic with any problems, questions or concerns. We can certainly see the patient much sooner if necessary.  Disclaimer: This note was dictated with voice recognition software. Similar sounding words can  inadvertently be transcribed and may not be corrected upon review.

## 2013-05-04 NOTE — Patient Instructions (Signed)
[facility] Discharge Instructions for Patients Receiving Chemotherapy  Today you received the following chemotherapy agents taxol/carboplatin  To help prevent nausea and vomiting after your treatment, we encourage you to take your nausea medication as directed   If you develop nausea and vomiting that is not controlled by your nausea medication, call the clinic.   BELOW ARE SYMPTOMS THAT SHOULD BE REPORTED IMMEDIATELY:  *FEVER GREATER THAN 100.5 F  *CHILLS WITH OR WITHOUT FEVER  NAUSEA AND VOMITING THAT IS NOT CONTROLLED WITH YOUR NAUSEA MEDICATION  *UNUSUAL SHORTNESS OF BREATH  *UNUSUAL BRUISING OR BLEEDING  TENDERNESS IN MOUTH AND THROAT WITH OR WITHOUT PRESENCE OF ULCERS  *URINARY PROBLEMS  *BOWEL PROBLEMS  UNUSUAL RASH Items with * indicate a potential emergency and should be followed up as soon as possible.  Feel free to call the clinic you have any questions or concerns. The clinic phone number is [phone].  

## 2013-05-05 ENCOUNTER — Ambulatory Visit: Payer: Medicare HMO

## 2013-05-05 ENCOUNTER — Encounter: Payer: Self-pay | Admitting: Internal Medicine

## 2013-05-05 NOTE — Progress Notes (Signed)
Received letter from Patient [employer].  Pt is approved for Neulasta from 05/04/13 to 05/04/14 or when benefit cap has been met.  Expenses can be submitted for reimbursement for dos 02/03/13 to 05/04/14.  Amount of grant is $4,100.  I will forward copy of letter to [facility] in billing.

## 2013-05-11 ENCOUNTER — Other Ambulatory Visit (HOSPITAL_BASED_OUTPATIENT_CLINIC_OR_DEPARTMENT_OTHER): Payer: Medicare HMO

## 2013-05-11 DIAGNOSIS — C3490 Malignant neoplasm of unspecified part of unspecified bronchus or lung: Secondary | ICD-10-CM

## 2013-05-11 LAB — CBC WITH DIFFERENTIAL/PLATELET
BASO%: 0.5 % (ref 0.0–2.0)
Basophils Absolute: 0 10*3/uL (ref 0.0–0.1)
EOS ABS: 0.1 10*3/uL (ref 0.0–0.5)
EOS%: 2.5 % (ref 0.0–7.0)
HEMATOCRIT: 29.8 % — AB (ref 38.4–49.9)
HGB: 10.2 g/dL — ABNORMAL LOW (ref 13.0–17.1)
LYMPH#: 0.8 10*3/uL — AB (ref 0.9–3.3)
LYMPH%: 19.9 % (ref 14.0–49.0)
MCH: 32.8 pg (ref 27.2–33.4)
MCHC: 34.2 g/dL (ref 32.0–36.0)
MCV: 95.9 fL (ref 79.3–98.0)
MONO#: 0.1 10*3/uL (ref 0.1–0.9)
MONO%: 3.3 % (ref 0.0–14.0)
NEUT%: 73.8 % (ref 39.0–75.0)
NEUTROS ABS: 3.1 10*3/uL (ref 1.5–6.5)
PLATELETS: 250 10*3/uL (ref 140–400)
RBC: 3.11 10*6/uL — AB (ref 4.20–5.82)
RDW: 16.8 % — ABNORMAL HIGH (ref 11.0–14.6)
WBC: 4.2 10*3/uL (ref 4.0–10.3)

## 2013-05-11 LAB — COMPREHENSIVE METABOLIC PANEL (CC13)
ALBUMIN: 3.3 g/dL — AB (ref 3.5–5.0)
ALT: 9 U/L (ref 0–55)
AST: 12 U/L (ref 5–34)
Alkaline Phosphatase: 84 U/L (ref 40–150)
Anion Gap: 9 mEq/L (ref 3–11)
BILIRUBIN TOTAL: 0.34 mg/dL (ref 0.20–1.20)
BUN: 19.3 mg/dL (ref 7.0–26.0)
CALCIUM: 9.5 mg/dL (ref 8.4–10.4)
CO2: 24 meq/L (ref 22–29)
Chloride: 107 mEq/L (ref 98–109)
Creatinine: 0.9 mg/dL (ref 0.7–1.3)
GLUCOSE: 110 mg/dL (ref 70–140)
Potassium: 4 mEq/L (ref 3.5–5.1)
SODIUM: 141 meq/L (ref 136–145)
TOTAL PROTEIN: 7 g/dL (ref 6.4–8.3)

## 2013-05-13 LAB — SPECIMEN STATUS REPORT

## 2013-05-14 LAB — VITAMIN B12: Vitamin B-12: >1999 pg/mL — ABNORMAL HIGH (ref 211–946)

## 2013-05-14 LAB — FOLATE: Folate: 10 ng/mL (ref >3.0)

## 2013-05-14 LAB — SPECIMEN STATUS REPORT

## 2013-05-18 ENCOUNTER — Other Ambulatory Visit (HOSPITAL_BASED_OUTPATIENT_CLINIC_OR_DEPARTMENT_OTHER): Payer: Medicare HMO

## 2013-05-18 DIAGNOSIS — C3430 Malignant neoplasm of lower lobe, unspecified bronchus or lung: Secondary | ICD-10-CM

## 2013-05-18 DIAGNOSIS — C3490 Malignant neoplasm of unspecified part of unspecified bronchus or lung: Secondary | ICD-10-CM

## 2013-05-18 LAB — COMPREHENSIVE METABOLIC PANEL (CC13)
ALBUMIN: 3.1 g/dL — AB (ref 3.5–5.0)
ALT: 13 U/L (ref 0–55)
ANION GAP: 9 meq/L (ref 3–11)
AST: 13 U/L (ref 5–34)
Alkaline Phosphatase: 66 U/L (ref 40–150)
BUN: 11.1 mg/dL (ref 7.0–26.0)
CHLORIDE: 107 meq/L (ref 98–109)
CO2: 26 meq/L (ref 22–29)
CREATININE: 0.7 mg/dL (ref 0.7–1.3)
Calcium: 9.6 mg/dL (ref 8.4–10.4)
Glucose: 120 mg/dl (ref 70–140)
POTASSIUM: 3.8 meq/L (ref 3.5–5.1)
Sodium: 142 mEq/L (ref 136–145)
Total Bilirubin: 0.3 mg/dL (ref 0.20–1.20)
Total Protein: 7.1 g/dL (ref 6.4–8.3)

## 2013-05-18 LAB — CBC WITH DIFFERENTIAL/PLATELET
BASO%: 0.5 % (ref 0.0–2.0)
Basophils Absolute: 0 10*3/uL (ref 0.0–0.1)
EOS%: 3.2 % (ref 0.0–7.0)
Eosinophils Absolute: 0.1 10*3/uL (ref 0.0–0.5)
HCT: 31.2 % — ABNORMAL LOW (ref 38.4–49.9)
HGB: 10.3 g/dL — ABNORMAL LOW (ref 13.0–17.1)
LYMPH%: 36.9 % (ref 14.0–49.0)
MCH: 31.2 pg (ref 27.2–33.4)
MCHC: 33 g/dL (ref 32.0–36.0)
MCV: 94.5 fL (ref 79.3–98.0)
MONO#: 0.5 10*3/uL (ref 0.1–0.9)
MONO%: 26.7 % — ABNORMAL HIGH (ref 0.0–14.0)
NEUT#: 0.6 10*3/uL — ABNORMAL LOW (ref 1.5–6.5)
NEUT%: 32.7 % — AB (ref 39.0–75.0)
NRBC: 0 % (ref 0–0)
Platelets: 260 10*3/uL (ref 140–400)
RBC: 3.3 10*6/uL — AB (ref 4.20–5.82)
RDW: 15.3 % — AB (ref 11.0–14.6)
WBC: 1.9 10*3/uL — ABNORMAL LOW (ref 4.0–10.3)
lymph#: 0.7 10*3/uL — ABNORMAL LOW (ref 0.9–3.3)

## 2013-05-19 ENCOUNTER — Ambulatory Visit (HOSPITAL_BASED_OUTPATIENT_CLINIC_OR_DEPARTMENT_OTHER): Payer: Medicare HMO

## 2013-05-19 ENCOUNTER — Telehealth: Payer: Self-pay | Admitting: *Deleted

## 2013-05-19 ENCOUNTER — Other Ambulatory Visit: Payer: Self-pay | Admitting: *Deleted

## 2013-05-19 VITALS — BP 140/76 | HR 95 | Temp 97.8°F

## 2013-05-19 DIAGNOSIS — Z5189 Encounter for other specified aftercare: Secondary | ICD-10-CM

## 2013-05-19 DIAGNOSIS — C3490 Malignant neoplasm of unspecified part of unspecified bronchus or lung: Secondary | ICD-10-CM

## 2013-05-19 DIAGNOSIS — D702 Other drug-induced agranulocytosis: Secondary | ICD-10-CM

## 2013-05-19 DIAGNOSIS — C3410 Malignant neoplasm of upper lobe, unspecified bronchus or lung: Secondary | ICD-10-CM

## 2013-05-19 MED ORDER — TBO-FILGRASTIM 480 MCG/0.8ML ~~LOC~~ SOSY
480.0000 ug | PREFILLED_SYRINGE | Freq: Once | SUBCUTANEOUS | Status: AC
  Administered 2013-05-19: 480 ug via SUBCUTANEOUS
  Filled 2013-05-19: qty 0.8

## 2013-05-19 MED ORDER — PREGABALIN 50 MG PO CAPS: 50.0000 mg | ORAL_CAPSULE | Freq: Three times a day (TID) | ORAL | Status: DC

## 2013-05-19 MED ORDER — OXYCODONE-ACETAMINOPHEN 5-325 MG PO TABS: 1.0000 | ORAL_TABLET | Freq: Four times a day (QID) | ORAL | Status: DC | PRN

## 2013-05-19 NOTE — Telephone Encounter (Signed)
Pt called stating that his neuropathy is not getting better.  He is taking gabapentin 300mg  TID and has to take percocet twice daily.  He said it progressively gets worse through the day.  Per dr Vista Mink, okay to change to lyrica 50mg  TID.  SLJ

## 2013-05-19 NOTE — Telephone Encounter (Signed)
Pt called back, he stated "I changed my mind and would like the shots".  Injection appts ordered for today and tomorrow. Pt is aware of appts.  SLJ

## 2013-05-19 NOTE — Patient Instructions (Signed)
Pregabalin capsules What is this medicine? PREGABALIN (pre GAB a lin) is used to treat nerve pain from diabetes, shingles, spinal cord injury, and fibromyalgia. It is also used to control seizures in epilepsy. This medicine may be used for other purposes; ask your health care provider or pharmacist if you have questions. COMMON BRAND NAME(S): Lyrica What should I tell my health care provider before I take this medicine? They need to know if you have any of these conditions: -bleeding problems -heart disease, including heart failure -history of alcohol or drug abuse -kidney disease -suicidal thoughts, plans, or attempt; a previous suicide attempt by you or a family member -an unusual or allergic reaction to pregabalin, gabapentin, other medicines, foods, dyes, or preservatives -pregnant or trying to get pregnant or trying to conceive with your partner -breast-feeding How should I use this medicine? Take this medicine by mouth with a glass of water. Follow the directions on the prescription label. You can take this medicine with or without food. Take your doses at regular intervals. Do not take your medicine more often than directed. Do not stop taking except on your doctor's advice. A special MedGuide will be given to you by the pharmacist with each prescription and refill. Be sure to read this information carefully each time. Talk to your pediatrician regarding the use of this medicine in children. Special care may be needed. Overdosage: If you think you have taken too much of this medicine contact a poison control center or emergency room at once. NOTE: This medicine is only for you. Do not share this medicine with others. What if I miss a dose? If you miss a dose, take it as soon as you can. If it is almost time for your next dose, take only that dose. Do not take double or extra doses. What may interact with this medicine? -alcohol -certain medicines for blood pressure like captopril,  enalapril, or lisinopril -certain medicines for diabetes, like pioglitazone or rosiglitazone -certain medicines for anxiety or sleep -narcotic medicines for pain This list may not describe all possible interactions. Give your health care provider a list of all the medicines, herbs, non-prescription drugs, or dietary supplements you use. Also tell them if you smoke, drink alcohol, or use illegal drugs. Some items may interact with your medicine. What should I watch for while using this medicine? Tell your doctor or healthcare professional if your symptoms do not start to get better or if they get worse. Visit your doctor or health care professional for regular checks on your progress. Do not stop taking except on your doctor's advice. You may develop a severe reaction. Your doctor will tell you how much medicine to take. Wear a medical identification bracelet or chain if you are taking this medicine for seizures, and carry a card that describes your disease and details of your medicine and dosage times. You may get drowsy or dizzy. Do not drive, use machinery, or do anything that needs mental alertness until you know how this medicine affects you. Do not stand or sit up quickly, especially if you are an older patient. This reduces the risk of dizzy or fainting spells. Alcohol may interfere with the effect of this medicine. Avoid alcoholic drinks. If you have a heart condition, like congestive heart failure, and notice that you are retaining water and have swelling in your hands or feet, contact your health care provider immediately. The use of this medicine may increase the chance of suicidal thoughts or actions. Pay special attention   to how you are responding while on this medicine. Any worsening of mood, or thoughts of suicide or dying should be reported to your health care professional right away. This medicine has caused reduced sperm counts in some men. This may interfere with the ability to father a  child. You should talk to your doctor or health care professional if you are concerned about your fertility. Women who become pregnant while using this medicine for seizures may enroll in the [place] Pregnancy Registry by calling [phone]. This registry collects information about the safety of antiepileptic drug use during pregnancy. What side effects may I notice from receiving this medicine? Side effects that you should report to your doctor or health care professional as soon as possible: -allergic reactions like skin rash, itching or hives, swelling of the face, lips, or tongue -breathing problems -changes in vision -chest pain -confusion -jerking or unusual movements of any part of your body -loss of memory -muscle pain, tenderness, or weakness -suicidal thoughts or other mood changes -swelling of the ankles, feet, hands -unusual bruising or bleeding Side effects that usually do not require medical attention (Report these to your doctor or health care professional if they continue or are bothersome.): -dizziness -drowsiness -dry mouth -headache -nausea -tremors -trouble sleeping -weight gain This list may not describe all possible side effects. Call your doctor for medical advice about side effects. You may report side effects to FDA at 1-800-FDA-1088. Where should I keep my medicine? Keep out of the reach of children. This medicine can be abused. Keep your medicine in a safe place to protect it from theft. Do not share this medicine with anyone. Selling or giving away this medicine is dangerous and against the law. Store at room temperature between 15 and 30 degrees C (59 and 86 degrees F). Throw away any unused medicine after the expiration date. NOTE: This sheet is a summary. It may not cover all possible information. If you have questions about this medicine, talk to your doctor, pharmacist, or health care provider.  2014, Elsevier/Gold Standard.  (2010-06-29 20:00:36)   Tbo-Filgrastim injection What is this medicine? TBO-FILGRASTIM is used to help decrease the time you have low amounts of Schwertner blood cells after cancer treatment. It helps the body make more Heater blood cells. Increasing the amount of Cobbs blood cells helps to decrease the risk of infection and fever. This medicine may be used for other purposes; ask your health care provider or pharmacist if you have questions. COMMON BRAND NAME(S): Granix What should I tell my health care provider before I take this medicine? They need to know if you have any of these conditions: -history of blood diseases, like sickle cell anemia or leukemia -an unusual or allergic reaction to tbo-filgrastim, filgrastim, pegfilgrastim, other medicines, foods, dyes, or preservatives -pregnant or trying to get pregnant -breast-feeding How should I use this medicine? This medicine is for injection under the skin. It is given by a health care professional in a hospital or clinic setting. Talk to your pediatrician regarding the use of this medicine in children. Special care may be needed. Overdosage: If you think you've taken too much of this medicine contact a poison control center or emergency room at once. Overdosage: If you think you have taken too much of this medicine contact a poison control center or emergency room at once. NOTE: This medicine is only for you. Do not share this medicine with others. What if I miss a dose? Keep appointments for  follow-up doses as directed. It is important not to miss your dose. Call your doctor or health care professional if you are unable to keep an appointment. What may interact with this medicine? -lithium This list may not describe all possible interactions. Give your health care provider a list of all the medicines, herbs, non-prescription drugs, or dietary supplements you use. Also tell them if you smoke, drink alcohol, or use illegal drugs. Some items may  interact with your medicine. What should I watch for while using this medicine? Your condition will be monitored carefully while you are receiving this medicine. You may need blood work done while you are taking this medicine. Avoid taking products that contain aspirin, acetaminophen, ibuprofen, naproxen, or ketoprofen unless instructed by your doctor. These medicines may hide a fever. Call your doctor or health care professional for advice if you get a fever, chills or sore throat, or other symptoms of a cold or flu. Do not treat yourself. What side effects may I notice from receiving this medicine? Side effects that you should report to your doctor or health care professional as soon as possible: -allergic reactions like skin rash, itching or hives, swelling of the face, lips, or tongue -breathing problems -fever -stomach pain  Side effects that usually do not require medical attention (Report these to your doctor or health care professional if they continue or are bothersome.): -bone pain This list may not describe all possible side effects. Call your doctor for medical advice about side effects. You may report side effects to FDA at 1-800-FDA-1088. Where should I keep my medicine? This drug is given in a hospital or clinic and will not be stored at home. NOTE: This sheet is a summary. It may not cover all possible information. If you have questions about this medicine, talk to your doctor, pharmacist, or health care provider.  2014, Elsevier/Gold Standard. (2010-09-13 16:41:24)

## 2013-05-19 NOTE — Telephone Encounter (Signed)
CT of the chest is approved Case # [id number], Auth # [id number].  Called and left msg with patient.  SLJ

## 2013-05-19 NOTE — Telephone Encounter (Signed)
ANC 0.6, WBC 1.9 lab work done 05/18/13.  Per Dr Vista Mink, Pt may choose either neutropenic pxns or neutropenic pxns and 2 days of neupogen.  Pt prefers to just do neutropenic pxns for now.  He comes back next week for lab work and he is to call if anything changes.  SLJ

## 2013-05-20 ENCOUNTER — Encounter: Payer: Self-pay | Admitting: Internal Medicine

## 2013-05-20 ENCOUNTER — Telehealth: Payer: Self-pay | Admitting: Medical Oncology

## 2013-05-20 ENCOUNTER — Ambulatory Visit (HOSPITAL_BASED_OUTPATIENT_CLINIC_OR_DEPARTMENT_OTHER): Payer: Medicare HMO

## 2013-05-20 ENCOUNTER — Encounter: Payer: Self-pay | Admitting: *Deleted

## 2013-05-20 VITALS — BP 124/77 | HR 97 | Temp 98.1°F

## 2013-05-20 DIAGNOSIS — C3430 Malignant neoplasm of lower lobe, unspecified bronchus or lung: Secondary | ICD-10-CM

## 2013-05-20 DIAGNOSIS — Z5189 Encounter for other specified aftercare: Secondary | ICD-10-CM

## 2013-05-20 DIAGNOSIS — D702 Other drug-induced agranulocytosis: Secondary | ICD-10-CM

## 2013-05-20 MED ORDER — TBO-FILGRASTIM 480 MCG/0.8ML ~~LOC~~ SOSY
480.0000 ug | PREFILLED_SYRINGE | Freq: Once | SUBCUTANEOUS | Status: AC
  Administered 2013-05-20: 480 ug via SUBCUTANEOUS
  Filled 2013-05-20: qty 0.8

## 2013-05-20 NOTE — Progress Notes (Signed)
Received prior authorization request from [facility] for Lyrica 50 mg capsules. Forwarded to managed care.

## 2013-05-20 NOTE — Telephone Encounter (Signed)
Pt does not want Lyrica.Marland Kitchen He will stay on neurontin 300 mg 3 tabs tid. Pt states his last rx was 4/19 #180.  I called pharmacy and they have a rx from Dr Bubba Camp that was not filled. I requested that they fill it per pt request.Pt notified.

## 2013-05-20 NOTE — Progress Notes (Signed)
Faxed lyrica pa form to [employer]

## 2013-05-25 ENCOUNTER — Ambulatory Visit (HOSPITAL_COMMUNITY)
Admission: RE | Admit: 2013-05-25 | Discharge: 2013-05-25 | Disposition: A | Discharge status: Home / Self Care | Payer: Medicare HMO | Source: Ambulatory Visit | Attending: Internal Medicine | Admitting: Internal Medicine

## 2013-05-25 DIAGNOSIS — C3400 Malignant neoplasm of unspecified main bronchus: Secondary | ICD-10-CM | POA: Insufficient documentation

## 2013-05-25 DIAGNOSIS — C3490 Malignant neoplasm of unspecified part of unspecified bronchus or lung: Secondary | ICD-10-CM

## 2013-05-25 MED ORDER — IOHEXOL 300 MG/ML  SOLN
80.0000 mL | Freq: Once | INTRAMUSCULAR | Status: AC | PRN
  Administered 2013-05-25: 80 mL via INTRAVENOUS

## 2013-05-26 ENCOUNTER — Telehealth: Payer: Self-pay | Admitting: Internal Medicine

## 2013-05-26 ENCOUNTER — Ambulatory Visit (HOSPITAL_BASED_OUTPATIENT_CLINIC_OR_DEPARTMENT_OTHER): Payer: Medicare HMO | Admitting: Internal Medicine

## 2013-05-26 ENCOUNTER — Encounter: Payer: Self-pay | Admitting: Internal Medicine

## 2013-05-26 ENCOUNTER — Other Ambulatory Visit (HOSPITAL_BASED_OUTPATIENT_CLINIC_OR_DEPARTMENT_OTHER): Payer: Medicare HMO

## 2013-05-26 VITALS — BP 132/82 | HR 108 | Temp 96.9°F | Resp 18 | Ht 73.0 in | Wt 195.3 lb

## 2013-05-26 DIAGNOSIS — C3490 Malignant neoplasm of unspecified part of unspecified bronchus or lung: Secondary | ICD-10-CM

## 2013-05-26 DIAGNOSIS — C3430 Malignant neoplasm of lower lobe, unspecified bronchus or lung: Secondary | ICD-10-CM

## 2013-05-26 DIAGNOSIS — G609 Hereditary and idiopathic neuropathy, unspecified: Secondary | ICD-10-CM

## 2013-05-26 LAB — CBC WITH DIFFERENTIAL/PLATELET
BASO%: 0.5 % (ref 0.0–2.0)
Basophils Absolute: 0 10*3/uL (ref 0.0–0.1)
EOS%: 1.5 % (ref 0.0–7.0)
Eosinophils Absolute: 0.1 10*3/uL (ref 0.0–0.5)
HCT: 32.6 % — ABNORMAL LOW (ref 38.4–49.9)
HGB: 10.6 g/dL — ABNORMAL LOW (ref 13.0–17.1)
LYMPH#: 0.9 10*3/uL (ref 0.9–3.3)
LYMPH%: 12.2 % — ABNORMAL LOW (ref 14.0–49.0)
MCH: 30.8 pg (ref 27.2–33.4)
MCHC: 32.5 g/dL (ref 32.0–36.0)
MCV: 94.8 fL (ref 79.3–98.0)
MONO#: 0.7 10*3/uL (ref 0.1–0.9)
MONO%: 9.9 % (ref 0.0–14.0)
NEUT#: 5.4 10*3/uL (ref 1.5–6.5)
NEUT%: 75.9 % — ABNORMAL HIGH (ref 39.0–75.0)
Platelets: 336 10*3/uL (ref 140–400)
RBC: 3.44 10*6/uL — ABNORMAL LOW (ref 4.20–5.82)
RDW: 16.9 % — ABNORMAL HIGH (ref 11.0–14.6)
WBC: 7.1 10*3/uL (ref 4.0–10.3)

## 2013-05-26 LAB — COMPREHENSIVE METABOLIC PANEL (CC13)
ALT: 12 U/L (ref 0–55)
AST: 13 U/L (ref 5–34)
Albumin: 3.2 g/dL — ABNORMAL LOW (ref 3.5–5.0)
Alkaline Phosphatase: 78 U/L (ref 40–150)
Anion Gap: 13 mEq/L — ABNORMAL HIGH (ref 3–11)
BUN: 20.1 mg/dL (ref 7.0–26.0)
CO2: 22 mEq/L (ref 22–29)
Calcium: 9.6 mg/dL (ref 8.4–10.4)
Chloride: 103 mEq/L (ref 98–109)
Creatinine: 1 mg/dL (ref 0.7–1.3)
Glucose: 178 mg/dl — ABNORMAL HIGH (ref 70–140)
POTASSIUM: 4.1 meq/L (ref 3.5–5.1)
Sodium: 139 mEq/L (ref 136–145)
Total Bilirubin: 0.32 mg/dL (ref 0.20–1.20)
Total Protein: 7.5 g/dL (ref 6.4–8.3)

## 2013-05-26 MED ORDER — METHYLPREDNISOLONE (PAK) 4 MG PO TABS: ORAL_TABLET | ORAL | Status: DC

## 2013-05-26 NOTE — Telephone Encounter (Signed)
Gave pt appt for lab and Md for August 2015

## 2013-05-26 NOTE — Progress Notes (Signed)
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   Primary site: Lung (Right)  Staging method: AJCC 7th Edition  Clinical: Stage IIIA (T2b, N2, M0)  Summary: Stage IIIA (T2b, N2, M0)   PRIOR THERAPY:  1) Concurrent chemoradiation with weekly carboplatin for an AUC of 2 and paclitaxel 45 mg/m2, status post 4 cycles. 2) Consolidation chemotherapy with carboplatin for AUC of 5 and paclitaxel 175 mg/M2 every 3 weeks with Neulasta support, first cycle on 03/23/2013. Status post 3 cycles.  CURRENT THERAPY: Observation.  DISEASE STAGE:  Lung cancer  Primary site: Lung (Right)  Staging method: AJCC 7th Edition  Clinical: Stage IIIA (T2b, N2, M0)  Summary: Stage IIIA (T2b, N2, M0)  CHEMOTHERAPY INTENT: Control/Palliative  CURRENT # OF CHEMOTHERAPY CYCLES: 3 CURRENT ANTIEMETICS: Zofran, dexamethasone, Compazine  CURRENT SMOKING STATUS: Former smoker, quit 01/09/2007  ORAL CHEMOTHERAPY AND CONSENT: n/a  CURRENT BISPHOSPHONATES USE: none  PAIN MANAGEMENT: Ibuprofen  NARCOTICS INDUCED CONSTIPATION: None  LIVING WILL AND CODE STATUS: ?  INTERVAL HISTORY: Steve [name] 73 y.o. male returns to the clinic today for followup visit. 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 He is currently on Neurontin 300 mg by mouth 3 times a day. We tried switching to lytic back his insurance did not cover it. He tolerated the last cycle of consolidation chemotherapy fairly well except for the peripheral neuropathy. He did not receive Neulasta injection after this cycle, but he had 3 days of Neupogen injection because of neutropenia. He continues to have wheezes and dry  cough as well as shortness of breath with exertion. He denied having any weight loss or night sweats. The patient has no fever or chills and no nausea or vomiting. He has repeat CT scan of the chest performed he said he is here for evaluation and discussion of his scan results.   MEDICAL HISTORY: Past Medical History  Diagnosis Date  . Dizziness and giddiness     positional vertigo  . Malignant neoplasm of bladder, part unspecified   . Hyperlipidemia   . Impotence of organic origin   . Osteoarthritis, shoulder   . Allergy   . Hypothyroidism     pt denies thryoid disease, dr pandey note 11-19-12 says subclinical hypothryoid in epic  . COPD (chronic obstructive pulmonary disease)   . Diabetes mellitus without complication     pt denies diabetes, dr pandey note says dm 11-19-12 epic  . Malignant neoplasm of bronchus and lung, unspecified site     ALLERGIES:  is allergic to morphine and related.  MEDICATIONS:  Current Outpatient Prescriptions  Medication Sig Dispense Refill  . aspirin 325 MG tablet Take 325 mg by mouth daily.      Marland Kitchen diltiazem (CARDIZEM CD) 180 MG 24 hr capsule Take 1 capsule (180 mg total) by mouth daily.  90 capsule  3  . gabapentin (NEURONTIN) 300 MG capsule TAKE ONE CAPSULE BY MOUTH THREE TIMES DAILY  270 capsule  0  . omeprazole (PRILOSEC) 20 MG capsule       . oxyCODONE-acetaminophen (ROXICET) 5-325 MG per tablet Take 1 tablet by mouth every 6 (six) hours as needed for severe pain.  40 tablet  0  . simvastatin (ZOCOR) 20 MG tablet Take one tablet by mouth once daily to lower cholesterol  90 tablet  3  . temazepam (RESTORIL) 30 MG capsule Take 1 capsule (30 mg total) by mouth at bedtime as needed for sleep.  30 capsule  0   No current facility-administered medications for this visit.    SURGICAL HISTORY:  Past Surgical History  Procedure Laterality Date  . Cystoscopy w/ dilation of bladder    . Tonsillectomy  age 43  . Endobronchial ultrasound Bilateral  12/15/2012    Procedure: ENDOBRONCHIAL ULTRASOUND;  Surgeon: Brand Males, MD;  Location: [facility];  Service: Cardiopulmonary;  Laterality: Bilateral;  . Colonoscopy  2009    [facility]    REVIEW OF SYSTEMS:  Constitutional: negative Eyes: negative Ears, nose, mouth, throat, and face: negative Respiratory: positive for cough, dyspnea on exertion and wheezing Cardiovascular: negative Gastrointestinal: negative Genitourinary:negative Integument/breast: negative Hematologic/lymphatic: negative Musculoskeletal:negative Neurological: positive for paresthesia Behavioral/Psych: negative Endocrine: negative Allergic/Immunologic: negative   PHYSICAL EXAMINATION: General appearance: alert, cooperative and no distress Head: Normocephalic, without obvious abnormality, atraumatic Neck: no adenopathy, no JVD, supple, symmetrical, trachea midline and thyroid not enlarged, symmetric, no tenderness/mass/nodules Lymph nodes: Cervical, supraclavicular, and axillary nodes normal. Resp: clear to auscultation bilaterally Back: symmetric, no curvature. ROM normal. No CVA tenderness. Cardio: regular rate and rhythm, S1, S2 normal, no murmur, click, rub or gallop GI: soft, non-tender; bowel sounds normal; no masses,  no organomegaly Extremities: extremities normal, atraumatic, no cyanosis or edema Neurologic: Alert and oriented X 3, normal strength and tone. Normal symmetric reflexes. Normal coordination and gait  ECOG PERFORMANCE STATUS: 1 - Symptomatic but completely ambulatory  Blood pressure 132/82, pulse 108, temperature 96.9 F (36.1 C), temperature source Oral, resp. rate 18, height 6\' 1"  (1.854 m), weight 195 lb 4.8 oz (88.587 kg).  LABORATORY DATA: Lab Results  Component Value Date   WBC 7.1 05/26/2013   HGB 10.6* 05/26/2013   HCT 32.6* 05/26/2013   MCV 94.8 05/26/2013   PLT 336 05/26/2013      Chemistry      Component Value Date/Time   NA 142 05/18/2013 1322   NA 142 04/28/2013  0819   K 3.8 05/18/2013 1322   K 5.1 04/28/2013 0819   CL 103 04/28/2013 0819   CO2 26 05/18/2013 1322   CO2 24 04/28/2013 0819   BUN 11.1 05/18/2013 1322   BUN 12 04/28/2013 0819   CREATININE 0.7 05/18/2013 1322   CREATININE 0.87 04/28/2013 0819      Component Value Date/Time   CALCIUM 9.6 05/18/2013 1322   CALCIUM 9.9 04/28/2013 0819   ALKPHOS 66 05/18/2013 1322   ALKPHOS 107 04/28/2013 0819   AST 13 05/18/2013 1322   AST 19 04/28/2013 0819   ALT 13 05/18/2013 1322   ALT 12 04/28/2013 0819   BILITOT 0.30 05/18/2013 1322   BILITOT 0.4 04/28/2013 0819       RADIOGRAPHIC STUDIES: Ct Chest W Contrast  05/25/2013   CLINICAL DATA:  Lung cancer, chemotherapy and radiation therapy complete. Shortness of breath.  EXAM: CT CHEST WITH CONTRAST  TECHNIQUE: Multidetector CT imaging of the chest was performed during intravenous contrast administration.  CONTRAST:  27mL OMNIPAQUE IOHEXOL 300 MG/ML  SOLN  COMPARISON:  03/17/2013.  FINDINGS: 10 mm right paratracheal lymph node is stable. 12 mm right hilar lymph node is also stable. Subcarinal lymph node measures 2.1 cm, stable. Right infrahilar mass measures approximately 3.2 x 4.6 cm and is likely stable.  No axillary adenopathy. Atherosclerotic calcification of the arterial vasculature, including extensive three-vessel involvement of the coronary arteries. Heart size normal. No pericardial effusion.  Consolidation, ground-glass, bronchiectasis and mild architectural distortion have progressed in the medial aspect of the right hemi thorax. Findings are worst in the right lower lobe. No pleural fluid. There is narrowing of the right middle and right lower lobe bronchi. Airway is otherwise unremarkable.  Incidental imaging of the upper abdomen shows a 7 mm low-attenuation lesion in the left hepatic lobe, unchanged. A faint blush of hyperattenuation in the right hepatic lobe (axial image 54) is nonspecific. Visualized portions of the liver, gallbladder, adrenal glands,  kidneys, spleen, pancreas, stomach and bowel are otherwise grossly unremarkable. No upper abdominal adenopathy. No worrisome lytic or sclerotic lesions. Degenerative changes and flowing anterior osteophytosis are seen in the spine.  IMPRESSION: 1. Mediastinal/right hilar adenopathy and right infrahilar mass are stable. 2. Increasing consolidation and ground-glass in the medial aspect of the right hemi thorax, with associated bronchiectasis and mild architectural distortion. Findings are worst in the right lower lobe and presumably represent evolutionary changes of radiation therapy. 3. Faint blush of hyperattenuation in the right hepatic lobe is nonspecific. Continued attention on followup exams is warranted.   Electronically Signed   By: Lorin Picket M.D.   On: 05/25/2013 16:20   ASSESSMENT AND PLAN: This is a very pleasant 73 years old [place] male with history of stage IIIA non-small cell lung cancer who completed a course of concurrent chemoradiation with weekly carboplatin and paclitaxel with no significant complaints except for radiation induced odynophagia.  He also completed 3 cycles of consolidation chemotherapy with reduced dose carboplatin and paclitaxel. His recent CT scan of the chest showed no evidence for disease progression. I discussed the scan results and showed the images to the patient and his wife. I recommended for him to continue on observation with repeat CT scan of the chest in 3 month. For the peripheral neuropathy, he'll continue on Neurontin 300 mg by mouth 3 times a day. 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 He was advised to call immediately if he has any concerning symptoms in the interval.  The patient voices understanding of current disease status and treatment options and is in agreement with the current care plan.  All questions were answered. The patient knows to  call the clinic with any problems, questions or concerns. We can certainly see the patient much sooner if necessary.  Disclaimer: This note was dictated with voice recognition software. Similar sounding words can inadvertently be transcribed and may not be corrected upon review.

## 2013-06-03 ENCOUNTER — Other Ambulatory Visit: Payer: Self-pay | Admitting: Medical Oncology

## 2013-06-03 DIAGNOSIS — C3490 Malignant neoplasm of unspecified part of unspecified bronchus or lung: Secondary | ICD-10-CM

## 2013-06-03 MED ORDER — TEMAZEPAM 30 MG PO CAPS: 30.0000 mg | ORAL_CAPSULE | Freq: Every evening | ORAL | Status: DC | PRN

## 2013-06-03 MED ORDER — OXYCODONE-ACETAMINOPHEN 5-325 MG PO TABS: 1.0000 | ORAL_TABLET | Freq: Four times a day (QID) | ORAL | Status: DC | PRN

## 2013-06-04 ENCOUNTER — Encounter: Payer: Self-pay | Admitting: Internal Medicine

## 2013-06-04 ENCOUNTER — Telehealth: Payer: Self-pay | Admitting: *Deleted

## 2013-06-04 DIAGNOSIS — C3490 Malignant neoplasm of unspecified part of unspecified bronchus or lung: Secondary | ICD-10-CM

## 2013-06-04 MED ORDER — PREGABALIN 50 MG PO CAPS: 50.0000 mg | ORAL_CAPSULE | Freq: Three times a day (TID) | ORAL | Status: DC

## 2013-06-04 NOTE — Telephone Encounter (Signed)
Received notification from [facility] that Prior Authorization is required for the Lyrica. Forwarded to managed care department.

## 2013-06-04 NOTE — Progress Notes (Signed)
[name] [id number] denied lyrica because they believe the patient needs to try and fail cymbalta; I explained the form asked if he tried neurontin, not cymbalta, and I put he did and failed it.

## 2013-06-04 NOTE — Telephone Encounter (Signed)
Spoke with patient.  He is requesting something different besides gabapentin.  Per Dr Vista Mink, okay to try lyrica 50mg  TID.  Dr Vista Mink is not sure if insurance will cover it.  We are going to try to call it in and he is to call if insurance will not cover.  SLJ

## 2013-06-04 NOTE — Telephone Encounter (Signed)
Message copied by Britt Bottom on Thu Jun 04, 2013 12:17 PM ------      Message from: Ardeen Garland      Created: Wed Jun 03, 2013  5:08 PM       Taking neurontin 900 mg/day and not helping ? Anything else? ------

## 2013-06-05 ENCOUNTER — Telehealth: Payer: Self-pay | Admitting: *Deleted

## 2013-06-05 NOTE — Telephone Encounter (Signed)
Prior authorization from [employer] for Lyrica given to Steve [name] in medical mgmt to review.  SLJ

## 2013-06-08 NOTE — Telephone Encounter (Signed)
PRIOR AUTHORIZATION FOR LYRICA HAS BEEN DENIED. DR.MOHAMED IS OUT OF THE OFFICE UNTIL Wednesday. HIS NURSE, [place] WILL NOTIFY [id number] OF THE DENIAL ON Wednesday. NOTIFIED [facility].

## 2013-06-08 NOTE — Telephone Encounter (Signed)
Called and informed patient regarding current rx status.  He verbalized understanding.  Note left with Dr Vista Mink, will forward info to [place], RN.

## 2013-06-18 NOTE — Telephone Encounter (Signed)
Pt's insurance will not cover lyrica unless pt tries and fails cymbalta.  Per Dr Vista Mink, he will not prescribe cymbalta as an alternative to lyrica.  SLJ

## 2013-06-26 ENCOUNTER — Other Ambulatory Visit: Payer: Self-pay | Admitting: Medical Oncology

## 2013-06-26 DIAGNOSIS — C3490 Malignant neoplasm of unspecified part of unspecified bronchus or lung: Secondary | ICD-10-CM

## 2013-06-26 MED ORDER — TEMAZEPAM 30 MG PO CAPS: 30.0000 mg | ORAL_CAPSULE | Freq: Every evening | ORAL | Status: DC | PRN

## 2013-06-26 MED ORDER — OXYCODONE-ACETAMINOPHEN 5-325 MG PO TABS: 1.0000 | ORAL_TABLET | Freq: Four times a day (QID) | ORAL | Status: DC | PRN

## 2013-06-26 NOTE — Telephone Encounter (Signed)
States neuropathy is a little better -( He did not start Lyrica due to insurance denial.). Refills per dr Julien Nordmann and locked in injection room.

## 2013-07-15 ENCOUNTER — Ambulatory Visit (INDEPENDENT_AMBULATORY_CARE_PROVIDER_SITE_OTHER): Payer: Medicare HMO | Admitting: Internal Medicine

## 2013-07-15 ENCOUNTER — Encounter: Payer: Self-pay | Admitting: Internal Medicine

## 2013-07-15 VITALS — BP 110/70 | HR 104 | Temp 98.0°F | Wt 192.4 lb

## 2013-07-15 DIAGNOSIS — J45909 Unspecified asthma, uncomplicated: Secondary | ICD-10-CM

## 2013-07-15 DIAGNOSIS — Z5181 Encounter for therapeutic drug level monitoring: Secondary | ICD-10-CM

## 2013-07-15 DIAGNOSIS — M19019 Primary osteoarthritis, unspecified shoulder: Secondary | ICD-10-CM

## 2013-07-15 DIAGNOSIS — E1142 Type 2 diabetes mellitus with diabetic polyneuropathy: Secondary | ICD-10-CM

## 2013-07-15 DIAGNOSIS — M19011 Primary osteoarthritis, right shoulder: Secondary | ICD-10-CM

## 2013-07-15 DIAGNOSIS — E1149 Type 2 diabetes mellitus with other diabetic neurological complication: Secondary | ICD-10-CM

## 2013-07-15 DIAGNOSIS — R062 Wheezing: Secondary | ICD-10-CM

## 2013-07-15 DIAGNOSIS — G4700 Insomnia, unspecified: Secondary | ICD-10-CM | POA: Insufficient documentation

## 2013-07-15 DIAGNOSIS — I4891 Unspecified atrial fibrillation: Secondary | ICD-10-CM

## 2013-07-15 DIAGNOSIS — J4530 Mild persistent asthma, uncomplicated: Secondary | ICD-10-CM

## 2013-07-15 DIAGNOSIS — M19012 Primary osteoarthritis, left shoulder: Secondary | ICD-10-CM

## 2013-07-15 DIAGNOSIS — C3490 Malignant neoplasm of unspecified part of unspecified bronchus or lung: Secondary | ICD-10-CM

## 2013-07-15 MED ORDER — TETANUS-DIPHTH-ACELL PERTUSSIS 5-2.5-18.5 LF-MCG/0.5 IM SUSP: 0.5000 mL | Freq: Once | INTRAMUSCULAR | Status: DC

## 2013-07-15 MED ORDER — ZOSTER VACCINE LIVE 19400 UNT/0.65ML ~~LOC~~ SOLR: 0.6500 mL | Freq: Once | SUBCUTANEOUS | Status: DC

## 2013-07-15 MED ORDER — ZOLPIDEM TARTRATE 5 MG PO TABS: ORAL_TABLET | ORAL | Status: DC

## 2013-07-15 MED ORDER — OXYCODONE-ACETAMINOPHEN 5-325 MG PO TABS: 1.0000 | ORAL_TABLET | Freq: Four times a day (QID) | ORAL | Status: DC | PRN

## 2013-07-15 NOTE — Progress Notes (Signed)
Patient ID: Steve [name], male   DOB: 03-09-1940, 73 y.o.   MRN: [id number]    Chief Complaint  Patient presents with  . Follow-up    f/u & medication refills   Allergies  Allergen Reactions  . Morphine And Related    HPI 73 y/o male patient is here for follow up visit. 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 He has completed his chemo and radiation therapy and has follow up with repeat ct chest in august.  He has been having cough with wheezing on and off for several months but mentions it is getting more bothersome recently He has bouts of cough Denies any phlegm or minimal clear [name] phlegm No hemoptysis He has wheezing at night when he lies down  Denies any orthopnea or PND Gets short winded easily with exertion He has been having pain in his feet with walking. Denies any claudication. Is taking gabapentin, lyrica script not covered by insurance He follows with oncology. He is having trouble falling asleep and staying asleep. He has been on restoril and this is not helping him He does not want to try any breathing treatment- no inhaler. He was supposed to see pulmonary in jan but is lost to follow up He has stopped taking his diltiazem and mentions denies palpitations  Review of Systems  Constitutional: Negative for fever, chills, diaphoresis.  HENT: Negative for congestion, hearing loss and sore throat.   Eyes: Negative for blurred vision, double vision and discharge.  Respiratory: see hpi Cardiovascular: Negative for chest pain, palpitations, orthopnea and leg swelling.  Gastrointestinal: Negative for heartburn, nausea, vomiting, abdominal pain, diarrhea and constipation.  Genitourinary: Negative for dysuria, urgency, frequency and flank pain.  Musculoskeletal: Negative for back pain. His oxycodone is helping with pain. Gabapentin helpful to some extent only Skin: Negative for itching and rash.  Neurological: Negative for  dizziness, focal weakness and headaches.  Psychiatric/Behavioral: Negative for depression. The patient is not nervous/anxious.    Past Medical History  Diagnosis Date  . Dizziness and giddiness     positional vertigo  . Malignant neoplasm of bladder, part unspecified   . Hyperlipidemia   . Impotence of organic origin   . Osteoarthritis, shoulder   . Allergy   . Hypothyroidism     pt denies thryoid disease, dr Steve [name] note 11-19-12 says subclinical hypothryoid in epic  . COPD (chronic obstructive pulmonary disease)   . Diabetes mellitus without complication     pt denies diabetes, dr Steve [name] note says dm 11-19-12 epic  . Malignant neoplasm of bronchus and lung, unspecified site    Current Outpatient Prescriptions on File Prior to Visit  Medication Sig Dispense Refill  . aspirin 325 MG tablet Take 325 mg by mouth daily.      Marland Kitchen gabapentin (NEURONTIN) 300 MG capsule TAKE ONE CAPSULE BY MOUTH THREE TIMES DAILY  270 capsule  0  . simvastatin (ZOCOR) 20 MG tablet Take one tablet by mouth once daily to lower cholesterol  90 tablet  3   No current facility-administered medications on file prior to visit.   Physical exam BP 110/70  Pulse 104  Temp(Src) 98 F (36.7 C) (Oral)  Wt 192 lb 6.4 oz (87.272 kg)  SpO2 98%  General- elderly male in no acute distress Head- atraumatic, normocephalic Eyes- PERRLA, EOMI, no pallor, no icterus Neck- no lymphadenopathy, clear oropharynx Cardiovascular- normal s1,s2, no murmurs Respiratory- bilateral poor expiration with wheezing, no rhonchi  or crackles  Abdomen- bowel sounds present, soft, non tender Musculoskeletal- able to move all 4 extremities, no use of assistive device Neurological- no focal deficit, normal reflexes, normal muscle strength, normal sensation to fine touch and vibration, normal foot exam Psychiatry- alert and oriented to person, place and time, normal mood and affect  Imaging Ct Chest W Contrast  05/25/2013   CLINICAL DATA:   Lung cancer, chemotherapy and radiation therapy complete. Shortness of breath.  EXAM: CT CHEST WITH CONTRAST  TECHNIQUE: Multidetector CT imaging of the chest was performed during intravenous contrast administration.  CONTRAST:  24mL OMNIPAQUE IOHEXOL 300 MG/ML  SOLN  COMPARISON:  03/17/2013.  FINDINGS: 10 mm right paratracheal lymph node is stable. 12 mm right hilar lymph node is also stable. Subcarinal lymph node measures 2.1 cm, stable. Right infrahilar mass measures approximately 3.2 x 4.6 cm and is likely stable. No axillary adenopathy. Atherosclerotic calcification of the arterial vasculature, including extensive three-vessel involvement of the coronary arteries. Heart size normal. No pericardial effusion.  Consolidation, ground-glass, bronchiectasis and mild architectural distortion have progressed in the medial aspect of the right hemi thorax. Findings are worst in the right lower lobe. No pleural fluid. There is narrowing of the right middle and right lower lobe bronchi. Airway is otherwise unremarkable.  Incidental imaging of the upper abdomen shows a 7 mm low-attenuation lesion in the left hepatic lobe, unchanged. A faint blush of hyperattenuation in the right hepatic lobe (axial image 54) is nonspecific. Visualized portions of the liver, gallbladder, adrenal glands, kidneys, spleen, pancreas, stomach and bowel are otherwise grossly unremarkable. No upper abdominal adenopathy. No worrisome lytic or sclerotic lesions. Degenerative changes and flowing anterior osteophytosis are seen in the spine.  IMPRESSION: 1. Mediastinal/right hilar adenopathy and right infrahilar mass are stable. 2. Increasing consolidation and ground-glass in the medial aspect of the right hemi thorax, with associated bronchiectasis and mild architectural distortion. Findings are worst in the right lower lobe and presumably represent evolutionary changes of radiation therapy. 3. Faint blush of hyperattenuation in the right hepatic lobe  is nonspecific. Continued attention on followup exams is warranted.   Electronically Signed   By: Lorin Picket M.D.   On: 05/25/2013 16:20   Assessment/plan  1. Malignant neoplasm of bronchus and lung, unspecified site S/p chemo and radiation. Has follow up with oncology in august - oxyCODONE-acetaminophen (ROXICET) 5-325 MG per tablet; Take 1 tablet by mouth every 6 (six) hours as needed for severe pain.  Dispense: 40 tablet; Refill: 0 - Ambulatory referral to Pulmonology  2. Wheezing Pt refusing any bronchodilator treatment at present. Has lung mass with cancer. This could be from reactive airway disease post obstruction. Will have him see pulmonary for PFT.  - Ambulatory referral to Pulmonology  3. DM type 2 with diabetic peripheral neuropathy Recheck a1c prior to next visit. Off all med at present. Normal foot exam. Check urine microalbumin prior to next visit.check lipid panel and kidney function. Continue gabapentin and statim - CMP; Future - Lipid Panel; Future - CBC with Differential; Future - Hemoglobin A1c; Future  4. Primary osteoarthritis of both shoulders Prn oxycodone helpful at present  5. Reactive airway disease, mild persistent, uncomplicated See above # 2   6. Atrial fibrillation, unspecified Rate controlled. Off all rate controlling agents. Continue aspirin  Script for tdap and zoster vaccine provided  7 v58.83 Genetic testing performed  8. Insomnia Will d/c restoril and have him on ambien 5 mg daily as needed for sleep. Can go upto  2 tablet if needed

## 2013-07-17 ENCOUNTER — Encounter: Payer: Self-pay | Admitting: *Deleted

## 2013-07-17 ENCOUNTER — Telehealth: Payer: Self-pay | Admitting: *Deleted

## 2013-07-17 NOTE — Telephone Encounter (Signed)
[facility] sent fax for Prior Authorization for Ambien. Called [phone] and spoke with Baldo Ash and received approval till 01/07/2014 for Ambien. Notified Patient. Patient ID# [facility] CASE # [id number]

## 2013-07-20 ENCOUNTER — Telehealth: Payer: Self-pay | Admitting: *Deleted

## 2013-07-20 NOTE — Telephone Encounter (Signed)
Patient called and stated that the Ambien is not working and wants to know if he can increase it to 2 a day. Please Advise.

## 2013-07-20 NOTE — Telephone Encounter (Signed)
Ok to take 10 mg po daily at bedtime for sleep. He can take 2 of 5 mg daily until he runs out of current supply and then we can give new script for 10 mg daily.

## 2013-07-21 MED ORDER — SIMVASTATIN 20 MG PO TABS: ORAL_TABLET | ORAL | Status: DC

## 2013-07-21 MED ORDER — ZOLPIDEM TARTRATE 5 MG PO TABS: ORAL_TABLET | ORAL | Status: DC

## 2013-07-21 NOTE — Telephone Encounter (Signed)
Patient notified and agreed to try Ambien 2 at bedtime. Patient also wanted Simvastatin called into pharmacy. Faxed.

## 2013-07-28 ENCOUNTER — Encounter: Payer: Self-pay | Admitting: Internal Medicine

## 2013-07-28 NOTE — Progress Notes (Signed)
Gave instructions to him for billing at [facility]. He said he got a call frm agency and he said he is on a pmt plan.

## 2013-07-29 ENCOUNTER — Ambulatory Visit: Payer: Medicare HMO | Admitting: Internal Medicine

## 2013-07-30 MED ORDER — ZOLPIDEM TARTRATE 10 MG PO TABS: ORAL_TABLET | ORAL | Status: DC

## 2013-07-30 NOTE — Addendum Note (Signed)
Addended by: Rafael Bihari A on: 07/30/2013 11:00 AM   Modules accepted: Orders, Medications

## 2013-07-30 NOTE — Telephone Encounter (Signed)
Patient called and stated that the Ambein 10mg  works and wants a Rx faxed to his pharmacy. Printed and faxed. Patient Notified.

## 2013-08-13 ENCOUNTER — Other Ambulatory Visit: Payer: Self-pay | Admitting: *Deleted

## 2013-08-13 DIAGNOSIS — C3490 Malignant neoplasm of unspecified part of unspecified bronchus or lung: Secondary | ICD-10-CM

## 2013-08-13 MED ORDER — OXYCODONE-ACETAMINOPHEN 5-325 MG PO TABS: 1.0000 | ORAL_TABLET | Freq: Four times a day (QID) | ORAL | Status: DC | PRN

## 2013-08-13 NOTE — Telephone Encounter (Signed)
Patient questioned about taking his Gabapentin and wanted to know if it would be ok to take two in the morning and one at bedtime because he forgets to take it at lunchtime. I spoke with Janett Billow and she stated that this was fine to do. Patient Notified.

## 2013-08-14 ENCOUNTER — Telehealth: Payer: Self-pay | Admitting: Medical Oncology

## 2013-08-14 NOTE — Telephone Encounter (Signed)
He is getting a bill from [facility] for "neulasta injections"  and he said he received a grant through our  managed care department who got him  A grant  $4000  to help with cost of this medication. He does not know what to do with bill. Sent to Managed care.

## 2013-08-25 ENCOUNTER — Encounter (HOSPITAL_COMMUNITY): Payer: Self-pay

## 2013-08-25 ENCOUNTER — Other Ambulatory Visit (HOSPITAL_BASED_OUTPATIENT_CLINIC_OR_DEPARTMENT_OTHER): Payer: Medicare HMO

## 2013-08-25 ENCOUNTER — Ambulatory Visit (HOSPITAL_COMMUNITY)
Admission: RE | Admit: 2013-08-25 | Discharge: 2013-08-25 | Disposition: A | Discharge status: Home / Self Care | Payer: Medicare HMO | Source: Ambulatory Visit | Attending: Internal Medicine | Admitting: Internal Medicine

## 2013-08-25 DIAGNOSIS — C3430 Malignant neoplasm of lower lobe, unspecified bronchus or lung: Secondary | ICD-10-CM

## 2013-08-25 DIAGNOSIS — C3490 Malignant neoplasm of unspecified part of unspecified bronchus or lung: Secondary | ICD-10-CM

## 2013-08-25 LAB — CBC WITH DIFFERENTIAL/PLATELET
BASO%: 0.5 % (ref 0.0–2.0)
Basophils Absolute: 0 10*3/uL (ref 0.0–0.1)
EOS%: 1.4 % (ref 0.0–7.0)
Eosinophils Absolute: 0.1 10*3/uL (ref 0.0–0.5)
HCT: 35.2 % — ABNORMAL LOW (ref 38.4–49.9)
HGB: 11.2 g/dL — ABNORMAL LOW (ref 13.0–17.1)
LYMPH%: 12.6 % — ABNORMAL LOW (ref 14.0–49.0)
MCH: 28 pg (ref 27.2–33.4)
MCHC: 31.9 g/dL — AB (ref 32.0–36.0)
MCV: 87.7 fL (ref 79.3–98.0)
MONO#: 0.8 10*3/uL (ref 0.1–0.9)
MONO%: 9.3 % (ref 0.0–14.0)
NEUT%: 76.2 % — ABNORMAL HIGH (ref 39.0–75.0)
NEUTROS ABS: 6.2 10*3/uL (ref 1.5–6.5)
PLATELETS: 452 10*3/uL — AB (ref 140–400)
RBC: 4.01 10*6/uL — AB (ref 4.20–5.82)
RDW: 16.8 % — AB (ref 11.0–14.6)
WBC: 8.2 10*3/uL (ref 4.0–10.3)
lymph#: 1 10*3/uL (ref 0.9–3.3)

## 2013-08-25 LAB — COMPREHENSIVE METABOLIC PANEL (CC13)
ALBUMIN: 3.4 g/dL — AB (ref 3.5–5.0)
ALT: 20 U/L (ref 0–55)
AST: 21 U/L (ref 5–34)
Alkaline Phosphatase: 80 U/L (ref 40–150)
Anion Gap: 8 mEq/L (ref 3–11)
BILIRUBIN TOTAL: 0.27 mg/dL (ref 0.20–1.20)
BUN: 14.3 mg/dL (ref 7.0–26.0)
CO2: 28 meq/L (ref 22–29)
Calcium: 9.8 mg/dL (ref 8.4–10.4)
Chloride: 105 mEq/L (ref 98–109)
Creatinine: 1 mg/dL (ref 0.7–1.3)
Glucose: 145 mg/dl — ABNORMAL HIGH (ref 70–140)
POTASSIUM: 3.9 meq/L (ref 3.5–5.1)
SODIUM: 141 meq/L (ref 136–145)
TOTAL PROTEIN: 7.6 g/dL (ref 6.4–8.3)

## 2013-08-25 MED ORDER — IOHEXOL 300 MG/ML  SOLN
80.0000 mL | Freq: Once | INTRAMUSCULAR | Status: AC | PRN
  Administered 2013-08-25: 80 mL via INTRAVENOUS

## 2013-08-26 ENCOUNTER — Ambulatory Visit (HOSPITAL_BASED_OUTPATIENT_CLINIC_OR_DEPARTMENT_OTHER): Payer: Medicare HMO | Admitting: Internal Medicine

## 2013-08-26 ENCOUNTER — Telehealth: Payer: Self-pay | Admitting: Internal Medicine

## 2013-08-26 ENCOUNTER — Encounter: Payer: Self-pay | Admitting: Internal Medicine

## 2013-08-26 VITALS — BP 137/76 | HR 105 | Temp 98.1°F | Resp 20 | Ht 73.0 in | Wt 190.6 lb

## 2013-08-26 DIAGNOSIS — C3490 Malignant neoplasm of unspecified part of unspecified bronchus or lung: Secondary | ICD-10-CM

## 2013-08-26 DIAGNOSIS — G609 Hereditary and idiopathic neuropathy, unspecified: Secondary | ICD-10-CM

## 2013-08-26 DIAGNOSIS — C3430 Malignant neoplasm of lower lobe, unspecified bronchus or lung: Secondary | ICD-10-CM

## 2013-08-26 NOTE — Progress Notes (Signed)
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   Primary site: Lung (Right)  Staging method: AJCC 7th Edition  Clinical: Stage IIIA (T2b, N2, M0)  Summary: Stage IIIA (T2b, N2, M0)   PRIOR THERAPY:  1) Concurrent chemoradiation with weekly carboplatin for an AUC of 2 and paclitaxel 45 mg/m2, status post 4 cycles. 2) Consolidation chemotherapy with carboplatin for AUC of 5 and paclitaxel 175 mg/M2 every 3 weeks with Neulasta support, first cycle on 03/23/2013. Status post 3 cycles.  CURRENT THERAPY: Observation.  DISEASE STAGE:  Lung cancer  Primary site: Lung (Right)  Staging method: AJCC 7th Edition  Clinical: Stage IIIA (T2b, N2, M0)  Summary: Stage IIIA (T2b, N2, M0)  CHEMOTHERAPY INTENT: Control/Palliative  CURRENT # OF CHEMOTHERAPY CYCLES: 0 CURRENT ANTIEMETICS: Zofran, dexamethasone, Compazine  CURRENT SMOKING STATUS: Former smoker, quit 01/09/2007  ORAL CHEMOTHERAPY AND CONSENT: n/a  CURRENT BISPHOSPHONATES USE: none  PAIN MANAGEMENT: Ibuprofen  NARCOTICS INDUCED CONSTIPATION: None  LIVING WILL AND CODE STATUS: ?  INTERVAL HISTORY: Steve [name] 73 y.o. male returns to the clinic today for followup visit accompanied by his wife. 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 He is currently on Neurontin 300 mg by mouth 3 times a day. He was complaining about some financial issues with the coverage for the Neulasta injection that she received with the consolidation chemotherapy. He denied having any significant chest pain but continues to have shortness breath with exertion with mild cough without hemoptysis He denied having any weight loss or night sweats. The patient has  no fever or chills and no nausea or vomiting. He has repeat CT scan of the chest performed he said he is here for evaluation and discussion of his scan results.   MEDICAL HISTORY: Past Medical History  Diagnosis Date  . Dizziness and giddiness     positional vertigo  . Hyperlipidemia   . Impotence of organic origin   . Osteoarthritis, shoulder   . Allergy   . Hypothyroidism     pt denies thryoid disease, dr pandey note 11-19-12 says subclinical hypothryoid in epic  . COPD (chronic obstructive pulmonary disease)   . Insomnia   . Malignant neoplasm of bladder, part unspecified   . Malignant neoplasm of bronchus and lung, unspecified site   . Diabetes mellitus without complication     pt denies diabetes, dr pandey note says dm 11-19-12 epic    ALLERGIES:  is allergic to morphine and related.  MEDICATIONS:  Current Outpatient Prescriptions  Medication Sig Dispense Refill  . aspirin 325 MG tablet Take 325 mg by mouth daily.      Marland Kitchen gabapentin (NEURONTIN) 300 MG capsule TAKE ONE CAPSULE BY MOUTH THREE TIMES DAILY  270 capsule  0  . oxyCODONE-acetaminophen (ROXICET) 5-325 MG per tablet Take 1 tablet by mouth every 6 (six) hours as needed for severe pain.  120 tablet  0  . simvastatin (ZOCOR) 20 MG tablet Take one tablet by mouth once daily to lower cholesterol  90 tablet  3  . Tdap (BOOSTRIX) 5-2.5-18.5 LF-MCG/0.5 injection Inject 0.5 mLs into the muscle once.  0.5 mL  0  . zolpidem (AMBIEN) 10 MG tablet Take one tablet by mouth once daily  at bedtime for rest  30 tablet  3  . zoster vaccine live, PF, (ZOSTAVAX) 54270 UNT/0.65ML injection Inject 19,400 Units into the skin once.  1 each  0   No current facility-administered medications for this visit.    SURGICAL HISTORY:  Past Surgical History  Procedure Laterality Date  . Cystoscopy w/ dilation of bladder    . Tonsillectomy  age 39  . Endobronchial ultrasound Bilateral 12/15/2012    Procedure: ENDOBRONCHIAL ULTRASOUND;  Surgeon:  Brand Males, MD;  Location: [facility];  Service: Cardiopulmonary;  Laterality: Bilateral;  . Colonoscopy  2009    [facility]    REVIEW OF SYSTEMS:  Constitutional: negative Eyes: negative Ears, nose, mouth, throat, and face: negative Respiratory: positive for cough, dyspnea on exertion and wheezing Cardiovascular: negative Gastrointestinal: negative Genitourinary:negative Integument/breast: negative Hematologic/lymphatic: negative Musculoskeletal:negative Neurological: positive for paresthesia Behavioral/Psych: negative Endocrine: negative Allergic/Immunologic: negative   PHYSICAL EXAMINATION: General appearance: alert, cooperative and no distress Head: Normocephalic, without obvious abnormality, atraumatic Neck: no adenopathy, no JVD, supple, symmetrical, trachea midline and thyroid not enlarged, symmetric, no tenderness/mass/nodules Lymph nodes: Cervical, supraclavicular, and axillary nodes normal. Resp: clear to auscultation bilaterally Back: symmetric, no curvature. ROM normal. No CVA tenderness. Cardio: regular rate and rhythm, S1, S2 normal, no murmur, click, rub or gallop GI: soft, non-tender; bowel sounds normal; no masses,  no organomegaly Extremities: extremities normal, atraumatic, no cyanosis or edema Neurologic: Alert and oriented X 3, normal strength and tone. Normal symmetric reflexes. Normal coordination and gait  ECOG PERFORMANCE STATUS: 1 - Symptomatic but completely ambulatory  Blood pressure 137/76, pulse 105, temperature 98.1 F (36.7 C), temperature source Oral, resp. rate 20, height 6\' 1"  (1.854 m), weight 190 lb 9.6 oz (86.456 kg), SpO2 100.00%.  LABORATORY DATA: Lab Results  Component Value Date   WBC 8.2 08/25/2013   HGB 11.2* 08/25/2013   HCT 35.2* 08/25/2013   MCV 87.7 08/25/2013   PLT 452* 08/25/2013      Chemistry      Component Value Date/Time   NA 141 08/25/2013 1249   NA 142 04/28/2013 0819   K 3.9 08/25/2013 1249   K 5.1 04/28/2013  0819   CL 103 04/28/2013 0819   CO2 28 08/25/2013 1249   CO2 24 04/28/2013 0819   BUN 14.3 08/25/2013 1249   BUN 12 04/28/2013 0819   CREATININE 1.0 08/25/2013 1249   CREATININE 0.87 04/28/2013 0819      Component Value Date/Time   CALCIUM 9.8 08/25/2013 1249   CALCIUM 9.9 04/28/2013 0819   ALKPHOS 80 08/25/2013 1249   ALKPHOS 107 04/28/2013 0819   AST 21 08/25/2013 1249   AST 19 04/28/2013 0819   ALT 20 08/25/2013 1249   ALT 12 04/28/2013 0819   BILITOT 0.27 08/25/2013 1249   BILITOT 0.4 04/28/2013 0819       RADIOGRAPHIC STUDIES: Ct Chest W Contrast  08/25/2013   CLINICAL DATA:  Lung cancer diagnosed 9 months ago. Radiation and chemotherapy completed.  EXAM: CT CHEST WITH CONTRAST  TECHNIQUE: Multidetector CT imaging of the chest was performed during intravenous contrast administration.  CONTRAST:  51mL OMNIPAQUE IOHEXOL 300 MG/ML  SOLN  COMPARISON:  CTs 05/25/2013 and 03/17/2013.  PET-CT 12/03/2012.  FINDINGS: Mediastinum: Right paratracheal node measures 6 mm on image 19 (previously 10 mm). Right hilar adenopathy appears improved. However, there is apparent enlargement of a subcarinal mass extending into the right infrahilar region. This is difficult to differentiate from adjacent opacified lung, but measures at least 3.1  x 4.8 cm transverse on image 37. The right lower lobe bronchus is occluded. There is some mass effect on the right inferior pulmonary veins. The thyroid gland, trachea and esophagus appear normal. The heart size is normal. There is stable atherosclerosis of the aorta, great vessels and coronary arteries.  Lungs/Pleura: A small amount of loculated pleural fluid posteriorly on the right is stable. There is a stable small pericardial effusion. Paramediastinal radiation changes medially in the right lung are again noted with volume loss, scarring and traction bronchiectasis. As noted above, there is cuff of the right lower lobe bronchus with suspicion of an enlarging right infrahilar soft  tissue mass.No peripheral pulmonary nodules are present.  Upper abdomen:  Unremarkable.  There is no adrenal mass.  Musculoskeletal/Chest wall: There are no suspicious chest wall lesions or osseous findings. Diffuse ankylosis is present throughout the thoracic spine.  IMPRESSION: 1. Strong concern of progressive right infrahilar tumor with an ill-defined soft tissue mass occluding the right lower lobe bronchus. Follow up PET-CT recommended. 2. There has been further improvement in the right paratracheal and hilar lymphadenopathy. No distant metastases identified. 3. Paramediastinal radiation changes are present within the right lung. However, these are unlikely to solely account for the increased right infrahilar density on today's examination.   Electronically Signed   By: Camie Patience M.D.   On: 08/25/2013 14:33   ASSESSMENT AND PLAN: This is a very pleasant 73 years old [place] male with history of stage IIIA non-small cell lung cancer who completed a course of concurrent chemoradiation with weekly carboplatin and paclitaxel with no significant complaints except for radiation induced odynophagia.  He also completed 3 cycles of consolidation chemotherapy with reduced dose carboplatin and paclitaxel. He continues to complain of peripheral neuropathy especially in the toes and currently on Neurontin.  His recent CT scan of the chest showed questionable disease progression in the infrahilar area with mass occluding the right lower lobe bronchus. I discussed the scan results and showed the images to the patient and his wife and showed them the images. I have a lengthy discussion with the patient about his current scan results and treatment options. I recommended for him to have a PET scan performed for further evaluation of his disease. If he has evidence for disease progression, the patient may be considered for second line chemotherapy with either immunotherapy with Nivolumab or gemcitabine as a single agent  or in combination with carboplatin. I would see him back for followup visit in 2 weeks for reevaluation. He was advised to call immediately if he has any concerning symptoms in the interval.  The patient voices understanding of current disease status and treatment options and is in agreement with the current care plan.  All questions were answered. The patient knows to call the clinic with any problems, questions or concerns. We can certainly see the patient much sooner if necessary. I spent 20 minutes of face-to-face counseling with the patient and his wife out of the total visit time 30 minutes.  Disclaimer: This note was dictated with voice recognition software. Similar sounding words can inadvertently be transcribed and may not be corrected upon review.

## 2013-08-26 NOTE — Telephone Encounter (Signed)
Pt confirmed labs/ov per 08/19 POF, gave pt AVS..Marland KitchenKJ

## 2013-09-01 ENCOUNTER — Ambulatory Visit: Payer: Medicare HMO | Admitting: Internal Medicine

## 2013-09-01 ENCOUNTER — Encounter (HOSPITAL_COMMUNITY): Admission: RE | Admit: 2013-09-01 | Payer: Medicare HMO | Source: Ambulatory Visit

## 2013-09-02 ENCOUNTER — Encounter (HOSPITAL_COMMUNITY): Payer: Self-pay

## 2013-09-02 ENCOUNTER — Encounter (HOSPITAL_COMMUNITY)
Admission: RE | Admit: 2013-09-02 | Discharge: 2013-09-02 | Disposition: A | Discharge status: Home / Self Care | Payer: Medicare HMO | Source: Ambulatory Visit | Attending: Internal Medicine | Admitting: Internal Medicine

## 2013-09-02 DIAGNOSIS — C3490 Malignant neoplasm of unspecified part of unspecified bronchus or lung: Secondary | ICD-10-CM | POA: Insufficient documentation

## 2013-09-02 LAB — GLUCOSE, CAPILLARY: GLUCOSE-CAPILLARY: 101 mg/dL — AB (ref 70–99)

## 2013-09-02 MED ORDER — FLUDEOXYGLUCOSE F - 18 (FDG) INJECTION: 9.1000 | Freq: Once | INTRAVENOUS | Status: AC | PRN

## 2013-09-03 ENCOUNTER — Encounter: Payer: Self-pay | Admitting: Internal Medicine

## 2013-09-03 NOTE — Progress Notes (Signed)
I've reviewed Mr. Whites account/discussed with Devoria Albe. and decided [facility] 03/22/13 $073.71 and 04/14/13 $714.12 should not be patients responsibility. Called Mr. Morello to advised dos 3/15 and 04/14/13 will be written off. Will have Lenise W. to contact billing for write off.

## 2013-09-09 ENCOUNTER — Other Ambulatory Visit: Payer: Self-pay | Admitting: Internal Medicine

## 2013-09-09 ENCOUNTER — Ambulatory Visit (HOSPITAL_BASED_OUTPATIENT_CLINIC_OR_DEPARTMENT_OTHER): Payer: Medicare HMO | Admitting: Internal Medicine

## 2013-09-09 ENCOUNTER — Other Ambulatory Visit: Payer: Self-pay | Admitting: Medical Oncology

## 2013-09-09 ENCOUNTER — Encounter: Payer: Self-pay | Admitting: Internal Medicine

## 2013-09-09 VITALS — BP 132/88 | HR 112 | Temp 98.0°F | Resp 19 | Ht 73.0 in | Wt 189.0 lb

## 2013-09-09 DIAGNOSIS — G609 Hereditary and idiopathic neuropathy, unspecified: Secondary | ICD-10-CM

## 2013-09-09 DIAGNOSIS — C3430 Malignant neoplasm of lower lobe, unspecified bronchus or lung: Secondary | ICD-10-CM

## 2013-09-09 DIAGNOSIS — C3491 Malignant neoplasm of unspecified part of right bronchus or lung: Secondary | ICD-10-CM

## 2013-09-09 NOTE — Progress Notes (Signed)
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   Primary site: Lung (Right)  Staging method: AJCC 7th Edition  Clinical: Stage IIIA (T2b, N2, M0)  Summary: Stage IIIA (T2b, N2, M0)   PRIOR THERAPY:  1) Concurrent chemoradiation with weekly carboplatin for an AUC of 2 and paclitaxel 45 mg/m2, status post 4 cycles. 2) Consolidation chemotherapy with carboplatin for AUC of 5 and paclitaxel 175 mg/M2 every 3 weeks with Neulasta support, first cycle on 03/23/2013. Status post 3 cycles.  CURRENT THERAPY: Immunotherapy with Nivolumab 3 mg/KG every 2 weeks. First dose 09/21/2013.  DISEASE STAGE:  Lung cancer  Primary site: Lung (Right)  Staging method: AJCC 7th Edition  Clinical: Stage IIIA (T2b, N2, M0)  Summary: Stage IIIA (T2b, N2, M0)  CHEMOTHERAPY INTENT: Control/Palliative  CURRENT # OF CHEMOTHERAPY CYCLES: 0 CURRENT ANTIEMETICS: Zofran, dexamethasone, Compazine  CURRENT SMOKING STATUS: Former smoker, quit 01/09/2007  ORAL CHEMOTHERAPY AND CONSENT: n/a  CURRENT BISPHOSPHONATES USE: none  PAIN MANAGEMENT: Ibuprofen  NARCOTICS INDUCED CONSTIPATION: None  LIVING WILL AND CODE STATUS: ?  INTERVAL HISTORY: Steve [name] 73 y.o. male returns to the clinic today for followup visit accompanied by his wife. 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 He is currently on Neurontin 300 mg by mouth 3 times a day but he takes it only twice a day because he missed the lunch dose. He also takes oxycodone at nighttime which does help his neuropathy before sleeping. He is requesting referral to neurology for evaluation. He denied  having any significant chest pain but continues to have shortness breath with exertion with mild cough without hemoptysis He denied having any weight loss or night sweats. The patient has no fever or chills and no nausea or vomiting. His previous CT scan of the chest showed findings concerning for disease progression. I ordered a PET scan and the patient is here today for evaluation and discussion of his treatment options based on the PET scan results.   MEDICAL HISTORY: Past Medical History  Diagnosis Date  . Dizziness and giddiness     positional vertigo  . Hyperlipidemia   . Impotence of organic origin   . Osteoarthritis, shoulder   . Allergy   . Hypothyroidism     pt denies thryoid disease, dr pandey note 11-19-12 says subclinical hypothryoid in epic  . COPD (chronic obstructive pulmonary disease)   . Insomnia   . Malignant neoplasm of bladder, part unspecified   . Malignant neoplasm of bronchus and lung, unspecified site   . Diabetes mellitus without complication     pt denies diabetes, dr pandey note says dm 11-19-12 epic    ALLERGIES:  is allergic to morphine and related.  MEDICATIONS:  Current Outpatient Prescriptions  Medication Sig Dispense Refill  . aspirin 325 MG tablet Take 325 mg by mouth daily.      Marland Kitchen gabapentin (NEURONTIN) 300 MG capsule TAKE ONE CAPSULE BY MOUTH THREE TIMES DAILY  270 capsule  0  . oxyCODONE-acetaminophen (ROXICET) 5-325 MG per tablet Take 1 tablet by mouth every 6 (six) hours as needed for severe pain.  120 tablet  0  . simvastatin (ZOCOR) 20 MG tablet  Take one tablet by mouth once daily to lower cholesterol  90 tablet  3  . zolpidem (AMBIEN) 10 MG tablet Take one tablet by mouth once daily at bedtime for rest  30 tablet  3  . Tdap (BOOSTRIX) 5-2.5-18.5 LF-MCG/0.5 injection Inject 0.5 mLs into the muscle once.  0.5 mL  0  . zoster vaccine live, PF, (ZOSTAVAX) 16109 UNT/0.65ML injection Inject 19,400 Units into the skin once.  1 each  0   No current  facility-administered medications for this visit.    SURGICAL HISTORY:  Past Surgical History  Procedure Laterality Date  . Cystoscopy w/ dilation of bladder    . Tonsillectomy  age 59  . Endobronchial ultrasound Bilateral 12/15/2012    Procedure: ENDOBRONCHIAL ULTRASOUND;  Surgeon: Brand Males, MD;  Location: [facility];  Service: Cardiopulmonary;  Laterality: Bilateral;  . Colonoscopy  2009    [facility]    REVIEW OF SYSTEMS:  Constitutional: negative Eyes: negative Ears, nose, mouth, throat, and face: negative Respiratory: positive for cough, dyspnea on exertion and wheezing Cardiovascular: negative Gastrointestinal: negative Genitourinary:negative Integument/breast: negative Hematologic/lymphatic: negative Musculoskeletal:negative Neurological: positive for paresthesia Behavioral/Psych: negative Endocrine: negative Allergic/Immunologic: negative   PHYSICAL EXAMINATION: General appearance: alert, cooperative and no distress Head: Normocephalic, without obvious abnormality, atraumatic Neck: no adenopathy, no JVD, supple, symmetrical, trachea midline and thyroid not enlarged, symmetric, no tenderness/mass/nodules Lymph nodes: Cervical, supraclavicular, and axillary nodes normal. Resp: clear to auscultation bilaterally Back: symmetric, no curvature. ROM normal. No CVA tenderness. Cardio: regular rate and rhythm, S1, S2 normal, no murmur, click, rub or gallop GI: soft, non-tender; bowel sounds normal; no masses,  no organomegaly Extremities: extremities normal, atraumatic, no cyanosis or edema Neurologic: Alert and oriented X 3, normal strength and tone. Normal symmetric reflexes. Normal coordination and gait  ECOG PERFORMANCE STATUS: 1 - Symptomatic but completely ambulatory  Blood pressure 132/88, pulse 112, temperature 98 F (36.7 C), temperature source Oral, resp. rate 19, height 6\' 1"  (1.854 m), weight 189 lb (85.73 kg).  LABORATORY DATA: Lab Results  Component  Value Date   WBC 8.2 08/25/2013   HGB 11.2* 08/25/2013   HCT 35.2* 08/25/2013   MCV 87.7 08/25/2013   PLT 452* 08/25/2013      Chemistry      Component Value Date/Time   NA 141 08/25/2013 1249   NA 142 04/28/2013 0819   K 3.9 08/25/2013 1249   K 5.1 04/28/2013 0819   CL 103 04/28/2013 0819   CO2 28 08/25/2013 1249   CO2 24 04/28/2013 0819   BUN 14.3 08/25/2013 1249   BUN 12 04/28/2013 0819   CREATININE 1.0 08/25/2013 1249   CREATININE 0.87 04/28/2013 0819      Component Value Date/Time   CALCIUM 9.8 08/25/2013 1249   CALCIUM 9.9 04/28/2013 0819   ALKPHOS 80 08/25/2013 1249   ALKPHOS 107 04/28/2013 0819   AST 21 08/25/2013 1249   AST 19 04/28/2013 0819   ALT 20 08/25/2013 1249   ALT 12 04/28/2013 0819   BILITOT 0.27 08/25/2013 1249   BILITOT 0.4 04/28/2013 0819       RADIOGRAPHIC STUDIES: Ct Chest W Contrast  08/25/2013   CLINICAL DATA:  Lung cancer diagnosed 9 months ago. Radiation and chemotherapy completed.  EXAM: CT CHEST WITH CONTRAST  TECHNIQUE: Multidetector CT imaging of the chest was performed during intravenous contrast administration.  CONTRAST:  45mL OMNIPAQUE IOHEXOL 300 MG/ML  SOLN  COMPARISON:  CTs 05/25/2013 and 03/17/2013.  PET-CT 12/03/2012.  FINDINGS: Mediastinum: Right paratracheal  node measures 6 mm on image 19 (previously 10 mm). Right hilar adenopathy appears improved. However, there is apparent enlargement of a subcarinal mass extending into the right infrahilar region. This is difficult to differentiate from adjacent opacified lung, but measures at least 3.1 x 4.8 cm transverse on image 37. The right lower lobe bronchus is occluded. There is some mass effect on the right inferior pulmonary veins. The thyroid gland, trachea and esophagus appear normal. The heart size is normal. There is stable atherosclerosis of the aorta, great vessels and coronary arteries.  Lungs/Pleura: A small amount of loculated pleural fluid posteriorly on the right is stable. There is a stable small  pericardial effusion. Paramediastinal radiation changes medially in the right lung are again noted with volume loss, scarring and traction bronchiectasis. As noted above, there is cuff of the right lower lobe bronchus with suspicion of an enlarging right infrahilar soft tissue mass.No peripheral pulmonary nodules are present.  Upper abdomen:  Unremarkable.  There is no adrenal mass.  Musculoskeletal/Chest wall: There are no suspicious chest wall lesions or osseous findings. Diffuse ankylosis is present throughout the thoracic spine.  IMPRESSION: 1. Strong concern of progressive right infrahilar tumor with an ill-defined soft tissue mass occluding the right lower lobe bronchus. Follow up PET-CT recommended. 2. There has been further improvement in the right paratracheal and hilar lymphadenopathy. No distant metastases identified. 3. Paramediastinal radiation changes are present within the right lung. However, these are unlikely to solely account for the increased right infrahilar density on today's examination.   Electronically Signed   By: Camie Patience M.D.   On: 08/25/2013 14:33   Nm Pet Image Restag (ps) Skull Base To Thigh  09/02/2013   CLINICAL DATA:  Subsequent treatment strategy for restaging of lung cancer. Remote history of bladder cancer. Prior chemotherapy and radiation therapy.  EXAM: NUCLEAR MEDICINE PET SKULL BASE TO THIGH  TECHNIQUE: 9.1 mCi F-18 FDG was injected intravenously. Full-ring PET imaging was performed from the skull base to thigh after the radiotracer. CT data was obtained and used for attenuation correction and anatomic localization.  FASTING BLOOD GLUCOSE:  Value: 101 mg/dl  COMPARISON:  Chest CT 08/25/2013.  PET of 12/03/2012  FINDINGS: NECK  No areas of abnormal hypermetabolism.  CHEST  Subcarinal node measures a S.U.V. max of 10.8 on image 88 of series 4. This measured 1.9 cm on 08/25/2013 CT versus 2.1 cm on 05/25/2013 CT.  Contiguous mass within the right infrahilar region,  obstructing right lower lobe bronchus. This measures 3.1 x 4.8 cm on 08/25/2013 CT and a S.U.V. max of 14.8 on image 93. This is enlarged from the CT of 05/25/2013. On the PET of 12/03/2012, this measured a S.U.V. max of 24.3. Adjacent or contiguous hypermetabolic right hilar adenopathy.  Hypermetabolism which corresponds with subtle left upper lobe 5 mm nodule. This measures a S.U.V. max of 2.8 on image 42. This is new since diagnostic CT of 05/25/2013.  ABDOMEN/PELVIS  No areas of abnormal hypermetabolism.  SKELETON  Hypermetabolism about the right greater trochanter left, likely due to trochanteric bursitis. There is mild left rotator cuff arthropathy.  CT IMAGES PERFORMED FOR ATTENUATION CORRECTION  No cervical adenopathy. Presumed sebaceous cyst superficial to the occipital calvarium. Carotid atherosclerosis.  Chest findings deferred to recent diagnostic CT. No acute superimposed process. Multivessel coronary artery atherosclerosis. Aortic and branch vessel atherosclerosis. Mild prostatomegaly. Tiny fat containing left inguinal hernia. Right sacral bone island. Right thoracotomy changes.  IMPRESSION: 1. Compared to 05/25/2013, progressive disease,  including right infrahilar mass with contiguous subcarinal and right hilar hypermetabolic adenopathy. 2. Hypermetabolic left upper lobe pulmonary nodule, most consistent with pulmonary metastasis. This is new when compared to 05/25/2013. 3. No hypermetabolic extrathoracic disease.   Electronically Signed   By: Abigail Miyamoto M.D.   On: 09/02/2013 15:33   ASSESSMENT AND PLAN: This is a very pleasant 73 years old [place] male with   1) metastatic non-small cell lung cancer initially diagnosed as stage IIIA non-small cell lung cancer who completed a course of concurrent chemoradiation with weekly carboplatin and paclitaxel with no significant complaints except for radiation induced odynophagia.  He also completed 3 cycles of consolidation chemotherapy with reduced dose  carboplatin and paclitaxel. He continues to complain of peripheral neuropathy especially in the toes and currently on Neurontin.  His recent CT scan of the chest as well as a PET scan showed questionable disease progression in the infrahilar area with mass occluding the right lower lobe bronchus. There was also small hypermetabolic nodule in the left upper lobe. I discussed the scan results and showed the images to the patient and his wife and showed them the images. I had a lengthy discussion with the patient about his current scan results and treatment options. I gave him the option of palliative care versus proceeding with second line Immunotherapy with Nivolumab versus systemic chemotherapy with single agent gemcitabine plus/minus carboplatin.  The patient is interested in proceeding with the immunotherapy he had several questions today that I answered them completely to his satisfaction. He was also referred to a financial advisor at the [facility] to discuss the treatment and insurance coverage before starting the first dose of his immunotherapy. I reminded him of the adverse effect of this treatment including but not limited to immune mediated colitis with diarrhea, skin rash, liver or renal dysfunction as well as endocrine dysfunction. He would like to proceed with treatment as planned and he is expected to start the first cycle of this treatment on 09/21/2013. He would come back for followup visit in one month's with the start of cycle #2 of his treatment.  2) peripheral neuropathy: The patient will continue his current treatment with Neurontin in addition to oxycodone as needed. I also made a referral for the patient to see neurology for evaluation of his condition.  He was advised to call immediately if he has any concerning symptoms in the interval.  The patient voices understanding of current disease status and treatment options and is in agreement with the current care plan.  All  questions were answered. The patient knows to call the clinic with any problems, questions or concerns. We can certainly see the patient much sooner if necessary. I spent 35 minutes of face-to-face counseling with the patient and his wife out of the total visit time 45 minutes.  Disclaimer: This note was dictated with voice recognition software. Similar sounding words can inadvertently be transcribed and may not be corrected upon review.

## 2013-09-10 ENCOUNTER — Telehealth: Payer: Self-pay | Admitting: *Deleted

## 2013-09-10 ENCOUNTER — Telehealth: Payer: Self-pay | Admitting: Internal Medicine

## 2013-09-10 ENCOUNTER — Telehealth: Payer: Self-pay | Admitting: Medical Oncology

## 2013-09-10 NOTE — Telephone Encounter (Signed)
Per staff message and POF I have scheduled appts. Advised scheduler of appts. JMW  

## 2013-09-10 NOTE — Telephone Encounter (Signed)
Will Nivolimab be covered by his insurance ? Please let him know. I sent in basket message to Dannielle Huh.

## 2013-09-10 NOTE — Telephone Encounter (Signed)
Pt confirmed labs/ov per 09/02 POF, sent msg to add chemo, also req MKM for all visits to be on Tues per pt's req, approved through [facility] no needed new POF, mailed pt updated sch...KJ

## 2013-09-15 ENCOUNTER — Encounter: Payer: Self-pay | Admitting: Internal Medicine

## 2013-09-15 NOTE — Progress Notes (Signed)
Patient checking to see if asst for Opdivo. I advised him if so Lenise will let him know. He has [employer].

## 2013-09-17 ENCOUNTER — Encounter: Payer: Self-pay | Admitting: Internal Medicine

## 2013-09-17 NOTE — Progress Notes (Signed)
[employer] for copay assistance for Nivolumab (OPDIVO).  Because of the type of insurance pt has the company can't offer assistance.  Their program is for pt's w/ commercial insurance only.  They will fax me an enrollment form to research any third party foundations that offer assistance w/ his insurance.  Once received I will give to Dr. Julien Nordmann to fill out his portion.  Mr. Depaolo gave me permission to sign in his behalf.  I will fax back to the company to get the process going.

## 2013-09-21 ENCOUNTER — Encounter: Payer: Self-pay | Admitting: Internal Medicine

## 2013-09-22 ENCOUNTER — Other Ambulatory Visit: Payer: Self-pay | Admitting: Internal Medicine

## 2013-09-22 ENCOUNTER — Ambulatory Visit: Payer: Medicare HMO | Admitting: Neurology

## 2013-09-22 ENCOUNTER — Other Ambulatory Visit (HOSPITAL_BASED_OUTPATIENT_CLINIC_OR_DEPARTMENT_OTHER): Payer: Medicare HMO

## 2013-09-22 ENCOUNTER — Ambulatory Visit (HOSPITAL_BASED_OUTPATIENT_CLINIC_OR_DEPARTMENT_OTHER): Payer: Medicare HMO

## 2013-09-22 ENCOUNTER — Encounter: Payer: Self-pay | Admitting: Internal Medicine

## 2013-09-22 VITALS — BP 131/77 | HR 102 | Temp 97.8°F | Resp 18

## 2013-09-22 DIAGNOSIS — C3490 Malignant neoplasm of unspecified part of unspecified bronchus or lung: Secondary | ICD-10-CM

## 2013-09-22 DIAGNOSIS — E039 Hypothyroidism, unspecified: Secondary | ICD-10-CM

## 2013-09-22 DIAGNOSIS — Z5112 Encounter for antineoplastic immunotherapy: Secondary | ICD-10-CM

## 2013-09-22 DIAGNOSIS — C3430 Malignant neoplasm of lower lobe, unspecified bronchus or lung: Secondary | ICD-10-CM

## 2013-09-22 DIAGNOSIS — C3491 Malignant neoplasm of unspecified part of right bronchus or lung: Secondary | ICD-10-CM

## 2013-09-22 LAB — COMPREHENSIVE METABOLIC PANEL (CC13)
ALT: 19 U/L (ref 0–55)
AST: 15 U/L (ref 5–34)
Albumin: 3.4 g/dL — ABNORMAL LOW (ref 3.5–5.0)
Alkaline Phosphatase: 79 U/L (ref 40–150)
Anion Gap: 11 mEq/L (ref 3–11)
BUN: 13.9 mg/dL (ref 7.0–26.0)
CALCIUM: 9.7 mg/dL (ref 8.4–10.4)
CHLORIDE: 108 meq/L (ref 98–109)
CO2: 23 mEq/L (ref 22–29)
Creatinine: 0.8 mg/dL (ref 0.7–1.3)
GLUCOSE: 128 mg/dL (ref 70–140)
Potassium: 4 mEq/L (ref 3.5–5.1)
Sodium: 143 mEq/L (ref 136–145)
Total Bilirubin: 0.29 mg/dL (ref 0.20–1.20)
Total Protein: 7.8 g/dL (ref 6.4–8.3)

## 2013-09-22 LAB — CBC WITH DIFFERENTIAL/PLATELET
BASO%: 0.4 % (ref 0.0–2.0)
BASOS ABS: 0 10*3/uL (ref 0.0–0.1)
EOS%: 1.6 % (ref 0.0–7.0)
Eosinophils Absolute: 0.1 10*3/uL (ref 0.0–0.5)
HEMATOCRIT: 38 % — AB (ref 38.4–49.9)
HEMOGLOBIN: 12.1 g/dL — AB (ref 13.0–17.1)
LYMPH#: 1.1 10*3/uL (ref 0.9–3.3)
LYMPH%: 13.9 % — ABNORMAL LOW (ref 14.0–49.0)
MCH: 27.6 pg (ref 27.2–33.4)
MCHC: 31.8 g/dL — ABNORMAL LOW (ref 32.0–36.0)
MCV: 86.8 fL (ref 79.3–98.0)
MONO#: 0.7 10*3/uL (ref 0.1–0.9)
MONO%: 8.6 % (ref 0.0–14.0)
NEUT#: 6.2 10*3/uL (ref 1.5–6.5)
NEUT%: 75.5 % — AB (ref 39.0–75.0)
PLATELETS: 477 10*3/uL — AB (ref 140–400)
RBC: 4.37 10*6/uL (ref 4.20–5.82)
RDW: 16.7 % — ABNORMAL HIGH (ref 11.0–14.6)
WBC: 8.2 10*3/uL (ref 4.0–10.3)

## 2013-09-22 LAB — TSH CHCC: TSH: 2.162 m(IU)/L (ref 0.320–4.118)

## 2013-09-22 MED ORDER — SODIUM CHLORIDE 0.9 % IV SOLN
3.0000 mg/kg | Freq: Once | INTRAVENOUS | Status: AC
  Administered 2013-09-22: 260 mg via INTRAVENOUS
  Filled 2013-09-22: qty 26

## 2013-09-22 MED ORDER — SODIUM CHLORIDE 0.9 % IV SOLN
Freq: Once | INTRAVENOUS | Status: AC
  Administered 2013-09-22: 11:00:00 via INTRAVENOUS

## 2013-09-22 NOTE — Progress Notes (Signed)
Received letter from Patient [employer].  Pt is approved for Opdivo or any medication associated w/ [facility] from 09/21/13 to 09/21/14.  Expenses can be submitted for reimbursement for dos 06/23/13 to 09/21/14.  Amount of grant is $7500.  I will forward copy of letter to [facility] in the billing dept.

## 2013-09-22 NOTE — Patient Instructions (Signed)
[address] Discharge Instructions for Patients Receiving Chemotherapy  Today you received the following chemotherapy agents Nivolumab  To help prevent nausea and vomiting after your treatment, we encourage you to take your nausea medication Zofran as directed  If you develop nausea and vomiting that is not controlled by your nausea medication, call the clinic.   BELOW ARE SYMPTOMS THAT SHOULD BE REPORTED IMMEDIATELY:  *FEVER GREATER THAN 100.5 F  *CHILLS WITH OR WITHOUT FEVER  NAUSEA AND VOMITING THAT IS NOT CONTROLLED WITH YOUR NAUSEA MEDICATION  *UNUSUAL SHORTNESS OF BREATH  *UNUSUAL BRUISING OR BLEEDING  TENDERNESS IN MOUTH AND THROAT WITH OR WITHOUT PRESENCE OF ULCERS  *URINARY PROBLEMS  *BOWEL PROBLEMS  UNUSUAL RASH Items with * indicate a potential emergency and should be followed up as soon as possible.  Feel free to call the clinic you have any questions or concerns. The clinic phone number is [phone].

## 2013-09-23 ENCOUNTER — Telehealth: Payer: Self-pay | Admitting: *Deleted

## 2013-09-23 NOTE — Telephone Encounter (Signed)
Some musculoskeletal pain that was resolved with oxycodone tablet last night. Coughing persists-getting appointment with PCP about this (says MD is aware). Requested nurse review common side effects with him again-

## 2013-09-25 ENCOUNTER — Telehealth: Payer: Self-pay | Admitting: Medical Oncology

## 2013-09-25 NOTE — Telephone Encounter (Signed)
Doing well just had some sore muscle and fatigue a cough . Steve [name] He is going to get a colonoscopy.

## 2013-09-29 ENCOUNTER — Other Ambulatory Visit: Payer: Self-pay | Admitting: *Deleted

## 2013-09-29 DIAGNOSIS — C3490 Malignant neoplasm of unspecified part of unspecified bronchus or lung: Secondary | ICD-10-CM

## 2013-09-29 MED ORDER — OXYCODONE-ACETAMINOPHEN 5-325 MG PO TABS: 1.0000 | ORAL_TABLET | Freq: Four times a day (QID) | ORAL | Status: DC | PRN

## 2013-09-29 NOTE — Telephone Encounter (Signed)
Patient requested and will pick up 

## 2013-10-05 ENCOUNTER — Ambulatory Visit: Payer: Medicare HMO | Admitting: Oncology

## 2013-10-05 ENCOUNTER — Other Ambulatory Visit: Payer: Medicare HMO

## 2013-10-06 ENCOUNTER — Encounter: Payer: Self-pay | Admitting: Internal Medicine

## 2013-10-06 ENCOUNTER — Ambulatory Visit (HOSPITAL_BASED_OUTPATIENT_CLINIC_OR_DEPARTMENT_OTHER): Payer: Medicare HMO

## 2013-10-06 ENCOUNTER — Other Ambulatory Visit: Payer: Self-pay | Admitting: Medical Oncology

## 2013-10-06 ENCOUNTER — Ambulatory Visit (HOSPITAL_BASED_OUTPATIENT_CLINIC_OR_DEPARTMENT_OTHER): Payer: Medicare HMO | Admitting: Internal Medicine

## 2013-10-06 ENCOUNTER — Other Ambulatory Visit (HOSPITAL_BASED_OUTPATIENT_CLINIC_OR_DEPARTMENT_OTHER): Payer: Medicare HMO

## 2013-10-06 VITALS — BP 134/77 | HR 118 | Temp 98.0°F | Resp 20 | Ht 73.0 in | Wt 188.2 lb

## 2013-10-06 DIAGNOSIS — C3430 Malignant neoplasm of lower lobe, unspecified bronchus or lung: Secondary | ICD-10-CM

## 2013-10-06 DIAGNOSIS — C3490 Malignant neoplasm of unspecified part of unspecified bronchus or lung: Secondary | ICD-10-CM

## 2013-10-06 DIAGNOSIS — Z79899 Other long term (current) drug therapy: Secondary | ICD-10-CM

## 2013-10-06 DIAGNOSIS — G609 Hereditary and idiopathic neuropathy, unspecified: Secondary | ICD-10-CM

## 2013-10-06 DIAGNOSIS — C3491 Malignant neoplasm of unspecified part of right bronchus or lung: Secondary | ICD-10-CM

## 2013-10-06 DIAGNOSIS — Z5112 Encounter for antineoplastic immunotherapy: Secondary | ICD-10-CM

## 2013-10-06 LAB — COMPREHENSIVE METABOLIC PANEL (CC13)
ALK PHOS: 82 U/L (ref 40–150)
ALT: 19 U/L (ref 0–55)
AST: 13 U/L (ref 5–34)
Albumin: 3 g/dL — ABNORMAL LOW (ref 3.5–5.0)
Anion Gap: 8 mEq/L (ref 3–11)
BUN: 14 mg/dL (ref 7.0–26.0)
CO2: 27 meq/L (ref 22–29)
Calcium: 10 mg/dL (ref 8.4–10.4)
Chloride: 104 mEq/L (ref 98–109)
Creatinine: 0.9 mg/dL (ref 0.7–1.3)
GLUCOSE: 182 mg/dL — AB (ref 70–140)
POTASSIUM: 4.2 meq/L (ref 3.5–5.1)
SODIUM: 139 meq/L (ref 136–145)
TOTAL PROTEIN: 7.7 g/dL (ref 6.4–8.3)
Total Bilirubin: 0.36 mg/dL (ref 0.20–1.20)

## 2013-10-06 LAB — CBC WITH DIFFERENTIAL/PLATELET
BASO%: 0.2 % (ref 0.0–2.0)
Basophils Absolute: 0 10*3/uL (ref 0.0–0.1)
EOS ABS: 0.2 10*3/uL (ref 0.0–0.5)
EOS%: 1.9 % (ref 0.0–7.0)
HCT: 37.6 % — ABNORMAL LOW (ref 38.4–49.9)
HGB: 12 g/dL — ABNORMAL LOW (ref 13.0–17.1)
LYMPH#: 0.9 10*3/uL (ref 0.9–3.3)
LYMPH%: 10.6 % — ABNORMAL LOW (ref 14.0–49.0)
MCH: 27.6 pg (ref 27.2–33.4)
MCHC: 31.9 g/dL — ABNORMAL LOW (ref 32.0–36.0)
MCV: 86.6 fL (ref 79.3–98.0)
MONO#: 0.7 10*3/uL (ref 0.1–0.9)
MONO%: 7.9 % (ref 0.0–14.0)
NEUT%: 79.4 % — ABNORMAL HIGH (ref 39.0–75.0)
NEUTROS ABS: 6.7 10*3/uL — AB (ref 1.5–6.5)
Platelets: 452 10*3/uL — ABNORMAL HIGH (ref 140–400)
RBC: 4.34 10*6/uL (ref 4.20–5.82)
RDW: 14.9 % — AB (ref 11.0–14.6)
WBC: 8.5 10*3/uL (ref 4.0–10.3)

## 2013-10-06 MED ORDER — SODIUM CHLORIDE 0.9 % IV SOLN
Freq: Once | INTRAVENOUS | Status: AC
  Administered 2013-10-06: 10:00:00 via INTRAVENOUS

## 2013-10-06 MED ORDER — SODIUM CHLORIDE 0.9 % IV SOLN: 3.0000 mg/kg | Freq: Once | INTRAVENOUS | Status: DC

## 2013-10-06 MED ORDER — NIVOLUMAB CHEMO INJECTION 100 MG/10ML
3.0000 mg/kg | Freq: Once | INTRAVENOUS | Status: AC
  Administered 2013-10-06: 280 mg via INTRAVENOUS
  Filled 2013-10-06: qty 28

## 2013-10-06 NOTE — Progress Notes (Signed)
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   Primary site: Lung (Right)  Staging method: AJCC 7th Edition  Clinical: Stage IIIA (T2b, N2, M0)  Summary: Stage IIIA (T2b, N2, M0)   PRIOR THERAPY:  1) Concurrent chemoradiation with weekly carboplatin for an AUC of 2 and paclitaxel 45 mg/m2, status post 4 cycles. 2) Consolidation chemotherapy with carboplatin for AUC of 5 and paclitaxel 175 mg/M2 every 3 weeks with Neulasta support, first cycle on 03/23/2013. Status post 3 cycles.  CURRENT THERAPY: Immunotherapy with Nivolumab 3 mg/KG every 2 weeks. First dose 09/21/2013. Status post 1 cycle.  DISEASE STAGE:  Lung cancer  Primary site: Lung (Right)  Staging method: AJCC 7th Edition  Clinical: Stage IIIA (T2b, N2, M0)  Summary: Stage IIIA (T2b, N2, M0)  CHEMOTHERAPY INTENT: Control/Palliative  CURRENT # OF CHEMOTHERAPY CYCLES: 1 CURRENT ANTIEMETICS: Zofran, dexamethasone, Compazine  CURRENT SMOKING STATUS: Former smoker, quit 01/09/2007  ORAL CHEMOTHERAPY AND CONSENT: n/a  CURRENT BISPHOSPHONATES USE: none  PAIN MANAGEMENT: Ibuprofen  NARCOTICS INDUCED CONSTIPATION: None  LIVING WILL AND CODE STATUS: ?  INTERVAL HISTORY: Steve [name] 73 y.o. male returns to the clinic today for followup visit accompanied by his wife. The patient tolerated the first cycle of his immunotherapy with Nivolumab fairly well with no significant adverse effects. He denied having any significant chest pain but continues to have shortness of breath with exertion with mild cough without hemoptysis He denied having any weight loss or night sweats. The patient has no fever or  chills and no nausea or vomiting. He is here today to start cycle #2.  MEDICAL HISTORY: Past Medical History  Diagnosis Date  . Dizziness and giddiness     positional vertigo  . Hyperlipidemia   . Impotence of organic origin   . Osteoarthritis, shoulder   . Allergy   . Hypothyroidism     pt denies thryoid disease, dr pandey note 11-19-12 says subclinical hypothryoid in epic  . COPD (chronic obstructive pulmonary disease)   . Insomnia   . Malignant neoplasm of bladder, part unspecified   . Malignant neoplasm of bronchus and lung, unspecified site   . Diabetes mellitus without complication     pt denies diabetes, dr pandey note says dm 11-19-12 epic    ALLERGIES:  is allergic to morphine and related.  MEDICATIONS:  Current Outpatient Prescriptions  Medication Sig Dispense Refill  . aspirin 325 MG tablet Take 325 mg by mouth daily.      Marland Kitchen gabapentin (NEURONTIN) 300 MG capsule TAKE 1 CAPSULE BY MOUTH THREE TIMES DAILY  270 capsule  0  . oxyCODONE-acetaminophen (ROXICET) 5-325 MG per tablet Take 1 tablet by mouth every 6 (six) hours as needed for severe pain.  120 tablet  0  . simvastatin (ZOCOR) 20 MG tablet Take one tablet by mouth once daily to lower cholesterol  90 tablet  3  . Tdap (BOOSTRIX) 5-2.5-18.5 LF-MCG/0.5 injection Inject 0.5 mLs into the muscle once.  0.5 mL  0  . zolpidem (AMBIEN) 10 MG tablet TAKE 1 TABLET BY MOUTH EVERY NIGHT AT BEDTIME FOR REST  30 tablet  0  . zoster vaccine live, PF, (ZOSTAVAX) 58527 UNT/0.65ML injection Inject  19,400 Units into the skin once.  1 each  0   No current facility-administered medications for this visit.    SURGICAL HISTORY:  Past Surgical History  Procedure Laterality Date  . Cystoscopy w/ dilation of bladder    . Tonsillectomy  age 86  . Endobronchial ultrasound Bilateral 12/15/2012    Procedure: ENDOBRONCHIAL ULTRASOUND;  Surgeon: Brand Males, MD;  Location: [facility];  Service: Cardiopulmonary;  Laterality: Bilateral;    . Colonoscopy  2009    [facility]    REVIEW OF SYSTEMS:  Constitutional: negative Eyes: negative Ears, nose, mouth, throat, and face: negative Respiratory: positive for cough, dyspnea on exertion and wheezing Cardiovascular: negative Gastrointestinal: negative Genitourinary:negative Integument/breast: negative Hematologic/lymphatic: negative Musculoskeletal:negative Neurological: positive for paresthesia Behavioral/Psych: negative Endocrine: negative Allergic/Immunologic: negative   PHYSICAL EXAMINATION: General appearance: alert, cooperative and no distress Head: Normocephalic, without obvious abnormality, atraumatic Neck: no adenopathy, no JVD, supple, symmetrical, trachea midline and thyroid not enlarged, symmetric, no tenderness/mass/nodules Lymph nodes: Cervical, supraclavicular, and axillary nodes normal. Resp: clear to auscultation bilaterally Back: symmetric, no curvature. ROM normal. No CVA tenderness. Cardio: regular rate and rhythm, S1, S2 normal, no murmur, click, rub or gallop GI: soft, non-tender; bowel sounds normal; no masses,  no organomegaly Extremities: extremities normal, atraumatic, no cyanosis or edema Neurologic: Alert and oriented X 3, normal strength and tone. Normal symmetric reflexes. Normal coordination and gait  ECOG PERFORMANCE STATUS: 1 - Symptomatic but completely ambulatory  Blood pressure 134/77, pulse 118, temperature 98 F (36.7 C), temperature source Oral, resp. rate 20, height 6\' 1"  (1.854 m), weight 188 lb 3.2 oz (85.367 kg).  LABORATORY DATA: Lab Results  Component Value Date   WBC 8.5 10/06/2013   HGB 12.0* 10/06/2013   HCT 37.6* 10/06/2013   MCV 86.6 10/06/2013   PLT 452* 10/06/2013      Chemistry      Component Value Date/Time   NA 143 09/22/2013 0915   NA 142 04/28/2013 0819   K 4.0 09/22/2013 0915   K 5.1 04/28/2013 0819   CL 103 04/28/2013 0819   CO2 23 09/22/2013 0915   CO2 24 04/28/2013 0819   BUN 13.9 09/22/2013 0915   BUN  12 04/28/2013 0819   CREATININE 0.8 09/22/2013 0915   CREATININE 0.87 04/28/2013 0819      Component Value Date/Time   CALCIUM 9.7 09/22/2013 0915   CALCIUM 9.9 04/28/2013 0819   ALKPHOS 79 09/22/2013 0915   ALKPHOS 107 04/28/2013 0819   AST 15 09/22/2013 0915   AST 19 04/28/2013 0819   ALT 19 09/22/2013 0915   ALT 12 04/28/2013 0819   BILITOT 0.29 09/22/2013 0915   BILITOT 0.4 04/28/2013 0819       RADIOGRAPHIC STUDIES:  ASSESSMENT AND PLAN: This is a very pleasant 73 years old [place] male with:   1) metastatic non-small cell lung cancer initially diagnosed as stage IIIA non-small cell lung cancer who completed a course of concurrent chemoradiation with weekly carboplatin and paclitaxel with no significant complaints except for radiation induced odynophagia.  He also completed 3 cycles of consolidation chemotherapy with reduced dose carboplatin and paclitaxel. He is currently undergoing immunotherapy with Nivolumab status post 1 cycle. He tolerated the first cycle of his treatment fairly well with no significant adverse effects. I recommended for the patient to proceed with cycle #2 today as scheduled. He would come back for followup visit in 2 weeks with the next cycle of his immunotherapy.  2) peripheral neuropathy: The patient will  continue his current treatment with Neurontin in addition to oxycodone as needed. I also made a referral for the patient to see neurology for evaluation of his condition.  He was advised to call immediately if he has any concerning symptoms in the interval.  The patient voices understanding of current disease status and treatment options and is in agreement with the current care plan.  All questions were answered. The patient knows to call the clinic with any problems, questions or concerns. We can certainly see the patient much sooner if necessary.  Disclaimer: This note was dictated with voice recognition software. Similar sounding words can inadvertently be  transcribed and may not be corrected upon review.

## 2013-10-06 NOTE — Patient Instructions (Signed)
[facility] Discharge Instructions for Patients Receiving Chemotherapy  Today you received the following chemotherapy agents Nivolumab  To help prevent nausea and vomiting after your treatment, we encourage you to take your nausea medication as prescribed.   If you develop nausea and vomiting that is not controlled by your nausea medication, call the clinic.   BELOW ARE SYMPTOMS THAT SHOULD BE REPORTED IMMEDIATELY:  *FEVER GREATER THAN 100.5 F  *CHILLS WITH OR WITHOUT FEVER  NAUSEA AND VOMITING THAT IS NOT CONTROLLED WITH YOUR NAUSEA MEDICATION  *UNUSUAL SHORTNESS OF BREATH  *UNUSUAL BRUISING OR BLEEDING  TENDERNESS IN MOUTH AND THROAT WITH OR WITHOUT PRESENCE OF ULCERS  *URINARY PROBLEMS  *BOWEL PROBLEMS  UNUSUAL RASH Items with * indicate a potential emergency and should be followed up as soon as possible.  Feel free to call the clinic you have any questions or concerns. The clinic phone number is [phone].

## 2013-10-07 ENCOUNTER — Telehealth: Payer: Self-pay | Admitting: Internal Medicine

## 2013-10-07 NOTE — Telephone Encounter (Signed)
added 2wk lb/fu w/tx 10/13. no avialability w/MM pt scheduled for 10/13 w/AJ. s/w pt re appt for lab 10/8 (already on schedule) and lb/AJ/tx 10/13 @ 1:45pm.

## 2013-10-12 ENCOUNTER — Other Ambulatory Visit: Payer: Medicare HMO

## 2013-10-13 ENCOUNTER — Encounter: Payer: Self-pay | Admitting: Internal Medicine

## 2013-10-14 ENCOUNTER — Ambulatory Visit: Payer: Medicare HMO | Admitting: Internal Medicine

## 2013-10-15 ENCOUNTER — Other Ambulatory Visit (HOSPITAL_BASED_OUTPATIENT_CLINIC_OR_DEPARTMENT_OTHER): Payer: Medicare HMO

## 2013-10-15 DIAGNOSIS — E039 Hypothyroidism, unspecified: Secondary | ICD-10-CM

## 2013-10-15 DIAGNOSIS — Z79899 Other long term (current) drug therapy: Secondary | ICD-10-CM

## 2013-10-15 DIAGNOSIS — C3430 Malignant neoplasm of lower lobe, unspecified bronchus or lung: Secondary | ICD-10-CM

## 2013-10-15 DIAGNOSIS — C3491 Malignant neoplasm of unspecified part of right bronchus or lung: Secondary | ICD-10-CM

## 2013-10-15 LAB — CBC WITH DIFFERENTIAL/PLATELET
BASO%: 0.3 % (ref 0.0–2.0)
Basophils Absolute: 0 10*3/uL (ref 0.0–0.1)
EOS%: 1.2 % (ref 0.0–7.0)
Eosinophils Absolute: 0.1 10*3/uL (ref 0.0–0.5)
HCT: 34.7 % — ABNORMAL LOW (ref 38.4–49.9)
HGB: 11 g/dL — ABNORMAL LOW (ref 13.0–17.1)
LYMPH%: 9.7 % — AB (ref 14.0–49.0)
MCH: 27.2 pg (ref 27.2–33.4)
MCHC: 31.7 g/dL — AB (ref 32.0–36.0)
MCV: 85.9 fL (ref 79.3–98.0)
MONO#: 0.8 10*3/uL (ref 0.1–0.9)
MONO%: 10.5 % (ref 0.0–14.0)
NEUT#: 6.1 10*3/uL (ref 1.5–6.5)
NEUT%: 78.3 % — ABNORMAL HIGH (ref 39.0–75.0)
PLATELETS: 507 10*3/uL — AB (ref 140–400)
RBC: 4.04 10*6/uL — AB (ref 4.20–5.82)
RDW: 15.1 % — ABNORMAL HIGH (ref 11.0–14.6)
WBC: 7.8 10*3/uL (ref 4.0–10.3)
lymph#: 0.8 10*3/uL — ABNORMAL LOW (ref 0.9–3.3)

## 2013-10-15 LAB — COMPREHENSIVE METABOLIC PANEL (CC13)
ALT: 20 U/L (ref 0–55)
AST: 14 U/L (ref 5–34)
Albumin: 2.8 g/dL — ABNORMAL LOW (ref 3.5–5.0)
Alkaline Phosphatase: 75 U/L (ref 40–150)
Anion Gap: 9 mEq/L (ref 3–11)
BILIRUBIN TOTAL: 0.3 mg/dL (ref 0.20–1.20)
BUN: 15.1 mg/dL (ref 7.0–26.0)
CHLORIDE: 104 meq/L (ref 98–109)
CO2: 28 mEq/L (ref 22–29)
CREATININE: 0.9 mg/dL (ref 0.7–1.3)
Calcium: 10 mg/dL (ref 8.4–10.4)
Glucose: 189 mg/dl — ABNORMAL HIGH (ref 70–140)
Potassium: 4.2 mEq/L (ref 3.5–5.1)
Sodium: 141 mEq/L (ref 136–145)
Total Protein: 7.4 g/dL (ref 6.4–8.3)

## 2013-10-15 LAB — TSH CHCC: TSH: 1.079 m[IU]/L (ref 0.320–4.118)

## 2013-10-20 ENCOUNTER — Other Ambulatory Visit (HOSPITAL_BASED_OUTPATIENT_CLINIC_OR_DEPARTMENT_OTHER): Payer: Medicare HMO

## 2013-10-20 ENCOUNTER — Encounter (HOSPITAL_COMMUNITY): Payer: Self-pay

## 2013-10-20 ENCOUNTER — Ambulatory Visit: Payer: Medicare HMO

## 2013-10-20 ENCOUNTER — Ambulatory Visit (HOSPITAL_BASED_OUTPATIENT_CLINIC_OR_DEPARTMENT_OTHER): Payer: Medicare HMO | Admitting: Physician Assistant

## 2013-10-20 ENCOUNTER — Encounter: Payer: Self-pay | Admitting: Physician Assistant

## 2013-10-20 ENCOUNTER — Telehealth: Payer: Self-pay | Admitting: Physician Assistant

## 2013-10-20 ENCOUNTER — Ambulatory Visit (HOSPITAL_COMMUNITY)
Admission: RE | Admit: 2013-10-20 | Discharge: 2013-10-20 | Disposition: A | Discharge status: Home / Self Care | Payer: Medicare HMO | Source: Ambulatory Visit | Attending: Physician Assistant | Admitting: Physician Assistant

## 2013-10-20 VITALS — BP 119/53 | HR 56 | Temp 97.7°F | Resp 20 | Ht 73.0 in | Wt 187.0 lb

## 2013-10-20 DIAGNOSIS — R0609 Other forms of dyspnea: Secondary | ICD-10-CM

## 2013-10-20 DIAGNOSIS — R0602 Shortness of breath: Secondary | ICD-10-CM | POA: Diagnosis present

## 2013-10-20 DIAGNOSIS — R0600 Dyspnea, unspecified: Secondary | ICD-10-CM

## 2013-10-20 DIAGNOSIS — C3491 Malignant neoplasm of unspecified part of right bronchus or lung: Secondary | ICD-10-CM

## 2013-10-20 DIAGNOSIS — C3490 Malignant neoplasm of unspecified part of unspecified bronchus or lung: Secondary | ICD-10-CM

## 2013-10-20 DIAGNOSIS — C3430 Malignant neoplasm of lower lobe, unspecified bronchus or lung: Secondary | ICD-10-CM

## 2013-10-20 LAB — CBC WITH DIFFERENTIAL/PLATELET
BASO%: 0.5 % (ref 0.0–2.0)
BASOS ABS: 0 10*3/uL (ref 0.0–0.1)
EOS ABS: 0.2 10*3/uL (ref 0.0–0.5)
EOS%: 1.9 % (ref 0.0–7.0)
HEMATOCRIT: 35.7 % — AB (ref 38.4–49.9)
HEMOGLOBIN: 11.4 g/dL — AB (ref 13.0–17.1)
LYMPH%: 11.9 % — ABNORMAL LOW (ref 14.0–49.0)
MCH: 27.1 pg — AB (ref 27.2–33.4)
MCHC: 32 g/dL (ref 32.0–36.0)
MCV: 84.7 fL (ref 79.3–98.0)
MONO#: 0.9 10*3/uL (ref 0.1–0.9)
MONO%: 10 % (ref 0.0–14.0)
NEUT#: 6.7 10*3/uL — ABNORMAL HIGH (ref 1.5–6.5)
NEUT%: 75.7 % — ABNORMAL HIGH (ref 39.0–75.0)
Platelets: 670 10*3/uL — ABNORMAL HIGH (ref 140–400)
RBC: 4.21 10*6/uL (ref 4.20–5.82)
RDW: 15.6 % — ABNORMAL HIGH (ref 11.0–14.6)
WBC: 8.9 10*3/uL (ref 4.0–10.3)
lymph#: 1.1 10*3/uL (ref 0.9–3.3)

## 2013-10-20 LAB — COMPREHENSIVE METABOLIC PANEL (CC13)
ALK PHOS: 80 U/L (ref 40–150)
ALT: 34 U/L (ref 0–55)
AST: 23 U/L (ref 5–34)
Albumin: 2.9 g/dL — ABNORMAL LOW (ref 3.5–5.0)
Anion Gap: 13 mEq/L — ABNORMAL HIGH (ref 3–11)
BUN: 17.8 mg/dL (ref 7.0–26.0)
CO2: 25 mEq/L (ref 22–29)
CREATININE: 0.9 mg/dL (ref 0.7–1.3)
Calcium: 10 mg/dL (ref 8.4–10.4)
Chloride: 103 mEq/L (ref 98–109)
GLUCOSE: 125 mg/dL (ref 70–140)
Potassium: 4 mEq/L (ref 3.5–5.1)
Sodium: 141 mEq/L (ref 136–145)
Total Bilirubin: 0.27 mg/dL (ref 0.20–1.20)
Total Protein: 7.8 g/dL (ref 6.4–8.3)

## 2013-10-20 MED ORDER — IOHEXOL 350 MG/ML SOLN
100.0000 mL | Freq: Once | INTRAVENOUS | Status: AC | PRN
  Administered 2013-10-20: 100 mL via INTRAVENOUS

## 2013-10-20 NOTE — Progress Notes (Addendum)
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   Primary site: Lung (Right)  Staging method: AJCC 7th Edition  Clinical: Stage IIIA (T2b, N2, M0)  Summary: Stage IIIA (T2b, N2, M0)   PRIOR THERAPY:  1) Concurrent chemoradiation with weekly carboplatin for an AUC of 2 and paclitaxel 45 mg/m2, status post 4 cycles. 2) Consolidation chemotherapy with carboplatin for AUC of 5 and paclitaxel 175 mg/M2 every 3 weeks with Neulasta support, first cycle on 03/23/2013. Status post 3 cycles.  CURRENT THERAPY: Immunotherapy with Nivolumab 3 mg/KG every 2 weeks. First dose 09/21/2013. Status post 2 cycle.  DISEASE STAGE:  Lung cancer  Primary site: Lung (Right)  Staging method: AJCC 7th Edition  Clinical: Stage IIIA (T2b, N2, M0)  Summary: Stage IIIA (T2b, N2, M0)  CHEMOTHERAPY INTENT: Control/Palliative  CURRENT # OF CHEMOTHERAPY CYCLES: 2 CURRENT ANTIEMETICS: Zofran, dexamethasone, Compazine  CURRENT SMOKING STATUS: Former smoker, quit 01/09/2007  ORAL CHEMOTHERAPY AND CONSENT: n/a  CURRENT BISPHOSPHONATES USE: Steve [name]  PAIN MANAGEMENT: Ibuprofen  NARCOTICS INDUCED CONSTIPATION: Steve [name]  LIVING WILL AND CODE STATUS: ?  INTERVAL HISTORY: Steve [name] 73 y.o. male returns to the clinic today for followup visit accompanied by his wife. He is tolerating in the immunotherapy with Nivolumab relatively well. Denied skin rash or diarrhea. He does note a sensation of feeling like he has "no air". He is short of breath with any exertion. He is okay at rest and with sleeping. The shortness breath with exertion is sometimes associated with some dizziness. He's had no falls or  near falls. He said some nausea without vomiting. The neuropathy affecting his hands and feet is relatively stable. The discomfort is worse in his feet. He reports decreased appetite and feels as though his "taste buds are off". He said some night sweats but has not checked his temperature. Of note he feels the increase in shortness of breath started with the initiation of his immunotherapy with Nivolumab. He is here today to start cycle #3.  MEDICAL HISTORY: Past Medical History  Diagnosis Date  . Dizziness and giddiness     positional vertigo  . Hyperlipidemia   . Impotence of organic origin   . Osteoarthritis, shoulder   . Allergy   . Hypothyroidism     pt denies thryoid disease, dr pandey note 11-19-12 says subclinical hypothryoid in epic  . COPD (chronic obstructive pulmonary disease)   . Insomnia   . Malignant neoplasm of bladder, part unspecified   . Malignant neoplasm of bronchus and lung, unspecified site   . Diabetes mellitus without complication     pt denies diabetes, dr pandey note says dm 11-19-12 epic    ALLERGIES:  is allergic to morphine and related.  MEDICATIONS:  Current Outpatient Prescriptions  Medication Sig Dispense Refill  . aspirin 325 MG tablet Take 325 mg by mouth daily.      Marland Kitchen gabapentin (NEURONTIN) 300 MG capsule TAKE 1 CAPSULE BY MOUTH THREE TIMES DAILY  270 capsule  0  . oxyCODONE-acetaminophen (ROXICET) 5-325 MG per tablet Take 1 tablet by mouth every 6 (six) hours as needed for severe pain.  120 tablet  0  . simvastatin (ZOCOR) 20 MG tablet Take  one tablet by mouth once daily to lower cholesterol  90 tablet  3  . zolpidem (AMBIEN) 10 MG tablet TAKE 1 TABLET BY MOUTH EVERY NIGHT AT BEDTIME FOR REST  30 tablet  0   No current facility-administered medications for this visit.    SURGICAL HISTORY:  Past Surgical History  Procedure Laterality Date  . Cystoscopy w/ dilation of bladder    . Tonsillectomy  age 35  . Endobronchial ultrasound Bilateral  12/15/2012    Procedure: ENDOBRONCHIAL ULTRASOUND;  Surgeon: Brand Males, MD;  Location: [facility];  Service: Cardiopulmonary;  Laterality: Bilateral;  . Colonoscopy  2009    [facility]    REVIEW OF SYSTEMS:  Constitutional: negative Eyes: negative Ears, nose, mouth, throat, and face: negative Respiratory: positive for cough, dyspnea on exertion and increased shortness of breath Cardiovascular: negative Gastrointestinal: negative Genitourinary:negative Integument/breast: negative Hematologic/lymphatic: negative Musculoskeletal:negative Neurological: positive for paresthesia Behavioral/Psych: negative Endocrine: negative Allergic/Immunologic: negative   PHYSICAL EXAMINATION: General appearance: alert, cooperative and no distress Head: Normocephalic, without obvious abnormality, atraumatic Neck: no adenopathy, no JVD, supple, symmetrical, trachea midline and thyroid not enlarged, symmetric, no tenderness/mass/nodules Lymph nodes: Cervical, supraclavicular, and axillary nodes normal. Resp: clear to auscultation bilaterally Back: symmetric, no curvature. ROM normal. No CVA tenderness. Cardio: regular rate and rhythm, S1, S2 normal, no murmur, click, rub or gallop GI: soft, non-tender; bowel sounds normal; no masses,  no organomegaly Extremities: extremities normal, atraumatic, no cyanosis or edema Neurologic: Alert and oriented X 3, normal strength and tone. Normal symmetric reflexes. Normal coordination and gait  ECOG PERFORMANCE STATUS: 1 - Symptomatic but completely ambulatory  Blood pressure 119/53, pulse 56, temperature 97.7 F (36.5 C), temperature source Oral, resp. rate 20, height 6\' 1"  (1.854 m), weight 187 lb (84.823 kg), SpO2 97.00%.  LABORATORY DATA: Lab Results  Component Value Date   WBC 8.9 10/20/2013   HGB 11.4* 10/20/2013   HCT 35.7* 10/20/2013   MCV 84.7 10/20/2013   PLT 670* 10/20/2013      Chemistry      Component Value Date/Time   NA 141  10/20/2013 1324   NA 142 04/28/2013 0819   K 4.0 10/20/2013 1324   K 5.1 04/28/2013 0819   CL 103 04/28/2013 0819   CO2 25 10/20/2013 1324   CO2 24 04/28/2013 0819   BUN 17.8 10/20/2013 1324   BUN 12 04/28/2013 0819   CREATININE 0.9 10/20/2013 1324   CREATININE 0.87 04/28/2013 0819      Component Value Date/Time   CALCIUM 10.0 10/20/2013 1324   CALCIUM 9.9 04/28/2013 0819   ALKPHOS 80 10/20/2013 1324   ALKPHOS 107 04/28/2013 0819   AST 23 10/20/2013 1324   AST 19 04/28/2013 0819   ALT 34 10/20/2013 1324   ALT 12 04/28/2013 0819   BILITOT 0.27 10/20/2013 1324   BILITOT 0.4 04/28/2013 0819       RADIOGRAPHIC STUDIES:  ASSESSMENT AND PLAN: This is a very pleasant 72 years old [place] male with:   1) metastatic non-small cell lung cancer initially diagnosed as stage IIIA non-small cell lung cancer who completed a course of concurrent chemoradiation with weekly carboplatin and paclitaxel with no significant complaints except for radiation induced odynophagia.  He also completed 3 cycles of consolidation chemotherapy with reduced dose carboplatin and paclitaxel. He is currently undergoing immunotherapy with Nivolumab status post 2 cycles. Overall he's tolerating the treatment relatively well. Patient was discussed with and also seen by Dr. Julien Nordmann. However given his complaints of increased shortness  of breath which temporarily he states started when he used beginning treatment with Nivolumab, we will hold his treatment scheduled for today.Burnis Medin schedule patient for a CT angiogram to evaluate him for possible pneumonitis versus pulmonary embolism as a cause for his perceived increase shortness of breath. We'll also refer him back to Dr. Chase Caller for evaluation as well. She'll followup in one week to discuss results of the CT angiogram and if it is negative resume treatment with Nivolumab.   2) peripheral neuropathy: The patient will continue his current treatment with Neurontin in addition to  oxycodone as needed.   He was advised to call immediately if he has any concerning symptoms in the interval.  The patient voices understanding of current disease status and treatment options and is in agreement with the current care plan.  All questions were answered. The patient knows to call the clinic with any problems, questions or concerns. We can certainly see the patient much sooner if necessary.  Carlton Adam PA-C  ADDENDUM: Hematology/Oncology Attending: I had a face to face encounter with the patient. I recommended his care plan. This is a very pleasant 73 years old [place] male with metastatic non-small cell lung cancer, squamous cell carcinoma. The patient is currently on treatment with immunotherapy with no pleural metastases status post 2 cycles. He came today complaining of increasing shortness of breath over the last few weeks. He denied having any fever or chills and no significant chest pain. I recommended for the patient to have CT angiogram of the chest to rule out any pulmonary embolism or immune mediated pneumonitis or other etiology for his persistent dyspnea. I will delay the start of cycle #3 of his chemotherapy until after the CT scan of the chest to rule out pneumonitis. He would come back for followup visit in one week for evaluation before resuming his immunotherapy. He was advised to call immediately if he has any concerning symptoms in the interval. Disclaimer: This note was dictated with voice recognition software. Similar sounding words can inadvertently be transcribed and may not be corrected upon review. Eilleen Kempf., MD 10/25/2013

## 2013-10-20 NOTE — Telephone Encounter (Signed)
Pt confirmed labs/ov and cancelled chemo per 10/13 and sent msg to cancel also pt went to radiology to sch Ct, pt is scheduled.... KJ

## 2013-10-21 ENCOUNTER — Telehealth: Payer: Self-pay | Admitting: Medical Oncology

## 2013-10-21 ENCOUNTER — Other Ambulatory Visit: Payer: Self-pay | Admitting: *Deleted

## 2013-10-21 ENCOUNTER — Telehealth: Payer: Self-pay | Admitting: *Deleted

## 2013-10-21 ENCOUNTER — Encounter: Payer: Self-pay | Admitting: Physician Assistant

## 2013-10-21 MED ORDER — SIMVASTATIN 20 MG PO TABS: ORAL_TABLET | ORAL | Status: DC

## 2013-10-21 NOTE — Telephone Encounter (Signed)
Requests results of Ct . When is next chemo? Note to Clarita.

## 2013-10-21 NOTE — Progress Notes (Signed)
Spoke with patient regarding the results of his CT angiogram period was negative for pulmonary embolus as well as the negative for pneumonitis. There was some fluid and offered patient thoracentesis. He would like to hold off on this procedure. He does want to be referred back to Dr. Chase Caller for evaluation of possible underlying COPD. We will refer the patient to Dr. Chase Caller. You'll followup one week as previously scheduled to resume his immunotherapy with Nivolumab.  Wynetta Emery, Cordie Beazley E, PA-C

## 2013-10-21 NOTE — Telephone Encounter (Signed)
Per staff message and POF I have scheduled appts. Advised scheduler of appts. JMW  

## 2013-10-21 NOTE — Telephone Encounter (Signed)
Patient Requested and faxed to pharmacy. 

## 2013-10-21 NOTE — Telephone Encounter (Signed)
Awilda Metro took care of this.

## 2013-10-23 NOTE — Patient Instructions (Signed)
Your treatment with Nivolumab is being held today in order to evaluate your complaints of increased shortness of breath. You'll be scheduled for a CT angiogram for further evaluation. Followup in one week We'll also refer you to Dr. Chase Caller for further evaluation as well

## 2013-10-27 ENCOUNTER — Telehealth: Payer: Self-pay | Admitting: Internal Medicine

## 2013-10-27 ENCOUNTER — Ambulatory Visit: Payer: Medicare HMO

## 2013-10-27 ENCOUNTER — Encounter: Payer: Self-pay | Admitting: Internal Medicine

## 2013-10-27 ENCOUNTER — Ambulatory Visit (HOSPITAL_BASED_OUTPATIENT_CLINIC_OR_DEPARTMENT_OTHER): Payer: Medicare HMO | Admitting: Internal Medicine

## 2013-10-27 ENCOUNTER — Other Ambulatory Visit (HOSPITAL_BASED_OUTPATIENT_CLINIC_OR_DEPARTMENT_OTHER): Payer: Medicare HMO

## 2013-10-27 VITALS — BP 110/75 | HR 95 | Temp 98.0°F | Resp 18 | Ht 73.0 in | Wt 186.5 lb

## 2013-10-27 DIAGNOSIS — G629 Polyneuropathy, unspecified: Secondary | ICD-10-CM

## 2013-10-27 DIAGNOSIS — R42 Dizziness and giddiness: Secondary | ICD-10-CM

## 2013-10-27 DIAGNOSIS — C3491 Malignant neoplasm of unspecified part of right bronchus or lung: Secondary | ICD-10-CM

## 2013-10-27 DIAGNOSIS — C3431 Malignant neoplasm of lower lobe, right bronchus or lung: Secondary | ICD-10-CM

## 2013-10-27 LAB — CBC WITH DIFFERENTIAL/PLATELET
BASO%: 0.7 % (ref 0.0–2.0)
BASOS ABS: 0.1 10*3/uL (ref 0.0–0.1)
EOS%: 2.3 % (ref 0.0–7.0)
Eosinophils Absolute: 0.2 10*3/uL (ref 0.0–0.5)
HCT: 35 % — ABNORMAL LOW (ref 38.4–49.9)
HEMOGLOBIN: 11 g/dL — AB (ref 13.0–17.1)
LYMPH%: 12 % — ABNORMAL LOW (ref 14.0–49.0)
MCH: 26.5 pg — ABNORMAL LOW (ref 27.2–33.4)
MCHC: 31.6 g/dL — ABNORMAL LOW (ref 32.0–36.0)
MCV: 83.8 fL (ref 79.3–98.0)
MONO#: 1 10*3/uL — AB (ref 0.1–0.9)
MONO%: 12 % (ref 0.0–14.0)
NEUT#: 5.8 10*3/uL (ref 1.5–6.5)
NEUT%: 73 % (ref 39.0–75.0)
Platelets: 686 10*3/uL — ABNORMAL HIGH (ref 140–400)
RBC: 4.17 10*6/uL — ABNORMAL LOW (ref 4.20–5.82)
RDW: 16.1 % — AB (ref 11.0–14.6)
WBC: 8 10*3/uL (ref 4.0–10.3)
lymph#: 1 10*3/uL (ref 0.9–3.3)

## 2013-10-27 LAB — COMPREHENSIVE METABOLIC PANEL (CC13)
ALBUMIN: 2.8 g/dL — AB (ref 3.5–5.0)
ALK PHOS: 72 U/L (ref 40–150)
ALT: 25 U/L (ref 0–55)
ANION GAP: 11 meq/L (ref 3–11)
AST: 17 U/L (ref 5–34)
BUN: 16.6 mg/dL (ref 7.0–26.0)
CALCIUM: 9.5 mg/dL (ref 8.4–10.4)
CHLORIDE: 107 meq/L (ref 98–109)
CO2: 23 mEq/L (ref 22–29)
Creatinine: 0.8 mg/dL (ref 0.7–1.3)
GLUCOSE: 108 mg/dL (ref 70–140)
Potassium: 4 mEq/L (ref 3.5–5.1)
Sodium: 141 mEq/L (ref 136–145)
TOTAL PROTEIN: 7.3 g/dL (ref 6.4–8.3)
Total Bilirubin: 0.21 mg/dL (ref 0.20–1.20)

## 2013-10-27 MED ORDER — DULOXETINE HCL 60 MG PO CPEP: 60.0000 mg | ORAL_CAPSULE | Freq: Every day | ORAL | Status: DC

## 2013-10-27 NOTE — Progress Notes (Signed)
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   Primary site: Lung (Right)  Staging method: AJCC 7th Edition  Clinical: Stage IIIA (T2b, N2, M0)  Summary: Stage IIIA (T2b, N2, M0)   PRIOR THERAPY:  1) Concurrent chemoradiation with weekly carboplatin for an AUC of 2 and paclitaxel 45 mg/m2, status post 4 cycles. 2) Consolidation chemotherapy with carboplatin for AUC of 5 and paclitaxel 175 mg/M2 every 3 weeks with Neulasta support, first cycle on 03/23/2013. Status post 3 cycles.  CURRENT THERAPY: Immunotherapy with Nivolumab 3 mg/KG every 2 weeks. First dose 09/21/2013. Status post 2 cycles.  DISEASE STAGE:  Lung cancer  Primary site: Lung (Right)  Staging method: AJCC 7th Edition  Clinical: Stage IIIA (T2b, N2, M0)  Summary: Stage IIIA (T2b, N2, M0)  CHEMOTHERAPY INTENT: Control/Palliative  CURRENT # OF CHEMOTHERAPY CYCLES: 2 CURRENT ANTIEMETICS: Zofran, dexamethasone, Compazine  CURRENT SMOKING STATUS: Former smoker, quit 01/09/2007  ORAL CHEMOTHERAPY AND CONSENT: n/a  CURRENT BISPHOSPHONATES USE: none  PAIN MANAGEMENT: Ibuprofen  NARCOTICS INDUCED CONSTIPATION: None  LIVING WILL AND CODE STATUS: ?  INTERVAL HISTORY: JIMMEY HENGEL 73 y.o. male returns to the clinic today for followup visit accompanied by his wife. He started treatment with Nivolumab for 2 cycles and tolerated it fairly well but for the last week or so the patient has been complaining of increasing shortness of breath and fatigue as well as dizzy spells. He had CT angiogram of the chest performed last week which showed collapse of the right lower lobe in addition to right pleural  effusion. He also has peripheral neuropathy and he takes Neurontin 600 mg by mouth in the morning and 300 mg in the evening. He denied having any significant chest pain but continues to have shortness of breath with exertion with mild cough without hemoptysis He denied having any weight loss or night sweats. The patient has no fever or chills and no nausea or vomiting.   MEDICAL HISTORY: Past Medical History  Diagnosis Date  . Dizziness and giddiness     positional vertigo  . Hyperlipidemia   . Impotence of organic origin   . Osteoarthritis, shoulder   . Allergy   . Hypothyroidism     pt denies thryoid disease, dr pandey note 11-19-12 says subclinical hypothryoid in epic  . COPD (chronic obstructive pulmonary disease)   . Insomnia   . Malignant neoplasm of bladder, part unspecified   . Malignant neoplasm of bronchus and lung, unspecified site   . Diabetes mellitus without complication     pt denies diabetes, dr pandey note says dm 11-19-12 epic    ALLERGIES:  is allergic to morphine and related.  MEDICATIONS:  Current Outpatient Prescriptions  Medication Sig Dispense Refill  . aspirin 325 MG tablet Take 325 mg by mouth daily.      Marland Kitchen gabapentin (NEURONTIN) 300 MG capsule TAKE 1 CAPSULE BY MOUTH THREE TIMES DAILY  270 capsule  0  . oxyCODONE-acetaminophen (ROXICET) 5-325 MG per tablet Take 1 tablet by mouth every 6 (six) hours as needed for severe pain.  120 tablet  0  . simvastatin (ZOCOR) 20 MG tablet Take one tablet by mouth once daily to lower cholesterol  90 tablet  3  . zolpidem (AMBIEN) 10 MG tablet TAKE 1 TABLET BY MOUTH EVERY NIGHT AT BEDTIME FOR REST  30 tablet  0   No current facility-administered medications for this visit.    SURGICAL HISTORY:  Past Surgical History  Procedure Laterality Date  . Cystoscopy w/ dilation of bladder    . Tonsillectomy  age 64  . Endobronchial ultrasound Bilateral 12/15/2012    Procedure: ENDOBRONCHIAL ULTRASOUND;  Surgeon: Brand Males, MD;  Location: [facility];  Service: Cardiopulmonary;  Laterality: Bilateral;  . Colonoscopy  2009    [facility]    REVIEW OF SYSTEMS:  Constitutional: negative Eyes: negative Ears, nose, mouth, throat, and face: negative Respiratory: positive for cough, dyspnea on exertion and wheezing Cardiovascular: negative Gastrointestinal: negative Genitourinary:negative Integument/breast: negative Hematologic/lymphatic: negative Musculoskeletal:negative Neurological: positive for paresthesia Behavioral/Psych: negative Endocrine: negative Allergic/Immunologic: negative   PHYSICAL EXAMINATION: General appearance: alert, cooperative and no distress Head: Normocephalic, without obvious abnormality, atraumatic Neck: no adenopathy, no JVD, supple, symmetrical, trachea midline and thyroid not enlarged, symmetric, no tenderness/mass/nodules Lymph nodes: Cervical, supraclavicular, and axillary nodes normal. Resp: clear to auscultation bilaterally Back: symmetric, no curvature. ROM normal. No CVA tenderness. Cardio: regular rate and rhythm, S1, S2 normal, no murmur, click, rub or gallop GI: soft, non-tender; bowel sounds normal; no masses,  no organomegaly Extremities: extremities normal, atraumatic, no cyanosis or edema Neurologic: Alert and oriented X 3, normal strength and tone. Normal symmetric reflexes. Normal coordination and gait  ECOG PERFORMANCE STATUS: 1 - Symptomatic but completely ambulatory  Blood pressure 110/75, pulse 95, temperature 98 F (36.7 C), temperature source Oral, resp. rate 18, height 6\' 1"  (1.854 m), weight 186 lb 8 oz (84.596 kg), SpO2 95.00%.  LABORATORY DATA: Lab Results  Component Value Date   WBC 8.0 10/27/2013   HGB 11.0* 10/27/2013   HCT 35.0* 10/27/2013   MCV 83.8 10/27/2013   PLT 686* 10/27/2013      Chemistry      Component Value Date/Time   NA 141 10/20/2013 1324   NA 142 04/28/2013 0819   K 4.0 10/20/2013 1324   K 5.1 04/28/2013 0819     CL 103 04/28/2013 0819   CO2 25 10/20/2013 1324   CO2 24 04/28/2013 0819   BUN 17.8 10/20/2013 1324   BUN 12 04/28/2013 0819   CREATININE 0.9 10/20/2013 1324   CREATININE 0.87 04/28/2013 0819      Component Value Date/Time   CALCIUM 10.0 10/20/2013 1324   CALCIUM 9.9 04/28/2013 0819   ALKPHOS 80 10/20/2013 1324   ALKPHOS 107 04/28/2013 0819   AST 23 10/20/2013 1324   AST 19 04/28/2013 0819   ALT 34 10/20/2013 1324   ALT 12 04/28/2013 0819   BILITOT 0.27 10/20/2013 1324   BILITOT 0.4 04/28/2013 0819       RADIOGRAPHIC STUDIES: Ct Angio Chest Pe W/cm &/or Wo Cm  10/20/2013   CLINICAL DATA:  Acute onset shortness of breath. Known lung carcinoma  EXAM: CT ANGIOGRAPHY CHEST WITH CONTRAST  TECHNIQUE: Multidetector CT imaging of the chest was performed using the standard protocol during bolus administration of intravenous contrast. Multiplanar CT image reconstructions and MIPs were obtained to evaluate the vascular anatomy.  CONTRAST:  127mL OMNIPAQUE IOHEXOL 350 MG/ML SOLN  COMPARISON:  PET-CT September 02, 2013  FINDINGS: There is no demonstrable pulmonary embolus. There is atherosclerotic change in the aorta but no aneurysm or dissection.  There is a sizable right pleural effusion with extensive right lower lobe consolidation.  There is obstruction of the right lower lobe bronchus from a known mass which currently is surrounded and obscured by consolidation. There is also patchy infiltrate in the posterior segment of the right upper lobe and medial segment of the right middle lobe.  There is mild scarring in the medial aspect of the left upper lobe anteriorly. Left lung is otherwise clear.  There is again noted sub- carinal adenopathy with an enlarged lymph node measuring 2.2 x 1.8 cm in this region. There are small mediastinal lymph nodes elsewhere which do not meet size criteria for pathologic significance.  Pericardium is not appreciably thickened.  In the visualized upper abdomen there is hepatic  steatosis. There is degenerative change in the thoracic spine. There are no blastic or lytic bone lesions. Visualized thyroid appears normal.  Review of the MIP images confirms the above findings.  IMPRESSION: No demonstrable pulmonary embolus.  Extensive right lower lobe consolidation with obstruction of the right lower lobe bronchus. The known mass in the right lower lobe is obscured by the consolidation. There is patchy infiltrate elsewhere on the right, primarily in the medial segment right middle and posterior segment right upper lobe regions. There is mild scarring on the left medially in the upper lobe region. Left lung is otherwise clear. There is a sizable effusion on the right.  There is sub- carinal adenopathy, documented previously.  There is hepatic steatosis.   Electronically Signed   By: Lowella Grip M.D.   On: 10/20/2013 16:21   ASSESSMENT AND PLAN: This is a very pleasant 73 years old [place] male with:   1) metastatic non-small cell lung cancer initially diagnosed as stage IIIA non-small cell lung cancer who completed a course of concurrent chemoradiation with weekly carboplatin and paclitaxel with no significant complaints except for radiation induced odynophagia.  He also completed 3 cycles of consolidation chemotherapy with reduced dose carboplatin and paclitaxel. He is currently undergoing immunotherapy with Nivolumab status post  2 cycles. His immunotherapy he is currently on hold secondary to worsening dyspnea and concern about immune mediated pneumonitis. The recent CT angiogram of the chest showed no evidence for pneumonitis but the patient has extensive right lower lobe consolidation is obstruction of the right lower lobe bronchus and a new right pleural effusion. I had a lengthy discussion with the patient and his wife about his condition and I showed them the images of the CT scan of the chest. I recommended for him to consider a right-sided ultrasound-guided thoracentesis for  drainage of the right pleural effusion. I may also consider referring the patient to Dr. Sondra Come for reevaluation and consideration of short course of palliative radiotherapy to the central obstructing right lower lobe mass. I will delay the start of cycle #3 by 1 week.  2) peripheral neuropathy: The patient will continue his current treatment with Neurontin but will decrease the dose to 300 mg by mouth 3 times a day in addition to oxycodone as needed. I also started the patient on Cymbalta 60 mg by mouth daily.  3) dizzy spells: This could be secondary to his high dose of Neurontin that the patient has been taking in the morning. I recommended for him to drop the dose of Neurontin to 300 mg rather than 600. I also gave him the option of proceeding with MRI of the brain to rule out brain metastasis but the patient would like to hold on this for now. He would come back for followup visit in 3 weeks for reevaluation or  sooner if needed.  He was advised to call immediately if he has any concerning symptoms in the interval.  The patient voices understanding of current disease status and treatment options and is in agreement with the current care plan.  All questions were answered. The patient knows to call the clinic with any problems, questions or concerns. We can certainly see the patient much sooner if necessary.  Disclaimer: This note was dictated with voice recognition software. Similar sounding words can inadvertently be transcribed and may not be corrected upon review.

## 2013-10-27 NOTE — Telephone Encounter (Signed)
Pt confirmed labs/ov per 10/20 POF, sent msg to r/s chemo on 11/03, also sch pt for U/S, gave pt AVS.... Cherylann Banas

## 2013-10-28 ENCOUNTER — Ambulatory Visit (HOSPITAL_COMMUNITY)
Admission: RE | Admit: 2013-10-28 | Discharge: 2013-10-28 | Disposition: A | Discharge status: Home / Self Care | Payer: Medicare HMO | Source: Ambulatory Visit | Attending: Radiology | Admitting: Radiology

## 2013-10-28 ENCOUNTER — Telehealth: Payer: Self-pay | Admitting: *Deleted

## 2013-10-28 ENCOUNTER — Ambulatory Visit (HOSPITAL_COMMUNITY)
Admission: RE | Admit: 2013-10-28 | Discharge: 2013-10-28 | Disposition: A | Discharge status: Home / Self Care | Payer: Medicare HMO | Source: Ambulatory Visit | Attending: Internal Medicine | Admitting: Internal Medicine

## 2013-10-28 DIAGNOSIS — J90 Pleural effusion, not elsewhere classified: Secondary | ICD-10-CM | POA: Insufficient documentation

## 2013-10-28 DIAGNOSIS — Z85118 Personal history of other malignant neoplasm of bronchus and lung: Secondary | ICD-10-CM | POA: Insufficient documentation

## 2013-10-28 DIAGNOSIS — Z9889 Other specified postprocedural states: Secondary | ICD-10-CM

## 2013-10-28 DIAGNOSIS — C3431 Malignant neoplasm of lower lobe, right bronchus or lung: Secondary | ICD-10-CM

## 2013-10-28 NOTE — Procedures (Signed)
Successful US guided right thoracentesis. Yielded 1.5L of clear yellow fluid. Pt tolerated procedure well. No immediate complications.  Specimen was not sent for labs. CXR ordered.  Ascencion Dike PA-C 10/28/2013 1:37 PM

## 2013-10-28 NOTE — Progress Notes (Signed)
Thoracic Location of Tumor / Histology: Metastatic non-small cell lung cancer initially diagnosed as Stage IIIA (T2 B., N2, M0) non-small cell lung cancer consistent with poorly differentiated squamous cell carcinoma diagnosed in December of 2014.  Patient presented on CT of chest from 10/20/13 with extensive right lower lobe consolidation with obstruction of the right lower lobe bronchus and a new right pleural effusion.  Tobacco/Marijuana/Snuff/ETOH use: smoked 1.5 ppd for 50 years, quit 01/09/2007  Past/Anticipated interventions by cardiothoracic surgery, if any: 10/28/13 right-sided ultrasound-guided thoracentesis with drainage of 1.5 L of pleural fluid  Past/Anticipated interventions by medical oncology, if any: Concurrent chemoradiation with weekly carboplatin for an AUC of 2 and paclitaxel 45 mg/m2, status post 4 cycles 2) Consolidation chemotherapy with carboplatin for AUC of 5 and paclitaxel 175 mg/M2 every 3 weeks with Neulasta support, first cycle on 03/23/2013. Status post 3 cycles. CURRENT THERAPY: Immunotherapy with Nivolumab 3 mg/KG every 2 weeks. First dose 09/21/2013. Status post 2 cycles.  Signs/Symptoms  Weight changes, if any: "taste buds shot"  Respiratory complaints, if any: none  Hemoptysis, if any: no  Pain issues, if any:  Generalized muscle aches  SAFETY ISSUES: Prior radiation? December 29 through February 12, 2013  Right central chest 50.4 gray in 28 fractions  Pacemaker/ICD?  no  Possible current pregnancy? na  Is the patient on methotrexate? no  Current Complaints / other details:  Patient had thoracentesis yesterday, states he is breathing much better today. He reports generalized muscle aches, neuropathy of feet and hands, cold feet he attributes to chemotherapy. He states he takes Oxycodone 1 tab with good relief of muscle aches. He states he still has some difficulty eating due to esophagitis during previous radiation treatment. He chews thoroughly, eats  slowly, avoids certain foods such as breads.

## 2013-10-28 NOTE — Telephone Encounter (Signed)
Per staff message and POF I have scheduled appts. Advised scheduler of appts. JMW  

## 2013-10-29 ENCOUNTER — Ambulatory Visit
Admission: RE | Admit: 2013-10-29 | Discharge: 2013-10-29 | Disposition: A | Discharge status: Home / Self Care | Payer: Medicare HMO | Source: Ambulatory Visit | Attending: Radiation Oncology | Admitting: Radiation Oncology

## 2013-10-29 ENCOUNTER — Encounter: Payer: Self-pay | Admitting: *Deleted

## 2013-10-29 VITALS — BP 129/77 | HR 103 | Temp 97.9°F | Resp 20 | Wt 184.9 lb

## 2013-10-29 DIAGNOSIS — C3431 Malignant neoplasm of lower lobe, right bronchus or lung: Secondary | ICD-10-CM | POA: Diagnosis not present

## 2013-10-29 DIAGNOSIS — Z510 Encounter for antineoplastic radiation therapy: Secondary | ICD-10-CM | POA: Insufficient documentation

## 2013-10-29 HISTORY — DX: Personal history of irradiation: Z92.3

## 2013-10-29 NOTE — Progress Notes (Signed)
Radiation Oncology         [phone] ________________________________  Name: Steve [name] MRN: [id number]  Date: 10/29/2013  DOB: March 15, 1940   Re-Evaluation Note  CC: [name], MD  Curt Bears, MD    ICD-9-CM ICD-10-CM  1. Malignant neoplasm of lower lobe of right lung 162.5 C34.31  2. Primary cancer of right lower lobe of lung 162.5 C34.31    Diagnosis:  Metastatic non-small cell lung cancer,  initially diagnosed as Stage IIIA (T2 B., N2, M0) non-small cell lung cancer; now with left upper lobe pulmonary metastasis new right pleural effusion and collapse of right lower lung    Interval Since Last Radiation:  9  months, the patient completed 50.4 gray to the right central chest area along with radiosensitizing chemotherapy. He completed a limited course of treatment in light of lung dose volume issues.  Narrative:  The patient returns today for further evaluation. The patient completed 50.4 gray along with radiosensitizing chemotherapy 02/12/2013. Patient had significant esophagitis symptoms for several weeks after his radiation therapy.  At a later date the patient completed  consolidation chemotherapy with carboplatinum and paclitaxel. He is currently on immunotherapy with Nivolumab.  The patient recently presented with shortness of breath. A chest CT scan was performed which revealed a significant large right pleural effusion and collapse of the right lower lobe. The patient prior to this scan did have a PET scan documenting recurrence within the right infrahilar region subcarinal area and right hilar area. Patient was also noted to have a hypermetabolic left upper lobe pulmonary nodule. No evidence of extrathoracic recurrence on the patient's PET scan.  The patient did undergo a therapeutic thoracentesis this week which is improved his breathing significantly.  Approximately 1.5 L of fluid were removed from the right chest. Patient now presents consideration for additional  radiation therapy in light of his right lower lobe collapse.                     ALLERGIES:  is allergic to morphine and related.  Meds: Current Outpatient Prescriptions  Medication Sig Dispense Refill  . aspirin 81 MG tablet Take 81 mg by mouth daily.      . DULoxetine (CYMBALTA) 60 MG capsule Take 1 capsule (60 mg total) by mouth daily.  30 capsule  3  . gabapentin (NEURONTIN) 300 MG capsule TAKE 1 CAPSULE BY MOUTH THREE TIMES DAILY  270 capsule  0  . oxyCODONE-acetaminophen (ROXICET) 5-325 MG per tablet Take 1 tablet by mouth every 6 (six) hours as needed for severe pain.  120 tablet  0  . simvastatin (ZOCOR) 20 MG tablet Take one tablet by mouth once daily to lower cholesterol  90 tablet  3  . zolpidem (AMBIEN) 10 MG tablet TAKE 1 TABLET BY MOUTH EVERY NIGHT AT BEDTIME FOR REST  30 tablet  0   No current facility-administered medications for this encounter.    Physical Findings: The patient is in no acute distress. Patient is alert and oriented.  weight is 184 lb 14.4 oz (83.87 kg). His oral temperature is 97.9 F (36.6 C). His blood pressure is 129/77 and his pulse is 103. His respiration is 20 and oxygen saturation is 98%. .  No palpable supraclavicular or axillary adenopathy. The lungs are clear except for decreased breath sounds in the right lower lung field. The heart has a regular rhythm and rate.  Lab Findings: Lab Results  Component Value Date   WBC 8.0 10/27/2013  HGB 11.0* 10/27/2013   HCT 35.0* 10/27/2013   MCV 83.8 10/27/2013   PLT 686* 10/27/2013    Radiographic Findings: Dg Chest 1 View  10/28/2013   CLINICAL DATA:  Status post right thoracentesis.  EXAM: CHEST - 1 VIEW  COMPARISON:  10/20/2013  FINDINGS: Cardiac shadow is stable. There is volume loss on the right related to the known central mass lesion and collapse of the right lower lobe. A small pleural effusion is noted although significantly improved from the prior CT examination. No post thoracentesis  pneumothorax is noted. The left lung remains clear. No acute bony abnormality is noted. Old rib fractures are seen on the right.  IMPRESSION: No evidence of pneumothorax following right thoracentesis.   Electronically Signed   By: Inez Catalina M.D.   On: 10/28/2013 13:48   Ct Angio Chest Pe W/cm &/or Wo Cm  10/20/2013   CLINICAL DATA:  Acute onset shortness of breath. Known lung carcinoma  EXAM: CT ANGIOGRAPHY CHEST WITH CONTRAST  TECHNIQUE: Multidetector CT imaging of the chest was performed using the standard protocol during bolus administration of intravenous contrast. Multiplanar CT image reconstructions and MIPs were obtained to evaluate the vascular anatomy.  CONTRAST:  119mL OMNIPAQUE IOHEXOL 350 MG/ML SOLN  COMPARISON:  PET-CT September 02, 2013  FINDINGS: There is no demonstrable pulmonary embolus. There is atherosclerotic change in the aorta but no aneurysm or dissection.  There is a sizable right pleural effusion with extensive right lower lobe consolidation. There is obstruction of the right lower lobe bronchus from a known mass which currently is surrounded and obscured by consolidation. There is also patchy infiltrate in the posterior segment of the right upper lobe and medial segment of the right middle lobe.  There is mild scarring in the medial aspect of the left upper lobe anteriorly. Left lung is otherwise clear.  There is again noted sub- carinal adenopathy with an enlarged lymph node measuring 2.2 x 1.8 cm in this region. There are small mediastinal lymph nodes elsewhere which do not meet size criteria for pathologic significance.  Pericardium is not appreciably thickened.  In the visualized upper abdomen there is hepatic steatosis. There is degenerative change in the thoracic spine. There are no blastic or lytic bone lesions. Visualized thyroid appears normal.  Review of the MIP images confirms the above findings.  IMPRESSION: No demonstrable pulmonary embolus.  Extensive right lower lobe  consolidation with obstruction of the right lower lobe bronchus. The known mass in the right lower lobe is obscured by the consolidation. There is patchy infiltrate elsewhere on the right, primarily in the medial segment right middle and posterior segment right upper lobe regions. There is mild scarring on the left medially in the upper lobe region. Left lung is otherwise clear. There is a sizable effusion on the right.  There is sub- carinal adenopathy, documented previously.  There is hepatic steatosis.   Electronically Signed   By: Lowella Grip M.D.   On: 10/20/2013 16:21   US Thoracentesis Asp Pleural Space W/img Guide  10/28/2013   CLINICAL DATA:  History of lung cancer, shortness of breath. Large right pleural effusion. Request therapeutic thoracentesis.  EXAM: ULTRASOUND GUIDED RIGHT THORACENTESIS  COMPARISON:  None.  PROCEDURE: An ultrasound guided thoracentesis was thoroughly discussed with the patient and questions answered. The benefits, risks, alternatives and complications were also discussed. The patient understands and wishes to proceed with the procedure. Written consent was obtained.  Ultrasound was performed to localize and mark an adequate  pocket of fluid in the right chest. The area was then prepped and draped in the normal sterile fashion. 1% Lidocaine was used for local anesthesia. Under ultrasound guidance a 19 gauge Yueh catheter was introduced. Thoracentesis was performed. The catheter was removed and a dressing applied.  COMPLICATIONS: None  FINDINGS: A total of approximately 1.5 L of clear yellow fluid was removed. A fluid sample was notsent for laboratory analysis.  IMPRESSION: Successful ultrasound guided right thoracentesis yielding 1.5 L of pleural fluid.  Read by: Ascencion Dike PA-C   Electronically Signed   By: Markus Daft M.D.   On: 10/28/2013 14:20    Impression:  Recurrent non-small cell lung cancer. As above the patient developed a large right pleural effusion and  right lower lobe collapse. His breathing is much better after therapeutic thoracentesis. I discussed palliative radiation therapy directed at the area of bronchial obstruction on the right lower lobe area with the patient.  we discussed the course of treatment side effects and potential toxicities.  At This time the patient is quite hesitant to consider retreatment given his significant esophageal symptoms with his prior radiation therapy and continued mild esophageal problems at this time.  Plan:  The patient will be set up for a CT scan of the chest with and without contrast to document his right lung status after his therapeutic thoracentesis. His case will be presented at the multidisciplinary thoracic conference next week to see the patient may be a candidate for laser therapy.  If the patient is not a candidate for this treatment,  then he may be more willing to consider palliative radiation therapy.  ____________________________________ Blair Promise, MD

## 2013-10-29 NOTE — Progress Notes (Signed)
Please see the Nurse Progress Note in the MD Initial Consult Encounter for this patient. 

## 2013-10-30 ENCOUNTER — Telehealth: Payer: Self-pay | Admitting: *Deleted

## 2013-10-30 ENCOUNTER — Other Ambulatory Visit: Payer: Self-pay | Admitting: Oncology

## 2013-10-30 DIAGNOSIS — C3431 Malignant neoplasm of lower lobe, right bronchus or lung: Secondary | ICD-10-CM

## 2013-10-30 NOTE — Telephone Encounter (Signed)
CALLED PATIENT TO INFORM OF TEST FOR 11-02-13, SPOKE WITH PATIENT AND HE IS AWARE OF THIS TEST

## 2013-10-30 NOTE — Telephone Encounter (Signed)
CALLED PATIENT TO INFORM OF TEST FOR 11-02-13, LVM FOR A RETURN CALL

## 2013-11-02 ENCOUNTER — Encounter (HOSPITAL_COMMUNITY): Payer: Self-pay

## 2013-11-02 ENCOUNTER — Other Ambulatory Visit: Payer: Self-pay | Admitting: Radiation Oncology

## 2013-11-02 ENCOUNTER — Ambulatory Visit (HOSPITAL_COMMUNITY)
Admission: RE | Admit: 2013-11-02 | Discharge: 2013-11-02 | Disposition: A | Discharge status: Home / Self Care | Payer: Medicare HMO | Source: Ambulatory Visit | Attending: Radiation Oncology | Admitting: Radiation Oncology

## 2013-11-02 DIAGNOSIS — C3431 Malignant neoplasm of lower lobe, right bronchus or lung: Secondary | ICD-10-CM | POA: Insufficient documentation

## 2013-11-02 DIAGNOSIS — Z923 Personal history of irradiation: Secondary | ICD-10-CM | POA: Diagnosis not present

## 2013-11-02 DIAGNOSIS — J90 Pleural effusion, not elsewhere classified: Secondary | ICD-10-CM | POA: Diagnosis not present

## 2013-11-02 DIAGNOSIS — Z9221 Personal history of antineoplastic chemotherapy: Secondary | ICD-10-CM | POA: Insufficient documentation

## 2013-11-02 MED ORDER — IOHEXOL 300 MG/ML  SOLN
80.0000 mL | Freq: Once | INTRAMUSCULAR | Status: AC | PRN
  Administered 2013-11-02: 80 mL via INTRAVENOUS

## 2013-11-03 ENCOUNTER — Other Ambulatory Visit (HOSPITAL_BASED_OUTPATIENT_CLINIC_OR_DEPARTMENT_OTHER): Payer: Medicare HMO

## 2013-11-03 ENCOUNTER — Ambulatory Visit (HOSPITAL_BASED_OUTPATIENT_CLINIC_OR_DEPARTMENT_OTHER): Payer: Medicare HMO

## 2013-11-03 VITALS — BP 124/67 | HR 92 | Temp 97.7°F | Resp 18

## 2013-11-03 DIAGNOSIS — C3431 Malignant neoplasm of lower lobe, right bronchus or lung: Secondary | ICD-10-CM

## 2013-11-03 DIAGNOSIS — C3490 Malignant neoplasm of unspecified part of unspecified bronchus or lung: Secondary | ICD-10-CM

## 2013-11-03 DIAGNOSIS — Z5112 Encounter for antineoplastic immunotherapy: Secondary | ICD-10-CM

## 2013-11-03 LAB — CBC WITH DIFFERENTIAL/PLATELET
BASO%: 1 % (ref 0.0–2.0)
Basophils Absolute: 0.1 10*3/uL (ref 0.0–0.1)
EOS%: 4.4 % (ref 0.0–7.0)
Eosinophils Absolute: 0.4 10*3/uL (ref 0.0–0.5)
HCT: 36.5 % — ABNORMAL LOW (ref 38.4–49.9)
HGB: 11.6 g/dL — ABNORMAL LOW (ref 13.0–17.1)
LYMPH%: 7.9 % — ABNORMAL LOW (ref 14.0–49.0)
MCH: 26.6 pg — ABNORMAL LOW (ref 27.2–33.4)
MCHC: 31.8 g/dL — AB (ref 32.0–36.0)
MCV: 83.7 fL (ref 79.3–98.0)
MONO#: 0.7 10*3/uL (ref 0.1–0.9)
MONO%: 7.2 % (ref 0.0–14.0)
NEUT#: 7.3 10*3/uL — ABNORMAL HIGH (ref 1.5–6.5)
NEUT%: 79.5 % — ABNORMAL HIGH (ref 39.0–75.0)
PLATELETS: 705 10*3/uL — AB (ref 140–400)
RBC: 4.37 10*6/uL (ref 4.20–5.82)
RDW: 15.9 % — ABNORMAL HIGH (ref 11.0–14.6)
WBC: 9.2 10*3/uL (ref 4.0–10.3)
lymph#: 0.7 10*3/uL — ABNORMAL LOW (ref 0.9–3.3)

## 2013-11-03 LAB — COMPREHENSIVE METABOLIC PANEL (CC13)
ALK PHOS: 74 U/L (ref 40–150)
ALT: 25 U/L (ref 0–55)
AST: 17 U/L (ref 5–34)
Albumin: 2.8 g/dL — ABNORMAL LOW (ref 3.5–5.0)
Anion Gap: 11 mEq/L (ref 3–11)
BILIRUBIN TOTAL: 0.32 mg/dL (ref 0.20–1.20)
BUN: 16.5 mg/dL (ref 7.0–26.0)
CO2: 24 mEq/L (ref 22–29)
Calcium: 9.7 mg/dL (ref 8.4–10.4)
Chloride: 107 mEq/L (ref 98–109)
Creatinine: 0.8 mg/dL (ref 0.7–1.3)
Glucose: 149 mg/dl — ABNORMAL HIGH (ref 70–140)
Potassium: 3.9 mEq/L (ref 3.5–5.1)
SODIUM: 141 meq/L (ref 136–145)
TOTAL PROTEIN: 7.2 g/dL (ref 6.4–8.3)

## 2013-11-03 MED ORDER — SODIUM CHLORIDE 0.9 % IV SOLN
Freq: Once | INTRAVENOUS | Status: AC
  Administered 2013-11-03: 10:00:00 via INTRAVENOUS

## 2013-11-03 MED ORDER — SODIUM CHLORIDE 0.9 % IV SOLN
3.0000 mg/kg | Freq: Once | INTRAVENOUS | Status: AC
  Administered 2013-11-03: 260 mg via INTRAVENOUS
  Filled 2013-11-03: qty 26

## 2013-11-03 NOTE — Patient Instructions (Signed)
[facility] Discharge Instructions for Patients Receiving Chemotherapy  Today you received the following chemotherapy agents Nivolumab.  To help prevent nausea and vomiting after your treatment, we encourage you to take your nausea medication ( none ordered) Call MD office if needed   If you develop nausea and vomiting that is not controlled by your nausea medication, call the clinic.   BELOW ARE SYMPTOMS THAT SHOULD BE REPORTED IMMEDIATELY:  *FEVER GREATER THAN 100.5 F  *CHILLS WITH OR WITHOUT FEVER  NAUSEA AND VOMITING THAT IS NOT CONTROLLED WITH YOUR NAUSEA MEDICATION  *UNUSUAL SHORTNESS OF BREATH  *UNUSUAL BRUISING OR BLEEDING  TENDERNESS IN MOUTH AND THROAT WITH OR WITHOUT PRESENCE OF ULCERS  *URINARY PROBLEMS  *BOWEL PROBLEMS  UNUSUAL RASH Items with * indicate a potential emergency and should be followed up as soon as possible.  Feel free to call the clinic you have any questions or concerns. The clinic phone number is [phone].

## 2013-11-09 ENCOUNTER — Telehealth: Payer: Self-pay | Admitting: *Deleted

## 2013-11-09 ENCOUNTER — Other Ambulatory Visit: Payer: Self-pay | Admitting: *Deleted

## 2013-11-09 DIAGNOSIS — G894 Chronic pain syndrome: Secondary | ICD-10-CM

## 2013-11-09 DIAGNOSIS — C3431 Malignant neoplasm of lower lobe, right bronchus or lung: Secondary | ICD-10-CM

## 2013-11-09 MED ORDER — LIDOCAINE-PRILOCAINE 2.5-2.5 % EX CREA: 1.0000 "application " | TOPICAL_CREAM | CUTANEOUS | Status: DC | PRN

## 2013-11-09 MED ORDER — OXYCODONE-ACETAMINOPHEN 5-325 MG PO TABS: 1.0000 | ORAL_TABLET | Freq: Four times a day (QID) | ORAL | Status: DC | PRN

## 2013-11-09 NOTE — Telephone Encounter (Signed)
Pt is requesting a port, ok per Dr Vista Mink, educated pt regarding EMLA cream.  Onc tx schedule filled out.

## 2013-11-09 NOTE — Telephone Encounter (Signed)
Patient Requested and will pick up 

## 2013-11-10 ENCOUNTER — Telehealth: Payer: Self-pay | Admitting: *Deleted

## 2013-11-10 ENCOUNTER — Ambulatory Visit: Payer: Medicare HMO

## 2013-11-10 NOTE — Telephone Encounter (Signed)
Per staff message and POF I have scheduled appts. Advised scheduler of appts. JMW  

## 2013-11-13 ENCOUNTER — Other Ambulatory Visit: Payer: Self-pay | Admitting: Radiology

## 2013-11-16 ENCOUNTER — Ambulatory Visit (HOSPITAL_COMMUNITY)
Admission: RE | Admit: 2013-11-16 | Discharge: 2013-11-16 | Disposition: A | Discharge status: Home / Self Care | Payer: Medicare HMO | Source: Ambulatory Visit | Attending: Internal Medicine | Admitting: Internal Medicine

## 2013-11-16 ENCOUNTER — Other Ambulatory Visit: Payer: Self-pay | Admitting: Internal Medicine

## 2013-11-16 ENCOUNTER — Encounter (HOSPITAL_COMMUNITY): Payer: Self-pay

## 2013-11-16 DIAGNOSIS — E785 Hyperlipidemia, unspecified: Secondary | ICD-10-CM | POA: Diagnosis not present

## 2013-11-16 DIAGNOSIS — C799 Secondary malignant neoplasm of unspecified site: Secondary | ICD-10-CM | POA: Diagnosis not present

## 2013-11-16 DIAGNOSIS — J449 Chronic obstructive pulmonary disease, unspecified: Secondary | ICD-10-CM | POA: Diagnosis not present

## 2013-11-16 DIAGNOSIS — E119 Type 2 diabetes mellitus without complications: Secondary | ICD-10-CM | POA: Insufficient documentation

## 2013-11-16 DIAGNOSIS — Z7982 Long term (current) use of aspirin: Secondary | ICD-10-CM | POA: Diagnosis not present

## 2013-11-16 DIAGNOSIS — C3491 Malignant neoplasm of unspecified part of right bronchus or lung: Secondary | ICD-10-CM | POA: Insufficient documentation

## 2013-11-16 DIAGNOSIS — C3431 Malignant neoplasm of lower lobe, right bronchus or lung: Secondary | ICD-10-CM

## 2013-11-16 DIAGNOSIS — E039 Hypothyroidism, unspecified: Secondary | ICD-10-CM | POA: Diagnosis not present

## 2013-11-16 DIAGNOSIS — Z87891 Personal history of nicotine dependence: Secondary | ICD-10-CM | POA: Diagnosis not present

## 2013-11-16 LAB — BASIC METABOLIC PANEL
Anion gap: 16 — ABNORMAL HIGH (ref 5–15)
BUN: 12 mg/dL (ref 6–23)
CHLORIDE: 100 meq/L (ref 96–112)
CO2: 23 meq/L (ref 19–32)
Calcium: 10 mg/dL (ref 8.4–10.5)
Creatinine, Ser: 0.73 mg/dL (ref 0.50–1.35)
GFR calc Af Amer: >90 mL/min (ref >90)
GFR calc non Af Amer: >90 mL/min (ref >90)
Glucose, Bld: 101 mg/dL — ABNORMAL HIGH (ref 70–99)
Potassium: 4.1 mEq/L (ref 3.7–5.3)
Sodium: 139 mEq/L (ref 137–147)

## 2013-11-16 LAB — CBC
HEMATOCRIT: 38.4 % — AB (ref 39.0–52.0)
HEMOGLOBIN: 12.1 g/dL — AB (ref 13.0–17.0)
MCH: 26.6 pg (ref 26.0–34.0)
MCHC: 31.5 g/dL (ref 30.0–36.0)
MCV: 84.4 fL (ref 78.0–100.0)
Platelets: 479 10*3/uL — ABNORMAL HIGH (ref 150–400)
RBC: 4.55 MIL/uL (ref 4.22–5.81)
RDW: 15.3 % (ref 11.5–15.5)
WBC: 7.2 10*3/uL (ref 4.0–10.5)

## 2013-11-16 LAB — PROTIME-INR
INR: 1.1 (ref 0.00–1.49)
Prothrombin Time: 14.4 seconds (ref 11.6–15.2)

## 2013-11-16 LAB — APTT: aPTT: 35 seconds (ref 24–37)

## 2013-11-16 MED ORDER — MIDAZOLAM HCL 2 MG/2ML IJ SOLN
INTRAMUSCULAR | Status: AC | PRN
  Administered 2013-11-16: 0.5 mg via INTRAVENOUS
  Administered 2013-11-16 (×4): 1 mg via INTRAVENOUS

## 2013-11-16 MED ORDER — HEPARIN SOD (PORK) LOCK FLUSH 100 UNIT/ML IV SOLN
INTRAVENOUS | Status: AC | PRN
  Administered 2013-11-16: 500 [IU]

## 2013-11-16 MED ORDER — MIDAZOLAM HCL 2 MG/2ML IJ SOLN
INTRAMUSCULAR | Status: AC
  Filled 2013-11-16: qty 6

## 2013-11-16 MED ORDER — FENTANYL CITRATE 0.05 MG/ML IJ SOLN
INTRAMUSCULAR | Status: AC
  Filled 2013-11-16: qty 2

## 2013-11-16 MED ORDER — LIDOCAINE-EPINEPHRINE 2 %-1:100000 IJ SOLN
INTRAMUSCULAR | Status: AC
  Filled 2013-11-16: qty 1

## 2013-11-16 MED ORDER — LIDOCAINE HCL 1 % IJ SOLN
INTRAMUSCULAR | Status: AC
  Filled 2013-11-16: qty 20

## 2013-11-16 MED ORDER — CEFAZOLIN SODIUM-DEXTROSE 2-3 GM-% IV SOLR
2.0000 g | Freq: Once | INTRAVENOUS | Status: AC
  Administered 2013-11-16: 2 g via INTRAVENOUS

## 2013-11-16 MED ORDER — FENTANYL CITRATE 0.05 MG/ML IJ SOLN
INTRAMUSCULAR | Status: AC | PRN
  Administered 2013-11-16: 50 ug via INTRAVENOUS
  Administered 2013-11-16: 25 ug via INTRAVENOUS
  Administered 2013-11-16: 50 ug via INTRAVENOUS

## 2013-11-16 MED ORDER — SODIUM CHLORIDE 0.9 % IV SOLN
Freq: Once | INTRAVENOUS | Status: AC
  Administered 2013-11-16: 08:00:00 via INTRAVENOUS

## 2013-11-16 MED ORDER — CEFAZOLIN SODIUM-DEXTROSE 2-3 GM-% IV SOLR
INTRAVENOUS | Status: AC
Start: 2013-11-16 — End: 2013-11-16
  Administered 2013-11-16: 2 g via INTRAVENOUS
  Filled 2013-11-16: qty 50

## 2013-11-16 MED ORDER — HEPARIN SOD (PORK) LOCK FLUSH 100 UNIT/ML IV SOLN
INTRAVENOUS | Status: AC
  Filled 2013-11-16: qty 5

## 2013-11-16 NOTE — H&P (Signed)
Chief Complaint: "I'm getting a port a cath"  Referring Physician(s): Mohamed,Mohamed  History of Present Illness: Steve [name] is a 73 y.o. 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   Past Medical History  Diagnosis Date  . Dizziness and giddiness     positional vertigo  . Hyperlipidemia   . Impotence of organic origin   . Osteoarthritis, shoulder   . Allergy   . Hypothyroidism     pt denies thryoid disease, dr pandey note 11-19-12 says subclinical hypothryoid in epic  . COPD (chronic obstructive pulmonary disease)   . Insomnia   . Malignant neoplasm of bladder, part unspecified   . Malignant neoplasm of bronchus and lung, unspecified site   . Diabetes mellitus without complication     pt denies diabetes, dr pandey note says dm 11-19-12 epic  . Hx of radiation therapy 02/2013    right lung    Past Surgical History  Procedure Laterality Date  . Cystoscopy w/ dilation of bladder    . Tonsillectomy  age 89  . Endobronchial ultrasound Bilateral 12/15/2012    Procedure: ENDOBRONCHIAL ULTRASOUND;  Surgeon: Brand Males, MD;  Location: [facility];  Service: Cardiopulmonary;  Laterality: Bilateral;  . Colonoscopy  2009    [facility]    Allergies: Morphine and related  Medications: Prior to Admission medications   Medication Sig Start Date End Date Taking? Authorizing Provider  aspirin 81 MG tablet Take 81 mg by mouth daily.   Yes Historical Provider, MD  DULoxetine (CYMBALTA) 60 MG capsule Take 1 capsule (60 mg total) by mouth daily. Patient taking differently: Take 60 mg by mouth every morning.  10/27/13  Yes Curt Bears, MD  gabapentin (NEURONTIN) 300 MG capsule TAKE 1 CAPSULE BY MOUTH THREE TIMES DAILY 09/09/13  Yes Mahima Bubba Camp, MD  oxyCODONE-acetaminophen (ROXICET) 5-325 MG per tablet Take 1 tablet by mouth every 6 (six) hours as needed for severe pain.  11/09/13  Yes Tiffany L Reed, DO  simvastatin (ZOCOR) 20 MG tablet Take one tablet by mouth once daily to lower cholesterol 10/21/13  Yes [name], MD  zolpidem (AMBIEN) 10 MG tablet TAKE 1 TABLET BY MOUTH EVERY NIGHT AT BEDTIME FOR REST 09/09/13  Yes Mahima Pandey, MD  gabapentin (NEURONTIN) 300 MG capsule Take 300 mg by mouth 2 (two) times daily.    Historical Provider, MD  lidocaine-prilocaine (EMLA) cream Apply 1 application topically as needed. Apply to port 1 hour before infusion appt, cover with dressing. 11/09/13   Curt Bears, MD  zolpidem (AMBIEN) 10 MG tablet Take 10 mg by mouth at bedtime as needed for sleep.    Historical Provider, MD    Family History  Problem Relation Age of Onset  . Kidney disease Brother     History   Social History  . Marital Status: Married    Spouse Name: N/A    Number of Children: N/A  . Years of Education: N/A   Occupational History  . business owner    Social History Main Topics  . Smoking status: Former Smoker -- 1.50 packs/day for 50 years    Types: Cigarettes    Quit date: 01/09/2007  . Smokeless tobacco: Current User    Types: Chew  . Alcohol Use: Yes     Comment: 4-5 beers a month, 1 mixed drink every 2 months  . Drug Use: No  . Sexual Activity:  None   Other Topics Concern  . None   Social History Narrative         Review of Systems  Constitutional: Negative for fever and chills.  Respiratory: Positive for cough and shortness of breath.   Cardiovascular: Negative for chest pain.  Gastrointestinal: Negative for nausea, vomiting, abdominal pain and blood in stool.  Genitourinary: Negative for dysuria and hematuria.  Musculoskeletal: Negative for back pain.  Neurological: Negative for headaches.       LE neuropathy; occ dizziness  Hematological: Does not bruise/bleed easily.    Vital Signs: BP 120/81 mmHg  Pulse 108  Temp(Src) 97.7 F (36.5 C) (Oral)  Resp 18  SpO2 99%  Physical Exam  Constitutional: He  is oriented to person, place, and time. He appears well-developed and well-nourished.  Cardiovascular: Regular rhythm.   Sl tachy  Pulmonary/Chest: Effort normal.  Dim BS rt base, left clear  Abdominal: Soft. Bowel sounds are normal. There is no tenderness.  Musculoskeletal: Normal range of motion. He exhibits no edema.  Neurological: He is alert and oriented to person, place, and time.    Imaging: Dg Chest 1 View  10/28/2013   CLINICAL DATA:  Status post right thoracentesis.  EXAM: CHEST - 1 VIEW  COMPARISON:  10/20/2013  FINDINGS: Cardiac shadow is stable. There is volume loss on the right related to the known central mass lesion and collapse of the right lower lobe. A small pleural effusion is noted although significantly improved from the prior CT examination. No post thoracentesis pneumothorax is noted. The left lung remains clear. No acute bony abnormality is noted. Old rib fractures are seen on the right.  IMPRESSION: No evidence of pneumothorax following right thoracentesis.   Electronically Signed   By: Inez Catalina M.D.   On: 10/28/2013 13:48   Ct Chest W Contrast  11/02/2013   CLINICAL DATA:  Followup right lower lobe lung carcinoma. Completed chemotherapy and radiation therapy. Right pleural effusion and pulmonary opacity on chest radiograph.  EXAM: CT CHEST WITH CONTRAST  TECHNIQUE: Multidetector CT imaging of the chest was performed during intravenous contrast administration.  CONTRAST:  65mL OMNIPAQUE IOHEXOL 300 MG/ML  SOLN  COMPARISON:  10/20/2013  FINDINGS: Mediastinum/Hilar Regions: Mild lymphadenopathy again seen in the subcarinal region, measuring 1.6 x 2.3 cm on image 32 compared to 1.8 x 2.2 cm previously. Small less than 10 mm right paratracheal lymph nodes remain stable. No new or increased lymphadenopathy identified.  Other Thoracic Lymphadenopathy:  None.  Lungs: Masslike opacity in the medial right lower lobe abutting the mediastinum is difficult to measure due to  adjacent right lower lobe consolidation and volume loss. However, this measures approximately 3.9 x 4.5 cm on image 39 compared to 4.1 x 4.6 cm previously, without significant change. Right lung paramediastinal radiation changes and right lower lobe consolidation and volume loss remain stable. Probable periosteal radiation change also seen left upper lobe.  Pleura: Moderate right pleural effusion shows slight decrease in size.  Vascular/Cardiac:  No acute findings identified.  Other:  None.  Musculoskeletal:  No suspicious bone lesions identified.  IMPRESSION: No significant change in masslike lesion in the medial right lower lobe, which is difficult to visualize due to adjacent right lower lobe volume loss and consolidation.  Stable mild mediastinal lymphadenopathy.  Slight decrease in size of right pleural effusion.  No definite new or progressive disease visualized within the thorax.   Electronically Signed   By: Earle Gell M.D.   On: 11/02/2013 15:12  Ct Angio Chest Pe W/cm &/or Wo Cm  10/20/2013   CLINICAL DATA:  Acute onset shortness of breath. Known lung carcinoma  EXAM: CT ANGIOGRAPHY CHEST WITH CONTRAST  TECHNIQUE: Multidetector CT imaging of the chest was performed using the standard protocol during bolus administration of intravenous contrast. Multiplanar CT image reconstructions and MIPs were obtained to evaluate the vascular anatomy.  CONTRAST:  180mL OMNIPAQUE IOHEXOL 350 MG/ML SOLN  COMPARISON:  PET-CT September 02, 2013  FINDINGS: There is no demonstrable pulmonary embolus. There is atherosclerotic change in the aorta but no aneurysm or dissection.  There is a sizable right pleural effusion with extensive right lower lobe consolidation. There is obstruction of the right lower lobe bronchus from a known mass which currently is surrounded and obscured by consolidation. There is also patchy infiltrate in the posterior segment of the right upper lobe and medial segment of the right middle lobe.  There  is mild scarring in the medial aspect of the left upper lobe anteriorly. Left lung is otherwise clear.  There is again noted sub- carinal adenopathy with an enlarged lymph node measuring 2.2 x 1.8 cm in this region. There are small mediastinal lymph nodes elsewhere which do not meet size criteria for pathologic significance.  Pericardium is not appreciably thickened.  In the visualized upper abdomen there is hepatic steatosis. There is degenerative change in the thoracic spine. There are no blastic or lytic bone lesions. Visualized thyroid appears normal.  Review of the MIP images confirms the above findings.  IMPRESSION: No demonstrable pulmonary embolus.  Extensive right lower lobe consolidation with obstruction of the right lower lobe bronchus. The known mass in the right lower lobe is obscured by the consolidation. There is patchy infiltrate elsewhere on the right, primarily in the medial segment right middle and posterior segment right upper lobe regions. There is mild scarring on the left medially in the upper lobe region. Left lung is otherwise clear. There is a sizable effusion on the right.  There is sub- carinal adenopathy, documented previously.  There is hepatic steatosis.   Electronically Signed   By: Lowella Grip M.D.   On: 10/20/2013 16:21   US Thoracentesis Asp Pleural Space W/img Guide  10/28/2013   CLINICAL DATA:  History of lung cancer, shortness of breath. Large right pleural effusion. Request therapeutic thoracentesis.  EXAM: ULTRASOUND GUIDED RIGHT THORACENTESIS  COMPARISON:  None.  PROCEDURE: An ultrasound guided thoracentesis was thoroughly discussed with the patient and questions answered. The benefits, risks, alternatives and complications were also discussed. The patient understands and wishes to proceed with the procedure. Written consent was obtained.  Ultrasound was performed to localize and mark an adequate pocket of fluid in the right chest. The area was then prepped and  draped in the normal sterile fashion. 1% Lidocaine was used for local anesthesia. Under ultrasound guidance a 19 gauge Yueh catheter was introduced. Thoracentesis was performed. The catheter was removed and a dressing applied.  COMPLICATIONS: None  FINDINGS: A total of approximately 1.5 L of clear yellow fluid was removed. A fluid sample was notsent for laboratory analysis.  IMPRESSION: Successful ultrasound guided right thoracentesis yielding 1.5 L of pleural fluid.  Read by: Ascencion Dike PA-C   Electronically Signed   By: Markus Daft M.D.   On: 10/28/2013 14:20    Labs:  CBC:  Recent Labs  10/20/13 1324 10/27/13 1441 11/03/13 0917 11/16/13 0755  WBC 8.9 8.0 9.2 7.2  HGB 11.4* 11.0* 11.6* 12.1*  HCT 35.7* 35.0*  36.5* 38.4*  PLT 670* 686* 705* 479*    COAGS: No results for input(s): INR, APTT in the last 8760 hours.  BMP:  Recent Labs  11/17/12 0805  04/28/13 0819  10/15/13 0957 10/20/13 1324 10/27/13 1442 11/03/13 0918  NA 143  < > 142  < > 141 141 141 141  K 4.8  < > 5.1  < > 4.2 4.0 4.0 3.9  CL 103  --  103  --   --   --   --   --   CO2 26  < > 24  < > 28 25 23 24   GLUCOSE 104*  < > 103*  < > 189* 125 108 149*  BUN 13  < > 12  < > 15.1 17.8 16.6 16.5  CALCIUM 10.0  < > 9.9  < > 10.0 10.0 9.5 9.7  CREATININE 0.95  < > 0.87  < > 0.9 0.9 0.8 0.8  GFRNONAA 80  --  86  --   --   --   --   --   GFRAA 93  --  100  --   --   --   --   --   < > = values in this interval not displayed.  LIVER FUNCTION TESTS:  Recent Labs  10/15/13 0957 10/20/13 1324 10/27/13 1442 11/03/13 0918  BILITOT 0.30 0.27 0.21 0.32  AST 14 23 17 17   ALT 20 34 25 25  ALKPHOS 75 80 72 74  PROT 7.4 7.8 7.3 7.2  ALBUMIN 2.8* 2.9* 2.8* 2.8*    TUMOR MARKERS: No results for input(s): AFPTM, CEA, CA199, CHROMGRNA in the last 8760 hours.  Assessment and Plan: SHARROD ACHILLE is a 73 y.o. 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      SignedAutumn Messing 11/16/2013, 8:54 AM   I saw and evaluated Mr. Coppernoll and agree with the PA note above.  Proceed as planned.  Signed,  Criselda Peaches, MD

## 2013-11-16 NOTE — Procedures (Signed)
Interventional Radiology Procedure Note  Procedure: Placement of a right IJ approach single lumen PowerPort.  Tip is positioned at the superior cavoatrial junction and catheter is ready for immediate use.  Complications: No immediate Recommendations:  - Ok to shower tomorrow - Do not submerge for 7 days - Routine line care   Signed,  Karrington Mccravy K. Vonya Ohalloran, MD   

## 2013-11-16 NOTE — Discharge Instructions (Signed)
Implanted Port Insertion, Care After °Refer to this sheet in the next few weeks. These instructions provide you with information on caring for yourself after your procedure. Your health care provider may also give you more specific instructions. Your treatment has been planned according to current medical practices, but problems sometimes occur. Call your health care provider if you have any problems or questions after your procedure. °WHAT TO EXPECT AFTER THE PROCEDURE °After your procedure, it is typical to have the following:  °· Discomfort at the port insertion site. Ice packs to the area will help. °· Bruising on the skin over the port. This will subside in 3-4 days. °HOME CARE INSTRUCTIONS °· After your port is placed, you will get a manufacturer's information card. The card has information about your port. Keep this card with you at all times.   °· Know what kind of port you have. There are many types of ports available.   °· Wear a medical alert bracelet in case of an emergency. This can help alert health care workers that you have a port.   °· The port can stay in for as long as your health care provider believes it is necessary.   °· A home health care nurse may give medicines and take care of the port.   °· You or a family member can get special training and directions for giving medicine and taking care of the port at home.   °SEEK MEDICAL CARE IF:  °· Your port does not flush or you are unable to get a blood return.   °· You have a fever or chills. °SEEK IMMEDIATE MEDICAL CARE IF: °· You have new fluid or pus coming from your incision.   °· You notice a bad smell coming from your incision site.   °· You have swelling, pain, or more redness at the incision or port site.   °· You have chest pain or shortness of breath. °Document Released: 10/15/2012 Document Revised: 12/30/2012 Document Reviewed: 10/15/2012 °ExitCare® Patient Information ©2015 [employer]. This information is not intended to replace  advice given to you by your health care provider. Make sure you discuss any questions you have with your health care provider. °Implanted Port Home Guide °An implanted port is a type of central line that is placed under the skin. Central lines are used to provide IV access when treatment or nutrition needs to be given through a person's veins. Implanted ports are used for long-term IV access. An implanted port may be placed because:  °· You need IV medicine that would be irritating to the small veins in your hands or arms.   °· You need long-term IV medicines, such as antibiotics.   °· You need IV nutrition for a long period.   °· You need frequent blood draws for lab tests.   °· You need dialysis.   °Implanted ports are usually placed in the chest area, but they can also be placed in the upper arm, the abdomen, or the leg. An implanted port has two main parts:  °· Reservoir. The reservoir is round and will appear as a small, raised area under your skin. The reservoir is the part where a needle is inserted to give medicines or draw blood.   °· Catheter. The catheter is a thin, flexible tube that extends from the reservoir. The catheter is placed into a large vein. Medicine that is inserted into the reservoir goes into the catheter and then into the vein.   °HOW WILL I CARE FOR MY INCISION SITE? °Do not get the incision site wet. Bathe or   shower as directed by your health care provider.  °HOW IS MY PORT ACCESSED? °Special steps must be taken to access the port:  °· Before the port is accessed, a numbing cream can be placed on the skin. This helps numb the skin over the port site.   °· Your health care provider uses a sterile technique to access the port. °· Your health care provider must put on a mask and sterile gloves. °· The skin over your port is cleaned carefully with an antiseptic and allowed to dry. °· The port is gently pinched between sterile gloves, and a needle is inserted into the port. °· Only  "non-coring" port needles should be used to access the port. Once the port is accessed, a blood return should be checked. This helps ensure that the port is in the vein and is not clogged.   °· If your port needs to remain accessed for a constant infusion, a clear (transparent) bandage will be placed over the needle site. The bandage and needle will need to be changed every week, or as directed by your health care provider.   °· Keep the bandage covering the needle clean and dry. Do not get it wet. Follow your health care provider's instructions on how to take a shower or bath while the port is accessed.   °· If your port does not need to stay accessed, no bandage is needed over the port.   °WHAT IS FLUSHING? °Flushing helps keep the port from getting clogged. Follow your health care provider's instructions on how and when to flush the port. Ports are usually flushed with saline solution or a medicine called heparin. The need for flushing will depend on how the port is used.  °· If the port is used for intermittent medicines or blood draws, the port will need to be flushed:   °· After medicines have been given.   °· After blood has been drawn.   °· As part of routine maintenance.   °· If a constant infusion is running, the port may not need to be flushed.   °HOW LONG WILL MY PORT STAY IMPLANTED? °The port can stay in for as long as your health care provider thinks it is needed. When it is time for the port to come out, surgery will be done to remove it. The procedure is similar to the one performed when the port was put in.  °WHEN SHOULD I SEEK IMMEDIATE MEDICAL CARE? °When you have an implanted port, you should seek immediate medical care if:  °· You notice a bad smell coming from the incision site.   °· You have swelling, redness, or drainage at the incision site.   °· You have more swelling or pain at the port site or the surrounding area.   °· You have a fever that is not controlled with medicine. °Document  Released: 12/25/2004 Document Revised: 10/15/2012 Document Reviewed: 09/01/2012 °ExitCare® Patient Information ©2015 [employer]. This information is not intended to replace advice given to you by your health care provider. Make sure you discuss any questions you have with your health care provider.Conscious Sedation, Adult, Care After °Refer to this sheet in the next few weeks. These instructions provide you with information on caring for yourself after your procedure. Your health care provider may also give you more specific instructions. Your treatment has been planned according to current medical practices, but problems sometimes occur. Call your health care provider if you have any problems or questions after your procedure. °WHAT TO EXPECT AFTER THE PROCEDURE  °After your procedure: °·   You may feel sleepy, clumsy, and have poor balance for several hours. °· Vomiting may occur if you eat too soon after the procedure. °HOME CARE INSTRUCTIONS °· Do not participate in any activities where you could become injured for at least 24 hours. Do not: °¨ Drive. °¨ Swim. °¨ Ride a bicycle. °¨ Operate heavy machinery. °¨ Cook. °¨ Use power tools. °¨ Climb ladders. °¨ Work from a high place. °· Do not make important decisions or sign legal documents until you are improved. °· If you vomit, drink water, juice, or soup when you can drink without vomiting. Make sure you have little or no nausea before eating solid foods. °· Only take over-the-counter or prescription medicines for pain, discomfort, or fever as directed by your health care provider. °· Make sure you and your family fully understand everything about the medicines given to you, including what side effects may occur. °· You should not drink alcohol, take sleeping pills, or take medicines that cause drowsiness for at least 24 hours. °· If you smoke, do not smoke without supervision. °· If you are feeling better, you may resume normal activities 24 hours after you  were sedated. °· Keep all appointments with your health care provider. °SEEK MEDICAL CARE IF: °· Your skin is pale or bluish in color. °· You continue to feel nauseous or vomit. °· Your pain is getting worse and is not helped by medicine. °· You have bleeding or swelling. °· You are still sleepy or feeling clumsy after 24 hours. °SEEK IMMEDIATE MEDICAL CARE IF: °· You develop a rash. °· You have difficulty breathing. °· You develop any type of allergic problem. °· You have a fever. °MAKE SURE YOU: °· Understand these instructions. °· Will watch your condition. °· Will get help right away if you are not doing well or get worse. °Document Released: 10/15/2012 Document Reviewed: 10/15/2012 °ExitCare® Patient Information ©2015 [employer]. This information is not intended to replace advice given to you by your health care provider. Make sure you discuss any questions you have with your health care provider. ° °

## 2013-11-17 ENCOUNTER — Ambulatory Visit (HOSPITAL_BASED_OUTPATIENT_CLINIC_OR_DEPARTMENT_OTHER): Payer: Medicare HMO | Admitting: Physician Assistant

## 2013-11-17 ENCOUNTER — Ambulatory Visit: Payer: Medicare HMO

## 2013-11-17 ENCOUNTER — Ambulatory Visit (HOSPITAL_BASED_OUTPATIENT_CLINIC_OR_DEPARTMENT_OTHER): Payer: Medicare HMO

## 2013-11-17 ENCOUNTER — Other Ambulatory Visit (HOSPITAL_BASED_OUTPATIENT_CLINIC_OR_DEPARTMENT_OTHER): Payer: Medicare HMO

## 2013-11-17 VITALS — HR 117 | Temp 97.6°F | Resp 18 | Ht 73.0 in | Wt 184.0 lb

## 2013-11-17 DIAGNOSIS — Z5112 Encounter for antineoplastic immunotherapy: Secondary | ICD-10-CM

## 2013-11-17 DIAGNOSIS — C3431 Malignant neoplasm of lower lobe, right bronchus or lung: Secondary | ICD-10-CM

## 2013-11-17 DIAGNOSIS — G609 Hereditary and idiopathic neuropathy, unspecified: Secondary | ICD-10-CM

## 2013-11-17 DIAGNOSIS — C3491 Malignant neoplasm of unspecified part of right bronchus or lung: Secondary | ICD-10-CM

## 2013-11-17 LAB — CBC WITH DIFFERENTIAL/PLATELET
BASO%: 0.5 % (ref 0.0–2.0)
Basophils Absolute: 0 10*3/uL (ref 0.0–0.1)
EOS ABS: 0.1 10*3/uL (ref 0.0–0.5)
EOS%: 1.4 % (ref 0.0–7.0)
HCT: 39.7 % (ref 38.4–49.9)
HGB: 12.6 g/dL — ABNORMAL LOW (ref 13.0–17.1)
LYMPH%: 14.1 % (ref 14.0–49.0)
MCH: 26.4 pg — AB (ref 27.2–33.4)
MCHC: 31.7 g/dL — ABNORMAL LOW (ref 32.0–36.0)
MCV: 83.5 fL (ref 79.3–98.0)
MONO#: 0.5 10*3/uL (ref 0.1–0.9)
MONO%: 6.2 % (ref 0.0–14.0)
NEUT#: 6.3 10*3/uL (ref 1.5–6.5)
NEUT%: 77.8 % — AB (ref 39.0–75.0)
Platelets: 551 10*3/uL — ABNORMAL HIGH (ref 140–400)
RBC: 4.76 10*6/uL (ref 4.20–5.82)
RDW: 16.7 % — AB (ref 11.0–14.6)
WBC: 8.1 10*3/uL (ref 4.0–10.3)
lymph#: 1.1 10*3/uL (ref 0.9–3.3)

## 2013-11-17 LAB — COMPREHENSIVE METABOLIC PANEL (CC13)
ALBUMIN: 3.5 g/dL (ref 3.5–5.0)
ALT: 30 U/L (ref 0–55)
AST: 28 U/L (ref 5–34)
Alkaline Phosphatase: 92 U/L (ref 40–150)
Anion Gap: 10 mEq/L (ref 3–11)
BILIRUBIN TOTAL: 0.22 mg/dL (ref 0.20–1.20)
BUN: 15.4 mg/dL (ref 7.0–26.0)
CO2: 26 mEq/L (ref 22–29)
CREATININE: 0.8 mg/dL (ref 0.7–1.3)
Calcium: 10.2 mg/dL (ref 8.4–10.4)
Chloride: 105 mEq/L (ref 98–109)
GLUCOSE: 108 mg/dL (ref 70–140)
Potassium: 4.1 mEq/L (ref 3.5–5.1)
Sodium: 141 mEq/L (ref 136–145)
Total Protein: 8 g/dL (ref 6.4–8.3)

## 2013-11-17 MED ORDER — SODIUM CHLORIDE 0.9 % IJ SOLN
10.0000 mL | INTRAMUSCULAR | Status: DC | PRN
  Administered 2013-11-17: 10 mL
  Filled 2013-11-17: qty 10

## 2013-11-17 MED ORDER — HEPARIN SOD (PORK) LOCK FLUSH 100 UNIT/ML IV SOLN
500.0000 [IU] | Freq: Once | INTRAVENOUS | Status: AC | PRN
  Administered 2013-11-17: 500 [IU]
  Filled 2013-11-17: qty 5

## 2013-11-17 MED ORDER — SODIUM CHLORIDE 0.9 % IV SOLN
3.0000 mg/kg | Freq: Once | INTRAVENOUS | Status: AC
  Administered 2013-11-17: 260 mg via INTRAVENOUS
  Filled 2013-11-17: qty 26

## 2013-11-17 MED ORDER — SODIUM CHLORIDE 0.9 % IV SOLN
Freq: Once | INTRAVENOUS | Status: AC
  Administered 2013-11-17: 16:00:00 via INTRAVENOUS

## 2013-11-17 NOTE — Patient Instructions (Signed)
[facility] Discharge Instructions for Patients Receiving Chemotherapy  Today you received the following chemotherapy agents: Nivolumab.  To help prevent nausea and vomiting after your treatment, we encourage you to take your nausea medication:  As directed.   If you develop nausea and vomiting that is not controlled by your nausea medication, call the clinic.   BELOW ARE SYMPTOMS THAT SHOULD BE REPORTED IMMEDIATELY:  *FEVER GREATER THAN 100.5 F  *CHILLS WITH OR WITHOUT FEVER  NAUSEA AND VOMITING THAT IS NOT CONTROLLED WITH YOUR NAUSEA MEDICATION  *UNUSUAL SHORTNESS OF BREATH  *UNUSUAL BRUISING OR BLEEDING  TENDERNESS IN MOUTH AND THROAT WITH OR WITHOUT PRESENCE OF ULCERS  *URINARY PROBLEMS  *BOWEL PROBLEMS  UNUSUAL RASH Items with * indicate a potential emergency and should be followed up as soon as possible.  Feel free to call the clinic you have any questions or concerns. The clinic phone number is [phone].

## 2013-11-18 ENCOUNTER — Telehealth: Payer: Self-pay | Admitting: Internal Medicine

## 2013-11-18 NOTE — Telephone Encounter (Signed)
s.w. pt and advised on NOV appt....pt ok and aware °

## 2013-11-19 ENCOUNTER — Other Ambulatory Visit: Payer: Self-pay | Admitting: *Deleted

## 2013-11-19 MED ORDER — ZOLPIDEM TARTRATE 10 MG PO TABS: ORAL_TABLET | ORAL | Status: DC

## 2013-11-19 NOTE — Addendum Note (Signed)
Addended by: Rafael Bihari A on: 11/19/2013 11:26 AM   Modules accepted: Orders

## 2013-11-19 NOTE — Telephone Encounter (Signed)
Patient called and requested. Called in Rx into pharmacy and spoke with pharmacist.

## 2013-11-20 NOTE — Patient Instructions (Signed)
Follow-up in 2 weeks with a restaging CT scan of your chest to reevaluate your disease

## 2013-11-20 NOTE — Progress Notes (Addendum)
[facility] [phone]   Fax:(336) [address], [place], [place] [address]  DIAGNOSIS: Metastatic non-small cell lung cancer initially diagnosed as Stage IIIA (T2 B., N2, M0) non-small cell lung cancer consistent with poorly differentiated squamous cell carcinoma diagnosed in December of 2014.   Primary site: Lung (Right)  Staging method: AJCC 7th Edition  Clinical: Stage IIIA (T2b, N2, M0)  Summary: Stage IIIA (T2b, N2, M0)   PRIOR THERAPY:  1) Concurrent chemoradiation with weekly carboplatin for an AUC of 2 and paclitaxel 45 mg/m2, status post 4 cycles. 2) Consolidation chemotherapy with carboplatin for AUC of 5 and paclitaxel 175 mg/M2 every 3 weeks with Neulasta support, first cycle on 03/23/2013. Status post 3 cycles.  CURRENT THERAPY: Immunotherapy with Nivolumab 3 mg/KG every 2 weeks. First dose 09/21/2013. Status post 3 cycles.  DISEASE STAGE:  Lung cancer  Primary site: Lung (Right)  Staging method: AJCC 7th Edition  Clinical: Stage IIIA (T2b, N2, M0)  Summary: Stage IIIA (T2b, N2, M0)  CHEMOTHERAPY INTENT: Control/Palliative  CURRENT # OF CHEMOTHERAPY CYCLES:4 CURRENT ANTIEMETICS: Zofran, dexamethasone, Compazine  CURRENT SMOKING STATUS: Former smoker, quit 01/09/2007  ORAL CHEMOTHERAPY AND CONSENT: n/a  CURRENT BISPHOSPHONATES USE: none  PAIN MANAGEMENT: Ibuprofen  NARCOTICS INDUCED CONSTIPATION: None  LIVING WILL AND CODE STATUS: ?  INTERVAL HISTORY: Steve [name] 73 y.o. male returns to the clinic today for followup visit accompanied by his wife. He started treatment with Nivolumab. He is status post for 3 cycles and tolerated it fairly well. He continues to complain of  increasing shortness of breath and fatigue as well as dizzy spells. He had CT angiogram of the chest performed 10/20/2013  which showed collapse of the right lower lobe in addition to right pleural effusion. He  also has peripheral neuropathy and he takes Neurontin 300 mg by mouth in the morning and 300 mg in the evening. He reports that his oxycodone, 1-3 tablets daily actually helps him with his neuropathy as well. He denied having any significant chest pain but continues to have shortness of breath with exertion with mild cough without hemoptysis. He denied having any weight loss or night sweats. The patient has no fever or chills and no nausea or vomiting. He reports some dizziness. He does report a history of vertigo over the past year. He denies any falls.He presents to proceed with cycle #4 of his immunotherapy with Nivolumab  MEDICAL HISTORY: Past Medical History  Diagnosis Date  . Dizziness and giddiness     positional vertigo  . Hyperlipidemia   . Impotence of organic origin   . Osteoarthritis, shoulder   . Allergy   . Hypothyroidism     pt denies thryoid disease, dr pandey note 11-19-12 says subclinical hypothryoid in epic  . COPD (chronic obstructive pulmonary disease)   . Insomnia   . Malignant neoplasm of bladder, part unspecified   . Malignant neoplasm of bronchus and lung, unspecified site   . Diabetes mellitus without complication     pt denies diabetes, dr pandey note says dm 11-19-12 epic  . Hx of radiation therapy 02/2013    right lung    ALLERGIES:  is allergic to morphine and related.  MEDICATIONS:  Current Outpatient Prescriptions  Medication Sig Dispense Refill  . aspirin 81 MG tablet Take 81 mg by mouth daily.    . DULoxetine (CYMBALTA) 60 MG capsule Take 1 capsule (60 mg total)  by mouth daily. (Patient taking differently: Take 60 mg by mouth every morning. ) 30 capsule 3  . gabapentin (NEURONTIN) 300 MG capsule TAKE 1 CAPSULE BY MOUTH THREE TIMES DAILY 270 capsule 0  . lidocaine-prilocaine (EMLA) cream Apply 1 application topically as needed. Apply to port 1 hour before infusion appt, cover with dressing. 30 g 0  . oxyCODONE-acetaminophen (ROXICET) 5-325 MG per tablet  Take 1 tablet by mouth every 6 (six) hours as needed for severe pain. 120 tablet 0  . simvastatin (ZOCOR) 20 MG tablet Take one tablet by mouth once daily to lower cholesterol 90 tablet 3  . zolpidem (AMBIEN) 10 MG tablet Take one tablet by mouth once daily at bedtime for rest 90 tablet 0   No current facility-administered medications for this visit.    SURGICAL HISTORY:  Past Surgical History  Procedure Laterality Date  . Cystoscopy w/ dilation of bladder    . Tonsillectomy  age 28  . Endobronchial ultrasound Bilateral 12/15/2012    Procedure: ENDOBRONCHIAL ULTRASOUND;  Surgeon: Brand Males, MD;  Location: [facility];  Service: Cardiopulmonary;  Laterality: Bilateral;  . Colonoscopy  2009    [facility]    REVIEW OF SYSTEMS:  Constitutional: negative Eyes: negative Ears, nose, mouth, throat, and face: negative Respiratory: positive for cough and dyspnea on exertion Cardiovascular: negative Gastrointestinal: negative Genitourinary:negative Integument/breast: negative Hematologic/lymphatic: negative Musculoskeletal:negative Neurological: positive for dizziness and paresthesia Behavioral/Psych: negative Endocrine: negative Allergic/Immunologic: negative   PHYSICAL EXAMINATION: General appearance: alert, cooperative and no distress Head: Normocephalic, without obvious abnormality, atraumatic Neck: no adenopathy, no JVD, supple, symmetrical, trachea midline and thyroid not enlarged, symmetric, no tenderness/mass/nodules Lymph nodes: Cervical, supraclavicular, and axillary nodes normal. Resp: clear to auscultation bilaterally Back: symmetric, no curvature. ROM normal. No CVA tenderness. Cardio: regular rate and rhythm, S1, S2 normal, no murmur, click, rub or gallop GI: soft, non-tender; bowel sounds normal; no masses,  no organomegaly Extremities: extremities normal, atraumatic, no cyanosis or edema Neurologic: Alert and oriented X 3, normal strength and tone. Normal  symmetric reflexes. Normal coordination and gait  ECOG PERFORMANCE STATUS: 1 - Symptomatic but completely ambulatory  Pulse 117, temperature 97.6 F (36.4 C), temperature source Oral, resp. rate 18, height 6\' 1"  (1.854 m), weight 184 lb (83.462 kg), SpO2 98 %.  LABORATORY DATA: Lab Results  Component Value Date   WBC 8.1 11/17/2013   HGB 12.6* 11/17/2013   HCT 39.7 11/17/2013   MCV 83.5 11/17/2013   PLT 551* 11/17/2013      Chemistry      Component Value Date/Time   NA 141 11/17/2013 1339   NA 139 11/16/2013 0755   NA 142 04/28/2013 0819   K 4.1 11/17/2013 1339   K 4.1 11/16/2013 0755   CL 100 11/16/2013 0755   CO2 26 11/17/2013 1339   CO2 23 11/16/2013 0755   BUN 15.4 11/17/2013 1339   BUN 12 11/16/2013 0755   BUN 12 04/28/2013 0819   CREATININE 0.8 11/17/2013 1339   CREATININE 0.73 11/16/2013 0755      Component Value Date/Time   CALCIUM 10.2 11/17/2013 1339   CALCIUM 10.0 11/16/2013 0755   ALKPHOS 92 11/17/2013 1339   ALKPHOS 107 04/28/2013 0819   AST 28 11/17/2013 1339   AST 19 04/28/2013 0819   ALT 30 11/17/2013 1339   ALT 12 04/28/2013 0819   BILITOT 0.22 11/17/2013 1339   BILITOT 0.4 04/28/2013 0819       RADIOGRAPHIC STUDIES: Ct Angio Chest Pe W/cm &/or  Wo Cm  10/20/2013   CLINICAL DATA:  Acute onset shortness of breath. Known lung carcinoma  EXAM: CT ANGIOGRAPHY CHEST WITH CONTRAST  TECHNIQUE: Multidetector CT imaging of the chest was performed using the standard protocol during bolus administration of intravenous contrast. Multiplanar CT image reconstructions and MIPs were obtained to evaluate the vascular anatomy.  CONTRAST:  110mL OMNIPAQUE IOHEXOL 350 MG/ML SOLN  COMPARISON:  PET-CT September 02, 2013  FINDINGS: There is no demonstrable pulmonary embolus. There is atherosclerotic change in the aorta but no aneurysm or dissection.  There is a sizable right pleural effusion with extensive right lower lobe consolidation. There is obstruction of the right  lower lobe bronchus from a known mass which currently is surrounded and obscured by consolidation. There is also patchy infiltrate in the posterior segment of the right upper lobe and medial segment of the right middle lobe.  There is mild scarring in the medial aspect of the left upper lobe anteriorly. Left lung is otherwise clear.  There is again noted sub- carinal adenopathy with an enlarged lymph node measuring 2.2 x 1.8 cm in this region. There are small mediastinal lymph nodes elsewhere which do not meet size criteria for pathologic significance.  Pericardium is not appreciably thickened.  In the visualized upper abdomen there is hepatic steatosis. There is degenerative change in the thoracic spine. There are no blastic or lytic bone lesions. Visualized thyroid appears normal.  Review of the MIP images confirms the above findings.  IMPRESSION: No demonstrable pulmonary embolus.  Extensive right lower lobe consolidation with obstruction of the right lower lobe bronchus. The known mass in the right lower lobe is obscured by the consolidation. There is patchy infiltrate elsewhere on the right, primarily in the medial segment right middle and posterior segment right upper lobe regions. There is mild scarring on the left medially in the upper lobe region. Left lung is otherwise clear. There is a sizable effusion on the right.  There is sub- carinal adenopathy, documented previously.  There is hepatic steatosis.   Electronically Signed   By: Lowella Grip M.D.   On: 10/20/2013 16:21   ASSESSMENT AND PLAN: This is a very pleasant 73 years old [place] male with:   1) metastatic non-small cell lung cancer initially diagnosed as stage IIIA non-small cell lung cancer who completed a course of concurrent chemoradiation with weekly carboplatin and paclitaxel with no significant complaints except for radiation induced odynophagia.  He also completed 3 cycles of consolidation chemotherapy with reduced dose carboplatin  and paclitaxel. He is currently undergoing immunotherapy with Nivolumab status post  3 cycles. His immunotherapy he is currently on hold secondary to worsening dyspnea and concern about immune mediated pneumonitis. The recent CT angiogram of the chest showed no evidence for pneumonitis but the patient has extensive right lower lobe consolidation is obstruction of the right lower lobe bronchus and a new right pleural effusion.he did undergo right-sided ultrasound-guided thoracentesis with drainage of approximately 1.5 L of fluid. He did have some improvement in his shortness of breath with exertion. Patient was discussed with and also seen by Dr. Julien Nordmann. He'll proceed with cycle #4 of his immunotherapy with normal amount today as scheduled. He will follow-up in 2 weeks with restaging CT scanof the chest with contrast to reevaluate his disease.  2) peripheral neuropathy: The patient will continue his current treatment with Neurontin but will decrease the dose to 300 mg by mouth 3 times a day in addition to oxycodone as needed. I also  started the patient on Cymbalta 60 mg by mouth daily.  3) dizzy spells: This could be secondary to his high dose of Neurontin that the patient has been taking in the morning. He is currently taking Neurontin to 300 mg twice daily.we will continue to monitor the symptoms.    He was advised to call immediately if he has any concerning symptoms in the interval.  The patient voices understanding of current disease status and treatment options and is in agreement with the current care plan.  All questions were answered. The patient knows to call the clinic with any problems, questions or concerns. We can certainly see the patient much sooner if necessary.  Disclaimer: This note was dictated with voice recognition software. Similar sounding words can inadvertently be transcribed and may not be corrected upon review.  Carlton Adam PA-C  ADDENDUM: Hematology/Oncology  Attending: I had a face to face encounter with the patient. I recommended his care plan. This is a very pleasant 73 years old [place] male with metastatic non-small cell lung cancer, squamous cell carcinoma currently undergoing immunotherapy with Nivolumab status post 3 cycles. He is tolerating his treatment with Nivolumab fairly well but continues to have persistent dyspnea secondary to collapse of the right lower lobe. The patient was not a candidate for palliative radiotherapy to the obstructing lung mass or a candidate for laser debridement. His case was discussed at the weekly thoracic conference and unfortunately no other options and systemic therapy was recommended. I recommended for him to continue his treatment with Nivolumab. He would come back for follow-up visit in 2 weeks for reevaluation after repeating CT scan of the chest for restaging of his disease. He was advised to call immediately if he has any concerning symptoms in the interval. Eilleen Kempf., MD 11/21/2013

## 2013-11-21 ENCOUNTER — Encounter: Payer: Self-pay | Admitting: Physician Assistant

## 2013-11-24 ENCOUNTER — Telehealth: Payer: Self-pay | Admitting: Medical Oncology

## 2013-11-24 NOTE — Telephone Encounter (Signed)
CT denied due to pt had one in oct. Note to [place].

## 2013-11-24 NOTE — Telephone Encounter (Signed)
I called pt and told him CT scan is cancelled for Friday -he is aware.

## 2013-11-27 ENCOUNTER — Ambulatory Visit (HOSPITAL_COMMUNITY): Admission: RE | Admit: 2013-11-27 | Payer: Medicare HMO | Source: Ambulatory Visit

## 2013-12-01 ENCOUNTER — Ambulatory Visit (HOSPITAL_BASED_OUTPATIENT_CLINIC_OR_DEPARTMENT_OTHER): Payer: Medicare HMO

## 2013-12-01 ENCOUNTER — Encounter: Payer: Self-pay | Admitting: Physician Assistant

## 2013-12-01 ENCOUNTER — Telehealth: Payer: Self-pay | Admitting: Physician Assistant

## 2013-12-01 ENCOUNTER — Ambulatory Visit (HOSPITAL_BASED_OUTPATIENT_CLINIC_OR_DEPARTMENT_OTHER): Payer: Medicare HMO | Admitting: Physician Assistant

## 2013-12-01 VITALS — BP 139/82 | HR 116 | Temp 97.9°F | Resp 19 | Ht 73.0 in | Wt 182.0 lb

## 2013-12-01 DIAGNOSIS — C3431 Malignant neoplasm of lower lobe, right bronchus or lung: Secondary | ICD-10-CM

## 2013-12-01 DIAGNOSIS — C3491 Malignant neoplasm of unspecified part of right bronchus or lung: Secondary | ICD-10-CM

## 2013-12-01 DIAGNOSIS — Z95828 Presence of other vascular implants and grafts: Secondary | ICD-10-CM

## 2013-12-01 DIAGNOSIS — G629 Polyneuropathy, unspecified: Secondary | ICD-10-CM

## 2013-12-01 DIAGNOSIS — Z79899 Other long term (current) drug therapy: Secondary | ICD-10-CM

## 2013-12-01 DIAGNOSIS — R55 Syncope and collapse: Secondary | ICD-10-CM

## 2013-12-01 DIAGNOSIS — Z5112 Encounter for antineoplastic immunotherapy: Secondary | ICD-10-CM

## 2013-12-01 DIAGNOSIS — R42 Dizziness and giddiness: Secondary | ICD-10-CM

## 2013-12-01 DIAGNOSIS — R5383 Other fatigue: Secondary | ICD-10-CM

## 2013-12-01 LAB — CBC WITH DIFFERENTIAL/PLATELET
BASO%: 0.2 % (ref 0.0–2.0)
BASOS ABS: 0 10*3/uL (ref 0.0–0.1)
EOS%: 1.5 % (ref 0.0–7.0)
Eosinophils Absolute: 0.1 10*3/uL (ref 0.0–0.5)
HCT: 36.3 % — ABNORMAL LOW (ref 38.4–49.9)
HGB: 11.3 g/dL — ABNORMAL LOW (ref 13.0–17.1)
LYMPH%: 13.6 % — ABNORMAL LOW (ref 14.0–49.0)
MCH: 26.3 pg — ABNORMAL LOW (ref 27.2–33.4)
MCHC: 31.1 g/dL — ABNORMAL LOW (ref 32.0–36.0)
MCV: 84.6 fL (ref 79.3–98.0)
MONO#: 0.7 10*3/uL (ref 0.1–0.9)
MONO%: 7.9 % (ref 0.0–14.0)
NEUT%: 76.8 % — ABNORMAL HIGH (ref 39.0–75.0)
NEUTROS ABS: 6.3 10*3/uL (ref 1.5–6.5)
Platelets: 490 10*3/uL — ABNORMAL HIGH (ref 140–400)
RBC: 4.29 10*6/uL (ref 4.20–5.82)
RDW: 16.2 % — ABNORMAL HIGH (ref 11.0–14.6)
WBC: 8.2 10*3/uL (ref 4.0–10.3)
lymph#: 1.1 10*3/uL (ref 0.9–3.3)

## 2013-12-01 LAB — COMPREHENSIVE METABOLIC PANEL (CC13)
ALT: 21 U/L (ref 0–55)
ANION GAP: 11 meq/L (ref 3–11)
AST: 19 U/L (ref 5–34)
Albumin: 3.2 g/dL — ABNORMAL LOW (ref 3.5–5.0)
Alkaline Phosphatase: 91 U/L (ref 40–150)
BUN: 14.8 mg/dL (ref 7.0–26.0)
CALCIUM: 9.9 mg/dL (ref 8.4–10.4)
CHLORIDE: 103 meq/L (ref 98–109)
CO2: 26 mEq/L (ref 22–29)
Creatinine: 0.8 mg/dL (ref 0.7–1.3)
GLUCOSE: 175 mg/dL — AB (ref 70–140)
Potassium: 4 mEq/L (ref 3.5–5.1)
SODIUM: 140 meq/L (ref 136–145)
TOTAL PROTEIN: 7.6 g/dL (ref 6.4–8.3)
Total Bilirubin: 0.36 mg/dL (ref 0.20–1.20)

## 2013-12-01 LAB — TSH CHCC: TSH: 1.481 m[IU]/L (ref 0.320–4.118)

## 2013-12-01 MED ORDER — HEPARIN SOD (PORK) LOCK FLUSH 100 UNIT/ML IV SOLN
500.0000 [IU] | Freq: Once | INTRAVENOUS | Status: AC
  Administered 2013-12-01: 500 [IU] via INTRAVENOUS
  Filled 2013-12-01: qty 5

## 2013-12-01 MED ORDER — SODIUM CHLORIDE 0.9 % IJ SOLN
10.0000 mL | INTRAMUSCULAR | Status: DC | PRN
  Administered 2013-12-01: 10 mL
  Filled 2013-12-01: qty 10

## 2013-12-01 MED ORDER — HEPARIN SOD (PORK) LOCK FLUSH 100 UNIT/ML IV SOLN
500.0000 [IU] | Freq: Once | INTRAVENOUS | Status: AC | PRN
  Administered 2013-12-01: 500 [IU]
  Filled 2013-12-01: qty 5

## 2013-12-01 MED ORDER — SODIUM CHLORIDE 0.9 % IV SOLN
Freq: Once | INTRAVENOUS | Status: AC
  Administered 2013-12-01: 11:00:00 via INTRAVENOUS

## 2013-12-01 MED ORDER — SODIUM CHLORIDE 0.9 % IJ SOLN
10.0000 mL | INTRAMUSCULAR | Status: DC | PRN
  Administered 2013-12-01: 10 mL via INTRAVENOUS
  Filled 2013-12-01: qty 10

## 2013-12-01 MED ORDER — SODIUM CHLORIDE 0.9 % IV SOLN
3.0000 mg/kg | Freq: Once | INTRAVENOUS | Status: AC
  Administered 2013-12-01: 260 mg via INTRAVENOUS
  Filled 2013-12-01: qty 26

## 2013-12-01 NOTE — Progress Notes (Signed)
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   Primary site: Lung (Right)  Staging method: AJCC 7th Edition  Clinical: Stage IIIA (T2b, N2, M0)  Summary: Stage IIIA (T2b, N2, M0)   PRIOR THERAPY:  1) Concurrent chemoradiation with weekly carboplatin for an AUC of 2 and paclitaxel 45 mg/m2, status post 4 cycles. 2) Consolidation chemotherapy with carboplatin for AUC of 5 and paclitaxel 175 mg/M2 every 3 weeks with Neulasta support, first cycle on 03/23/2013. Status post 3 cycles.  CURRENT THERAPY: Immunotherapy with Nivolumab 3 mg/KG every 2 weeks. First dose 09/21/2013. Status post 4 cycles.  DISEASE STAGE:  Lung cancer  Primary site: Lung (Right)  Staging method: AJCC 7th Edition  Clinical: Stage IIIA (T2b, N2, M0)  Summary: Stage IIIA (T2b, N2, M0)  CHEMOTHERAPY INTENT: Control/Palliative  CURRENT # OF CHEMOTHERAPY CYCLES: 5 CURRENT ANTIEMETICS: Zofran, dexamethasone, Compazine  CURRENT SMOKING STATUS: Former smoker, quit 01/09/2007  ORAL CHEMOTHERAPY AND CONSENT: n/a  CURRENT BISPHOSPHONATES USE: none  PAIN MANAGEMENT: Ibuprofen  NARCOTICS INDUCED CONSTIPATION: None  LIVING WILL AND CODE STATUS: ?  INTERVAL HISTORY: Steve [name] 73 y.o. male returns to the clinic today for followup visit accompanied by his wife. He is currently being treated with immunotherapy in the form of Nivolumab, now status post 4 cycles. Overall is tolerating treatment relatively well with the exception of some fatigue. He continues to report some episodes of syncope which he feels is related to "vertigo". He does report a history of vertigo over the past year.  He  reports improvement in his breathing over the last 2-3 days. He denied having any significant chest pain but continues to have shortness of breath with exertion with mild cough without hemoptysis. He denied having any weight loss or night sweats. The patient has no fever or chills and no nausea or vomiting. He reports some dizziness. He had a recent fall this week. He reports bending over, then standing up too quickly then, " passing out". He denied any head trauma. He presents to proceed with cycle #5 of his immunotherapy with Nivolumab  MEDICAL HISTORY: Past Medical History  Diagnosis Date  . Dizziness and giddiness     positional vertigo  . Hyperlipidemia   . Impotence of organic origin   . Osteoarthritis, shoulder   . Allergy   . Hypothyroidism     pt denies thryoid disease, dr pandey note 11-19-12 says subclinical hypothryoid in epic  . COPD (chronic obstructive pulmonary disease)   . Insomnia   . Malignant neoplasm of bladder, part unspecified   . Malignant neoplasm of bronchus and lung, unspecified site   . Diabetes mellitus without complication     pt denies diabetes, dr pandey note says dm 11-19-12 epic  . Hx of radiation therapy 02/2013    right lung    ALLERGIES:  is allergic to morphine and related.  MEDICATIONS:  Current Outpatient Prescriptions  Medication Sig Dispense Refill  . aspirin 81 MG tablet Take 81 mg by mouth daily.    . DULoxetine (CYMBALTA) 60 MG capsule Take 1 capsule (60 mg total) by mouth daily. (Patient taking differently: Take 60 mg by mouth every morning. ) 30 capsule 3  .  gabapentin (NEURONTIN) 300 MG capsule TAKE 1 CAPSULE BY MOUTH THREE TIMES DAILY 270 capsule 0  . oxyCODONE-acetaminophen (ROXICET) 5-325 MG per tablet Take 1 tablet by mouth every 6 (six) hours as needed for severe pain. 120 tablet 0  . simvastatin (ZOCOR) 20 MG tablet Take one tablet by mouth once daily to lower cholesterol 90 tablet 3  . zolpidem (AMBIEN) 10 MG tablet Take one  tablet by mouth once daily at bedtime for rest 90 tablet 0  . lidocaine-prilocaine (EMLA) cream Apply 1 application topically as needed. Apply to port 1 hour before infusion appt, cover with dressing. (Patient not taking: Reported on 12/01/2013) 30 g 0   No current facility-administered medications for this visit.   Facility-Administered Medications Ordered in Other Visits  Medication Dose Route Frequency Provider Last Rate Last Dose  . sodium chloride 0.9 % injection 10 mL  10 mL Intracatheter PRN Curt Bears, MD   10 mL at 12/01/13 1255    SURGICAL HISTORY:  Past Surgical History  Procedure Laterality Date  . Cystoscopy w/ dilation of bladder    . Tonsillectomy  age 59  . Endobronchial ultrasound Bilateral 12/15/2012    Procedure: ENDOBRONCHIAL ULTRASOUND;  Surgeon: Brand Males, MD;  Location: [facility];  Service: Cardiopulmonary;  Laterality: Bilateral;  . Colonoscopy  2009    [facility]    REVIEW OF SYSTEMS:  Constitutional: positive for fatigue Eyes: negative Ears, nose, mouth, throat, and face: negative Respiratory: positive for dyspnea on exertion and improved Cardiovascular: negative Gastrointestinal: negative Genitourinary:negative Integument/breast: negative Hematologic/lymphatic: negative Musculoskeletal:negative Neurological: positive for dizziness, paresthesia and syncope Behavioral/Psych: negative Endocrine: negative Allergic/Immunologic: negative   PHYSICAL EXAMINATION: General appearance: alert, cooperative and no distress Head: Normocephalic, without obvious abnormality, atraumatic Neck: no adenopathy, no JVD, supple, symmetrical, trachea midline and thyroid not enlarged, symmetric, no tenderness/mass/nodules Lymph nodes: Cervical, supraclavicular, and axillary nodes normal. Resp: clear to auscultation bilaterally Back: symmetric, no curvature. ROM normal. No CVA tenderness. Cardio: regular rate and rhythm, S1, S2 normal, no murmur, click, rub or  gallop GI: soft, non-tender; bowel sounds normal; no masses,  no organomegaly Extremities: extremities normal, atraumatic, no cyanosis or edema Neurologic: Alert and oriented X 3, normal strength and tone. Normal symmetric reflexes. Normal coordination and gait  ECOG PERFORMANCE STATUS: 1 - Symptomatic but completely ambulatory  Blood pressure 139/82, pulse 116, temperature 97.9 F (36.6 C), temperature source Oral, resp. rate 19, height 6\' 1"  (1.854 m), weight 182 lb (82.555 kg), SpO2 99 %.  LABORATORY DATA: Lab Results  Component Value Date   WBC 8.2 12/01/2013   HGB 11.3* 12/01/2013   HCT 36.3* 12/01/2013   MCV 84.6 12/01/2013   PLT 490* 12/01/2013      Chemistry      Component Value Date/Time   NA 140 12/01/2013 0954   NA 139 11/16/2013 0755   NA 142 04/28/2013 0819   K 4.0 12/01/2013 0954   K 4.1 11/16/2013 0755   CL 100 11/16/2013 0755   CO2 26 12/01/2013 0954   CO2 23 11/16/2013 0755   BUN 14.8 12/01/2013 0954   BUN 12 11/16/2013 0755   BUN 12 04/28/2013 0819   CREATININE 0.8 12/01/2013 0954   CREATININE 0.73 11/16/2013 0755      Component Value Date/Time   CALCIUM 9.9 12/01/2013 0954   CALCIUM 10.0 11/16/2013 0755   ALKPHOS 91 12/01/2013 0954   ALKPHOS 107 04/28/2013 0819   AST 19 12/01/2013 0954   AST 19 04/28/2013 0819   ALT  21 12/01/2013 0954   ALT 12 04/28/2013 0819   BILITOT 0.36 12/01/2013 0954   BILITOT 0.4 04/28/2013 0819       RADIOGRAPHIC STUDIES: Ct Angio Chest Pe W/cm &/or Wo Cm  10/20/2013   CLINICAL DATA:  Acute onset shortness of breath. Known lung carcinoma  EXAM: CT ANGIOGRAPHY CHEST WITH CONTRAST  TECHNIQUE: Multidetector CT imaging of the chest was performed using the standard protocol during bolus administration of intravenous contrast. Multiplanar CT image reconstructions and MIPs were obtained to evaluate the vascular anatomy.  CONTRAST:  127mL OMNIPAQUE IOHEXOL 350 MG/ML SOLN  COMPARISON:  PET-CT September 02, 2013  FINDINGS: There is  no demonstrable pulmonary embolus. There is atherosclerotic change in the aorta but no aneurysm or dissection.  There is a sizable right pleural effusion with extensive right lower lobe consolidation. There is obstruction of the right lower lobe bronchus from a known mass which currently is surrounded and obscured by consolidation. There is also patchy infiltrate in the posterior segment of the right upper lobe and medial segment of the right middle lobe.  There is mild scarring in the medial aspect of the left upper lobe anteriorly. Left lung is otherwise clear.  There is again noted sub- carinal adenopathy with an enlarged lymph node measuring 2.2 x 1.8 cm in this region. There are small mediastinal lymph nodes elsewhere which do not meet size criteria for pathologic significance.  Pericardium is not appreciably thickened.  In the visualized upper abdomen there is hepatic steatosis. There is degenerative change in the thoracic spine. There are no blastic or lytic bone lesions. Visualized thyroid appears normal.  Review of the MIP images confirms the above findings.  IMPRESSION: No demonstrable pulmonary embolus.  Extensive right lower lobe consolidation with obstruction of the right lower lobe bronchus. The known mass in the right lower lobe is obscured by the consolidation. There is patchy infiltrate elsewhere on the right, primarily in the medial segment right middle and posterior segment right upper lobe regions. There is mild scarring on the left medially in the upper lobe region. Left lung is otherwise clear. There is a sizable effusion on the right.  There is sub- carinal adenopathy, documented previously.  There is hepatic steatosis.   Electronically Signed   By: Lowella Grip M.D.   On: 10/20/2013 16:21   ASSESSMENT AND PLAN: This is a very pleasant 73 years old [place] male with:   1) metastatic non-small cell lung cancer initially diagnosed as stage IIIA non-small cell lung cancer who completed a  course of concurrent chemoradiation with weekly carboplatin and paclitaxel with no significant complaints except for radiation induced odynophagia.  He also completed 3 cycles of consolidation chemotherapy with reduced dose carboplatin and paclitaxel. He is currently undergoing immunotherapy with Nivolumab status post  4 cycles.  The recent CT angiogram of the chest showed no evidence for pneumonitis but the patient has extensive right lower lobe consolidation is obstruction of the right lower lobe bronchus and a new right pleural effusion. He did undergo right-sided ultrasound-guided thoracentesis with drainage of approximately 1.5 L of fluid. He did have some improvement in his shortness of breath with exertion. A CT of the chest on 11/02/2013 ordered by Dr. Sofie Hartigan revealed no significant change in the medial right lower lobe mass like lesion. This was obtained prior to cycle #2 of his immunotherapy with Nivolumab.Patient was discussed with and also seen by Dr. Julien Nordmann. He'll proceed with cycle #5 of his immunotherapy with Nivolumab today as  scheduled. We will plan to obtain another restaging CT scan after he has completed another 2 cycles of immunotherapy. To further evaluate his dizziness and syncope he will be scheduled for an MRI of the brain with and without contrast looking for possible brain metastasis.   2) peripheral neuropathy: The patient will continue his current treatment with Neurontin but will decrease the dose to 300 mg by mouth 3 times a day in addition to oxycodone as needed.He will continue Cymbalta 60 mg by mouth daily.  3) dizzy spells: As stated above these dizzy spells and syncopal episodes are concerning and he is scheduled for an MRI of the brain with and without contrast to be evaluated for possible brain metastasis.   He was advised to call immediately if he has any concerning symptoms in the interval.  The patient voices understanding of current disease status and treatment  options and is in agreement with the current care plan.  All questions were answered. The patient knows to call the clinic with any problems, questions or concerns. We can certainly see the patient much sooner if necessary.  Disclaimer: This note was dictated with voice recognition software. Similar sounding words can inadvertently be transcribed and may not be corrected upon review.  Carlton Adam, PA-C 12/01/2013  ADDENDUM: Hematology/Oncology Attending: I had a face to face encounter with the patient today. I recommended his care plan this is a very pleasant 73 years old [place] male with progressive non-small cell lung cancer, squamous cell carcinoma. The patient is currently undergoing treatment with immunotherapy with Nivolumab status post 4 cycles. He is tolerating his treatment fairly well with no significant adverse effect except for the persistent shortness breath increased with exertion as well as disease spells. He relates his dizzy spells to vertigo. I recommended for the patient to have repeat MRI of the brain to rule out any metastatic disease to the brain. He will continue his immunotherapy with Nivolumab as scheduled. He would have repeat CT scan of the chest after cycle #6. He was advised to call immediately if he has any concerning symptoms in the interval.  Disclaimer: This note was dictated with voice recognition software. Similar sounding words can inadvertently be transcribed and may be missed upon review. Eilleen Kempf., MD 12/01/2013

## 2013-12-01 NOTE — Telephone Encounter (Signed)
Gave avs & cal forDec. Sent mess to sch tx. °

## 2013-12-01 NOTE — Patient Instructions (Signed)
[name] Discharge Instructions for Patients Receiving Chemotherapy  Today you received the following chemotherapy agents Nivolumab  To help prevent nausea and vomiting after your treatment, we encourage you to take your nausea medication     If you develop nausea and vomiting that is not controlled by your nausea medication, call the clinic.   BELOW ARE SYMPTOMS THAT SHOULD BE REPORTED IMMEDIATELY:  *FEVER GREATER THAN 100.5 F  *CHILLS WITH OR WITHOUT FEVER  NAUSEA AND VOMITING THAT IS NOT CONTROLLED WITH YOUR NAUSEA MEDICATION  *UNUSUAL SHORTNESS OF BREATH  *UNUSUAL BRUISING OR BLEEDING  TENDERNESS IN MOUTH AND THROAT WITH OR WITHOUT PRESENCE OF ULCERS  *URINARY PROBLEMS  *BOWEL PROBLEMS  UNUSUAL RASH Items with * indicate a potential emergency and should be followed up as soon as possible.  Feel free to call the clinic you have any questions or concerns. The clinic phone number is [phone].

## 2013-12-01 NOTE — Patient Instructions (Signed)

## 2013-12-01 NOTE — Patient Instructions (Signed)
You are being scheduled for a MRI of your brain to further evaluate your episodes of dizziness and "falling out" Follow up in 2 weeks

## 2013-12-02 ENCOUNTER — Telehealth: Payer: Self-pay | Admitting: *Deleted

## 2013-12-02 NOTE — Telephone Encounter (Signed)
Per staff message and POF I have scheduled appts. Advised scheduler of appts. JMW  

## 2013-12-04 ENCOUNTER — Ambulatory Visit: Payer: Medicare HMO

## 2013-12-06 ENCOUNTER — Other Ambulatory Visit: Payer: Self-pay | Admitting: Internal Medicine

## 2013-12-14 ENCOUNTER — Telehealth: Payer: Self-pay | Admitting: *Deleted

## 2013-12-14 NOTE — Telephone Encounter (Signed)
Received phone call from patient asking if he still needs to be seen by Dr. Julien Nordmann on 12/15/13 since his MRI is not until 12/16/13.  Per Awilda Metro, PA, informed patient he still needs to come in

## 2013-12-15 ENCOUNTER — Encounter: Payer: Self-pay | Admitting: Internal Medicine

## 2013-12-15 ENCOUNTER — Ambulatory Visit (HOSPITAL_BASED_OUTPATIENT_CLINIC_OR_DEPARTMENT_OTHER): Payer: Medicare HMO | Admitting: Internal Medicine

## 2013-12-15 ENCOUNTER — Telehealth: Payer: Self-pay | Admitting: Internal Medicine

## 2013-12-15 ENCOUNTER — Ambulatory Visit (HOSPITAL_BASED_OUTPATIENT_CLINIC_OR_DEPARTMENT_OTHER): Payer: Medicare HMO

## 2013-12-15 ENCOUNTER — Telehealth: Payer: Self-pay | Admitting: *Deleted

## 2013-12-15 ENCOUNTER — Ambulatory Visit: Payer: Medicare HMO

## 2013-12-15 ENCOUNTER — Other Ambulatory Visit (HOSPITAL_BASED_OUTPATIENT_CLINIC_OR_DEPARTMENT_OTHER): Payer: Medicare HMO

## 2013-12-15 VITALS — BP 131/80 | HR 106 | Temp 97.9°F | Resp 19 | Ht 73.0 in | Wt 180.9 lb

## 2013-12-15 DIAGNOSIS — Z95828 Presence of other vascular implants and grafts: Secondary | ICD-10-CM

## 2013-12-15 DIAGNOSIS — R5383 Other fatigue: Secondary | ICD-10-CM

## 2013-12-15 DIAGNOSIS — R42 Dizziness and giddiness: Secondary | ICD-10-CM

## 2013-12-15 DIAGNOSIS — C3431 Malignant neoplasm of lower lobe, right bronchus or lung: Secondary | ICD-10-CM

## 2013-12-15 DIAGNOSIS — Z79899 Other long term (current) drug therapy: Secondary | ICD-10-CM

## 2013-12-15 DIAGNOSIS — E039 Hypothyroidism, unspecified: Secondary | ICD-10-CM

## 2013-12-15 DIAGNOSIS — G629 Polyneuropathy, unspecified: Secondary | ICD-10-CM

## 2013-12-15 DIAGNOSIS — Z5112 Encounter for antineoplastic immunotherapy: Secondary | ICD-10-CM

## 2013-12-15 DIAGNOSIS — H8110 Benign paroxysmal vertigo, unspecified ear: Secondary | ICD-10-CM

## 2013-12-15 LAB — CBC WITH DIFFERENTIAL/PLATELET
BASO%: 0.3 % (ref 0.0–2.0)
Basophils Absolute: 0 10*3/uL (ref 0.0–0.1)
EOS%: 1.6 % (ref 0.0–7.0)
Eosinophils Absolute: 0.1 10*3/uL (ref 0.0–0.5)
HCT: 36.1 % — ABNORMAL LOW (ref 38.4–49.9)
HGB: 11.1 g/dL — ABNORMAL LOW (ref 13.0–17.1)
LYMPH#: 1.3 10*3/uL (ref 0.9–3.3)
LYMPH%: 14.1 % (ref 14.0–49.0)
MCH: 25.9 pg — AB (ref 27.2–33.4)
MCHC: 30.7 g/dL — AB (ref 32.0–36.0)
MCV: 84.3 fL (ref 79.3–98.0)
MONO#: 0.8 10*3/uL (ref 0.1–0.9)
MONO%: 9.4 % (ref 0.0–14.0)
NEUT#: 6.7 10*3/uL — ABNORMAL HIGH (ref 1.5–6.5)
NEUT%: 74.6 % (ref 39.0–75.0)
Platelets: 481 10*3/uL — ABNORMAL HIGH (ref 140–400)
RBC: 4.28 10*6/uL (ref 4.20–5.82)
RDW: 16.3 % — AB (ref 11.0–14.6)
WBC: 9 10*3/uL (ref 4.0–10.3)

## 2013-12-15 LAB — COMPREHENSIVE METABOLIC PANEL (CC13)
ALBUMIN: 3.2 g/dL — AB (ref 3.5–5.0)
ALT: 21 U/L (ref 0–55)
AST: 20 U/L (ref 5–34)
Alkaline Phosphatase: 85 U/L (ref 40–150)
Anion Gap: 12 mEq/L — ABNORMAL HIGH (ref 3–11)
BUN: 17.3 mg/dL (ref 7.0–26.0)
CO2: 26 mEq/L (ref 22–29)
Calcium: 9.5 mg/dL (ref 8.4–10.4)
Chloride: 103 mEq/L (ref 98–109)
Creatinine: 0.8 mg/dL (ref 0.7–1.3)
EGFR: 88 mL/min/{1.73_m2} — AB (ref >90)
GLUCOSE: 110 mg/dL (ref 70–140)
Potassium: 4.2 mEq/L (ref 3.5–5.1)
Sodium: 141 mEq/L (ref 136–145)
Total Bilirubin: 0.26 mg/dL (ref 0.20–1.20)
Total Protein: 7.3 g/dL (ref 6.4–8.3)

## 2013-12-15 LAB — TSH CHCC: TSH: 1.461 m(IU)/L (ref 0.320–4.118)

## 2013-12-15 MED ORDER — SODIUM CHLORIDE 0.9 % IJ SOLN
10.0000 mL | INTRAMUSCULAR | Status: DC | PRN
  Administered 2013-12-15: 10 mL via INTRAVENOUS
  Filled 2013-12-15: qty 10

## 2013-12-15 MED ORDER — SODIUM CHLORIDE 0.9 % IJ SOLN
10.0000 mL | INTRAMUSCULAR | Status: DC | PRN
  Administered 2013-12-15: 10 mL
  Filled 2013-12-15: qty 10

## 2013-12-15 MED ORDER — SODIUM CHLORIDE 0.9 % IV SOLN
Freq: Once | INTRAVENOUS | Status: AC
  Administered 2013-12-15: 13:00:00 via INTRAVENOUS

## 2013-12-15 MED ORDER — HEPARIN SOD (PORK) LOCK FLUSH 100 UNIT/ML IV SOLN
500.0000 [IU] | Freq: Once | INTRAVENOUS | Status: AC | PRN
  Administered 2013-12-15: 500 [IU]
  Filled 2013-12-15: qty 5

## 2013-12-15 MED ORDER — SODIUM CHLORIDE 0.9 % IV SOLN
3.0000 mg/kg | Freq: Once | INTRAVENOUS | Status: AC
  Administered 2013-12-15: 260 mg via INTRAVENOUS
  Filled 2013-12-15: qty 26

## 2013-12-15 NOTE — Patient Instructions (Signed)
[facility] Discharge Instructions for Patients Receiving Chemotherapy  Today you received the following chemotherapy agents nivolumab   To help prevent nausea and vomiting after your treatment, we encourage you to take your nausea medication as directed   If you develop nausea and vomiting that is not controlled by your nausea medication, call the clinic.   BELOW ARE SYMPTOMS THAT SHOULD BE REPORTED IMMEDIATELY:  *FEVER GREATER THAN 100.5 F  *CHILLS WITH OR WITHOUT FEVER  NAUSEA AND VOMITING THAT IS NOT CONTROLLED WITH YOUR NAUSEA MEDICATION  *UNUSUAL SHORTNESS OF BREATH  *UNUSUAL BRUISING OR BLEEDING  TENDERNESS IN MOUTH AND THROAT WITH OR WITHOUT PRESENCE OF ULCERS  *URINARY PROBLEMS  *BOWEL PROBLEMS  UNUSUAL RASH Items with * indicate a potential emergency and should be followed up as soon as possible.  Feel free to call the clinic you have any questions or concerns. The clinic phone number is [phone].  

## 2013-12-15 NOTE — Telephone Encounter (Signed)
Per staff message and POF I have scheduled appts. Advised scheduler of appts. JMW  

## 2013-12-15 NOTE — Patient Instructions (Signed)

## 2013-12-15 NOTE — Telephone Encounter (Signed)
Gave avs & cal forDec. Sent mess to sch tx. °

## 2013-12-15 NOTE — Progress Notes (Signed)
[facility] [phone]   Fax:(336) [address], [place], [place] [address]  DIAGNOSIS: Metastatic non-small cell lung cancer initially diagnosed as Stage IIIA (T2 B., N2, M0) non-small cell lung cancer consistent with poorly differentiated squamous cell carcinoma diagnosed in December of 2014.   Primary site: Lung (Right)  Staging method: AJCC 7th Edition  Clinical: Stage IIIA (T2b, N2, M0)  Summary: Stage IIIA (T2b, N2, M0)   PRIOR THERAPY:  1) Concurrent chemoradiation with weekly carboplatin for an AUC of 2 and paclitaxel 45 mg/m2, status post 4 cycles. 2) Consolidation chemotherapy with carboplatin for AUC of 5 and paclitaxel 175 mg/M2 every 3 weeks with Neulasta support, first cycle on 03/23/2013. Status post 3 cycles.  CURRENT THERAPY: Immunotherapy with Nivolumab 3 mg/KG every 2 weeks. First dose 09/21/2013. Status post 5 cycles.  DISEASE STAGE:  Lung cancer  Primary site: Lung (Right)  Staging method: AJCC 7th Edition  Clinical: Stage IIIA (T2b, N2, M0)  Summary: Stage IIIA (T2b, N2, M0)  CHEMOTHERAPY INTENT: Control/Palliative  CURRENT # OF CHEMOTHERAPY CYCLES: 6 CURRENT ANTIEMETICS: Zofran, dexamethasone, Compazine  CURRENT SMOKING STATUS: Former smoker, quit 01/09/2007  ORAL CHEMOTHERAPY AND CONSENT: n/a  CURRENT BISPHOSPHONATES USE: none  PAIN MANAGEMENT: Ibuprofen  NARCOTICS INDUCED CONSTIPATION: None  LIVING WILL AND CODE STATUS: ?  INTERVAL HISTORY: Steve [name] 73 y.o. male returns to the clinic today for followup visit accompanied by his wife. He started treatment with Nivolumab for 5 cycles and tolerated it fairly well.  He denied having any significant chest pain but continues to have shortness of breath with exertion with mild cough without hemoptysis He denied having any weight loss or night sweats. The patient has no fever or chills and no nausea or vomiting. He  continues to have peripheral neuropathy especially in the toes which feels cold more than numbness and tingling. He was started on treatment with Cymbalta and tolerating it well. He otherwise feeling fine. He is here today to start cycle #6 of his immunotherapy. He is scheduled to have MRI of the brain tomorrow for evaluation of the recent frequent dizzy spells.  MEDICAL HISTORY: Past Medical History  Diagnosis Date  . Dizziness and giddiness     positional vertigo  . Hyperlipidemia   . Impotence of organic origin   . Osteoarthritis, shoulder   . Allergy   . Hypothyroidism     pt denies thryoid disease, dr pandey note 11-19-12 says subclinical hypothryoid in epic  . COPD (chronic obstructive pulmonary disease)   . Insomnia   . Malignant neoplasm of bladder, part unspecified   . Malignant neoplasm of bronchus and lung, unspecified site   . Diabetes mellitus without complication     pt denies diabetes, dr pandey note says dm 11-19-12 epic  . Hx of radiation therapy 02/2013    right lung    ALLERGIES:  is allergic to morphine and related.  MEDICATIONS:  Current Outpatient Prescriptions  Medication Sig Dispense Refill  . aspirin 81 MG tablet Take 81 mg by mouth daily.    . DULoxetine (CYMBALTA) 60 MG capsule Take 1 capsule (60 mg total) by mouth daily. (Patient taking differently: Take 60 mg by mouth every morning. ) 30 capsule 3  . gabapentin (NEURONTIN) 300 MG capsule TAKE 1 CAPSULE BY MOUTH THREE TIMES DAILY 270 capsule 0  . oxyCODONE-acetaminophen (ROXICET) 5-325 MG per tablet Take 1 tablet by mouth every  6 (six) hours as needed for severe pain. 120 tablet 0  . simvastatin (ZOCOR) 20 MG tablet Take one tablet by mouth once daily to lower cholesterol 90 tablet 3  . zolpidem (AMBIEN) 10 MG tablet Take one tablet by mouth once daily at bedtime for rest 90 tablet 0  . lidocaine-prilocaine (EMLA) cream Apply 1 application topically as needed. Apply to port 1 hour before infusion appt,  cover with dressing. (Patient not taking: Reported on 12/01/2013) 30 g 0   No current facility-administered medications for this visit.    SURGICAL HISTORY:  Past Surgical History  Procedure Laterality Date  . Cystoscopy w/ dilation of bladder    . Tonsillectomy  age 64  . Endobronchial ultrasound Bilateral 12/15/2012    Procedure: ENDOBRONCHIAL ULTRASOUND;  Surgeon: Brand Males, MD;  Location: [facility];  Service: Cardiopulmonary;  Laterality: Bilateral;  . Colonoscopy  2009    [facility]    REVIEW OF SYSTEMS:  Constitutional: negative Eyes: negative Ears, nose, mouth, throat, and face: negative Respiratory: positive for cough, dyspnea on exertion and wheezing Cardiovascular: negative Gastrointestinal: negative Genitourinary:negative Integument/breast: negative Hematologic/lymphatic: negative Musculoskeletal:negative Neurological: positive for paresthesia Behavioral/Psych: negative Endocrine: negative Allergic/Immunologic: negative   PHYSICAL EXAMINATION: General appearance: alert, cooperative and no distress Head: Normocephalic, without obvious abnormality, atraumatic Neck: no adenopathy, no JVD, supple, symmetrical, trachea midline and thyroid not enlarged, symmetric, no tenderness/mass/nodules Lymph nodes: Cervical, supraclavicular, and axillary nodes normal. Resp: clear to auscultation bilaterally Back: symmetric, no curvature. ROM normal. No CVA tenderness. Cardio: regular rate and rhythm, S1, S2 normal, no murmur, click, rub or gallop GI: soft, non-tender; bowel sounds normal; no masses,  no organomegaly Extremities: extremities normal, atraumatic, no cyanosis or edema Neurologic: Alert and oriented X 3, normal strength and tone. Normal symmetric reflexes. Normal coordination and gait  ECOG PERFORMANCE STATUS: 1 - Symptomatic but completely ambulatory  Blood pressure 131/80, pulse 106, temperature 97.9 F (36.6 C), temperature source Oral, resp. rate 19,  height 6\' 1"  (1.854 m), weight 180 lb 14.4 oz (82.056 kg), SpO2 98 %.  LABORATORY DATA: Lab Results  Component Value Date   WBC 9.0 12/15/2013   HGB 11.1* 12/15/2013   HCT 36.1* 12/15/2013   MCV 84.3 12/15/2013   PLT 481* 12/15/2013      Chemistry      Component Value Date/Time   NA 140 12/01/2013 0954   NA 139 11/16/2013 0755   NA 142 04/28/2013 0819   K 4.0 12/01/2013 0954   K 4.1 11/16/2013 0755   CL 100 11/16/2013 0755   CO2 26 12/01/2013 0954   CO2 23 11/16/2013 0755   BUN 14.8 12/01/2013 0954   BUN 12 11/16/2013 0755   BUN 12 04/28/2013 0819   CREATININE 0.8 12/01/2013 0954   CREATININE 0.73 11/16/2013 0755      Component Value Date/Time   CALCIUM 9.9 12/01/2013 0954   CALCIUM 10.0 11/16/2013 0755   ALKPHOS 91 12/01/2013 0954   ALKPHOS 107 04/28/2013 0819   AST 19 12/01/2013 0954   AST 19 04/28/2013 0819   ALT 21 12/01/2013 0954   ALT 12 04/28/2013 0819   BILITOT 0.36 12/01/2013 0954   BILITOT 0.4 04/28/2013 0819       RADIOGRAPHIC STUDIES:  ASSESSMENT AND PLAN: This is a very pleasant 73 years old Steve [name] male with:   1) metastatic non-small cell lung cancer initially diagnosed as stage IIIA non-small cell lung cancer who completed a course of concurrent chemoradiation with weekly carboplatin and paclitaxel with  no significant complaints except for radiation induced odynophagia.  He also completed 3 cycles of consolidation chemotherapy with reduced dose carboplatin and paclitaxel. He is currently undergoing immunotherapy with Nivolumab status post  5 cycles. He is tolerating this treatment fairly well. We will proceed with cycle #6 today as a scheduled.  2) peripheral neuropathy: The patient will continue his current treatment with Neurontin but will decrease the dose to 300 mg by mouth 3 times a day in addition to Cymbalta and oxycodone as needed.   3) dizzy spells: He has MRI of the brain scheduled tomorrow for evaluation and to rule out brain  metastasis.  He would come back for followup visit in 2 weeks before the next cycle of his treatment for reevaluation.  He was advised to call immediately if he has any concerning symptoms in the interval.  The patient voices understanding of current disease status and treatment options and is in agreement with the current care plan.  All questions were answered. The patient knows to call the clinic with any problems, questions or concerns. We can certainly see the patient much sooner if necessary.  Disclaimer: This note was dictated with voice recognition software. Similar sounding words can inadvertently be transcribed and may not be corrected upon review.

## 2013-12-16 ENCOUNTER — Other Ambulatory Visit: Payer: Self-pay | Admitting: *Deleted

## 2013-12-16 ENCOUNTER — Ambulatory Visit (HOSPITAL_COMMUNITY)
Admission: RE | Admit: 2013-12-16 | Discharge: 2013-12-16 | Disposition: A | Discharge status: Home / Self Care | Payer: Medicare HMO | Source: Ambulatory Visit | Attending: Physician Assistant | Admitting: Physician Assistant

## 2013-12-16 DIAGNOSIS — G894 Chronic pain syndrome: Secondary | ICD-10-CM

## 2013-12-16 DIAGNOSIS — R55 Syncope and collapse: Secondary | ICD-10-CM | POA: Diagnosis not present

## 2013-12-16 DIAGNOSIS — C3431 Malignant neoplasm of lower lobe, right bronchus or lung: Secondary | ICD-10-CM | POA: Insufficient documentation

## 2013-12-16 MED ORDER — OXYCODONE-ACETAMINOPHEN 5-325 MG PO TABS: 1.0000 | ORAL_TABLET | Freq: Four times a day (QID) | ORAL | Status: DC | PRN

## 2013-12-16 MED ORDER — GADOBENATE DIMEGLUMINE 529 MG/ML IV SOLN
20.0000 mL | Freq: Once | INTRAVENOUS | Status: AC | PRN
  Administered 2013-12-16: 17 mL via INTRAVENOUS

## 2013-12-16 NOTE — Telephone Encounter (Signed)
Patient Requested and will pick up 

## 2013-12-17 ENCOUNTER — Emergency Department (HOSPITAL_BASED_OUTPATIENT_CLINIC_OR_DEPARTMENT_OTHER)
Admission: EM | Admit: 2013-12-17 | Discharge: 2013-12-17 | Disposition: A | Discharge status: Home / Self Care | Payer: Medicare HMO | Attending: Emergency Medicine | Admitting: Emergency Medicine

## 2013-12-17 ENCOUNTER — Encounter (HOSPITAL_BASED_OUTPATIENT_CLINIC_OR_DEPARTMENT_OTHER): Payer: Self-pay | Admitting: *Deleted

## 2013-12-17 ENCOUNTER — Other Ambulatory Visit: Payer: Self-pay

## 2013-12-17 DIAGNOSIS — Y92195 Garage of other specified residential institution as the place of occurrence of the external cause: Secondary | ICD-10-CM | POA: Diagnosis not present

## 2013-12-17 DIAGNOSIS — Z79899 Other long term (current) drug therapy: Secondary | ICD-10-CM | POA: Insufficient documentation

## 2013-12-17 DIAGNOSIS — Z85118 Personal history of other malignant neoplasm of bronchus and lung: Secondary | ICD-10-CM | POA: Insufficient documentation

## 2013-12-17 DIAGNOSIS — Z8551 Personal history of malignant neoplasm of bladder: Secondary | ICD-10-CM | POA: Insufficient documentation

## 2013-12-17 DIAGNOSIS — Y998 Other external cause status: Secondary | ICD-10-CM | POA: Insufficient documentation

## 2013-12-17 DIAGNOSIS — W2209XA Striking against other stationary object, initial encounter: Secondary | ICD-10-CM | POA: Diagnosis not present

## 2013-12-17 DIAGNOSIS — Z87891 Personal history of nicotine dependence: Secondary | ICD-10-CM | POA: Insufficient documentation

## 2013-12-17 DIAGNOSIS — Z7982 Long term (current) use of aspirin: Secondary | ICD-10-CM | POA: Insufficient documentation

## 2013-12-17 DIAGNOSIS — S0101XA Laceration without foreign body of scalp, initial encounter: Secondary | ICD-10-CM | POA: Diagnosis not present

## 2013-12-17 DIAGNOSIS — R55 Syncope and collapse: Secondary | ICD-10-CM | POA: Diagnosis not present

## 2013-12-17 DIAGNOSIS — Y9389 Activity, other specified: Secondary | ICD-10-CM | POA: Insufficient documentation

## 2013-12-17 DIAGNOSIS — J449 Chronic obstructive pulmonary disease, unspecified: Secondary | ICD-10-CM | POA: Insufficient documentation

## 2013-12-17 DIAGNOSIS — E119 Type 2 diabetes mellitus without complications: Secondary | ICD-10-CM | POA: Insufficient documentation

## 2013-12-17 DIAGNOSIS — E785 Hyperlipidemia, unspecified: Secondary | ICD-10-CM | POA: Diagnosis not present

## 2013-12-17 DIAGNOSIS — S0990XA Unspecified injury of head, initial encounter: Secondary | ICD-10-CM | POA: Diagnosis present

## 2013-12-17 DIAGNOSIS — M1990 Unspecified osteoarthritis, unspecified site: Secondary | ICD-10-CM | POA: Insufficient documentation

## 2013-12-17 DIAGNOSIS — G4700 Insomnia, unspecified: Secondary | ICD-10-CM | POA: Insufficient documentation

## 2013-12-17 DIAGNOSIS — G629 Polyneuropathy, unspecified: Secondary | ICD-10-CM | POA: Insufficient documentation

## 2013-12-17 HISTORY — DX: Polyneuropathy, unspecified: G62.9

## 2013-12-17 LAB — TROPONIN I: Troponin I: <0.3 ng/mL (ref <0.30)

## 2013-12-17 LAB — CBG MONITORING, ED: GLUCOSE-CAPILLARY: 108 mg/dL — AB (ref 70–99)

## 2013-12-17 MED ORDER — LIDOCAINE-EPINEPHRINE-TETRACAINE (LET) SOLUTION
NASAL | Status: AC
  Filled 2013-12-17: qty 3

## 2013-12-17 MED ORDER — LIDOCAINE-EPINEPHRINE 2 %-1:100000 IJ SOLN
20.0000 mL | Freq: Once | INTRAMUSCULAR | Status: AC
  Administered 2013-12-17: 20 mL
  Filled 2013-12-17: qty 1

## 2013-12-17 MED ORDER — LIDOCAINE-EPINEPHRINE-TETRACAINE (LET) SOLUTION
3.0000 mL | Freq: Once | NASAL | Status: AC
  Administered 2013-12-17: 6 mL via TOPICAL
  Filled 2013-12-17: qty 3

## 2013-12-17 NOTE — ED Provider Notes (Signed)
CSN: [id number]     Arrival date & time 12/17/13  1725 History   First MD Initiated Contact with Patient 12/17/13 1751     Chief Complaint  Patient presents with  . Fall     (Consider location/radiation/quality/duration/timing/severity/associated sxs/prior Treatment) HPI The patient had an episode of passing out today. He reports this is the fourth time this has happened in approximately 2 months. He reports he gets symptoms of a fullness or warm sensation coming up from about the center or upper chest. He describes a fullness and pulsating feeling coming up his neck and then into his head. He reports that once this warm for feeling hits his head he has a complete loss of consciousness. He reports he falls to the ground and is awake again within a few seconds and is aware of his surroundings. He reports he did get some preceding symptoms and was holding onto his car when this hit. He reports he was unable to get to a seated position and ended up falling and hitting the back of his head. He denies any headache in association with this. He reports he takes daily aspirin but not other blood thinners. He denies associated nausea or vomiting or visual changes. The patient actually had an MRI done yesterday as part of his workup for these syncopal episodes. He reports he had seen the cardiologist about 3 months ago but at that time he was not experiencing syncope. He has never worn a Holter monitor. Past Medical History  Diagnosis Date  . Dizziness and giddiness     positional vertigo  . Hyperlipidemia   . Impotence of organic origin   . Osteoarthritis, shoulder   . Allergy   . Hypothyroidism     pt denies thryoid disease, dr pandey note 11-19-12 says subclinical hypothryoid in epic  . COPD (chronic obstructive pulmonary disease)   . Insomnia   . Malignant neoplasm of bladder, part unspecified   . Malignant neoplasm of bronchus and lung, unspecified site   . Hx of radiation therapy 02/2013     right lung  . Diabetes mellitus without complication     pt denies diabetes, dr pandey note says dm 11-19-12 epic  . 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 Cystoscopy w/ dilation of bladder    . Tonsillectomy  age 58  . Endobronchial ultrasound Bilateral 12/15/2012    Procedure: ENDOBRONCHIAL ULTRASOUND;  Surgeon: Brand Males, MD;  Location: [facility];  Service: Cardiopulmonary;  Laterality: Bilateral;  . Colonoscopy  2009    [facility]   Family History  Problem Relation Age of Onset  . Kidney disease Brother    History  Substance Use Topics  . Smoking status: Former Smoker -- 1.50 packs/day for 50 years    Types: Cigarettes    Quit date: 01/09/2007  . Smokeless tobacco: Current User    Types: Chew  . Alcohol Use: Yes     Comment: 4-5 beers a month, 1 mixed drink every 2 months    Review of Systems   10 Systems reviewed and are negative for acute change except as noted in the HPI.  Allergies  Morphine and related  Home Medications   Prior to Admission medications   Medication Sig Start Date End Date Taking? Authorizing Provider  DULoxetine (CYMBALTA) 60 MG capsule Take 1 capsule (60 mg total) by mouth daily. Patient taking differently: Take 60 mg by mouth every  morning.  10/27/13  Yes Curt Bears, MD  gabapentin (NEURONTIN) 300 MG capsule TAKE 1 CAPSULE BY MOUTH THREE TIMES DAILY 12/07/13  Yes Mahima Bubba Camp, MD  oxyCODONE-acetaminophen (ROXICET) 5-325 MG per tablet Take 1 tablet by mouth every 6 (six) hours as needed for severe pain. 12/16/13  Yes [name], MD  simvastatin (ZOCOR) 20 MG tablet Take one tablet by mouth once daily to lower cholesterol 10/21/13  Yes [name], MD  zolpidem (AMBIEN) 10 MG tablet Take one tablet by mouth once daily at bedtime for rest 11/19/13  Yes Mahima Pandey, MD  aspirin 81 MG tablet Take 81 mg by mouth daily.    Historical Provider, MD   lidocaine-prilocaine (EMLA) cream Apply 1 application topically as needed. Apply to port 1 hour before infusion appt, cover with dressing. 11/09/13   Curt Bears, MD   BP 130/77 mmHg  Pulse 110  Temp(Src) 97.9 F (36.6 C) (Oral)  Resp 20  Ht 6' 0.5" (1.842 m)  Wt 179 lb (81.194 kg)  BMI 23.93 kg/m2  SpO2 96% Physical Exam  Constitutional: He is oriented to person, place, and time. He appears well-developed and well-nourished.  HENT:  Right Ear: External ear normal.  Left Ear: External ear normal.  Nose: Nose normal.  Mouth/Throat: Oropharynx is clear and moist.  The patient has posterior scalp laceration which is 4 cm in length. It is full-thickness through the dermis. It is not actively bleeding. No surrounding hematoma.  Eyes: EOM are normal. Pupils are equal, round, and reactive to light.  Neck: Neck supple.  Cardiovascular: Normal rate, regular rhythm, normal heart sounds and intact distal pulses.   Pulmonary/Chest: Effort normal. No respiratory distress.  Slightly decreased breath sounds on the right. No gross wheeze or rhonchi.  Abdominal: He exhibits no distension.  Musculoskeletal: Normal range of motion. He exhibits no edema or tenderness.  Neurological: He is alert and oriented to person, place, and time. He has normal strength. No cranial nerve deficit. Coordination normal. GCS eye subscore is 4. GCS verbal subscore is 5. GCS motor subscore is 6.  Skin: Skin is warm, dry and intact.  Psychiatric: He has a normal mood and affect.    ED Course  LACERATION REPAIR Date/Time: 12/17/2013 8:01 PM Performed by: Charlesetta Shanks Authorized by: Charlesetta Shanks Consent: Verbal consent obtained. Consent given by: patient Body area: head/neck Location details: scalp Laceration length: 4 cm Foreign bodies: no foreign bodies Tendon involvement: none Nerve involvement: none Vascular damage: no Anesthesia: local infiltration Local anesthetic: lidocaine 2% with  epinephrine Anesthetic total: 3 ml Amount of cleaning: standard Debridement: none Degree of undermining: none Skin closure: staples Number of sutures: 6 Approximation difficulty: simple Dressing: 4x4 sterile gauze and antibiotic ointment Patient tolerance: Patient tolerated the procedure well with no immediate complications   (including critical care time) Labs Review Labs Reviewed  CBG MONITORING, ED - Abnormal; Notable for the following:    Glucose-Capillary 108 (*)    All other components within normal limits  TROPONIN I    Imaging Review Mr Jeri Cos Wo Contrast  12/16/2013   CLINICAL DATA:  Metastatic lung cancer.  EXAM: MRI HEAD WITHOUT AND WITH CONTRAST  TECHNIQUE: Multiplanar, multiecho pulse sequences of the brain and surrounding structures were obtained without and with intravenous contrast.  CONTRAST:  20mL MULTIHANCE GADOBENATE DIMEGLUMINE 529 MG/ML IV SOLN  COMPARISON:  None.  FINDINGS: No enhancing lesions are evident to suggest metastatic disease of the brain or meninges.  No acute infarct  or hemorrhage is present. Moderate periventricular and subcortical Nodal matter changes bilaterally greater than expected for age. There is no associated enhancement.  Flow is present in the major intracranial arteries. The globes and orbits are intact. Midline structures are within normal limits. The paranasal sinuses and left mastoid air cells are clear.  A 12 mm subcutaneous lesion posterior than midline is compatible with a sebaceous cyst.  IMPRESSION: 1. No acute intracranial abnormality. 2. No evidence for metastatic disease to the brain. 3. Moderate Rogness matter disease is advanced for age. This likely reflects the sequela of chronic microvascular ischemia.   Electronically Signed   By: Lawrence Santiago M.D.   On: 12/16/2013 16:25     EKG Interpretation None      MDM   Final diagnoses:  Syncope and collapse  Scalp laceration, initial encounter   The patient describes a syncopal  episode. These have been developing for several months now. The patient is made aware of the possibility of this being cardiac dysrhythmia in nature. He does not have any chest pain at this point time and is asymptomatic. The patient refuses admission and transfer for further monitoring. He is counseled of the risks of possible sudden death and permanent disability if there is an undiagnosed dysrhythmia. Reports that he intends to call his cardiologist first thing in the morning to address this. The patient had a fall associated with a syncopal episode. He has no sensation headache and is not on anticoagulants. At this point time there is no suggestion of intracranial injury. His lacerations repaired.     Charlesetta Shanks, MD 12/17/13 2009

## 2013-12-17 NOTE — ED Notes (Signed)
Patient states he a history of syncope for the last three months.  Has been seen and worked up for these new issues.  States he had a brain scan yesterday.   States he does have an aura before he falls, as well as a history of vertigo.  Today he fell in his garage, striking his head on the door jam, with a large laceration.  Has an abrasion on his left elbow.

## 2013-12-17 NOTE — Discharge Instructions (Signed)
Laceration Care, Adult °A laceration is a cut or lesion that goes through all layers of the skin and into the tissue just beneath the skin. °TREATMENT  °Some lacerations may not require closure. Some lacerations may not be able to be closed due to an increased risk of infection. It is important to see your caregiver as soon as possible after an injury to minimize the risk of infection and maximize the opportunity for successful closure. °If closure is appropriate, pain medicines may be given, if needed. The wound will be cleaned to help prevent infection. Your caregiver will use stitches (sutures), staples, wound glue (adhesive), or skin adhesive strips to repair the laceration. These tools bring the skin edges together to allow for faster healing and a better cosmetic outcome. However, all wounds will heal with a scar. Once the wound has healed, scarring can be minimized by covering the wound with sunscreen during the day for 1 full year. °HOME CARE INSTRUCTIONS  °For sutures or staples: °· Keep the wound clean and dry. °· If you were given a bandage (dressing), you should change it at least once a day. Also, change the dressing if it becomes wet or dirty, or as directed by your caregiver. °· Wash the wound with soap and water 2 times a day. Rinse the wound off with water to remove all soap. Pat the wound dry with a clean towel. °· After cleaning, apply a thin layer of the antibiotic ointment as recommended by your caregiver. This will help prevent infection and keep the dressing from sticking. °· You may shower as usual after the first 24 hours. Do not soak the wound in water until the sutures are removed. °· Only take over-the-counter or prescription medicines for pain, discomfort, or fever as directed by your caregiver. °· Get your sutures or staples removed as directed by your caregiver. °For skin adhesive strips: °· Keep the wound clean and dry. °· Do not get the skin adhesive strips wet. You may bathe  carefully, using caution to keep the wound dry. °· If the wound gets wet, pat it dry with a clean towel. °· Skin adhesive strips will fall off on their own. You may trim the strips as the wound heals. Do not remove skin adhesive strips that are still stuck to the wound. They will fall off in time. °For wound adhesive: °· You may briefly wet your wound in the shower or bath. Do not soak or scrub the wound. Do not swim. Avoid periods of heavy perspiration until the skin adhesive has fallen off on its own. After showering or bathing, gently pat the wound dry with a clean towel. °· Do not apply liquid medicine, cream medicine, or ointment medicine to your wound while the skin adhesive is in place. This may loosen the film before your wound is healed. °· If a dressing is placed over the wound, be careful not to apply tape directly over the skin adhesive. This may cause the adhesive to be pulled off before the wound is healed. °· Avoid prolonged exposure to sunlight or tanning lamps while the skin adhesive is in place. Exposure to ultraviolet light in the first year will darken the scar. °· The skin adhesive will usually remain in place for 5 to 10 days, then naturally fall off the skin. Do not pick at the adhesive film. °You may need a tetanus shot if: °· You cannot remember when you had your last tetanus shot. °· You have never had a tetanus   shot. If you get a tetanus shot, your arm may swell, get red, and feel warm to the touch. This is common and not a problem. If you need a tetanus shot and you choose not to have one, there is a rare chance of getting tetanus. Sickness from tetanus can be serious. SEEK MEDICAL CARE IF:   You have redness, swelling, or increasing pain in the wound.  You see a red line that goes away from the wound.  You have yellowish-Gainor fluid (pus) coming from the wound.  You have a fever.  You notice a bad smell coming from the wound or dressing.  Your wound breaks open before or  after sutures have been removed.  You notice something coming out of the wound such as wood or glass.  Your wound is on your hand or foot and you cannot move a finger or toe. SEEK IMMEDIATE MEDICAL CARE IF:   Your pain is not controlled with prescribed medicine.  You have severe swelling around the wound causing pain and numbness or a change in color in your arm, hand, leg, or foot.  Your wound splits open and starts bleeding.  You have worsening numbness, weakness, or loss of function of any joint around or beyond the wound.  You develop painful lumps near the wound or on the skin anywhere on your body. MAKE SURE YOU:   Understand these instructions.  Will watch your condition.  Will get help right away if you are not doing well or get worse. Document Released: 12/25/2004 Document Revised: 03/19/2011 Document Reviewed: 06/20/2010 [facility] Patient Information 2015 [place], [place]. This information is not intended to replace advice given to you by your health care provider. Make sure you discuss any questions you have with your health care provider. Syncope Syncope is a medical term for fainting or passing out. This means you lose consciousness and drop to the ground. People are generally unconscious for less than 5 minutes. You may have some muscle twitches for up to 15 seconds before waking up and returning to normal. Syncope occurs more often in older adults, but it can happen to anyone. While most causes of syncope are not dangerous, syncope can be a sign of a serious medical problem. It is important to seek medical care.  CAUSES  Syncope is caused by a sudden drop in blood flow to the brain. The specific cause is often not determined. Factors that can bring on syncope include:  Taking medicines that lower blood pressure.  Sudden changes in posture, such as standing up quickly.  Taking more medicine than prescribed.  Standing in one place for too long.  Seizure  disorders.  Dehydration and excessive exposure to heat.  Low blood sugar (hypoglycemia).  Straining to have a bowel movement.  Heart disease, irregular heartbeat, or other circulatory problems.  Fear, emotional distress, seeing blood, or severe pain. SYMPTOMS  Right before fainting, you may:  Feel dizzy or light-headed.  Feel nauseous.  See all [place] or all black in your field of vision.  Have cold, clammy skin. DIAGNOSIS  Your health care provider will ask about your symptoms, perform a physical exam, and perform an electrocardiogram (ECG) to record the electrical activity of your heart. Your health care provider may also perform other heart or blood tests to determine the cause of your syncope which may include:  Transthoracic echocardiogram (TTE). During echocardiography, sound waves are used to evaluate how blood flows through your heart.  Transesophageal echocardiogram (TEE).  Cardiac monitoring.  This allows your health care provider to monitor your heart rate and rhythm in real time.  Holter monitor. This is a portable device that records your heartbeat and can help diagnose heart arrhythmias. It allows your health care provider to track your heart activity for several days, if needed.  Stress tests by exercise or by giving medicine that makes the heart beat faster. TREATMENT  In most cases, no treatment is needed. Depending on the cause of your syncope, your health care provider may recommend changing or stopping some of your medicines. HOME CARE INSTRUCTIONS  Have someone stay with you until you feel stable.  Do not drive, use machinery, or play sports until your health care provider says it is okay.  Keep all follow-up appointments as directed by your health care provider.  Lie down right away if you start feeling like you might faint. Breathe deeply and steadily. Wait until all the symptoms have passed.  Drink enough fluids to keep your urine clear or pale  yellow.  If you are taking blood pressure or heart medicine, get up slowly and take several minutes to sit and then stand. This can reduce dizziness. SEEK IMMEDIATE MEDICAL CARE IF:   You have a severe headache.  You have unusual pain in the chest, abdomen, or back.  You are bleeding from your mouth or rectum, or you have black or tarry stool.  You have an irregular or very fast heartbeat.  You have pain with breathing.  You have repeated fainting or seizure-like jerking during an episode.  You faint when sitting or lying down.  You have confusion.  You have trouble walking.  You have severe weakness.  You have vision problems. If you fainted, call your local emergency services (911 in [place] Do not drive yourself to the hospital.  MAKE SURE YOU:  Understand these instructions.  Will watch your condition.  Will get help right away if you are not doing well or get worse. Document Released: 12/25/2004 Document Revised: 12/30/2012 Document Reviewed: 02/23/2011 [facility] Patient Information 2015 [place], [place]. This information is not intended to replace advice given to you by your health care provider. Make sure you discuss any questions you have with your health care provider.  Return or see your family doctor for staple removal in 7-10 days.

## 2013-12-17 NOTE — ED Notes (Signed)
MD at bedside. 

## 2013-12-21 ENCOUNTER — Ambulatory Visit (INDEPENDENT_AMBULATORY_CARE_PROVIDER_SITE_OTHER): Payer: Medicare HMO | Admitting: Physician Assistant

## 2013-12-21 ENCOUNTER — Encounter: Payer: Self-pay | Admitting: Physician Assistant

## 2013-12-21 VITALS — BP 113/73 | HR 100 | Ht 72.5 in | Wt 177.0 lb

## 2013-12-21 DIAGNOSIS — R55 Syncope and collapse: Secondary | ICD-10-CM

## 2013-12-21 DIAGNOSIS — I480 Paroxysmal atrial fibrillation: Secondary | ICD-10-CM

## 2013-12-21 HISTORY — DX: Syncope and collapse: R55

## 2013-12-21 MED ORDER — MIDODRINE HCL 2.5 MG PO TABS: 2.5000 mg | ORAL_TABLET | Freq: Two times a day (BID) | ORAL | Status: DC

## 2013-12-21 NOTE — Assessment & Plan Note (Signed)
Patient has had 4 episodes of syncope. To the episodes occurred when standing up quickly but the last episode occurred after he had walked for a while. This patient was seen and evaluated with Dr. Harrington Challenger. She walked him around the office with a pulse ox on and his heart rate went from 84 up to 119 but stated regular. Was also orthostatic with blood pressure dropping from 120/81-107/74. She would like to start him on midodrine 2.5 mg twice a day to bring his blood pressure up. We will also place an event recorder to rule out any significant arrhythmias.

## 2013-12-21 NOTE — Patient Instructions (Signed)
Your physician has recommended you make the following change in your medication:    START TAKING MIDODRAINE 2.5 MG TWICE A DAY ONCE IN THE AM AND @ 2 PM     Your physician has recommended that you wear an event monitor. Event monitors are medical devices that record the heart's electrical activity. Doctors most often [place] these monitors to diagnose arrhythmias. Arrhythmias are problems with the speed or rhythm of the heartbeat. The monitor is a small, portable device. You can wear one while you do your normal daily activities. This is usually used to diagnose what is causing palpitations/syncope (passing out).   Your physician recommends that you schedule a follow-up appointment in:  WITH DR ROSS IN 2 TO 3 WEEKS PER DR ROSS

## 2013-12-21 NOTE — Progress Notes (Signed)
HPI: This is a 73 year old male patient of Dr. Dorris Carnes has a history of intermittent atrial fibrillation on Holter. Average heart rate was 90. She placed him on diltiazem but not anticoagulation because of going through chemotherapy. 2-D echo was normal. Patient only took diltiazem for 30 days and then stopped it.  The patient has had 4 syncopal episodes in recent months first 2 episodes occurred when he stood up quickly from bending down. The second 2 episodes he had been walking for a little while and he became dizzy he tried to catch himself by holding onto a car but he completely passed out. He had to go to the emergency room for staples in his head. Patient stated he was orthostatic in the emergency room. EKG showed sinus tachycardia at 108 bpm. Patient states he has gotten dehydrated with the chemotherapy but has been very careful about drinking a lot of Gatorade and fluids and didn't feel like he was dehydrated traded this time although chemotherapy was the day before. He has lost 33 pounds from the chemotherapy and has a collapsed lung and had to have fluid drawn off. He has chronic dyspnea on exertion with very short walking.  Allergies  Allergen Reactions  . Morphine And Related Hives     Current Outpatient Prescriptions  Medication Sig Dispense Refill  . aspirin 81 MG tablet Take 81 mg by mouth daily.    . DULoxetine (CYMBALTA) 60 MG capsule Take 1 capsule (60 mg total) by mouth daily. (Patient taking differently: Take 60 mg by mouth every morning. ) 30 capsule 3  . gabapentin (NEURONTIN) 300 MG capsule TAKE 1 CAPSULE BY MOUTH THREE TIMES DAILY 270 capsule 0  . oxyCODONE-acetaminophen (ROXICET) 5-325 MG per tablet Take 1 tablet by mouth every 6 (six) hours as needed for severe pain. 120 tablet 0  . simvastatin (ZOCOR) 20 MG tablet Take one tablet by mouth once daily to lower cholesterol 90 tablet 3  . zolpidem (AMBIEN) 10 MG tablet Take one tablet by mouth once daily at  bedtime for rest 90 tablet 0   No current facility-administered medications for this visit.    Past Medical History  Diagnosis Date  . Dizziness and giddiness     positional vertigo  . Hyperlipidemia   . Impotence of organic origin   . Osteoarthritis, shoulder   . Allergy   . Hypothyroidism     pt denies thryoid disease, dr pandey note 11-19-12 says subclinical hypothryoid in epic  . COPD (chronic obstructive pulmonary disease)   . Insomnia   . Malignant neoplasm of bladder, part unspecified   . Malignant neoplasm of bronchus and lung, unspecified site   . Hx of radiation therapy 02/2013    right lung  . Diabetes mellitus without complication     pt denies diabetes, dr pandey note says dm 11-19-12 epic  . 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 Cystoscopy w/ dilation of bladder    . Tonsillectomy  age 56  . Endobronchial ultrasound Bilateral 12/15/2012    Procedure: ENDOBRONCHIAL ULTRASOUND;  Surgeon: Brand Males, MD;  Location: [facility];  Service: Cardiopulmonary;  Laterality: Bilateral;  . Colonoscopy  2009    [facility]    Family History  Problem Relation Age of Onset  . Kidney disease Brother     History   Social History  . Marital Status: Married  Spouse Name: N/A    Number of Children: N/A  . Years of Education: N/A   Occupational History  . business owner    Social History Main Topics  . Smoking status: Former Smoker -- 1.50 packs/day for 50 years    Types: Cigarettes    Quit date: 01/09/2007  . Smokeless tobacco: Current User    Types: Chew  . Alcohol Use: Yes     Comment: 4-5 beers a month, 1 mixed drink every 2 months  . Drug Use: No  . Sexual Activity: Not on file   Other Topics Concern  . Not on file   Social History Narrative    ROS: See history of present illness otherwise negative  BP 128/82 mmHg  Pulse 100  Ht 6' 0.5" (1.842 m)  Wt 177 lb (80.287 kg)   BMI 23.66 kg/m2 see vitals for orthostatics  PHYSICAL EXAM: Well-nournished, in no acute distress. Neck: No JVD, HJR, Bruit, or thyroid enlargement  Lungs: Decreased breath sounds at the right base with a few crackles No tachypnea, clear without wheezing, rales, or rhonchi  Cardiovascular: RRR, PMI not displaced, Normal S1 and S2, no murmurs, gallops, bruit, thrill, or heave.  Abdomen: BS normal. Soft without organomegaly, masses, lesions or tenderness.  Extremities: without cyanosis, clubbing or edema. Good distal pulses bilateral  SKin: Warm, no lesions or rashes   Musculoskeletal: No deformities  Neuro: no focal signs   Wt Readings from Last 3 Encounters:  12/21/13 177 lb (80.287 kg)  12/17/13 179 lb (81.194 kg)  12/16/13 180 lb (81.647 kg)    Lab Results  Component Value Date   WBC 9.0 12/15/2013   HGB 11.1* 12/15/2013   HCT 36.1* 12/15/2013   PLT 481* 12/15/2013   GLUCOSE 110 12/15/2013   TRIG 167* 04/28/2013   HDL 37* 04/28/2013   LDLCALC 110* 04/28/2013   ALT 21 12/15/2013   AST 20 12/15/2013   NA 141 12/15/2013   K 4.2 12/15/2013   CL 100 11/16/2013   CREATININE 0.8 12/15/2013   BUN 17.3 12/15/2013   CO2 26 12/15/2013   TSH 1.461 12/15/2013   INR 1.10 11/16/2013   HGBA1C 6.1* 04/28/2013    EKG: Sinus tachycardia at 100 bpm

## 2013-12-21 NOTE — Assessment & Plan Note (Signed)
Patient is in normal sinus rhythm today. We'll place an event recorder rule out any significant arrhythmias.

## 2013-12-22 ENCOUNTER — Encounter (INDEPENDENT_AMBULATORY_CARE_PROVIDER_SITE_OTHER): Payer: Medicare HMO

## 2013-12-22 ENCOUNTER — Encounter: Payer: Self-pay | Admitting: *Deleted

## 2013-12-22 DIAGNOSIS — R55 Syncope and collapse: Secondary | ICD-10-CM

## 2013-12-22 DIAGNOSIS — I480 Paroxysmal atrial fibrillation: Secondary | ICD-10-CM

## 2013-12-22 NOTE — Progress Notes (Signed)
Patient ID: Steve [name], male   DOB: 07-Sep-1940, 73 y.o.   MRN: [id number] Verite 30 day cardiac event monitor applied to patient.

## 2013-12-23 ENCOUNTER — Telehealth: Payer: Self-pay | Admitting: Nutrition

## 2013-12-23 NOTE — Telephone Encounter (Signed)
Patient was identified at risk for malnutrition on the MST secondary to weight loss and poor appetite. Contacted patient.  He was unavailable. Left name and phone number for patient to return call if desired.  **Disclaimer: This note was dictated with voice recognition software. Similar sounding words can inadvertently be transcribed and this note may contain transcription errors which may not have been corrected upon publication of note.**

## 2013-12-25 ENCOUNTER — Ambulatory Visit (INDEPENDENT_AMBULATORY_CARE_PROVIDER_SITE_OTHER): Payer: Medicare HMO | Admitting: Internal Medicine

## 2013-12-25 ENCOUNTER — Telehealth: Payer: Self-pay | Admitting: *Deleted

## 2013-12-25 ENCOUNTER — Other Ambulatory Visit: Payer: Self-pay | Admitting: Internal Medicine

## 2013-12-25 ENCOUNTER — Encounter: Payer: Self-pay | Admitting: Internal Medicine

## 2013-12-25 VITALS — BP 110/80 | HR 109 | Ht 72.0 in | Wt 180.0 lb

## 2013-12-25 DIAGNOSIS — I471 Supraventricular tachycardia: Secondary | ICD-10-CM

## 2013-12-25 MED ORDER — FLECAINIDE ACETATE 50 MG PO TABS
75.0000 mg | ORAL_TABLET | Freq: Two times a day (BID) | ORAL | Status: DC
Start: 2013-12-25 — End: 2013-12-25

## 2013-12-25 NOTE — Patient Instructions (Signed)
Your physician has recommended you make the following change in your medication:  1.) START FLECAINIDE 75 MG TWICE DAILY  Your physician recommends that you schedule a follow-up appointment ON 12/30/13 FOR EKG and BLOOD PRESSURE CHECK with the nurse.

## 2013-12-25 NOTE — Telephone Encounter (Signed)
Received call from [employer], patient wearing event monitor in sustained svt 250 bpm He is at a store and has reported to [employer] that he has been dizzy and passed out 3 times this morning. He was on the phone with a [employer] representative when I was talking to a rep also.  I adv to tell the patient to call EMS and go to nearest ED. [employer] emailing and faxing the rhythm strips.  Called patient on his mobile #.  He was sitting in a chair at [employer]. Thinks the fast rhythm broke.  Not feeling it.  Not dizzy. He has tried to reach his wife to come to take him to the hospital.  I urged him to call EMS to take him but he will wait for his wife to call back and then come get him.  Informed Dr. Harrington Challenger.  Received printout of the rhythm. After talking with patient she feels the svt broke, advised patient to come to office for appointment. Added patient to today's schedule.

## 2013-12-25 NOTE — Progress Notes (Signed)
HPI Patient is a 73 yo who I saw fro the first time in Jan  He has a history of intermitt atrial fib. When I saw him I recomm a holter  This showed SR/SB and afib  Average HR was 90   Echo was also done that was normal  He was not felt to be a candidate for coumadin with oncologic problems I saw the patinet on Mon with [name]  He presented to clinic for eval of 4 syncopal spells  He was orthostatic on exam He was set up with an event monitor and placed on low dose midodrine Today he called while shopping at [employer] very weak, dizzy  Felt heart racing [facility] called  He was in SVT at 250 bpm   Told to call 911, go to ER  [facility] came  Going to take to ER but broke  Once HR slowed he felt better He currently feels OK  No dizziness  No CP  No SOB    Allergies  Allergen Reactions  . Morphine And Related Hives    Current Outpatient Prescriptions  Medication Sig Dispense Refill  . aspirin 81 MG tablet Take 81 mg by mouth daily.    . DULoxetine (CYMBALTA) 60 MG capsule Take 1 capsule (60 mg total) by mouth daily. (Patient taking differently: Take 60 mg by mouth every morning. ) 30 capsule 3  . gabapentin (NEURONTIN) 300 MG capsule TAKE 1 CAPSULE BY MOUTH THREE TIMES DAILY 270 capsule 0  . midodrine (PROAMATINE) 2.5 MG tablet Take 1 tablet (2.5 mg total) by mouth 2 (two) times daily with a meal. 60 tablet 5  . simvastatin (ZOCOR) 20 MG tablet Take one tablet by mouth once daily to lower cholesterol 90 tablet 3  . zolpidem (AMBIEN) 10 MG tablet Take one tablet by mouth once daily at bedtime for rest 90 tablet 0  . oxyCODONE-acetaminophen (ROXICET) 5-325 MG per tablet Take 1 tablet by mouth every 6 (six) hours as needed for severe pain. (Patient not taking: Reported on 12/25/2013) 120 tablet 0   No current facility-administered medications for this visit.    Past Medical History  Diagnosis Date  . Dizziness and giddiness     positional vertigo  . Hyperlipidemia   . Impotence of  organic origin   . Osteoarthritis, shoulder   . Allergy   . Hypothyroidism     pt denies thryoid disease, dr pandey note 11-19-12 says subclinical hypothryoid in epic  . COPD (chronic obstructive pulmonary disease)   . Insomnia   . Malignant neoplasm of bladder, part unspecified   . Malignant neoplasm of bronchus and lung, unspecified site   . Hx of radiation therapy 02/2013    right lung  . Diabetes mellitus without complication     pt denies diabetes, dr pandey note says dm 11-19-12 epic  . 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 Cystoscopy w/ dilation of bladder    . Tonsillectomy  age 89  . Endobronchial ultrasound Bilateral 12/15/2012    Procedure: ENDOBRONCHIAL ULTRASOUND;  Surgeon: Brand Males, MD;  Location: [facility];  Service: Cardiopulmonary;  Laterality: Bilateral;  . Colonoscopy  2009    [facility]    Family History  Problem Relation Age of Onset  . Kidney disease Brother     History   Social History  . Marital Status: Married    Spouse Name:  N/A    Number of Children: N/A  . Years of Education: N/A   Occupational History  . business owner    Social History Main Topics  . Smoking status: Former Smoker -- 1.50 packs/day for 50 years    Types: Cigarettes    Quit date: 01/09/2007  . Smokeless tobacco: Current User    Types: Chew  . Alcohol Use: Yes     Comment: 4-5 beers a month, 1 mixed drink every 2 months  . Drug Use: No  . Sexual Activity: Not on file   Other Topics Concern  . Not on file   Social History Narrative    Review of Systems:  All systems reviewed.  They are negative to the above problem except as previously stated.  Vital Signs: BP 110/80 mmHg  Pulse 109  Ht 6' (1.829 m)  Wt 180 lb (81.647 kg)  BMI 24.41 kg/m2  Physical Exam Patient is in NAD HEENT:  Normocephalic, atraumatic. EOMI, PERRLA.  Neck: JVP is normal.  No bruits.  Lungs: clear to  auscultation. No rales no wheezes.  Heart: Irregular rate and rhythm. Normal S1, S2. No S3.   No significant murmurs. PMI not displaced.  Abdomen:  Supple, nontender. Normal bowel sounds. No masses. No hepatomegaly.  Extremities:   Good distal pulses throughout. No lower extremity edema.  Musculoskeletal :moving all extremities.  Neuro:   alert and oriented x3.  CN II-XII grossly intact.  EKG  1/13:  Atrial fibrillation  132 bpm  Nonspecific ST T wave changes    Assessment and Plan:  1.  SVT  Patient had signif dizziness but did not pass out.  I have reviewed case with Beckie Salts.  Would recomm, given other medical issues, a trial fo flecanide  Start at 75 bid  Given his low BP and being on midodrine I do not think he would tolerate b blocker Rx  Know risk of tachycarrhythmias  Do not want him to pass out.  1.  Afib  Keep on current regimen  3.  Onc  Follows with infusion  patinet will need to come in next week for BP and EKG  Call if symptoms worsen

## 2013-12-29 ENCOUNTER — Ambulatory Visit: Payer: Medicare HMO

## 2013-12-29 ENCOUNTER — Encounter: Payer: Self-pay | Admitting: Physician Assistant

## 2013-12-29 ENCOUNTER — Ambulatory Visit (HOSPITAL_BASED_OUTPATIENT_CLINIC_OR_DEPARTMENT_OTHER): Payer: Medicare HMO | Admitting: Physician Assistant

## 2013-12-29 ENCOUNTER — Encounter (HOSPITAL_BASED_OUTPATIENT_CLINIC_OR_DEPARTMENT_OTHER): Payer: Medicare HMO | Admitting: Physician Assistant

## 2013-12-29 ENCOUNTER — Telehealth: Payer: Self-pay | Admitting: Physician Assistant

## 2013-12-29 ENCOUNTER — Ambulatory Visit (HOSPITAL_BASED_OUTPATIENT_CLINIC_OR_DEPARTMENT_OTHER): Payer: Medicare HMO

## 2013-12-29 VITALS — BP 150/80 | HR 109 | Temp 98.0°F | Resp 18 | Ht 72.0 in | Wt 179.6 lb

## 2013-12-29 DIAGNOSIS — Z95828 Presence of other vascular implants and grafts: Secondary | ICD-10-CM

## 2013-12-29 DIAGNOSIS — R42 Dizziness and giddiness: Secondary | ICD-10-CM

## 2013-12-29 DIAGNOSIS — C3431 Malignant neoplasm of lower lobe, right bronchus or lung: Secondary | ICD-10-CM

## 2013-12-29 DIAGNOSIS — G629 Polyneuropathy, unspecified: Secondary | ICD-10-CM

## 2013-12-29 DIAGNOSIS — I4891 Unspecified atrial fibrillation: Secondary | ICD-10-CM

## 2013-12-29 DIAGNOSIS — Z5112 Encounter for antineoplastic immunotherapy: Secondary | ICD-10-CM

## 2013-12-29 LAB — COMPREHENSIVE METABOLIC PANEL (CC13)
ALK PHOS: 94 U/L (ref 40–150)
ALT: 27 U/L (ref 0–55)
AST: 23 U/L (ref 5–34)
Albumin: 3.3 g/dL — ABNORMAL LOW (ref 3.5–5.0)
Anion Gap: 12 mEq/L — ABNORMAL HIGH (ref 3–11)
BILIRUBIN TOTAL: 0.37 mg/dL (ref 0.20–1.20)
BUN: 14 mg/dL (ref 7.0–26.0)
CO2: 28 mEq/L (ref 22–29)
Calcium: 9.7 mg/dL (ref 8.4–10.4)
Chloride: 99 mEq/L (ref 98–109)
Creatinine: 0.8 mg/dL (ref 0.7–1.3)
EGFR: 87 mL/min/{1.73_m2} — AB (ref >90)
GLUCOSE: 148 mg/dL — AB (ref 70–140)
Potassium: 4.1 mEq/L (ref 3.5–5.1)
Sodium: 139 mEq/L (ref 136–145)
Total Protein: 7.5 g/dL (ref 6.4–8.3)

## 2013-12-29 LAB — CBC WITH DIFFERENTIAL/PLATELET
BASO%: 0.5 % (ref 0.0–2.0)
BASOS ABS: 0.1 10*3/uL (ref 0.0–0.1)
EOS%: 1.2 % (ref 0.0–7.0)
Eosinophils Absolute: 0.1 10*3/uL (ref 0.0–0.5)
HCT: 36.5 % — ABNORMAL LOW (ref 38.4–49.9)
HEMOGLOBIN: 11.5 g/dL — AB (ref 13.0–17.1)
LYMPH%: 9.2 % — AB (ref 14.0–49.0)
MCH: 25.9 pg — ABNORMAL LOW (ref 27.2–33.4)
MCHC: 31.4 g/dL — ABNORMAL LOW (ref 32.0–36.0)
MCV: 82.6 fL (ref 79.3–98.0)
MONO#: 1 10*3/uL — ABNORMAL HIGH (ref 0.1–0.9)
MONO%: 9.6 % (ref 0.0–14.0)
NEUT%: 79.5 % — ABNORMAL HIGH (ref 39.0–75.0)
NEUTROS ABS: 8.2 10*3/uL — AB (ref 1.5–6.5)
PLATELETS: 536 10*3/uL — AB (ref 140–400)
RBC: 4.42 10*6/uL (ref 4.20–5.82)
RDW: 17.4 % — ABNORMAL HIGH (ref 11.0–14.6)
WBC: 10.3 10*3/uL (ref 4.0–10.3)
lymph#: 0.9 10*3/uL (ref 0.9–3.3)

## 2013-12-29 MED ORDER — SODIUM CHLORIDE 0.9 % IV SOLN
Freq: Once | INTRAVENOUS | Status: AC
  Administered 2013-12-29: 11:00:00 via INTRAVENOUS

## 2013-12-29 MED ORDER — SODIUM CHLORIDE 0.9 % IV SOLN
3.0000 mg/kg | Freq: Once | INTRAVENOUS | Status: AC
  Administered 2013-12-29: 260 mg via INTRAVENOUS
  Filled 2013-12-29: qty 10

## 2013-12-29 MED ORDER — HEPARIN SOD (PORK) LOCK FLUSH 100 UNIT/ML IV SOLN
500.0000 [IU] | Freq: Once | INTRAVENOUS | Status: AC | PRN
  Administered 2013-12-29: 500 [IU]
  Filled 2013-12-29: qty 5

## 2013-12-29 MED ORDER — SODIUM CHLORIDE 0.9 % IJ SOLN
10.0000 mL | INTRAMUSCULAR | Status: DC | PRN
  Administered 2013-12-29: 10 mL
  Filled 2013-12-29: qty 10

## 2013-12-29 MED ORDER — SODIUM CHLORIDE 0.9 % IJ SOLN
10.0000 mL | INTRAMUSCULAR | Status: DC | PRN
  Administered 2013-12-29: 10 mL via INTRAVENOUS
  Filled 2013-12-29: qty 10

## 2013-12-29 NOTE — Telephone Encounter (Signed)
Gave avs & cal for Jan. Sent mess to adjust tx to follow appt.

## 2013-12-29 NOTE — Patient Instructions (Signed)
Follow-up with your cardiologist as scheduled Continue labs and immunotherapy as scheduled Follow-up in 2 weeks

## 2013-12-29 NOTE — Patient Instructions (Signed)
[facility] Discharge Instructions for Patients Receiving Chemotherapy  Today you received the following chemotherapy agents Nivolumab  To help prevent nausea and vomiting after your treatment, we encourage you to take your nausea medication as directed/prescribed    If you develop nausea and vomiting that is not controlled by your nausea medication, call the clinic.   BELOW ARE SYMPTOMS THAT SHOULD BE REPORTED IMMEDIATELY:  *FEVER GREATER THAN 100.5 F  *CHILLS WITH OR WITHOUT FEVER  NAUSEA AND VOMITING THAT IS NOT CONTROLLED WITH YOUR NAUSEA MEDICATION  *UNUSUAL SHORTNESS OF BREATH  *UNUSUAL BRUISING OR BLEEDING  TENDERNESS IN MOUTH AND THROAT WITH OR WITHOUT PRESENCE OF ULCERS  *URINARY PROBLEMS  *BOWEL PROBLEMS  UNUSUAL RASH Items with * indicate a potential emergency and should be followed up as soon as possible.  Feel free to call the clinic you have any questions or concerns. The clinic phone number is [phone].

## 2013-12-29 NOTE — Patient Instructions (Signed)

## 2013-12-29 NOTE — Progress Notes (Addendum)
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   Primary site: Lung (Right)  Staging method: AJCC 7th Edition  Clinical: Stage IIIA (T2b, N2, M0)  Summary: Stage IIIA (T2b, N2, M0)   PRIOR THERAPY:  1) Concurrent chemoradiation with weekly carboplatin for an AUC of 2 and paclitaxel 45 mg/m2, status post 4 cycles. 2) Consolidation chemotherapy with carboplatin for AUC of 5 and paclitaxel 175 mg/M2 every 3 weeks with Neulasta support, first cycle on 03/23/2013. Status post 3 cycles.  CURRENT THERAPY: Immunotherapy with Nivolumab 3 mg/KG every 2 weeks. First dose 09/21/2013. Status post 6 cycles.  DISEASE STAGE:  Lung cancer  Primary site: Lung (Right)  Staging method: AJCC 7th Edition  Clinical: Stage IIIA (T2b, N2, M0)  Summary: Stage IIIA (T2b, N2, M0)  CHEMOTHERAPY INTENT: Control/Palliative  CURRENT # OF CHEMOTHERAPY CYCLES: 7 CURRENT ANTIEMETICS: Zofran, dexamethasone, Compazine  CURRENT SMOKING STATUS: Former smoker, quit 01/09/2007  ORAL CHEMOTHERAPY AND CONSENT: n/a  CURRENT BISPHOSPHONATES USE: none  PAIN MANAGEMENT: Ibuprofen  NARCOTICS INDUCED CONSTIPATION: None  LIVING WILL AND CODE STATUS: ?  INTERVAL HISTORY: RAKWON LETOURNEAU 73 y.o. male returns to the clinic today for followup visit accompanied by his wife. He is now status post 6 cycles of  treatment with Nivolumab and is tolerating it fairly well.  He denied having any significant chest pain but continues to have shortness of breath with exertion with mild cough without hemoptysis He denied having any weight loss or night sweats. The patient has no fever or chills and no nausea or  vomiting. He continues to have peripheral neuropathy especially in the toes which feels cold more than numbness and tingling. He was started on treatment with Cymbalta and tolerating it well. He reports since last being seen in our office that he had an episode of an arrhythmia where his heart rate was greater than 250 bpm. He has had 4 episodes of syncope with one resulted in a laceration to the back of his scalp that required 7 staples. Stable status since been removed in the areas hearing healing well. He's been reevaluated by his cardiologist, Dr. Dorris Carnes, and he has a follow-up with her on 12/30/2013. He is currently wearing a heart rate monitor. Per his report he is orthostatic when he stands up. She has placed him on flecainide and midodrine. He otherwise feeling fine. He is here today to start cycle #7 of his immunotherapy. He did have a MRI on 12/16/2013 of his brain to evaluate his dizziness and it was negative for brain metastasis or acute process.   MEDICAL HISTORY: Past Medical History  Diagnosis Date  . Dizziness and giddiness     positional vertigo  . Hyperlipidemia   . Impotence of organic origin   . Osteoarthritis, shoulder   . Allergy   . Hypothyroidism     pt denies thryoid disease, dr pandey note 11-19-12 says subclinical hypothryoid in epic  . COPD (chronic obstructive pulmonary disease)   . Insomnia   . Malignant neoplasm of bladder, part unspecified   . Malignant neoplasm of bronchus and lung, unspecified site   . Hx of radiation therapy 02/2013    right lung  .  Diabetes mellitus without complication     pt denies diabetes, dr pandey note says dm 11-19-12 epic  . 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  MEDICATIONS:  Current Outpatient Prescriptions  Medication Sig Dispense Refill  . aspirin 81 MG tablet Take 81 mg by mouth daily.    . DULoxetine (CYMBALTA) 60 MG capsule Take 1 capsule (60 mg  total) by mouth daily. (Patient taking differently: Take 60 mg by mouth every morning. ) 30 capsule 3  . flecainide (TAMBOCOR) 50 MG tablet TAKE 1 AND 1/2 TABLETS BY MOUTH TWICE DAILY 270 tablet 1  . gabapentin (NEURONTIN) 300 MG capsule TAKE 1 CAPSULE BY MOUTH THREE TIMES DAILY 270 capsule 0  . midodrine (PROAMATINE) 2.5 MG tablet Take 1 tablet (2.5 mg total) by mouth 2 (two) times daily with a meal. 60 tablet 5  . oxyCODONE-acetaminophen (ROXICET) 5-325 MG per tablet Take 1 tablet by mouth every 6 (six) hours as needed for severe pain. 120 tablet 0  . simvastatin (ZOCOR) 20 MG tablet Take one tablet by mouth once daily to lower cholesterol 90 tablet 3  . zolpidem (AMBIEN) 10 MG tablet Take one tablet by mouth once daily at bedtime for rest 90 tablet 0   No current facility-administered medications for this visit.   Facility-Administered Medications Ordered in Other Visits  Medication Dose Route Frequency Provider Last Rate Last Dose  . 0.9 %  sodium chloride infusion   Intravenous Once Curt Bears, MD      . heparin lock flush 100 unit/mL  500 Units Intracatheter Once PRN Curt Bears, MD      . sodium chloride 0.9 % injection 10 mL  10 mL Intracatheter PRN Curt Bears, MD        SURGICAL HISTORY:  Past Surgical History  Procedure Laterality Date  . Cystoscopy w/ dilation of bladder    . Tonsillectomy  age 20  . Endobronchial ultrasound Bilateral 12/15/2012    Procedure: ENDOBRONCHIAL ULTRASOUND;  Surgeon: Brand Males, MD;  Location: [facility];  Service: Cardiopulmonary;  Laterality: Bilateral;  . Colonoscopy  2009    [facility]    REVIEW OF SYSTEMS:  Constitutional: negative Eyes: negative Ears, nose, mouth, throat, and face: negative Respiratory: positive for cough, dyspnea on exertion and wheezing Cardiovascular: positive for palpitations, syncope and paroxysmal tachycardia Gastrointestinal: negative Genitourinary:negative Integument/breast:  negative Hematologic/lymphatic: negative Musculoskeletal:negative Neurological: positive for dizziness and paresthesia Behavioral/Psych: negative Endocrine: negative Allergic/Immunologic: negative   PHYSICAL EXAMINATION: General appearance: alert, cooperative and no distress Head: Normocephalic, without obvious abnormality, atraumatic Neck: no adenopathy, no JVD, supple, symmetrical, trachea midline and thyroid not enlarged, symmetric, no tenderness/mass/nodules Lymph nodes: Cervical, supraclavicular, and axillary nodes normal. Resp: clear to auscultation bilaterally Back: symmetric, no curvature. ROM normal. No CVA tenderness. Cardio: regular rate and rhythm, S1, S2 normal, no murmur, click, rub or gallop GI: soft, non-tender; bowel sounds normal; no masses,  no organomegaly Extremities: extremities normal, atraumatic, no cyanosis or edema Neurologic: Alert and oriented X 3, normal strength and tone. Normal symmetric reflexes. Normal coordination and gait  ECOG PERFORMANCE STATUS: 1 - Symptomatic but completely ambulatory  Blood pressure 150/80, pulse 109, temperature 98 F (36.7 C), temperature source Oral, resp. rate 18, height 6' (1.829 m), weight 179 lb 9.6 oz (81.466 kg), SpO2 97 %.  LABORATORY DATA: Lab Results  Component Value Date   WBC 10.3 12/29/2013   HGB 11.5* 12/29/2013  HCT 36.5* 12/29/2013   MCV 82.6 12/29/2013   PLT 536* 12/29/2013      Chemistry      Component Value Date/Time   NA 139 12/29/2013 0930   NA 139 11/16/2013 0755   NA 142 04/28/2013 0819   K 4.1 12/29/2013 0930   K 4.1 11/16/2013 0755   CL 100 11/16/2013 0755   CO2 28 12/29/2013 0930   CO2 23 11/16/2013 0755   BUN 14.0 12/29/2013 0930   BUN 12 11/16/2013 0755   BUN 12 04/28/2013 0819   CREATININE 0.8 12/29/2013 0930   CREATININE 0.73 11/16/2013 0755      Component Value Date/Time   CALCIUM 9.7 12/29/2013 0930   CALCIUM 10.0 11/16/2013 0755   ALKPHOS 94 12/29/2013 0930   ALKPHOS 107  04/28/2013 0819   AST 23 12/29/2013 0930   AST 19 04/28/2013 0819   ALT 27 12/29/2013 0930   ALT 12 04/28/2013 0819   BILITOT 0.37 12/29/2013 0930   BILITOT 0.4 04/28/2013 0819       RADIOGRAPHIC STUDIES:  ASSESSMENT AND PLAN: This is a very pleasant 73 years old [place] male with:   1) metastatic non-small cell lung cancer initially diagnosed as stage IIIA non-small cell lung cancer who completed a course of concurrent chemoradiation with weekly carboplatin and paclitaxel with no significant complaints except for radiation induced odynophagia.  He also completed 3 cycles of consolidation chemotherapy with reduced dose carboplatin and paclitaxel. He is currently undergoing immunotherapy with Nivolumab status post  6 cycles. He is tolerating this treatment fairly well. He will proceed with cycle #7 today as a scheduled.  2) peripheral neuropathy: The patient will continue his current treatment with Neurontin but will decrease the dose to 300 mg by mouth 3 times a day in addition to Cymbalta and oxycodone as needed.   3) dizzy spells: He had MRI of the brain on 11/03/2013 that was negative for brain metastasis or acute process. His dizziness seems to be of a cardiac nature. He is to follow-up with Dr. Dorris Carnes, his cardiologist as scheduled on 12/30/2013.   Patient was discussed with and also seen by Dr. Julien Nordmann. He'll follow-up in 2 weeks prior to the start of cycle #8.   He was advised to call immediately if he has any concerning symptoms in the interval.  The patient voices understanding of current disease status and treatment options and is in agreement with the current care plan.  All questions were answered. The patient knows to call the clinic with any problems, questions or concerns. We can certainly see the patient much sooner if necessary.  Carlton Adam, PA-C 12/29/2013  ADDENDUM: Hematology/Oncology Attending: I had a face to face encounter with the patient. I  recommended his care plan. This is a very pleasant 73 years old [place] male with metastatic non-small cell lung cancer, undergoing immunotherapy with Nivolumab status post 6 cycles. The patient is tolerating his treatment with Nivolumab fairly well with no significant complaints. Has recent episodes of dizzy spells and syncopal episode with laceration of the scalp after referral. He was found to have atrial fibrillation with heart rate up to 250. He is currently followed by Dr. Harrington Challenger for management of his condition. I recommended for the patient to continue his current treatment with Nivolumab as a scheduled. He would come back for follow-up visit in 2 weeks for reevaluation. He will continue his current treatment with Neurontin and Cymbalta for the peripheral neuropathy. The patient was advised to  call immediately if he has any concerning symptoms in the interval.   Disclaimer: This note was dictated with voice recognition software. Similar sounding words can inadvertently be transcribed and may not be corrected upon review. Eilleen Kempf., MD 01/02/2014

## 2013-12-30 ENCOUNTER — Other Ambulatory Visit: Payer: Self-pay | Admitting: Internal Medicine

## 2013-12-30 ENCOUNTER — Ambulatory Visit (INDEPENDENT_AMBULATORY_CARE_PROVIDER_SITE_OTHER): Payer: Medicare HMO | Admitting: *Deleted

## 2013-12-30 VITALS — BP 122/80 | HR 104 | Resp 20 | Wt 179.0 lb

## 2013-12-30 DIAGNOSIS — I480 Paroxysmal atrial fibrillation: Secondary | ICD-10-CM

## 2013-12-30 DIAGNOSIS — R55 Syncope and collapse: Secondary | ICD-10-CM

## 2013-12-30 DIAGNOSIS — C3431 Malignant neoplasm of lower lobe, right bronchus or lung: Secondary | ICD-10-CM

## 2013-12-30 MED ORDER — METOPROLOL TARTRATE 25 MG PO TABS: 25.0000 mg | ORAL_TABLET | Freq: Two times a day (BID) | ORAL | Status: DC

## 2013-12-30 NOTE — Progress Notes (Signed)
1.) Reason for visit: EKG/BP check per Dr Harrington Challenger- pt had an episode of SVT with a rate of 205 on 12/18 and Dr Harrington Challenger started the pt on flecainide 75 mg po bid and ordered for him to come in for a EKG/BP check for 12/30/13  2.) Name of MD requesting visit: Dr. Harrington Challenger  3.) H&P: SVT, AFIB, Cancer, hyperlipidemia  4.) ROS related to problem: Pt reports to the office for nurse visit per Dr Harrington Challenger for EKG/BP check. Pt had an episode on 12/18 as indicated by his 30 day event monitor of SVT with a rate of 205. Dr Harrington Challenger notified immediately and saw the pt in the office same day. Per Dr Harrington Challenger the pt was started on Flecainide 75 mg po bid and to return for a nurse visit on 12/30/13 and call Dr Harrington Challenger with pts current VS and EKG interpretation. Pts current VS today were- 122/80, HR- 104 R-20 O2 SATS 98 % RA. Pts EKG showed sinus tach at a rate of 104. Pt denies cp, increased sob and doe, LEE, pre-syncopal/ syncopal episodes, dizziness, or any other cardiac complaints at this time. Pt states "I feel much better since Dr Harrington Challenger started me on that new med Flecainide, for I have had no acute episodes of fast HR or feeling dizzy." Contacted Dr Harrington Challenger via cell phone to inform her of pts current condition. Per Dr Harrington Challenger she would like the pt to start taking Metoprolol tartrate 25 mg po bid- take 1/2 tablet tonight and start full dose tomorrow with take 25 mg bid, schedule the pt for a GXT for middle of next week, and inform the pt that she will be calling him next week. Made pt aware of new orders per Dr Harrington Challenger, and advised him that in the meantime if he has any acute episodes b/t now and next week, he is to contact our office immediately or report to the ER.  Confirmed the pharmacy of choice with the pt. Pt verbalized understanding of instructions given with read back, and agree with this plan.  5.) Assessment and plan per MD: Per Dr Harrington Challenger the pt is to start taking Metoprolol tartrate 25 mg po bid- for tonight 12/23 he should take 1/2 dose and  then resume the full dose of 25 mg bid on 12/24. Also per Dr Harrington Challenger the pt should be scheduled for a GXT for mid next week and she will call him as well. Pt advised on when to contact the office immediately and when to report to the ED.   Showed Dr Rayann Heman the pts EKG and QRS duration and per Dr Rayann Heman continue with Dr Harrington Challenger current plan and duration in WNL.

## 2013-12-30 NOTE — Patient Instructions (Signed)
Your physician has recommended you make the following change in your medication:   START TAKING METOPROLOL TARTRATE 25 MG TWICE DAILY-  PER DR ROSS YOU SHOULD TAKE 1/2 DOSE TONIGHT 12/23, THEN RESUME TAKING THE FULL DOSE ON 12-31-13 OF 25 MG TWICE DAILY.    Your physician has requested that you have an exercise tolerance test. For further information please visit [email or web address]. Please also follow instruction sheet, as given.  PLEASE HAVE THIS SCHEDULED FOR NEXT WEEK PREFERABLY MIDDLE OF THE WEEK PER DR ROSS.     PER DR ROSS SHE WILL BE CALLING YOU NEXT WEEK TO GET A REPORT ON YOUR CURRENT CONDITION     PLEASE CALL OUR OFFICE OR REPORT TO THE ER WITH ANY ACUTE EPISODES FOR FAST HR, SHORTNESS OF BREATH, DIZZINESS, OR FEELING FAINT.

## 2014-01-04 ENCOUNTER — Other Ambulatory Visit: Payer: Self-pay | Admitting: Physician Assistant

## 2014-01-06 ENCOUNTER — Encounter (HOSPITAL_COMMUNITY): Payer: Medicare HMO

## 2014-01-12 ENCOUNTER — Other Ambulatory Visit (HOSPITAL_BASED_OUTPATIENT_CLINIC_OR_DEPARTMENT_OTHER): Payer: Medicare HMO

## 2014-01-12 ENCOUNTER — Ambulatory Visit (HOSPITAL_BASED_OUTPATIENT_CLINIC_OR_DEPARTMENT_OTHER): Payer: Medicare HMO | Admitting: Physician Assistant

## 2014-01-12 ENCOUNTER — Telehealth: Payer: Self-pay | Admitting: Internal Medicine

## 2014-01-12 ENCOUNTER — Encounter: Payer: Self-pay | Admitting: Physician Assistant

## 2014-01-12 ENCOUNTER — Ambulatory Visit: Payer: Medicare HMO

## 2014-01-12 ENCOUNTER — Other Ambulatory Visit: Payer: Medicare HMO

## 2014-01-12 ENCOUNTER — Ambulatory Visit (HOSPITAL_BASED_OUTPATIENT_CLINIC_OR_DEPARTMENT_OTHER): Payer: Medicare HMO

## 2014-01-12 VITALS — BP 121/64 | HR 81 | Temp 97.6°F | Resp 18 | Ht 72.0 in | Wt 177.8 lb

## 2014-01-12 DIAGNOSIS — R5383 Other fatigue: Secondary | ICD-10-CM

## 2014-01-12 DIAGNOSIS — Z79899 Other long term (current) drug therapy: Secondary | ICD-10-CM | POA: Diagnosis not present

## 2014-01-12 DIAGNOSIS — C3431 Malignant neoplasm of lower lobe, right bronchus or lung: Secondary | ICD-10-CM

## 2014-01-12 DIAGNOSIS — Z5112 Encounter for antineoplastic immunotherapy: Secondary | ICD-10-CM

## 2014-01-12 DIAGNOSIS — Z95828 Presence of other vascular implants and grafts: Secondary | ICD-10-CM

## 2014-01-12 LAB — COMPREHENSIVE METABOLIC PANEL (CC13)
ALT: 24 U/L (ref 0–55)
AST: 24 U/L (ref 5–34)
Albumin: 3.1 g/dL — ABNORMAL LOW (ref 3.5–5.0)
Alkaline Phosphatase: 87 U/L (ref 40–150)
Anion Gap: 9 mEq/L (ref 3–11)
BUN: 20.1 mg/dL (ref 7.0–26.0)
CALCIUM: 9.5 mg/dL (ref 8.4–10.4)
CHLORIDE: 103 meq/L (ref 98–109)
CO2: 27 mEq/L (ref 22–29)
CREATININE: 0.8 mg/dL (ref 0.7–1.3)
EGFR: 87 mL/min/{1.73_m2} — ABNORMAL LOW (ref >90)
Glucose: 122 mg/dl (ref 70–140)
POTASSIUM: 4.2 meq/L (ref 3.5–5.1)
Sodium: 138 mEq/L (ref 136–145)
Total Bilirubin: 0.33 mg/dL (ref 0.20–1.20)
Total Protein: 7.6 g/dL (ref 6.4–8.3)

## 2014-01-12 LAB — CBC WITH DIFFERENTIAL/PLATELET
BASO%: 0.8 % (ref 0.0–2.0)
BASOS ABS: 0.1 10*3/uL (ref 0.0–0.1)
EOS ABS: 0.2 10*3/uL (ref 0.0–0.5)
EOS%: 1.7 % (ref 0.0–7.0)
HCT: 37.3 % — ABNORMAL LOW (ref 38.4–49.9)
HGB: 11.7 g/dL — ABNORMAL LOW (ref 13.0–17.1)
LYMPH%: 13.2 % — ABNORMAL LOW (ref 14.0–49.0)
MCH: 25.8 pg — ABNORMAL LOW (ref 27.2–33.4)
MCHC: 31.3 g/dL — ABNORMAL LOW (ref 32.0–36.0)
MCV: 82.6 fL (ref 79.3–98.0)
MONO#: 0.8 10*3/uL (ref 0.1–0.9)
MONO%: 8.6 % (ref 0.0–14.0)
NEUT#: 7.2 10*3/uL — ABNORMAL HIGH (ref 1.5–6.5)
NEUT%: 75.7 % — ABNORMAL HIGH (ref 39.0–75.0)
PLATELETS: 601 10*3/uL — AB (ref 140–400)
RBC: 4.52 10*6/uL (ref 4.20–5.82)
RDW: 17.2 % — ABNORMAL HIGH (ref 11.0–14.6)
WBC: 9.5 10*3/uL (ref 4.0–10.3)
lymph#: 1.3 10*3/uL (ref 0.9–3.3)

## 2014-01-12 LAB — TSH CHCC: TSH: 2.062 m[IU]/L (ref 0.320–4.118)

## 2014-01-12 MED ORDER — HEPARIN SOD (PORK) LOCK FLUSH 100 UNIT/ML IV SOLN
500.0000 [IU] | Freq: Once | INTRAVENOUS | Status: AC | PRN
  Administered 2014-01-12: 500 [IU]
  Filled 2014-01-12: qty 5

## 2014-01-12 MED ORDER — SODIUM CHLORIDE 0.9 % IV SOLN
3.2000 mg/kg | Freq: Once | INTRAVENOUS | Status: AC
  Administered 2014-01-12: 260 mg via INTRAVENOUS
  Filled 2014-01-12: qty 10

## 2014-01-12 MED ORDER — SODIUM CHLORIDE 0.9 % IJ SOLN
10.0000 mL | INTRAMUSCULAR | Status: DC | PRN
  Administered 2014-01-12: 10 mL
  Filled 2014-01-12: qty 10

## 2014-01-12 MED ORDER — SODIUM CHLORIDE 0.9 % IJ SOLN
10.0000 mL | INTRAMUSCULAR | Status: DC | PRN
  Administered 2014-01-12: 10 mL via INTRAVENOUS
  Filled 2014-01-12: qty 10

## 2014-01-12 MED ORDER — SODIUM CHLORIDE 0.9 % IV SOLN
Freq: Once | INTRAVENOUS | Status: AC
  Administered 2014-01-12: 13:00:00 via INTRAVENOUS

## 2014-01-12 MED ORDER — SODIUM CHLORIDE 0.9 % IV SOLN
3.0000 mg/kg | Freq: Once | INTRAVENOUS | Status: DC
  Filled 2014-01-12: qty 24

## 2014-01-12 NOTE — Patient Instructions (Signed)
[facility] Discharge Instructions for Patients Receiving Chemotherapy  Today you received the following chemotherapy agents: Nivolumab. To help prevent nausea and vomiting after your treatment, we encourage you to take your nausea medication:  As directed.   If you develop nausea and vomiting that is not controlled by your nausea medication, call the clinic.   BELOW ARE SYMPTOMS THAT SHOULD BE REPORTED IMMEDIATELY:  *FEVER GREATER THAN 100.5 F  *CHILLS WITH OR WITHOUT FEVER  NAUSEA AND VOMITING THAT IS NOT CONTROLLED WITH YOUR NAUSEA MEDICATION  *UNUSUAL SHORTNESS OF BREATH  *UNUSUAL BRUISING OR BLEEDING  TENDERNESS IN MOUTH AND THROAT WITH OR WITHOUT PRESENCE OF ULCERS  *URINARY PROBLEMS  *BOWEL PROBLEMS  UNUSUAL RASH Items with * indicate a potential emergency and should be followed up as soon as possible.  Feel free to call the clinic you have any questions or concerns. The clinic phone number is [phone].

## 2014-01-12 NOTE — Telephone Encounter (Signed)
gv and printed appt sched and avs for pt for Jan adn Feb 2016..sed added tx.

## 2014-01-12 NOTE — Patient Instructions (Signed)

## 2014-01-12 NOTE — Patient Instructions (Signed)
Continue labs and immunotherapy as scheduled Follow-up in 2 weeks with a restaging CT scan of the chest to reevaluate your disease prior to your next scheduled cycle of immunotherapy

## 2014-01-12 NOTE — Progress Notes (Addendum)
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   Primary site: Lung (Right)  Staging method: AJCC 7th Edition  Clinical: Stage IIIA (T2b, N2, M0)  Summary: Stage IIIA (T2b, N2, M0)   PRIOR THERAPY:  1) Concurrent chemoradiation with weekly carboplatin for an AUC of 2 and paclitaxel 45 mg/m2, status post 4 cycles. 2) Consolidation chemotherapy with carboplatin for AUC of 5 and paclitaxel 175 mg/M2 every 3 weeks with Neulasta support, first cycle on 03/23/2013. Status post 3 cycles.  CURRENT THERAPY: Immunotherapy with Nivolumab 3 mg/KG every 2 weeks. First dose 09/21/2013. Status post 7 cycles.  DISEASE STAGE:  Lung cancer  Primary site: Lung (Right)  Staging method: AJCC 7th Edition  Clinical: Stage IIIA (T2b, N2, M0)  Summary: Stage IIIA (T2b, N2, M0)  CHEMOTHERAPY INTENT: Control/Palliative  CURRENT # OF CHEMOTHERAPY CYCLES: 8 CURRENT ANTIEMETICS: Zofran, dexamethasone, Compazine  CURRENT SMOKING STATUS: Former smoker, quit 01/09/2007  ORAL CHEMOTHERAPY AND CONSENT: n/a  CURRENT BISPHOSPHONATES USE: none  PAIN MANAGEMENT: Ibuprofen  NARCOTICS INDUCED CONSTIPATION: None  LIVING WILL AND CODE STATUS: ?  INTERVAL HISTORY: Steve [name] 74 y.o. male returns to the clinic today for followup visit. He is now status post 7 cycles of  treatment with Nivolumab and is tolerating it fairly well.  He denied having any significant chest pain but continues to have shortness of breath with exertion with mild cough without hemoptysis He denied having any weight loss or night sweats. The patient has no fever or chills and no nausea or vomiting. He continues to have  peripheral neuropathy especially in the toes which feels cold more than numbness and tingling. He is currently taking Cymbalta for his peripheral neuropathy symptoms and is tolerating this medication well. He continues to wear an event monitor and has an additional 2 weeks before this monitoring will be taken off. He will follow-up with his cardiologist, Dr. Dorris Carnes as scheduled.  He is here today to start cycle #8 of his immunotherapy.   MEDICAL HISTORY: Past Medical History  Diagnosis Date  . Dizziness and giddiness     positional vertigo  . Hyperlipidemia   . Impotence of organic origin   . Osteoarthritis, shoulder   . Allergy   . Hypothyroidism     pt denies thryoid disease, dr pandey note 11-19-12 says subclinical hypothryoid in epic  . COPD (chronic obstructive pulmonary disease)   . Insomnia   . Malignant neoplasm of bladder, part unspecified   . Malignant neoplasm of bronchus and lung, unspecified site   . Hx of radiation therapy 02/2013    right lung  . Diabetes mellitus without complication     pt denies diabetes, dr pandey note says dm 11-19-12 epic  . 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  MEDICATIONS:  Current Outpatient Prescriptions  Medication Sig Dispense Refill  . aspirin 81 MG tablet Take 81 mg by mouth daily.    . DULoxetine (CYMBALTA) 60 MG capsule Take 1 capsule (60 mg total) by mouth daily. (Patient taking differently: Take 60 mg by mouth every morning. ) 30 capsule 3  . flecainide (  TAMBOCOR) 50 MG tablet TAKE 1 AND 1/2 TABLETS BY MOUTH TWICE DAILY 270 tablet 1  . gabapentin (NEURONTIN) 300 MG capsule TAKE 1 CAPSULE BY MOUTH THREE TIMES DAILY 270 capsule 0  . metoprolol tartrate (LOPRESSOR) 25 MG tablet Take 1 tablet (25 mg total) by mouth 2 (two) times daily. 90 tablet 3  . midodrine (PROAMATINE) 2.5 MG tablet Take 1 tablet (2.5 mg total) by mouth 2 (two) times daily with a meal. 60 tablet  5  . oxyCODONE-acetaminophen (ROXICET) 5-325 MG per tablet Take 1 tablet by mouth every 6 (six) hours as needed for severe pain. 120 tablet 0  . simvastatin (ZOCOR) 20 MG tablet Take one tablet by mouth once daily to lower cholesterol 90 tablet 3  . zolpidem (AMBIEN) 10 MG tablet Take one tablet by mouth once daily at bedtime for rest 90 tablet 0   No current facility-administered medications for this visit.   Facility-Administered Medications Ordered in Other Visits  Medication Dose Route Frequency Provider Last Rate Last Dose  . 0.9 %  sodium chloride infusion   Intravenous Once Curt Bears, MD      . heparin lock flush 100 unit/mL  500 Units Intracatheter Once PRN Curt Bears, MD      . nivolumab (OPDIVO) 240 mg in sodium chloride 0.9 % 100 mL chemo infusion  3 mg/kg (Treatment Plan Ideal) Intravenous Once Curt Bears, MD      . sodium chloride 0.9 % injection 10 mL  10 mL Intracatheter PRN Curt Bears, MD        SURGICAL HISTORY:  Past Surgical History  Procedure Laterality Date  . Cystoscopy w/ dilation of bladder    . Tonsillectomy  age 50  . Endobronchial ultrasound Bilateral 12/15/2012    Procedure: ENDOBRONCHIAL ULTRASOUND;  Surgeon: Brand Males, MD;  Location: [facility];  Service: Cardiopulmonary;  Laterality: Bilateral;  . Colonoscopy  2009    [facility]    REVIEW OF SYSTEMS:  Constitutional: negative Eyes: negative Ears, nose, mouth, throat, and face: negative Respiratory: positive for cough, dyspnea on exertion and wheezing Cardiovascular: positive for palpitations, syncope and paroxysmal tachycardia Gastrointestinal: negative Genitourinary:negative Integument/breast: negative Hematologic/lymphatic: negative Musculoskeletal:negative Neurological: positive for dizziness and paresthesia Behavioral/Psych: negative Endocrine: negative Allergic/Immunologic: negative   PHYSICAL EXAMINATION: General appearance: alert, cooperative and no  distress Head: Normocephalic, without obvious abnormality, atraumatic Neck: no adenopathy, no JVD, supple, symmetrical, trachea midline and thyroid not enlarged, symmetric, no tenderness/mass/nodules Lymph nodes: Cervical, supraclavicular, and axillary nodes normal. Resp: clear to auscultation bilaterally Back: symmetric, no curvature. ROM normal. No CVA tenderness. Cardio: regular rate and rhythm, S1, S2 normal, no murmur, click, rub or gallop GI: soft, non-tender; bowel sounds normal; no masses,  no organomegaly Extremities: extremities normal, atraumatic, no cyanosis or edema Neurologic: Alert and oriented X 3, normal strength and tone. Normal symmetric reflexes. Normal coordination and gait  ECOG PERFORMANCE STATUS: 1 - Symptomatic but completely ambulatory  Blood pressure 121/64, pulse 81, temperature 97.6 F (36.4 C), temperature source Oral, resp. rate 18, height 6' (1.829 m), weight 177 lb 12.8 oz (80.65 kg), SpO2 96 %.  LABORATORY DATA: Lab Results  Component Value Date   WBC 9.5 01/12/2014   HGB 11.7* 01/12/2014   HCT 37.3* 01/12/2014   MCV 82.6 01/12/2014   PLT 601* 01/12/2014      Chemistry      Component Value Date/Time   NA 138 01/12/2014 1125   NA 139 11/16/2013 0755   NA 142 04/28/2013 0819  K 4.2 01/12/2014 1125   K 4.1 11/16/2013 0755   CL 100 11/16/2013 0755   CO2 27 01/12/2014 1125   CO2 23 11/16/2013 0755   BUN 20.1 01/12/2014 1125   BUN 12 11/16/2013 0755   BUN 12 04/28/2013 0819   CREATININE 0.8 01/12/2014 1125   CREATININE 0.73 11/16/2013 0755      Component Value Date/Time   CALCIUM 9.5 01/12/2014 1125   CALCIUM 10.0 11/16/2013 0755   ALKPHOS 87 01/12/2014 1125   ALKPHOS 107 04/28/2013 0819   AST 24 01/12/2014 1125   AST 19 04/28/2013 0819   ALT 24 01/12/2014 1125   ALT 12 04/28/2013 0819   BILITOT 0.33 01/12/2014 1125   BILITOT 0.4 04/28/2013 0819       RADIOGRAPHIC STUDIES:  ASSESSMENT AND PLAN: This is a very pleasant 74 years  old [place] male with:   1) metastatic non-small cell lung cancer initially diagnosed as stage IIIA non-small cell lung cancer who completed a course of concurrent chemoradiation with weekly carboplatin and paclitaxel with no significant complaints except for radiation induced odynophagia.  He also completed 3 cycles of consolidation chemotherapy with reduced dose carboplatin and paclitaxel. He is currently undergoing immunotherapy with Nivolumab status post  7 cycles. He is tolerating this treatment fairly well. He will proceed with cycle #8 today as a scheduled.  2) peripheral neuropathy: The patient will continue his current treatment with Neurontin but will decrease the dose to 300 mg by mouth 3 times a day in addition to Cymbalta and oxycodone as needed.   3) dizzy spells: He had MRI of the brain on 11/03/2013 that was negative for brain metastasis or acute process. His dizziness seems to be of a cardiac nature. He is currently wearing an event monitor and is to follow-up with Dr. Dorris Carnes, his cardiologist as scheduled.   Patient was discussed with and also seen by Dr. Julien Nordmann. He'll follow-up in 2 weeks prior to the start of cycle #9 with a restaging CT scan of his chest with contrast to reevaluate his disease.   He was advised to call immediately if he has any concerning symptoms in the interval.  The patient voices understanding of current disease status and treatment options and is in agreement with the current care plan.  All questions were answered. The patient knows to call the clinic with any problems, questions or concerns. We can certainly see the patient much sooner if necessary.  Carlton Adam, PA-C 01/12/2014  ADDENDUM: Hematology/Oncology Attending: I had a face to face encounter with the patient. I recommended his care plan. This is a very pleasant 74 years old [place] male with metastatic non-small cell lung cancer, poorly differentiated squamous cell carcinoma. He is  currently undergoing immunotherapy with Nivolumab status post 7 cycles and tolerating his treatment fairly well. The patient feels well this days and denied having any significant complaints except for shortness breath with exertion. I recommended for him to proceed with cycle #8 today as scheduled. He would come back for follow-up visit in 2 weeks after repeating CT scan of the chest for restaging of his disease. He was advised to call immediately if he has any concerning symptoms in the interval.  Disclaimer: This note was dictated with voice recognition software. Similar sounding words can inadvertently be transcribed and may be missed upon review. Eilleen Kempf., MD 01/12/2014

## 2014-01-14 ENCOUNTER — Ambulatory Visit (HOSPITAL_COMMUNITY)
Admission: RE | Admit: 2014-01-14 | Discharge: 2014-01-14 | Disposition: A | Discharge status: Home / Self Care | Payer: Medicare HMO | Source: Ambulatory Visit | Attending: Internal Medicine | Admitting: Internal Medicine

## 2014-01-14 ENCOUNTER — Other Ambulatory Visit: Payer: Self-pay

## 2014-01-14 DIAGNOSIS — C3431 Malignant neoplasm of lower lobe, right bronchus or lung: Secondary | ICD-10-CM

## 2014-01-14 DIAGNOSIS — R55 Syncope and collapse: Secondary | ICD-10-CM

## 2014-01-14 DIAGNOSIS — R0789 Other chest pain: Secondary | ICD-10-CM | POA: Diagnosis not present

## 2014-01-14 DIAGNOSIS — I480 Paroxysmal atrial fibrillation: Secondary | ICD-10-CM

## 2014-01-14 NOTE — Progress Notes (Signed)
12/30/2013 1. Reason for visit: EKG/BP check per Dr Harrington Challenger- pt had an episode of SVT with a rate of 205 on 12/18 and Dr Harrington Challenger started the pt on flecainide 75 mg po bid and ordered for him to come in for a EKG/BP check for 12/30/13  2. Name of MD requesting visit: Dr. Harrington Challenger  3. H&P: SVT, AFIB, Cancer, hyperlipidemia  4. ROS related to problem: Pt reports to the office for nurse visit per Dr Harrington Challenger for EKG/BP check. Pt had an episode on 12/18 as indicated by his 30 day event monitor of SVT with a rate of 205. Dr Harrington Challenger notified immediately and saw the pt in the office same day. Per Dr Harrington Challenger the pt was started on Flecainide 75 mg po bid and to return for a nurse visit on 12/30/13 and call Dr Harrington Challenger with pts current VS and EKG interpretation. Pts current VS today were- 122/80, HR- 104 R-20 O2 SATS 98 % RA. Pts EKG showed sinus tach at a rate of 104. Pt denies cp, increased sob and doe, LEE, pre-syncopal/ syncopal episodes, dizziness, or any other cardiac complaints at this time. Pt states "I feel much better since Dr Harrington Challenger started me on that new med Flecainide, for I have had no acute episodes of fast HR or feeling dizzy." Contacted Dr Harrington Challenger via cell phone to inform her of pts current condition. Per Dr Harrington Challenger she would like the pt to start taking Metoprolol tartrate 25 mg po bid- take 1/2 tablet tonight and start full dose tomorrow with take 25 mg bid, schedule the pt for a GXT for middle of next week, and inform the pt that she will be calling him next week. Made pt aware of new orders per Dr Harrington Challenger, and advised him that in the meantime if he has any acute episodes b/t now and next week, he is to contact our office immediately or report to the ER. Confirmed the pharmacy of choice with the pt. Pt verbalized understanding of instructions given with read back, and agree with this plan.  5. Assessment and plan per MD: Per Dr Harrington Challenger the pt is to start taking Metoprolol tartrate 25 mg po bid- for tonight 12/23 he should take  1/2 dose and then resume the full dose of 25 mg bid on 12/24. Also per Dr Harrington Challenger the pt should be scheduled for a GXT for mid next week and she will call him as well. Pt advised on when to contact the office immediately and when to report to the ED.  Showed Dr Rayann Heman the pts EKG and QRS duration and per Dr Rayann Heman continue with Dr Harrington Challenger current plan and duration in WNL.    01/07:  Pt here for GXT. Questions answered to his satisfaction. Has taken Flecainide this am, but no metoprolol.  Target HR reached early in stage 2, exercise continued 30 sec more. SOB limiting factor. No arrhythmia seen.  Rosaria Ferries, PA-C 01/14/2014 11:00 AM Beeper [phone]

## 2014-01-18 ENCOUNTER — Telehealth: Payer: Self-pay

## 2014-01-18 ENCOUNTER — Other Ambulatory Visit: Payer: Self-pay | Admitting: Physician Assistant

## 2014-01-18 DIAGNOSIS — C3431 Malignant neoplasm of lower lobe, right bronchus or lung: Secondary | ICD-10-CM

## 2014-01-18 NOTE — Telephone Encounter (Signed)
Radiology called, need new order for pt CT scan for Jan 15th. New order entered by Awilda Metro, PA.

## 2014-01-21 ENCOUNTER — Other Ambulatory Visit: Payer: Self-pay | Admitting: *Deleted

## 2014-01-21 DIAGNOSIS — G894 Chronic pain syndrome: Secondary | ICD-10-CM

## 2014-01-21 MED ORDER — OXYCODONE-ACETAMINOPHEN 5-325 MG PO TABS: 1.0000 | ORAL_TABLET | Freq: Four times a day (QID) | ORAL | Status: DC | PRN

## 2014-01-21 NOTE — Telephone Encounter (Signed)
Patient Requested and will pick up 

## 2014-01-21 NOTE — Progress Notes (Signed)
HPI Patient is a 74 yo who  has a history of intermitt atrial fib. When I saw him I recomm a holter  This showed SR/SB and afib  Average HR was 90   Echo was also done that was normal  He was not felt to be a candidate for coumadin with oncologic problems Earlier this fall I saw him with [name]  He had 4 syncopal spells  He was orhtostatic on exam.  Placed on low dose midodrin  Set up for event monitor   Monitor company called  He was in SVT at 250 bpm  Broke spontaneously    I placed him on Flecanide  He felt better afrter this   GXT done on 1/7  No arrhythmia     Allergies  Allergen Reactions  . Morphine And Related Hives    Current Outpatient Prescriptions  Medication Sig Dispense Refill  . aspirin 81 MG tablet Take 81 mg by mouth daily.    . DULoxetine (CYMBALTA) 60 MG capsule Take 1 capsule (60 mg total) by mouth daily. (Patient taking differently: Take 60 mg by mouth every morning. ) 30 capsule 3  . flecainide (TAMBOCOR) 150 MG tablet Take 0.5 tablets (75 mg total) by mouth 2 (two) times daily. 90 tablet 1  . gabapentin (NEURONTIN) 300 MG capsule TAKE 1 CAPSULE BY MOUTH THREE TIMES DAILY 270 capsule 0  . metoprolol tartrate (LOPRESSOR) 25 MG tablet Take 1 tablet (25 mg total) by mouth 2 (two) times daily. 90 tablet 1  . midodrine (PROAMATINE) 2.5 MG tablet Take 1 tablet (2.5 mg total) by mouth daily. 90 tablet 5  . oxyCODONE-acetaminophen (ROXICET) 5-325 MG per tablet Take 1 tablet by mouth every 6 (six) hours as needed for severe pain. 120 tablet 0  . simvastatin (ZOCOR) 20 MG tablet Take one tablet by mouth once daily to lower cholesterol 90 tablet 3  . zolpidem (AMBIEN) 10 MG tablet Take one tablet by mouth once daily at bedtime for rest 90 tablet 0   No current facility-administered medications for this visit.    Past Medical History  Diagnosis Date  . Dizziness and giddiness     positional vertigo  . Hyperlipidemia   . Impotence of organic origin   . Osteoarthritis,  shoulder   . Allergy   . Hypothyroidism     pt denies thryoid disease, dr pandey note 11-19-12 says subclinical hypothryoid in epic  . COPD (chronic obstructive pulmonary disease)   . Insomnia   . Hx of radiation therapy 02/2013    right lung  . Diabetes mellitus without complication     pt denies diabetes, dr pandey note says dm 11-19-12 epic  . 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 Malignant neoplasm of bladder, part unspecified   . Malignant neoplasm of bronchus and lung, unspecified site     Past Surgical History  Procedure Laterality Date  . Cystoscopy w/ dilation of bladder    . Tonsillectomy  age 39  . Endobronchial ultrasound Bilateral 12/15/2012    Procedure: ENDOBRONCHIAL ULTRASOUND;  Surgeon: Brand Males, MD;  Location: [facility];  Service: Cardiopulmonary;  Laterality: Bilateral;  . Colonoscopy  2009    [facility]    Family History  Problem Relation Age of Onset  . Kidney disease Brother     History   Social History  . Marital Status: Married    Spouse Name: N/A    Number of Children: N/A  . Years  of Education: N/A   Occupational History  . business owner    Social History Main Topics  . Smoking status: Former Smoker -- 1.50 packs/day for 50 years    Types: Cigarettes    Quit date: 01/09/2007  . Smokeless tobacco: Current User    Types: Chew  . Alcohol Use: Yes     Comment: 4-5 beers a month, 1 mixed drink every 2 months  . Drug Use: No  . Sexual Activity: Not on file   Other Topics Concern  . Not on file   Social History Narrative    Review of Systems:  All systems reviewed.  They are negative to the above problem except as previously stated.  Vital Signs: BP 108/60 mmHg  Pulse 73  Ht 6' 0.5" (1.842 m)  Wt 176 lb 1.9 oz (79.888 kg)  BMI 23.55 kg/m2  Physical Exam Patient is in NAD HEENT:  Normocephalic, atraumatic. EOMI, PERRLA.  Neck: JVP is normal.  No bruits.  Lungs: Decreased BS Rlung No rales no wheezes.   Heart: Irregular rate and rhythm. Normal S1, S2. No S3.   No significant murmurs. PMI not displaced.  Abdomen:  Supple, nontender. Normal bowel sounds. No masses. No hepatomegaly.  Extremities:   Good distal pulses throughout. No lower extremity edema.  Musculoskeletal :moving all extremities.  Neuro:   alert and oriented x3.  CN II-XII grossly intact.   Assessment and Plan:  1  SVT  Patient now on flecanide  No further spells  Would keep on current regimen  1.  Afib paroxysmal  Keep on current regimen  3.  Onc  Follows with infusion  4  Dizziness  Patinet with occasional mild episodes  Would bacd down on midodrine to 2.5 1x per day  If no symptoms then stop and follow

## 2014-01-22 ENCOUNTER — Ambulatory Visit (INDEPENDENT_AMBULATORY_CARE_PROVIDER_SITE_OTHER): Payer: Medicare HMO | Admitting: Internal Medicine

## 2014-01-22 ENCOUNTER — Telehealth: Payer: Self-pay | Admitting: *Deleted

## 2014-01-22 ENCOUNTER — Encounter (HOSPITAL_COMMUNITY): Payer: Self-pay

## 2014-01-22 ENCOUNTER — Encounter: Payer: Self-pay | Admitting: Internal Medicine

## 2014-01-22 ENCOUNTER — Ambulatory Visit (HOSPITAL_COMMUNITY)
Admission: RE | Admit: 2014-01-22 | Discharge: 2014-01-22 | Disposition: A | Discharge status: Home / Self Care | Payer: Medicare HMO | Source: Ambulatory Visit | Attending: Physician Assistant | Admitting: Physician Assistant

## 2014-01-22 VITALS — BP 108/60 | HR 73 | Ht 72.5 in | Wt 176.1 lb

## 2014-01-22 DIAGNOSIS — R55 Syncope and collapse: Secondary | ICD-10-CM

## 2014-01-22 DIAGNOSIS — Z923 Personal history of irradiation: Secondary | ICD-10-CM | POA: Diagnosis not present

## 2014-01-22 DIAGNOSIS — C3431 Malignant neoplasm of lower lobe, right bronchus or lung: Secondary | ICD-10-CM | POA: Insufficient documentation

## 2014-01-22 DIAGNOSIS — I480 Paroxysmal atrial fibrillation: Secondary | ICD-10-CM

## 2014-01-22 DIAGNOSIS — Z79899 Other long term (current) drug therapy: Secondary | ICD-10-CM | POA: Diagnosis not present

## 2014-01-22 MED ORDER — MIDODRINE HCL 2.5 MG PO TABS: 2.5000 mg | ORAL_TABLET | Freq: Every day | ORAL | Status: DC

## 2014-01-22 MED ORDER — FLECAINIDE ACETATE 150 MG PO TABS: 75.0000 mg | ORAL_TABLET | Freq: Two times a day (BID) | ORAL | Status: DC

## 2014-01-22 MED ORDER — IOHEXOL 300 MG/ML  SOLN
80.0000 mL | Freq: Once | INTRAMUSCULAR | Status: AC | PRN
  Administered 2014-01-22: 80 mL via INTRAVENOUS

## 2014-01-22 MED ORDER — METOPROLOL TARTRATE 25 MG PO TABS: 25.0000 mg | ORAL_TABLET | Freq: Two times a day (BID) | ORAL | Status: DC

## 2014-01-22 NOTE — Telephone Encounter (Signed)
Received call from radiology regarding the CT of the chest showing questionable infxn.  Showed results to Dr Benay Spice in Dr [facility] absence, per Dr Benay Spice, if pt has no s/s of infectious process, okay to wait to be seen 1/19 at f/u.  Spoke to pt.  Pt denies any acute changes or s/s fever/ infection.  Instructed pt to go to the ED if he develops any new symptoms.  He verbalized understanding.

## 2014-01-22 NOTE — Patient Instructions (Addendum)
**Note De-Identified Steve [name] Obfuscation** Your physician has recommended you make the following change in your medication: decrease Midodrine 2.5 mg to once daily and increase Flecainide to 75 mg twice daily  Your physician wants you to follow-up in: at the end of April. You will receive a reminder letter in the mail two months in advance. If you don't receive a letter, please call our office to schedule the follow-up appointment.

## 2014-01-25 ENCOUNTER — Telehealth: Payer: Self-pay | Admitting: *Deleted

## 2014-01-25 NOTE — Telephone Encounter (Signed)
End of service report for patient's event monitor reviewed by Dr. Harrington Challenger. "SR + SVT (180-250 bpm), pt contacted already"  Pt was seen in office by Dr. Harrington Challenger 01/22/14.

## 2014-01-26 ENCOUNTER — Ambulatory Visit: Payer: Medicare HMO

## 2014-01-26 ENCOUNTER — Encounter: Payer: Self-pay | Admitting: Physician Assistant

## 2014-01-26 ENCOUNTER — Ambulatory Visit (HOSPITAL_BASED_OUTPATIENT_CLINIC_OR_DEPARTMENT_OTHER): Payer: Medicare HMO

## 2014-01-26 ENCOUNTER — Other Ambulatory Visit: Payer: Medicare HMO

## 2014-01-26 ENCOUNTER — Telehealth: Payer: Self-pay | Admitting: Internal Medicine

## 2014-01-26 ENCOUNTER — Other Ambulatory Visit (HOSPITAL_BASED_OUTPATIENT_CLINIC_OR_DEPARTMENT_OTHER): Payer: Medicare HMO

## 2014-01-26 ENCOUNTER — Ambulatory Visit (HOSPITAL_BASED_OUTPATIENT_CLINIC_OR_DEPARTMENT_OTHER): Payer: Medicare HMO | Admitting: Physician Assistant

## 2014-01-26 VITALS — BP 137/77 | HR 79 | Temp 97.5°F | Resp 18 | Ht 72.5 in | Wt 176.3 lb

## 2014-01-26 DIAGNOSIS — C3431 Malignant neoplasm of lower lobe, right bronchus or lung: Secondary | ICD-10-CM

## 2014-01-26 DIAGNOSIS — Z5112 Encounter for antineoplastic immunotherapy: Secondary | ICD-10-CM

## 2014-01-26 DIAGNOSIS — R42 Dizziness and giddiness: Secondary | ICD-10-CM

## 2014-01-26 DIAGNOSIS — G629 Polyneuropathy, unspecified: Secondary | ICD-10-CM

## 2014-01-26 DIAGNOSIS — Z95828 Presence of other vascular implants and grafts: Secondary | ICD-10-CM

## 2014-01-26 LAB — COMPREHENSIVE METABOLIC PANEL (CC13)
ALBUMIN: 3.4 g/dL — AB (ref 3.5–5.0)
ALT: 30 U/L (ref 0–55)
AST: 31 U/L (ref 5–34)
Alkaline Phosphatase: 93 U/L (ref 40–150)
Anion Gap: 13 mEq/L — ABNORMAL HIGH (ref 3–11)
BUN: 21.1 mg/dL (ref 7.0–26.0)
CO2: 24 mEq/L (ref 22–29)
Calcium: 9.4 mg/dL (ref 8.4–10.4)
Chloride: 103 mEq/L (ref 98–109)
Creatinine: 0.8 mg/dL (ref 0.7–1.3)
EGFR: 87 mL/min/{1.73_m2} — ABNORMAL LOW (ref >90)
GLUCOSE: 91 mg/dL (ref 70–140)
Potassium: 4.2 mEq/L (ref 3.5–5.1)
SODIUM: 140 meq/L (ref 136–145)
Total Bilirubin: 0.29 mg/dL (ref 0.20–1.20)
Total Protein: 8 g/dL (ref 6.4–8.3)

## 2014-01-26 LAB — CBC WITH DIFFERENTIAL/PLATELET
BASO%: 0.3 % (ref 0.0–2.0)
BASOS ABS: 0 10*3/uL (ref 0.0–0.1)
EOS%: 2.5 % (ref 0.0–7.0)
Eosinophils Absolute: 0.3 10*3/uL (ref 0.0–0.5)
HCT: 37.5 % — ABNORMAL LOW (ref 38.4–49.9)
HEMOGLOBIN: 11.9 g/dL — AB (ref 13.0–17.1)
LYMPH%: 14.7 % (ref 14.0–49.0)
MCH: 26.4 pg — ABNORMAL LOW (ref 27.2–33.4)
MCHC: 31.7 g/dL — ABNORMAL LOW (ref 32.0–36.0)
MCV: 83.3 fL (ref 79.3–98.0)
MONO#: 1.5 10*3/uL — AB (ref 0.1–0.9)
MONO%: 13.4 % (ref 0.0–14.0)
NEUT%: 69.1 % (ref 39.0–75.0)
NEUTROS ABS: 7.8 10*3/uL — AB (ref 1.5–6.5)
Platelets: 529 10*3/uL — ABNORMAL HIGH (ref 140–400)
RBC: 4.5 10*6/uL (ref 4.20–5.82)
RDW: 17 % — ABNORMAL HIGH (ref 11.0–14.6)
WBC: 11.2 10*3/uL — ABNORMAL HIGH (ref 4.0–10.3)
lymph#: 1.7 10*3/uL (ref 0.9–3.3)

## 2014-01-26 MED ORDER — SODIUM CHLORIDE 0.9 % IV SOLN
Freq: Once | INTRAVENOUS | Status: AC
  Administered 2014-01-26: 15:00:00 via INTRAVENOUS

## 2014-01-26 MED ORDER — SODIUM CHLORIDE 0.9 % IV SOLN
2.8000 mg/kg | Freq: Once | INTRAVENOUS | Status: AC
  Administered 2014-01-26: 240 mg via INTRAVENOUS
  Filled 2014-01-26: qty 24

## 2014-01-26 MED ORDER — SODIUM CHLORIDE 0.9 % IJ SOLN
10.0000 mL | INTRAMUSCULAR | Status: DC | PRN
  Administered 2014-01-26: 10 mL
  Filled 2014-01-26: qty 10

## 2014-01-26 MED ORDER — SODIUM CHLORIDE 0.9 % IJ SOLN
10.0000 mL | INTRAMUSCULAR | Status: DC | PRN
  Administered 2014-01-26: 10 mL via INTRAVENOUS
  Filled 2014-01-26: qty 10

## 2014-01-26 MED ORDER — HEPARIN SOD (PORK) LOCK FLUSH 100 UNIT/ML IV SOLN
500.0000 [IU] | Freq: Once | INTRAVENOUS | Status: AC | PRN
  Administered 2014-01-26: 500 [IU]
  Filled 2014-01-26: qty 5

## 2014-01-26 NOTE — Patient Instructions (Signed)

## 2014-01-26 NOTE — Telephone Encounter (Signed)
s.w pt and advosed on Pt sched 1.21 @ 4:15pm with Dr. Chase Caller...Marland Kitchenhe will get sched from nurse in tx room

## 2014-01-26 NOTE — Addendum Note (Signed)
Addended by: Curt Bears on: 01/26/2014 10:41 PM   Modules accepted: Level of Service

## 2014-01-26 NOTE — Patient Instructions (Signed)
[facility] Discharge Instructions for Patients Receiving Chemotherapy  Today you received the following chemotherapy agents nivolumab   To help prevent nausea and vomiting after your treatment, we encourage you to take your nausea medication as directed   If you develop nausea and vomiting that is not controlled by your nausea medication, call the clinic.   BELOW ARE SYMPTOMS THAT SHOULD BE REPORTED IMMEDIATELY:  *FEVER GREATER THAN 100.5 F  *CHILLS WITH OR WITHOUT FEVER  NAUSEA AND VOMITING THAT IS NOT CONTROLLED WITH YOUR NAUSEA MEDICATION  *UNUSUAL SHORTNESS OF BREATH  *UNUSUAL BRUISING OR BLEEDING  TENDERNESS IN MOUTH AND THROAT WITH OR WITHOUT PRESENCE OF ULCERS  *URINARY PROBLEMS  *BOWEL PROBLEMS  UNUSUAL RASH Items with * indicate a potential emergency and should be followed up as soon as possible.  Feel free to call the clinic you have any questions or concerns. The clinic phone number is [phone].  

## 2014-01-26 NOTE — Progress Notes (Addendum)
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   Primary site: Lung (Right)  Staging method: AJCC 7th Edition  Clinical: Stage IIIA (T2b, N2, M0)  Summary: Stage IIIA (T2b, N2, M0)   PRIOR THERAPY:  1) Concurrent chemoradiation with weekly carboplatin for an AUC of 2 and paclitaxel 45 mg/m2, status post 4 cycles. 2) Consolidation chemotherapy with carboplatin for AUC of 5 and paclitaxel 175 mg/M2 every 3 weeks with Neulasta support, first cycle on 03/23/2013. Status post 3 cycles.  CURRENT THERAPY: Immunotherapy with Nivolumab 3 mg/KG every 2 weeks. First dose 09/21/2013. Status post 8 cycles.  DISEASE STAGE:  Lung cancer  Primary site: Lung (Right)  Staging method: AJCC 7th Edition  Clinical: Stage IIIA (T2b, N2, M0)  Summary: Stage IIIA (T2b, N2, M0)  CHEMOTHERAPY INTENT: Control/Palliative  CURRENT # OF CHEMOTHERAPY CYCLES: 9 CURRENT ANTIEMETICS: Zofran, dexamethasone, Compazine  CURRENT SMOKING STATUS: Former smoker, quit 01/09/2007  ORAL CHEMOTHERAPY AND CONSENT: n/a  CURRENT BISPHOSPHONATES USE: none  PAIN MANAGEMENT: Ibuprofen  NARCOTICS INDUCED CONSTIPATION: None  LIVING WILL AND CODE STATUS: ?  INTERVAL HISTORY: Steve [name] 74 y.o. male returns to the clinic today for followup visit accompanied by his wife. He is now status post 8 cycles of  treatment with Nivolumab and is tolerating it fairly well.  He denied having any significant chest pain but continues to have shortness of breath with exertion with mild cough without hemoptysis He denied having any weight loss or night sweats. The patient has no fever or chills and no nausea or  vomiting. He continues to have peripheral neuropathy especially in the toes which feels cold more than numbness and tingling. He is currently taking Cymbalta for his peripheral neuropathy symptoms and is tolerating this medication well. He reports overall he feels that his shortness of breath is slightly better. He is able to walk longer distances before becoming more short of breath. He is here today to start cycle #9 of his immunotherapy. He recently had a restaging CT scan of his chest and presents to discuss the results.  MEDICAL HISTORY: Past Medical History  Diagnosis Date  . Dizziness and giddiness     positional vertigo  . Hyperlipidemia   . Impotence of organic origin   . Osteoarthritis, shoulder   . Allergy   . Hypothyroidism     pt denies thryoid disease, dr pandey note 11-19-12 says subclinical hypothryoid in epic  . COPD (chronic obstructive pulmonary disease)   . Insomnia   . Hx of radiation therapy 02/2013    right lung  . Diabetes mellitus without complication     pt denies diabetes, dr pandey note says dm 11-19-12 epic  . 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 Malignant neoplasm of bladder, part unspecified   . Malignant neoplasm of bronchus and lung, unspecified site     ALLERGIES:  is allergic to morphine and related.  MEDICATIONS:  Current Outpatient Prescriptions  Medication Sig Dispense Refill  . aspirin 81 MG tablet Take 81 mg by mouth daily.    . DULoxetine (CYMBALTA) 60 MG capsule Take 1 capsule (60 mg total) by mouth daily. (Patient taking differently: Take  60 mg by mouth every morning. ) 30 capsule 3  . flecainide (TAMBOCOR) 150 MG tablet Take 0.5 tablets (75 mg total) by mouth 2 (two) times daily. 90 tablet 1  . gabapentin (NEURONTIN) 300 MG capsule TAKE 1 CAPSULE BY MOUTH THREE TIMES DAILY 270 capsule 0  . metoprolol tartrate (LOPRESSOR) 25 MG tablet Take 1 tablet (25 mg total) by mouth 2 (two) times daily. 90 tablet 1  . midodrine  (PROAMATINE) 2.5 MG tablet Take 1 tablet (2.5 mg total) by mouth daily. 90 tablet 5  . oxyCODONE-acetaminophen (ROXICET) 5-325 MG per tablet Take 1 tablet by mouth every 6 (six) hours as needed for severe pain. 120 tablet 0  . simvastatin (ZOCOR) 20 MG tablet Take one tablet by mouth once daily to lower cholesterol 90 tablet 3  . zolpidem (AMBIEN) 10 MG tablet Take one tablet by mouth once daily at bedtime for rest 90 tablet 0   No current facility-administered medications for this visit.   Facility-Administered Medications Ordered in Other Visits  Medication Dose Route Frequency Provider Last Rate Last Dose  . sodium chloride 0.9 % injection 10 mL  10 mL Intracatheter PRN Curt Bears, MD   10 mL at 01/26/14 1646    SURGICAL HISTORY:  Past Surgical History  Procedure Laterality Date  . Cystoscopy w/ dilation of bladder    . Tonsillectomy  age 79  . Endobronchial ultrasound Bilateral 12/15/2012    Procedure: ENDOBRONCHIAL ULTRASOUND;  Surgeon: Brand Males, MD;  Location: [facility];  Service: Cardiopulmonary;  Laterality: Bilateral;  . Colonoscopy  2009    [facility]    REVIEW OF SYSTEMS:  Constitutional: negative Eyes: negative Ears, nose, mouth, throat, and face: negative Respiratory: positive for cough, dyspnea on exertion and wheezing Cardiovascular: positive for palpitations, syncope and paroxysmal tachycardia Gastrointestinal: negative Genitourinary:negative Integument/breast: negative Hematologic/lymphatic: negative Musculoskeletal:negative Neurological: positive for dizziness and paresthesia Behavioral/Psych: negative Endocrine: negative Allergic/Immunologic: negative   PHYSICAL EXAMINATION: General appearance: alert, cooperative and no distress Head: Normocephalic, without obvious abnormality, atraumatic Neck: no adenopathy, no JVD, supple, symmetrical, trachea midline and thyroid not enlarged, symmetric, no tenderness/mass/nodules Lymph nodes: Cervical,  supraclavicular, and axillary nodes normal. Resp: clear to auscultation bilaterally Back: symmetric, no curvature. ROM normal. No CVA tenderness. Cardio: regular rate and rhythm, S1, S2 normal, no murmur, click, rub or gallop GI: soft, non-tender; bowel sounds normal; no masses,  no organomegaly Extremities: extremities normal, atraumatic, no cyanosis or edema Neurologic: Alert and oriented X 3, normal strength and tone. Normal symmetric reflexes. Normal coordination and gait  ECOG PERFORMANCE STATUS: 1 - Symptomatic but completely ambulatory  Blood pressure 137/77, pulse 79, temperature 97.5 F (36.4 C), temperature source Oral, resp. rate 18, height 6' 0.5" (1.842 m), weight 176 lb 4.8 oz (79.969 kg).  LABORATORY DATA: Lab Results  Component Value Date   WBC 11.2* 01/26/2014   HGB 11.9* 01/26/2014   HCT 37.5* 01/26/2014   MCV 83.3 01/26/2014   PLT 529* 01/26/2014      Chemistry      Component Value Date/Time   NA 140 01/26/2014 1346   NA 139 11/16/2013 0755   NA 142 04/28/2013 0819   K 4.2 01/26/2014 1346   K 4.1 11/16/2013 0755   CL 100 11/16/2013 0755   CO2 24 01/26/2014 1346   CO2 23 11/16/2013 0755   BUN 21.1 01/26/2014 1346   BUN 12 11/16/2013 0755   BUN 12 04/28/2013 0819   CREATININE 0.8 01/26/2014 1346   CREATININE 0.73 11/16/2013  6045      Component Value Date/Time   CALCIUM 9.4 01/26/2014 1346   CALCIUM 10.0 11/16/2013 0755   ALKPHOS 93 01/26/2014 1346   ALKPHOS 107 04/28/2013 0819   AST 31 01/26/2014 1346   AST 19 04/28/2013 0819   ALT 30 01/26/2014 1346   ALT 12 04/28/2013 0819   BILITOT 0.29 01/26/2014 1346   BILITOT 0.4 04/28/2013 0819       RADIOGRAPHIC STUDIES: Ct Chest W Contrast  01/22/2014   CLINICAL DATA:  74 year old male with history of lung cancer. Ongoing chemotherapy. Radiation therapy complete. Followup examination.  EXAM: CT CHEST WITH CONTRAST  TECHNIQUE: Multidetector CT imaging of the chest was performed during intravenous  contrast administration.  CONTRAST:  5mL OMNIPAQUE IOHEXOL 300 MG/ML  SOLN  COMPARISON:  Chest CT 11/02/2013.  FINDINGS: Mediastinum/Lymph Nodes: Right internal jugular single-lumen porta cath with tip terminating at the superior cavoatrial junction. Heart size is normal. There is no significant pericardial fluid, thickening or pericardial calcification. There is atherosclerosis of the thoracic aorta, the great vessels of the mediastinum and the coronary arteries, including calcified atherosclerotic plaque in the left main, left anterior descending, left circumflex and right coronary arteries. Mild calcifications of the aortic valve and moderate calcifications of the mitral annulus. Enlarging centrally low-attenuation 2.0 x 2.7 cm subcarinal lymph node. No other definite enlarged mediastinal or hilar lymph nodes are noted. No axillary lymphadenopathy.  Lungs/Pleura: The previously described mass in the medial aspect of the right lower lobe is very poorly visualized on today's examination, however, there is a relative avascular area in the medial aspect of the right lower lobe estimated to measure approximately 2.7 x 2.5 cm (image 38 of series 2), which likely represents residual tumor. Surrounding volume loss and airspace consolidation is noted in the adjacent portions of the right middle and lower lobes, where there is also extensive bronchiectasis, presumably evolving postradiation changes. Moderate right-sided pleural effusion. Widespread ground-glass attenuation scattered throughout the lungs bilaterally, with areas that have an an "atoll sign" appearance. This is new compared to the prior examination. There is a small amount of consolidation and air bronchograms in the medial aspect of the left lower lobe.  Musculoskeletal/Soft Tissues: There are no aggressive appearing lytic or blastic lesions noted in the visualized portions of the skeleton.  Upper Abdomen: Sub cm low-attenuation lesion in segment 2 of the  liver is too small to definitively characterize, but is unchanged compared to prior studies, favored to represent a small cyst.  IMPRESSION: 1. Further decreased size of right lower lobe pulmonary nodule which currently measures approximately 2.7 x 2.5 cm. 2. Interval enlargement of a centrally low-attenuation subcarinal lymph node which measures 2.0 x 2.7 cm. 3. In addition to the chronic postradiation masslike fibrosis noted in the medial aspect of the right lung (most prevalent in the right lower lobe), there are multifocal patchy areas of ground-glass attenuation throughout the lungs bilaterally which are new compared to the prior examination which have a very unusual appearance, with a characteristic "atoll sign" . This is typically seen in the setting of cryptogenic organizing pneumonia (COP), but can occasionally be seen with other entities, including opportunistic invasive fungal infections in the setting of an immunocompromised host. Clinical correlation is strongly recommended. These results will be called to the ordering clinician or representative by the Radiologist Assistant, and communication documented in the PACS or zVision Dashboard.   Electronically Signed   By: Vinnie Langton M.D.   On: 01/22/2014 14:40  ASSESSMENT AND PLAN: This is a very pleasant 74 years old Steve [name] male with:   1) metastatic non-small cell lung cancer initially diagnosed as stage IIIA non-small cell lung cancer who completed a course of concurrent chemoradiation with weekly carboplatin and paclitaxel with no significant complaints except for radiation induced odynophagia.  He also completed 3 cycles of consolidation chemotherapy with reduced dose carboplatin and paclitaxel. He is currently undergoing immunotherapy with Nivolumab status post  8 cycles. He is tolerating this treatment fairly well. He will proceed with cycle #9 today as a scheduled.  2) peripheral neuropathy: The patient will continue his current  treatment with Neurontin but will decrease the dose to 300 mg by mouth 3 times a day in addition to Cymbalta and oxycodone as needed.   3) dizzy spells: He had MRI of the brain on 11/03/2013 that was negative for brain metastasis or acute process. His dizziness seems to be of a cardiac nature. He has worn an event monitor and is to follow-up with Dr. Dorris Carnes, his cardiologist as scheduled.   Patient was discussed with and also seen by Dr. Julien Nordmann. Overall his restaging CT scan did show some improvement with less obstruction in his lung and some decrease in the size of the right lower lobe pulmonary nodule. There is some concern of some new groundglass attenuation as well as an unusual appearance with characteristic "atoll sign" concerning for possible fungal infection. The results were discussed with the patient and his wife. We will refer the patient to Dr. Chase Caller for further evaluation and management regarding this possible fungal infection. He'll follow-up in 2 weeks prior to the start of cycle #10.   He was advised to call immediately if he has any concerning symptoms in the interval.  The patient voices understanding of current disease status and treatment options and is in agreement with the current care plan.  All questions were answered. The patient knows to call the clinic with any problems, questions or concerns. We can certainly see the patient much sooner if necessary.  Carlton Adam, PA-C 01/26/2014 ADDENDUM:  Hematology/Oncology Attending:  I had a face to face encounter with the patient today. I recommended his care plan. This is a 74 years old Steve [name] male with metastatic non-small cell lung cancer, squamous cell carcinoma currently undergoing immunotherapy with Nivolumab status post 8 cycles and tolerating his treatment fairly well. The patient noticed improvement in his breathing over the last few weeks. Repeat CT scan of the chest showed mixed response with a slight  increase in the mediastinal lymph node but improvement in the obstructing the right lung mass with less collapse of the lung in that area. The scan also showed questionable areas of inflammatory suspicious for fungal infection.  I discussed the scan results and showed the images to the patient and his wife. I recommended for him to continue his current immunotherapy for now. I will refer the patient to Dr. Chase Caller his pulmonologist for evaluation and recommendation regarding the suspicious inflammatory areas in his lung. He was advised to call immediately if he has any concerning symptoms in the interval.  Disclaimer: This note was dictated with voice recognition software. Similar sounding words can inadvertently be transcribed and may be missed upon review. Eilleen Kempf., MD 01/26/2014

## 2014-01-26 NOTE — Patient Instructions (Signed)
Your restaging CT scan showed some improvement in your disease however there is also some area of concern for possible fungal infection. We're can refer you to Dr. Chase Caller for further evaluation and management regarding the possible fungal infection. You will continue on your current treatment with immunotherapy with Nivolumab Follow-up in 2 weeks prior to next scheduled cycle of immunotherapy

## 2014-01-28 ENCOUNTER — Other Ambulatory Visit (INDEPENDENT_AMBULATORY_CARE_PROVIDER_SITE_OTHER): Payer: Medicare HMO

## 2014-01-28 ENCOUNTER — Ambulatory Visit (INDEPENDENT_AMBULATORY_CARE_PROVIDER_SITE_OTHER): Payer: Medicare HMO | Admitting: Internal Medicine

## 2014-01-28 ENCOUNTER — Encounter: Payer: Self-pay | Admitting: Internal Medicine

## 2014-01-28 VITALS — BP 124/80 | HR 79 | Ht 72.0 in | Wt 176.0 lb

## 2014-01-28 DIAGNOSIS — J90 Pleural effusion, not elsewhere classified: Secondary | ICD-10-CM | POA: Insufficient documentation

## 2014-01-28 DIAGNOSIS — J948 Other specified pleural conditions: Secondary | ICD-10-CM

## 2014-01-28 DIAGNOSIS — C3431 Malignant neoplasm of lower lobe, right bronchus or lung: Secondary | ICD-10-CM | POA: Insufficient documentation

## 2014-01-28 DIAGNOSIS — J849 Interstitial pulmonary disease, unspecified: Secondary | ICD-10-CM | POA: Insufficient documentation

## 2014-01-28 LAB — CBC
HEMATOCRIT: 37.8 % — AB (ref 39.0–52.0)
HEMOGLOBIN: 12.4 g/dL — AB (ref 13.0–17.0)
MCHC: 32.7 g/dL (ref 30.0–36.0)
MCV: 80.8 fl (ref 78.0–100.0)
PLATELETS: 624 10*3/uL — AB (ref 150.0–400.0)
RBC: 4.68 Mil/uL (ref 4.22–5.81)
RDW: 17.9 % — AB (ref 11.5–15.5)
WBC: 11.2 10*3/uL — ABNORMAL HIGH (ref 4.0–10.5)

## 2014-01-28 NOTE — Patient Instructions (Addendum)
ICD-9-CM ICD-10-CM   1. Malignant neoplasm of lower lobe of right lung 162.5 C34.31   2. Pleural effusion, right 511.9 J94.8   3. ILD (interstitial lung disease) 515 J84.9    - please do blood work for thoracentesis and ILD  -  cbc, ldh, protein, albumin glucose, and lipase and ESR, PT, PTT - please do Right thoracentesis - return to see me after above to discuss bronchoscopy v empiric steroids   followup  <  2 weeks with me or NP Tammy

## 2014-01-28 NOTE — Progress Notes (Signed)
Subjective:    Patient ID: Steve [name], male    DOB: [birth year], 74 y.o.   MRN: [id number]  HPI  Stage IIIA (T2 B., N2, M0) non-small cell lung cancer Right Lowre Lobe consistent with poorly differentiated squamous cell carcinoma diagnosed in December of 2014.   He is s/p :  1) Concurrent chemoradiation with weekly carboplatin for an AUC of 2 and paclitaxel 45 mg/m2, status post 4 cycles. 2) Consolidation chemotherapy with carboplatin for AUC of 5 and paclitaxel 175 mg/M2 every 3 weeks with Neulasta support, first cycle on 03/23/2013. Status post 3 cycles.  But Currnt Rx: Immunotherapy with Nivolumab 3 mg/KG every 2 weeks. First dose 09/21/2013   OV 01/28/2014  Chief Complaint  Patient presents with  . Follow-up    Pt stated Dr. Earlie Server requested pt follow up with MR d/t chest congestion and abnormal CT-possible pna or fungal infection. Pt c/o DOE, increase in cough with brown mucus. Pt denies CP/tightness.       I have not seen him since Dec 2014 when I made the diagnosis of lung cancer and referrd him to Dr Julien Nordmann. His ECOG continues to be 0 to 1. Overall doing well except baseline mild class 2 dyspnea one exertion relieved by rest. He has been referred back due to findings of ILD new bilateral lower lobe GGO on surveillance  CT chest 01/22/14. Hwoeve, there is no difference in symptoms. Review of imaging shows that in august 2015 on CT he developed RLL paramedian XRT fibrosis v enlarging tumor and sometime aftrer this in early sept 2015 was started on nivolumab (currently 12 weeks into this Rx). Then in Oct 2015 CT he had new pleural effusion that was tappe 1.5L straw colored fluids (not analyzed) 10/28/13 but did not notice symptom difference. Then fu imaging in Jan 2016 shows persistence of XRT changes since aug 2015 and the pleural effusion from Oct 2015 but also new bilateral LL GGO ( > 12 weeks into Nivolumab Rx).  Past, Family, Social reviewed: no change since last visit othe  than described above   Review of Systems  Constitutional: Negative for fever and unexpected weight change.  HENT: Positive for congestion. Negative for dental problem, ear pain, nosebleeds, postnasal drip, rhinorrhea, sinus pressure, sneezing, sore throat and trouble swallowing.   Eyes: Negative for redness and itching.  Respiratory: Positive for cough and shortness of breath. Negative for chest tightness and wheezing.   Cardiovascular: Negative for palpitations and leg swelling.  Gastrointestinal: Negative for nausea and vomiting.  Genitourinary: Negative for dysuria.  Musculoskeletal: Negative for joint swelling.  Skin: Negative for rash.  Neurological: Negative for headaches.  Hematological: Does not bruise/bleed easily.  Psychiatric/Behavioral: Negative for dysphoric mood. The patient is not nervous/anxious.        Objective:   Physical Exam  Constitutional: He is oriented to person, place, and time. He appears well-developed and well-nourished. No distress.  HENT:  Head: Normocephalic and atraumatic.  Right Ear: External ear normal.  Left Ear: External ear normal.  Mouth/Throat: Oropharynx is clear and moist. No oropharyngeal exudate.  Eyes: Conjunctivae and EOM are normal. Pupils are equal, round, and reactive to light. Right eye exhibits no discharge. Left eye exhibits no discharge. No scleral icterus.  Neck: Normal range of motion. Neck supple. No JVD present. No tracheal deviation present. No thyromegaly present.  Cardiovascular: Normal rate, regular rhythm and intact distal pulses.  Exam reveals no gallop and no friction rub.   No murmur heard. Pulmonary/Chest:  Effort normal. No respiratory distress. He has no wheezes. He has no rales. He exhibits no tenderness.  Stony dullness on the right side Diminished air entry on the right base  Abdominal: Soft. Bowel sounds are normal. He exhibits no distension and no mass. There is no tenderness. There is no rebound and no  guarding.  Musculoskeletal: Normal range of motion. He exhibits no edema or tenderness.  Lymphadenopathy:    He has no cervical adenopathy.  Neurological: He is alert and oriented to person, place, and time. He has normal reflexes. No cranial nerve deficit. Coordination normal.  Skin: Skin is warm and dry. No rash noted. He is not diaphoretic. No erythema. No pallor.  Psychiatric: He has a normal mood and affect. His behavior is normal. Judgment and thought content normal.  Nursing note and vitals reviewed.   Filed Vitals:   01/28/14 1601  BP: 124/80  Pulse: 79  Height: 6' (1.829 m)  Weight: 176 lb (79.833 kg)  SpO2: 93%  ;ls      Assessment & Plan:     ICD-9-CM ICD-10-CM   1. Malignant neoplasm of lower lobe of right lung 162.5 C34.31 US THORACENTESIS ASP PLEURAL SPACE W/IMG GUIDE  2. Pleural effusion, right 511.9 J94.8 US THORACENTESIS ASP PLEURAL SPACE W/IMG GUIDE  3. ILD (interstitial lung disease) 56 J84.9 US THORACENTESIS ASP PLEURAL SPACE W/IMG GUIDE   He has been referred for the ILD changes on CT jan 2016 that is new since Aug 2015 and Oct 2015 and presenting  > 12 weeks into Nivolumab Rx. Prmary concern is that thisis Nivolumab toxicity. However, looking at him globally from pulm standpoint, the right pleural effusion is concerning for malignancy. I am surprised none of this is making dyspnea worse ? He is habituated to it.   Plan First will plan therapeutic thora + diagnostic for right pleural effusion: if malignant cells /exudate/recurs - > consider VATS pleurdoesis  Depending on results of above will have to address ILD findngs on CT - ?  Need bronch BAL to rule out opportuinistic infection and commit to prednisone for potential nivolumab toxicity  > 50% of this > 25 min visit spent in face to face counseling or coordination of care (15 min visit converted to 25 min)  He is agreeable to plan  Dr. Brand Males, M.D., [facility].C.P Pulmonary and Critical Care  Medicine Staff Physician [facility] Pager: [phone], If no answer or between  15:00h - 7:00h: call [phone]  01/30/2014 1:30 AM

## 2014-01-29 LAB — LACTATE DEHYDROGENASE: LDH: 185 U/L (ref 94–250)

## 2014-02-01 LAB — LIPASE: LIPASE: 59 U/L (ref 11.0–59.0)

## 2014-02-01 LAB — ALBUMIN: Albumin: 4.2 g/dL (ref 3.5–5.2)

## 2014-02-01 LAB — PROTEIN, TOTAL: Total Protein: 8.7 g/dL — ABNORMAL HIGH (ref 6.0–8.3)

## 2014-02-01 LAB — SEDIMENTATION RATE: Sed Rate: 97 mm/hr — ABNORMAL HIGH (ref 0–22)

## 2014-02-02 ENCOUNTER — Telehealth: Payer: Self-pay | Admitting: Internal Medicine

## 2014-02-02 LAB — PT AND PTT

## 2014-02-02 NOTE — Telephone Encounter (Signed)
Spoke with pt, states that he had wanted to move his thoracentesis up to a closer date but realized it is just next Thursday and changed his mind.  Nothing further needed at this time.

## 2014-02-08 ENCOUNTER — Telehealth: Payer: Self-pay | Admitting: Internal Medicine

## 2014-02-08 ENCOUNTER — Other Ambulatory Visit: Payer: Medicare HMO

## 2014-02-08 ENCOUNTER — Ambulatory Visit: Payer: Medicare HMO | Admitting: Physician Assistant

## 2014-02-08 NOTE — Telephone Encounter (Signed)
pt called to get MD visit same day as chemo...done...pt ok and aware

## 2014-02-09 ENCOUNTER — Encounter: Payer: Self-pay | Admitting: *Deleted

## 2014-02-09 ENCOUNTER — Other Ambulatory Visit: Payer: Medicare HMO

## 2014-02-09 ENCOUNTER — Ambulatory Visit (HOSPITAL_BASED_OUTPATIENT_CLINIC_OR_DEPARTMENT_OTHER): Payer: Medicare HMO | Admitting: Internal Medicine

## 2014-02-09 ENCOUNTER — Ambulatory Visit: Payer: Medicare HMO

## 2014-02-09 ENCOUNTER — Other Ambulatory Visit (HOSPITAL_BASED_OUTPATIENT_CLINIC_OR_DEPARTMENT_OTHER): Payer: Medicare HMO

## 2014-02-09 ENCOUNTER — Ambulatory Visit (HOSPITAL_BASED_OUTPATIENT_CLINIC_OR_DEPARTMENT_OTHER): Payer: Medicare HMO

## 2014-02-09 ENCOUNTER — Encounter: Payer: Self-pay | Admitting: Internal Medicine

## 2014-02-09 ENCOUNTER — Telehealth: Payer: Self-pay | Admitting: Internal Medicine

## 2014-02-09 VITALS — BP 122/71 | HR 88 | Resp 18 | Ht 72.0 in | Wt 175.7 lb

## 2014-02-09 DIAGNOSIS — C3431 Malignant neoplasm of lower lobe, right bronchus or lung: Secondary | ICD-10-CM

## 2014-02-09 DIAGNOSIS — Z5112 Encounter for antineoplastic immunotherapy: Secondary | ICD-10-CM

## 2014-02-09 DIAGNOSIS — Z95828 Presence of other vascular implants and grafts: Secondary | ICD-10-CM

## 2014-02-09 DIAGNOSIS — J90 Pleural effusion, not elsewhere classified: Secondary | ICD-10-CM

## 2014-02-09 DIAGNOSIS — Z79899 Other long term (current) drug therapy: Secondary | ICD-10-CM

## 2014-02-09 DIAGNOSIS — R5383 Other fatigue: Secondary | ICD-10-CM

## 2014-02-09 LAB — TSH CHCC: TSH: 2.136 m(IU)/L (ref 0.320–4.118)

## 2014-02-09 LAB — COMPREHENSIVE METABOLIC PANEL (CC13)
ALT: 23 U/L (ref 0–55)
ANION GAP: 11 meq/L (ref 3–11)
AST: 20 U/L (ref 5–34)
Albumin: 3.3 g/dL — ABNORMAL LOW (ref 3.5–5.0)
Alkaline Phosphatase: 90 U/L (ref 40–150)
BILIRUBIN TOTAL: 0.24 mg/dL (ref 0.20–1.20)
BUN: 22 mg/dL (ref 7.0–26.0)
CO2: 26 meq/L (ref 22–29)
CREATININE: 1.1 mg/dL (ref 0.7–1.3)
Calcium: 9.5 mg/dL (ref 8.4–10.4)
Chloride: 103 mEq/L (ref 98–109)
EGFR: 69 mL/min/{1.73_m2} — ABNORMAL LOW (ref >90)
Glucose: 135 mg/dl (ref 70–140)
Potassium: 4.7 mEq/L (ref 3.5–5.1)
SODIUM: 140 meq/L (ref 136–145)
TOTAL PROTEIN: 7.6 g/dL (ref 6.4–8.3)

## 2014-02-09 LAB — CBC WITH DIFFERENTIAL/PLATELET
BASO%: 0.2 % (ref 0.0–2.0)
Basophils Absolute: 0 10*3/uL (ref 0.0–0.1)
EOS ABS: 0.2 10*3/uL (ref 0.0–0.5)
EOS%: 1.8 % (ref 0.0–7.0)
HEMATOCRIT: 38.4 % (ref 38.4–49.9)
HGB: 11.9 g/dL — ABNORMAL LOW (ref 13.0–17.1)
LYMPH%: 12.5 % — AB (ref 14.0–49.0)
MCH: 26.1 pg — ABNORMAL LOW (ref 27.2–33.4)
MCHC: 31 g/dL — ABNORMAL LOW (ref 32.0–36.0)
MCV: 84.2 fL (ref 79.3–98.0)
MONO#: 0.9 10*3/uL (ref 0.1–0.9)
MONO%: 8.9 % (ref 0.0–14.0)
NEUT#: 7.3 10*3/uL — ABNORMAL HIGH (ref 1.5–6.5)
NEUT%: 76.6 % — AB (ref 39.0–75.0)
Platelets: 529 10*3/uL — ABNORMAL HIGH (ref 140–400)
RBC: 4.56 10*6/uL (ref 4.20–5.82)
RDW: 16.8 % — AB (ref 11.0–14.6)
WBC: 9.5 10*3/uL (ref 4.0–10.3)
lymph#: 1.2 10*3/uL (ref 0.9–3.3)

## 2014-02-09 MED ORDER — SODIUM CHLORIDE 0.9 % IJ SOLN
10.0000 mL | INTRAMUSCULAR | Status: DC | PRN
  Administered 2014-02-09: 10 mL
  Filled 2014-02-09: qty 10

## 2014-02-09 MED ORDER — SODIUM CHLORIDE 0.9 % IJ SOLN
10.0000 mL | INTRAMUSCULAR | Status: DC | PRN
  Administered 2014-02-09: 10 mL via INTRAVENOUS
  Filled 2014-02-09: qty 10

## 2014-02-09 MED ORDER — HEPARIN SOD (PORK) LOCK FLUSH 100 UNIT/ML IV SOLN
500.0000 [IU] | Freq: Once | INTRAVENOUS | Status: AC | PRN
  Administered 2014-02-09: 500 [IU]
  Filled 2014-02-09: qty 5

## 2014-02-09 MED ORDER — SODIUM CHLORIDE 0.9 % IV SOLN: 3.0000 mg/kg | Freq: Once | INTRAVENOUS | Status: DC

## 2014-02-09 MED ORDER — SODIUM CHLORIDE 0.9 % IV SOLN
Freq: Once | INTRAVENOUS | Status: AC
  Administered 2014-02-09: 12:00:00 via INTRAVENOUS

## 2014-02-09 MED ORDER — NIVOLUMAB CHEMO INJECTION 100 MG/10ML
3.0000 mg/kg | Freq: Once | INTRAVENOUS | Status: DC
  Filled 2014-02-09: qty 24

## 2014-02-09 MED ORDER — SODIUM CHLORIDE 0.9 % IV SOLN
3.0000 mg/kg | Freq: Once | INTRAVENOUS | Status: AC
  Administered 2014-02-09: 260 mg via INTRAVENOUS
  Filled 2014-02-09: qty 26

## 2014-02-09 NOTE — Telephone Encounter (Signed)
Pt confirmed labs/ov per 02/02 POF, gave pt AVS.... KJ, sent msg to add chemo

## 2014-02-09 NOTE — Progress Notes (Signed)
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   Primary site: Lung (Right)  Staging method: AJCC 7th Edition  Clinical: Stage IIIA (T2b, N2, M0)  Summary: Stage IIIA (T2b, N2, M0)   PRIOR THERAPY:  1) Concurrent chemoradiation with weekly carboplatin for an AUC of 2 and paclitaxel 45 mg/m2, status post 4 cycles. 2) Consolidation chemotherapy with carboplatin for AUC of 5 and paclitaxel 175 mg/M2 every 3 weeks with Neulasta support, first cycle on 03/23/2013. Status post 3 cycles.  CURRENT THERAPY: Immunotherapy with Nivolumab 3 mg/KG every 2 weeks. First dose 09/21/2013. Status post 9 cycles.  DISEASE STAGE:  Lung cancer  Primary site: Lung (Right)  Staging method: AJCC 7th Edition  Clinical: Stage IIIA (T2b, N2, M0)  Summary: Stage IIIA (T2b, N2, M0)  CHEMOTHERAPY INTENT: Control/Palliative  CURRENT # OF CHEMOTHERAPY CYCLES: 10 CURRENT ANTIEMETICS: Zofran, dexamethasone, Compazine  CURRENT SMOKING STATUS: Former smoker, quit 01/09/2007  ORAL CHEMOTHERAPY AND CONSENT: n/a  CURRENT BISPHOSPHONATES USE: none  PAIN MANAGEMENT: Ibuprofen  NARCOTICS INDUCED CONSTIPATION: None  LIVING WILL AND CODE STATUS: ?  INTERVAL HISTORY: Steve [name] 74 y.o. male returns to the clinic today for followup visit. He is tolerating his treatment with Nivolumab fairly well with no significant adverse effects. He denied having any significant chest pain but continues to have shortness of breath with exertion with mild cough without hemoptysis He denied having any weight loss or night sweats. The patient has no fever or chills and no nausea or vomiting. He was seen  recently by Dr. Chase Caller for evaluation of his persistent shortness of breath and questionable inflammatory process in the lung. Dr. Chase Caller is considering the patient for right sided thoracentesis. This is a scheduled for 02/11/2014. The patient has question about the need to proceed with the thoracentesis.   MEDICAL HISTORY: Past Medical History  Diagnosis Date  . Dizziness and giddiness     positional vertigo  . Hyperlipidemia   . Impotence of organic origin   . Osteoarthritis, shoulder   . Allergy   . Hypothyroidism     pt denies thryoid disease, dr pandey note 11-19-12 says subclinical hypothryoid in epic  . COPD (chronic obstructive pulmonary disease)   . Insomnia   . Hx of radiation therapy 02/2013    right lung  . Diabetes mellitus without complication     pt denies diabetes, dr pandey note says dm 11-19-12 epic  . 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 Malignant neoplasm of bladder, part unspecified   . Malignant neoplasm of bronchus and lung, unspecified site     ALLERGIES:  is allergic to morphine and related.  MEDICATIONS:  Current Outpatient Prescriptions  Medication Sig Dispense Refill  . aspirin 81 MG tablet Take 81 mg by mouth daily.    . DULoxetine (CYMBALTA) 60 MG capsule Take 1 capsule (60 mg total) by mouth daily. (Patient taking differently: Take 60 mg by mouth every morning. ) 30 capsule 3  . flecainide (TAMBOCOR) 150 MG tablet Take 0.5 tablets (75 mg total) by mouth 2 (two) times daily. 90 tablet 1  . gabapentin (NEURONTIN) 300 MG capsule TAKE 1 CAPSULE BY MOUTH THREE  TIMES DAILY (Patient taking differently: TAKE 1 CAPSULE BY MOUTH TWICE DAILY) 270 capsule 0  . metoprolol tartrate (LOPRESSOR) 25 MG tablet Take 1 tablet (25 mg total) by mouth 2 (two) times daily. 90 tablet 1  . midodrine (PROAMATINE) 2.5 MG tablet Take 1 tablet (2.5 mg total) by mouth daily. 90 tablet 5  . oxyCODONE-acetaminophen (ROXICET) 5-325 MG per tablet Take 1 tablet  by mouth every 6 (six) hours as needed for severe pain. 120 tablet 0  . simvastatin (ZOCOR) 20 MG tablet Take one tablet by mouth once daily to lower cholesterol 90 tablet 3  . zolpidem (AMBIEN) 10 MG tablet Take one tablet by mouth once daily at bedtime for rest 90 tablet 0   No current facility-administered medications for this visit.    SURGICAL HISTORY:  Past Surgical History  Procedure Laterality Date  . Cystoscopy w/ dilation of bladder    . Tonsillectomy  age 46  . Endobronchial ultrasound Bilateral 12/15/2012    Procedure: ENDOBRONCHIAL ULTRASOUND;  Surgeon: Brand Males, MD;  Location: [facility];  Service: Cardiopulmonary;  Laterality: Bilateral;  . Colonoscopy  2009    [facility]    REVIEW OF SYSTEMS:  Constitutional: negative Eyes: negative Ears, nose, mouth, throat, and face: negative Respiratory: positive for cough, dyspnea on exertion and wheezing Cardiovascular: negative Gastrointestinal: negative Genitourinary:negative Integument/breast: negative Hematologic/lymphatic: negative Musculoskeletal:negative Neurological: positive for paresthesia Behavioral/Psych: negative Endocrine: negative Allergic/Immunologic: negative   PHYSICAL EXAMINATION: General appearance: alert, cooperative and no distress Head: Normocephalic, without obvious abnormality, atraumatic Neck: no adenopathy, no JVD, supple, symmetrical, trachea midline and thyroid not enlarged, symmetric, no tenderness/mass/nodules Lymph nodes: Cervical, supraclavicular, and axillary nodes normal. Resp: clear to auscultation bilaterally Back: symmetric, no curvature. ROM normal. No CVA tenderness. Cardio: regular rate and rhythm, S1, S2 normal, no murmur, click, rub or gallop GI: soft, non-tender; bowel sounds normal; no masses,  no organomegaly Extremities: extremities normal, atraumatic, no cyanosis or edema Neurologic: Alert and oriented X 3, normal strength and tone. Normal symmetric reflexes. Normal  coordination and gait  ECOG PERFORMANCE STATUS: 1 - Symptomatic but completely ambulatory  Blood pressure 122/71, pulse 88, resp. rate 18, height 6' (1.829 m), weight 175 lb 11.2 oz (79.697 kg), SpO2 97 %.  LABORATORY DATA: Lab Results  Component Value Date   WBC 9.5 02/09/2014   HGB 11.9* 02/09/2014   HCT 38.4 02/09/2014   MCV 84.2 02/09/2014   PLT 529* 02/09/2014      Chemistry      Component Value Date/Time   NA 140 01/26/2014 1346   NA 139 11/16/2013 0755   NA 142 04/28/2013 0819   K 4.2 01/26/2014 1346   K 4.1 11/16/2013 0755   CL 100 11/16/2013 0755   CO2 24 01/26/2014 1346   CO2 23 11/16/2013 0755   BUN 21.1 01/26/2014 1346   BUN 12 11/16/2013 0755   BUN 12 04/28/2013 0819   CREATININE 0.8 01/26/2014 1346   CREATININE 0.73 11/16/2013 0755      Component Value Date/Time   CALCIUM 9.4 01/26/2014 1346   CALCIUM 10.0 11/16/2013 0755   ALKPHOS 93 01/26/2014 1346   ALKPHOS 107 04/28/2013 0819   AST 31 01/26/2014 1346   AST 19 04/28/2013 0819   ALT 30 01/26/2014 1346   ALT 12 04/28/2013 0819   BILITOT 0.29 01/26/2014 1346   BILITOT 0.4 04/28/2013 0819       RADIOGRAPHIC STUDIES:  ASSESSMENT AND PLAN: This is a very pleasant 74 years old Klopf male  with:   1) metastatic non-small cell lung cancer initially diagnosed as stage IIIA non-small cell lung cancer who completed a course of concurrent chemoradiation with weekly carboplatin and paclitaxel with no significant complaints except for radiation induced odynophagia.  He also completed 3 cycles of consolidation chemotherapy with reduced dose carboplatin and paclitaxel. He is currently undergoing immunotherapy with Nivolumab status post  9 cycles. He is tolerating this treatment fairly well. We will proceed with cycle # 10 today as a scheduled.   2) right sided pleural effusion: This has been persistent for several months and it was discussed and the weekly thoracic conference before.  It was felt that  thoracenteses or Pleurx catheter may not help reexpand his lung because of the central obstructing right lower lobe lesion. I discussed this with Dr. Chase Caller later today and he is still like to proceed with the thoracentesis for diagnostic reasons. He will notify the patient to keep the appointment.  He would come back for followup visit in 2 weeks before the next cycle of his treatment for reevaluation before the start of cycle #11 of his treatment.  He was advised to call immediately if he has any concerning symptoms in the interval.  The patient voices understanding of current disease status and treatment options and is in agreement with the current care plan.  All questions were answered. The patient knows to call the clinic with any problems, questions or concerns. We can certainly see the patient much sooner if necessary.  Disclaimer: This note was dictated with voice recognition software. Similar sounding words can inadvertently be transcribed and may not be corrected upon review.

## 2014-02-09 NOTE — Patient Instructions (Signed)

## 2014-02-09 NOTE — Patient Instructions (Signed)
[facility] Discharge Instructions for Patients Receiving Chemotherapy  Today you received the following chemotherapy agents nivolumab   To help prevent nausea and vomiting after your treatment, we encourage you to take your nausea medication as directed   If you develop nausea and vomiting that is not controlled by your nausea medication, call the clinic.   BELOW ARE SYMPTOMS THAT SHOULD BE REPORTED IMMEDIATELY:  *FEVER GREATER THAN 100.5 F  *CHILLS WITH OR WITHOUT FEVER  NAUSEA AND VOMITING THAT IS NOT CONTROLLED WITH YOUR NAUSEA MEDICATION  *UNUSUAL SHORTNESS OF BREATH  *UNUSUAL BRUISING OR BLEEDING  TENDERNESS IN MOUTH AND THROAT WITH OR WITHOUT PRESENCE OF ULCERS  *URINARY PROBLEMS  *BOWEL PROBLEMS  UNUSUAL RASH Items with * indicate a potential emergency and should be followed up as soon as possible.  Feel free to call the clinic you have any questions or concerns. The clinic phone number is [phone].  

## 2014-02-09 NOTE — [facility] Oncology Navigator Note (Unsigned)
Spoke with patient today.  He is receiving treatment.  He has compliant of pain.  Dr. Julien Nordmann addressed pain issue.  All other concerns addressed.

## 2014-02-11 ENCOUNTER — Ambulatory Visit (HOSPITAL_COMMUNITY)
Admission: RE | Admit: 2014-02-11 | Discharge: 2014-02-11 | Disposition: A | Discharge status: Home / Self Care | Payer: Medicare HMO | Source: Ambulatory Visit | Attending: Radiology | Admitting: Radiology

## 2014-02-11 ENCOUNTER — Ambulatory Visit (HOSPITAL_COMMUNITY)
Admission: RE | Admit: 2014-02-11 | Discharge: 2014-02-11 | Disposition: A | Discharge status: Home / Self Care | Payer: Medicare HMO | Source: Ambulatory Visit | Attending: Internal Medicine | Admitting: Internal Medicine

## 2014-02-11 DIAGNOSIS — J90 Pleural effusion, not elsewhere classified: Secondary | ICD-10-CM | POA: Diagnosis present

## 2014-02-11 DIAGNOSIS — C3490 Malignant neoplasm of unspecified part of unspecified bronchus or lung: Secondary | ICD-10-CM | POA: Diagnosis not present

## 2014-02-11 DIAGNOSIS — J439 Emphysema, unspecified: Secondary | ICD-10-CM | POA: Insufficient documentation

## 2014-02-11 LAB — GLUCOSE, SEROUS FLUID: GLUCOSE FL: 104 mg/dL

## 2014-02-11 LAB — PROTEIN, BODY FLUID: Total protein, fluid: 4.7 g/dL

## 2014-02-11 LAB — LACTATE DEHYDROGENASE, PLEURAL OR PERITONEAL FLUID: LD FL: 182 U/L — AB (ref 3–23)

## 2014-02-11 LAB — BODY FLUID CELL COUNT WITH DIFFERENTIAL
Eos, Fluid: 0 %
Lymphs, Fluid: 81 %
MONOCYTE-MACROPHAGE-SEROUS FLUID: 18 % — AB (ref 50–90)
NEUTROPHIL FLUID: 1 % (ref 0–25)
WBC FLUID: 2780 uL — AB (ref 0–1000)

## 2014-02-11 MED ORDER — LIDOCAINE HCL (PF) 1 % IJ SOLN
INTRAMUSCULAR | Status: AC
  Filled 2014-02-11: qty 10

## 2014-02-11 NOTE — Procedures (Signed)
Successful US guided right thoracentesis. Yielded 552mL of clear yellow fluid. Pt tolerated procedure well. No immediate complications.  Specimen was sent for labs. CXR ordered.  Ascencion Dike PA-C 02/11/2014 1:35 PM

## 2014-02-12 ENCOUNTER — Ambulatory Visit (INDEPENDENT_AMBULATORY_CARE_PROVIDER_SITE_OTHER): Payer: Medicare HMO | Admitting: Internal Medicine

## 2014-02-12 ENCOUNTER — Encounter: Payer: Self-pay | Admitting: Internal Medicine

## 2014-02-12 VITALS — BP 120/80 | HR 86 | Ht 72.0 in | Wt 175.0 lb

## 2014-02-12 DIAGNOSIS — J849 Interstitial pulmonary disease, unspecified: Secondary | ICD-10-CM

## 2014-02-12 DIAGNOSIS — C3431 Malignant neoplasm of lower lobe, right bronchus or lung: Secondary | ICD-10-CM

## 2014-02-12 DIAGNOSIS — J948 Other specified pleural conditions: Secondary | ICD-10-CM

## 2014-02-12 DIAGNOSIS — J90 Pleural effusion, not elsewhere classified: Secondary | ICD-10-CM

## 2014-02-12 LAB — PH, BODY FLUID: pH, Fluid: 7.5

## 2014-02-12 LAB — AMYLASE, PLEURAL FLUID: Amylase, Pleural Fluid: 79 U/L

## 2014-02-12 NOTE — Patient Instructions (Addendum)
ICD-9-CM ICD-10-CM   1. Malignant neoplasm of lower lobe of right lung 162.5 C34.31   2. Pleural effusion, right 511.9 J94.8   3. ILD (interstitial lung disease) 515 J84.9    - - Await remainder of the pleural effusion results especially analysis for cancer cells - Regarding the inflammation/scar tissue Lungs   --Will discuss with Dr. Earlie Server about a preference to continue with your current chemotherapy, reluctance to undergo bronchoscopy and acceptance of chronic prednisone therapy  #Follow-up  - You should be able to hear from [place] by  Wednesday, 02/17/2014 - If you do not please do call us on or after 02/18/2014 Thursday

## 2014-02-12 NOTE — Progress Notes (Signed)
Subjective:    Patient ID: Steve [name], male    DOB: 03-Aug-1940, 74 y.o.   MRN: [id number]  HPI   Stage IIIA (T2 B., N2, M0) non-small cell lung cancer Right Lowre Lobe consistent with poorly differentiated squamous cell carcinoma diagnosed in December of 2014.   He is s/p :  1) Concurrent chemoradiation with weekly carboplatin for an AUC of 2 and paclitaxel 45 mg/m2, status post 4 cycles. 2) Consolidation chemotherapy with carboplatin for AUC of 5 and paclitaxel 175 mg/M2 every 3 weeks with Neulasta support, first cycle on 03/23/2013. Status post 3 cycles.  But Currnt Rx: Immunotherapy with Nivolumab 3 mg/KG every 2 weeks. First dose 09/21/2013   OV 01/28/2014  Chief Complaint  Patient presents with  . Follow-up    Pt stated Dr. Earlie Server requested pt follow up with MR d/t chest congestion and abnormal CT-possible pna or fungal infection. Pt c/o DOE, increase in cough with brown mucus. Pt denies CP/tightness.       I have not seen him since Dec 2014 when I made the diagnosis of lung cancer and referrd him to Dr Julien Nordmann. His ECOG continues to be 0 to 1. Overall doing well except baseline mild class 2 dyspnea one exertion relieved by rest. He has been referred back due to findings of ILD new bilateral lower lobe GGO on surveillance  CT chest 01/22/14. Hwoeve, there is no difference in symptoms. Review of imaging shows that in august 2015 on CT he developed RLL paramedian XRT fibrosis v enlarging tumor and sometime aftrer this in early sept 2015 was started on nivolumab (currently 12 weeks into this Rx). Then in Oct 2015 CT he had new pleural effusion that was tappe 1.5L straw colored fluids (not analyzed) 10/28/13 but did not notice symptom difference. Then fu imaging in Jan 2016 shows persistence of XRT changes since aug 2015 and the pleural effusion from Oct 2015 but also new bilateral LL GGO ( > 12 weeks into Nivolumab Rx).  Past, Family, Social reviewed: no change since last visit othe  than described above   OV 02/12/2014  Chief Complaint  Patient presents with  . Follow-up    Pt had thoracentesis. Pt stated his breathing is unchanged. Pt c/o cough with little mucus production - yellow in color. Pt questions if he has COPD. Pt denies CP/tightness.     Follow-up lung cancer patient on salvage monoclonal antibody nivolumab  Rx. Along with right pleural effusion.  He was referred to last month for new onset of interstitial lung disease findings which fit in the timeframe of pneumonitis pulmonary toxicity related to his monoclonal antibody infusions. He also had right pleural effusion. He had a find it 500 cc successful almost complete thoracentesis yesterday. It is an exudate with predominantly lymphocytes. This no evidence of empyema the pH is normal. Cytology and microbiology cultures are pending. He feels baseline which is chronic unchanged shortness of breath and cough. He says in the past his inhalers are not helping. But he wonders if he has COPD. however spirometry today shows mild restriction with an FVC of 71%.   ECOG 0  Review of Systems  Constitutional: Negative for fever and unexpected weight change.  HENT: Negative for congestion, dental problem, ear pain, nosebleeds, postnasal drip, rhinorrhea, sinus pressure, sneezing, sore throat and trouble swallowing.   Eyes: Negative for redness and itching.  Respiratory: Positive for cough and shortness of breath. Negative for chest tightness and wheezing.   Cardiovascular: Negative for  palpitations and leg swelling.  Gastrointestinal: Negative for nausea and vomiting.  Genitourinary: Negative for dysuria.  Musculoskeletal: Negative for joint swelling.  Skin: Negative for rash.  Neurological: Negative for headaches.  Hematological: Does not bruise/bleed easily.  Psychiatric/Behavioral: Negative for dysphoric mood. The patient is not nervous/anxious.        Objective:   Physical Exam  Constitutional: He is oriented  to person, place, and time. He appears well-developed and well-nourished. No distress.  HENT:  Head: Normocephalic and atraumatic.  Right Ear: External ear normal.  Left Ear: External ear normal.  Mouth/Throat: Oropharynx is clear and moist. No oropharyngeal exudate.  Eyes: Conjunctivae and EOM are normal. Pupils are equal, round, and reactive to light. Right eye exhibits no discharge. Left eye exhibits no discharge. No scleral icterus.  Neck: Normal range of motion. Neck supple. No JVD present. No tracheal deviation present. No thyromegaly present.  Cardiovascular: Normal rate, regular rhythm and intact distal pulses.  Exam reveals no gallop and no friction rub.   No murmur heard. Pulmonary/Chest: Effort normal and breath sounds normal. No respiratory distress. He has no wheezes. He has no rales. He exhibits no tenderness.  Abdominal: Soft. Bowel sounds are normal. He exhibits no distension and no mass. There is no tenderness. There is no rebound and no guarding.  Musculoskeletal: Normal range of motion. He exhibits no edema or tenderness.  Lymphadenopathy:    He has no cervical adenopathy.  Neurological: He is alert and oriented to person, place, and time. He has normal reflexes. No cranial nerve deficit. Coordination normal.  Skin: Skin is warm and dry. No rash noted. He is not diaphoretic. No erythema. No pallor.  Psychiatric: He has a normal mood and affect. His behavior is normal. Judgment and thought content normal.  Nursing note and vitals reviewed.   Filed Vitals:   02/12/14 1440  BP: 120/80  Pulse: 86  Height: 6' (1.829 m)  Weight: 175 lb (79.379 kg)  SpO2: 97%         Assessment & Plan:     ICD-9-CM ICD-10-CM   1. ILD (interstitial lung disease) 515 J84.9 Spirometry with Graph  2. Malignant neoplasm of lower lobe of right lung 162.5 C34.31   3. Pleural effusion, right 511.9 J94.8    My concern regarding his pleural effusion is that this is a malignant pleural  effusion. In discussion with Dr. Earlie Server was present and his pleural effusion to the thoracic tumor Board the consensus view is that this is reactive effusion from chronic collapse of the right lower lobe of the lung. Nevertheless we will wait for cytology of malignant cells. If this is indeed positive he might have to have a discussion about pleurodesis or a Pleurx catheter particular lift effusion recurs   Re: Interstitial lung disease changes: Very low probability for opportunistic infections but very high probability for monoclonal antibody chemotherapy Nivoumab related pulmonary toxicity based on time sequence. Discussed bronchoscopy with bronchoalveolar lavage to rule out opportunistic infections but given low pretest probability and patient feeling okay he is against this. Discussed stopping  chemotherapy but he is not in favor of this. He is agreeable to trying chronic prednisone along with continued chemotherapy. I will discuss with his oncologist Dr. Earlie Server about this and get back to him  PLAN - Await remainder of the pleural effusion results especially analysis for cancer cells - Regarding the inflammation/scar tissue Lungs   --Will discuss with Dr. Earlie Server about a preference to continue with your current chemotherapy,  reluctance to undergo bronchoscopy and acceptance of chronic prednisone therapy  #Follow-up  - You should be able to hear from [place] by  Wednesday, 02/17/2014 - If you do not please do call us on or after 02/18/2014 Thursday    > 50% of this > 25 min visit spent in face to face counseling or coordination of care (15 min visit converted to 25 min)   Dr. Brand Males, M.D., [facility].C.P Pulmonary and Critical Care Medicine Staff Physician [facility] Pager: [phone], If no answer or between  15:00h - 7:00h: call [phone]  02/12/2014 3:44 PM

## 2014-02-15 LAB — OTHER BODY FLUID CHEMISTRY

## 2014-02-15 LAB — BODY FLUID CULTURE: Culture: NO GROWTH

## 2014-02-18 ENCOUNTER — Other Ambulatory Visit: Payer: Self-pay | Admitting: Internal Medicine

## 2014-02-18 DIAGNOSIS — Z85118 Personal history of other malignant neoplasm of bronchus and lung: Secondary | ICD-10-CM

## 2014-02-19 ENCOUNTER — Other Ambulatory Visit: Payer: Self-pay | Admitting: *Deleted

## 2014-02-19 MED ORDER — ZOLPIDEM TARTRATE 10 MG PO TABS
ORAL_TABLET | ORAL | Status: DC
Start: 2014-02-19 — End: 2014-03-23

## 2014-02-19 NOTE — Telephone Encounter (Signed)
Patient Requested and phoned to pharmacy

## 2014-02-22 ENCOUNTER — Ambulatory Visit (INDEPENDENT_AMBULATORY_CARE_PROVIDER_SITE_OTHER): Payer: Medicare HMO | Admitting: Internal Medicine

## 2014-02-22 ENCOUNTER — Encounter: Payer: Self-pay | Admitting: Internal Medicine

## 2014-02-22 VITALS — BP 106/68 | HR 88 | Temp 97.4°F | Resp 18 | Ht 72.0 in | Wt 176.0 lb

## 2014-02-22 DIAGNOSIS — C3490 Malignant neoplasm of unspecified part of unspecified bronchus or lung: Secondary | ICD-10-CM

## 2014-02-22 DIAGNOSIS — J0110 Acute frontal sinusitis, unspecified: Secondary | ICD-10-CM

## 2014-02-22 DIAGNOSIS — J90 Pleural effusion, not elsewhere classified: Secondary | ICD-10-CM

## 2014-02-22 MED ORDER — DOXYCYCLINE HYCLATE 100 MG PO TABS: 100.0000 mg | ORAL_TABLET | Freq: Two times a day (BID) | ORAL | Status: DC

## 2014-02-22 NOTE — Patient Instructions (Addendum)
Please eat yogurt daily while taking the doxycycline antibiotic.  You may need to eat with it so it does not upset your stomach. Try to flush your sinuses with warm salt water (nettipot) Get plenty of rest Drink lots of water. Sinusitis Sinusitis is redness, soreness, and inflammation of the paranasal sinuses. Paranasal sinuses are air pockets within the bones of your face (beneath the eyes, the middle of the forehead, or above the eyes). In healthy paranasal sinuses, mucus is able to drain out, and air is able to circulate through them by way of your nose. However, when your paranasal sinuses are inflamed, mucus and air can become trapped. This can allow bacteria and other germs to grow and cause infection. Sinusitis can develop quickly and last only a short time (acute) or continue over a long period (chronic). Sinusitis that lasts for more than 12 weeks is considered chronic.  CAUSES  Causes of sinusitis include:  Allergies.  Structural abnormalities, such as displacement of the cartilage that separates your nostrils (deviated septum), which can decrease the air flow through your nose and sinuses and affect sinus drainage.  Functional abnormalities, such as when the small hairs (cilia) that line your sinuses and help remove mucus do not work properly or are not present. SIGNS AND SYMPTOMS  Symptoms of acute and chronic sinusitis are the same. The primary symptoms are pain and pressure around the affected sinuses. Other symptoms include:  Upper toothache.  Earache.  Headache.  Bad breath.  Decreased sense of smell and taste.  A cough, which worsens when you are lying flat.  Fatigue.  Fever.  Thick drainage from your nose, which often is green and may contain pus (purulent).  Swelling and warmth over the affected sinuses. DIAGNOSIS  Your health care provider will perform a physical exam. During the exam, your health care provider may:  Look in your nose for signs of abnormal  growths in your nostrils (nasal polyps).  Tap over the affected sinus to check for signs of infection.  View the inside of your sinuses (endoscopy) using an imaging device that has a light attached (endoscope). If your health care provider suspects that you have chronic sinusitis, one or more of the following tests may be recommended:  Allergy tests.  Nasal culture. A sample of mucus is taken from your nose, sent to a lab, and screened for bacteria.  Nasal cytology. A sample of mucus is taken from your nose and examined by your health care provider to determine if your sinusitis is related to an allergy. TREATMENT  Most cases of acute sinusitis are related to a viral infection and will resolve on their own within 10 days. Sometimes medicines are prescribed to help relieve symptoms (pain medicine, decongestants, nasal steroid sprays, or saline sprays).  However, for sinusitis related to a bacterial infection, your health care provider will prescribe antibiotic medicines. These are medicines that will help kill the bacteria causing the infection.  Rarely, sinusitis is caused by a fungal infection. In theses cases, your health care provider will prescribe antifungal medicine. For some cases of chronic sinusitis, surgery is needed. Generally, these are cases in which sinusitis recurs more than 3 times per year, despite other treatments. HOME CARE INSTRUCTIONS   Drink plenty of water. Water helps thin the mucus so your sinuses can drain more easily.  Use a humidifier.  Inhale steam 3 to 4 times a day (for example, sit in the bathroom with the shower running).  Apply a warm, moist  washcloth to your face 3 to 4 times a day, or as directed by your health care provider.  Use saline nasal sprays to help moisten and clean your sinuses.  Take medicines only as directed by your health care provider.  If you were prescribed either an antibiotic or antifungal medicine, finish it all even if you start  to feel better. SEEK IMMEDIATE MEDICAL CARE IF:  You have increasing pain or severe headaches.  You have nausea, vomiting, or drowsiness.  You have swelling around your face.  You have vision problems.  You have a stiff neck.  You have difficulty breathing. MAKE SURE YOU:   Understand these instructions.  Will watch your condition.  Will get help right away if you are not doing well or get worse. Document Released: 12/25/2004 Document Revised: 05/11/2013 Document Reviewed: 01/09/2011 [facility] Patient Information 2015 [place], [place]. This information is not intended to replace advice given to you by your health care provider. Make sure you discuss any questions you have with your health care provider.

## 2014-02-22 NOTE — Progress Notes (Signed)
Patient ID: Steve [name], male   DOB: 1940/01/13, 74 y.o.   MRN: [id number]   Location:  [facility]  Code Status: full code  Allergies  Allergen Reactions  . Morphine And Related Hives    Chief Complaint  Patient presents with  . Acute Visit    ?sinus infection  x 5 days    HPI: Patient is a 74 y.o. [place] male seen in the office today for acute visit due to possible sinus infection for 5 days.  He normally sees Dr. Bubba Camp.  He has COPD and lung cancer. He is afebrile at present.  Had a collapsed lung.  Says he's about to fall apart.  Has bad neuropathy in feet and hands--feel cold all of the time.  Has myopathy.  Had been taking oxycodone and it made him feel too badly.    Has had sinus headache about 5 days.  Cannot sleep, cannot function.  Is taking sudafed every 12 hrs.  Also taking excedrin migraine since yesterday 1-2 per 24 hrs.  Eyeballs are hurting.  Had one like this one other time.  Has had some phlegm with coughing and congestion, but that's been ongoing for some time--sees lung doctor about right lower lobe.  A little pressure in the ears and they are popping.   Chemo did not help.  Is on immune therapy every two weeks now.  He feels it is working b/c the tumor had shrunk in size on last CT.   Had MRI not long ago that showed no metastasis.    Review of Systems:  Review of Systems  Constitutional: Positive for malaise/fatigue. Negative for fever and chills.  HENT: Negative for congestion and sore throat.        Ear pressure  Respiratory: Positive for cough, sputum production, shortness of breath and wheezing. Negative for hemoptysis.   Cardiovascular: Negative for chest pain and leg swelling.  Musculoskeletal: Positive for myalgias.  Neurological: Positive for sensory change and headaches.    Past Medical History  Diagnosis Date  . Dizziness and giddiness     positional vertigo  . Hyperlipidemia   . Impotence of organic  origin   . Osteoarthritis, shoulder   . Allergy   . Hypothyroidism     pt denies thryoid disease, dr pandey note 11-19-12 says subclinical hypothryoid in epic  . COPD (chronic obstructive pulmonary disease)   . Insomnia   . Hx of radiation therapy 02/2013    right lung  . Diabetes mellitus without complication     pt denies diabetes, dr pandey note says dm 11-19-12 epic  . 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 Malignant neoplasm of bladder, part unspecified   . Malignant neoplasm of bronchus and lung, unspecified site     Past Surgical History  Procedure Laterality Date  . Cystoscopy w/ dilation of bladder    . Tonsillectomy  age 59  . Endobronchial ultrasound Bilateral 12/15/2012    Procedure: ENDOBRONCHIAL ULTRASOUND;  Surgeon: Brand Males, MD;  Location: [facility];  Service: Cardiopulmonary;  Laterality: Bilateral;  . Colonoscopy  2009    [facility]    Social History:   reports that he quit smoking about 7 years ago. His smoking use included Cigarettes. He has a 75 pack-year smoking history. His smokeless tobacco use includes Chew. He reports that he drinks alcohol. He reports that he does not use illicit drugs.  Family History  Problem Relation  Age of Onset  . Kidney disease Brother     Medications: Patient's Medications  New Prescriptions   No medications on file  Previous Medications   ASPIRIN 81 MG TABLET    Take 81 mg by mouth daily.   DULOXETINE (CYMBALTA) 60 MG CAPSULE    TAKE 1 CAPSULE BY MOUTH EVERY DAY   FLECAINIDE (TAMBOCOR) 150 MG TABLET    Take 0.5 tablets (75 mg total) by mouth 2 (two) times daily.   GABAPENTIN (NEURONTIN) 300 MG CAPSULE    TAKE 1 CAPSULE BY MOUTH THREE TIMES DAILY   METOPROLOL TARTRATE (LOPRESSOR) 25 MG TABLET    Take 1 tablet (25 mg total) by mouth 2 (two) times daily.   MIDODRINE (PROAMATINE) 2.5 MG TABLET    Take 1 tablet (2.5 mg total) by mouth daily.   OXYCODONE-ACETAMINOPHEN (ROXICET) 5-325 MG PER TABLET     Take 1 tablet by mouth every 6 (six) hours as needed for severe pain.   SIMVASTATIN (ZOCOR) 20 MG TABLET    Take one tablet by mouth once daily to lower cholesterol   ZOLPIDEM (AMBIEN) 10 MG TABLET    Take one tablet by mouth once daily at bedtime for rest  Modified Medications   No medications on file  Discontinued Medications   No medications on file     Physical Exam: Filed Vitals:   02/22/14 1139  BP: 106/68  Pulse: 88  Temp: 97.4 F (36.3 C)  TempSrc: Oral  Resp: 18  Height: 6' (1.829 m)  Weight: 176 lb (79.833 kg)  SpO2: 94%  Physical Exam  Constitutional: He is oriented to person, place, and time. He appears well-developed and well-nourished. No distress.  HENT:  Head: Normocephalic and atraumatic.  Right Ear: External ear normal.  Left Ear: External ear normal.  Nose: Nose normal.  Mouth/Throat: Oropharynx is clear and moist. No oropharyngeal exudate.  Tenderness over bilateral maxillary and frontal sinuses; left TM dull vs. right  Cardiovascular: Normal rate, regular rhythm and normal heart sounds.   Pulmonary/Chest: Effort normal.  Right base with diminished breath sounds and some crackles present  Musculoskeletal: Normal range of motion.  Neurological: He is alert and oriented to person, place, and time.  Skin: Skin is warm and dry.  Psychiatric: He has a normal mood and affect.    Labs reviewed: Basic Metabolic Panel:  Recent Labs  04/28/13 0819  11/16/13 0755  12/15/13 1101  01/12/14 1125 01/26/14 1346 02/09/14 1021  NA 142  < > 139  < >  --   < > 138 140 140  K 5.1  < > 4.1  < >  --   < > 4.2 4.2 4.7  CL 103  --  100  --   --   --   --   --   --   CO2 24  < > 23  < >  --   < > 27 24 26   GLUCOSE 103*  < > 101*  < >  --   < > 122 91 135  BUN 12  < > 12  < >  --   < > 20.1 21.1 22.0  CREATININE 0.87  < > 0.73  < >  --   < > 0.8 0.8 1.1  CALCIUM 9.9  < > 10.0  < >  --   < > 9.5 9.4 9.5  TSH 3.140  < >  --   < > 1.461  --  2.062  --  2.136  < > =  values in this interval not displayed. Liver Function Tests:  Recent Labs  01/12/14 1125 01/26/14 1346 01/28/14 1725 02/09/14 1021  AST 24 31  --  20  ALT 24 30  --  23  ALKPHOS 87 93  --  90  BILITOT 0.33 0.29  --  0.24  PROT 7.6 8.0 8.7* 7.6  ALBUMIN 3.1* 3.4* 4.2 3.3*    Recent Labs  01/28/14 1725  LIPASE 59.0   No results for input(s): AMMONIA in the last 8760 hours. CBC:  Recent Labs  01/12/14 1125 01/26/14 1346 01/28/14 1725 02/09/14 1021  WBC 9.5 11.2* 11.2* 9.5  NEUTROABS 7.2* 7.8*  --  7.3*  HGB 11.7* 11.9* 12.4* 11.9*  HCT 37.3* 37.5* 37.8* 38.4  MCV 82.6 83.3 80.8 84.2  PLT 601* 529* 624.0* 529*   Lipid Panel:  Recent Labs  04/28/13 0819  HDL 37*  LDLCALC 110*  TRIG 167*  CHOLHDL 4.9   Lab Results  Component Value Date   HGBA1C 6.1* 04/28/2013    Assessment/Plan 1. Acute frontal sinusitis, recurrence not specified - given instructions -discussed using nettipot, warm humidity, plenty of rest and adequate fluids, sudafed for headache (bp has tolerated fine based on this am reading) - doxycycline (VIBRA-TABS) 100 MG tablet; Take 1 tablet (100 mg total) by mouth 2 (two) times daily.  Dispense: 20 tablet; Refill: 0 prescribed promptly due to immunocompromised state -to take yogurt daily with the doxycycline -primarily has frontal and maxillary headache (normal MRI in past)  2. Recurrent right pleural effusion -has had two thoracocenteses done; continues with some wheezing and diminished breath sounds at right base  3. Malignant neoplasm of lung, unspecified laterality, unspecified part of lung -cont current nivolumab every two weeks per oncology  Labs/tests ordered:  No new today Next appt:  Keep regular visit  Kerrianne Jeng L. Moira Umholtz, D.O. [facility] [address], [place] [address] Cell Phone (Mon-Fri 8am-5pm):  [phone] On Call:  [phone] & follow prompts after 5pm &  weekends Office Phone:  [phone] Office Fax:  [phone]

## 2014-02-23 ENCOUNTER — Telehealth: Payer: Self-pay | Admitting: *Deleted

## 2014-02-23 ENCOUNTER — Other Ambulatory Visit (HOSPITAL_BASED_OUTPATIENT_CLINIC_OR_DEPARTMENT_OTHER): Payer: Medicare HMO

## 2014-02-23 ENCOUNTER — Encounter: Payer: Self-pay | Admitting: Internal Medicine

## 2014-02-23 ENCOUNTER — Telehealth: Payer: Self-pay | Admitting: Internal Medicine

## 2014-02-23 ENCOUNTER — Ambulatory Visit (HOSPITAL_BASED_OUTPATIENT_CLINIC_OR_DEPARTMENT_OTHER): Payer: Medicare HMO

## 2014-02-23 ENCOUNTER — Ambulatory Visit (HOSPITAL_BASED_OUTPATIENT_CLINIC_OR_DEPARTMENT_OTHER): Payer: Medicare HMO | Admitting: Internal Medicine

## 2014-02-23 ENCOUNTER — Ambulatory Visit: Payer: Medicare HMO

## 2014-02-23 VITALS — BP 123/73 | HR 92 | Temp 97.7°F | Resp 18 | Ht 72.0 in | Wt 177.9 lb

## 2014-02-23 DIAGNOSIS — J948 Other specified pleural conditions: Secondary | ICD-10-CM

## 2014-02-23 DIAGNOSIS — Z5112 Encounter for antineoplastic immunotherapy: Secondary | ICD-10-CM

## 2014-02-23 DIAGNOSIS — C3431 Malignant neoplasm of lower lobe, right bronchus or lung: Secondary | ICD-10-CM

## 2014-02-23 DIAGNOSIS — J90 Pleural effusion, not elsewhere classified: Secondary | ICD-10-CM

## 2014-02-23 DIAGNOSIS — Z95828 Presence of other vascular implants and grafts: Secondary | ICD-10-CM

## 2014-02-23 LAB — COMPREHENSIVE METABOLIC PANEL (CC13)
ALK PHOS: 88 U/L (ref 40–150)
ALT: 29 U/L (ref 0–55)
ANION GAP: 10 meq/L (ref 3–11)
AST: 23 U/L (ref 5–34)
Albumin: 3.3 g/dL — ABNORMAL LOW (ref 3.5–5.0)
BUN: 17.9 mg/dL (ref 7.0–26.0)
CHLORIDE: 103 meq/L (ref 98–109)
CO2: 27 meq/L (ref 22–29)
Calcium: 9.9 mg/dL (ref 8.4–10.4)
Creatinine: 0.8 mg/dL (ref 0.7–1.3)
EGFR: 87 mL/min/{1.73_m2} — AB (ref >90)
GLUCOSE: 132 mg/dL (ref 70–140)
Potassium: 4.5 mEq/L (ref 3.5–5.1)
SODIUM: 140 meq/L (ref 136–145)
Total Bilirubin: 0.31 mg/dL (ref 0.20–1.20)
Total Protein: 7.4 g/dL (ref 6.4–8.3)

## 2014-02-23 LAB — CBC WITH DIFFERENTIAL/PLATELET
BASO%: 0.4 % (ref 0.0–2.0)
BASOS ABS: 0 10*3/uL (ref 0.0–0.1)
EOS%: 1.7 % (ref 0.0–7.0)
Eosinophils Absolute: 0.2 10*3/uL (ref 0.0–0.5)
HCT: 39.1 % (ref 38.4–49.9)
HGB: 12.1 g/dL — ABNORMAL LOW (ref 13.0–17.1)
LYMPH#: 1.1 10*3/uL (ref 0.9–3.3)
LYMPH%: 9.8 % — ABNORMAL LOW (ref 14.0–49.0)
MCH: 26 pg — ABNORMAL LOW (ref 27.2–33.4)
MCHC: 30.9 g/dL — AB (ref 32.0–36.0)
MCV: 84.1 fL (ref 79.3–98.0)
MONO#: 1 10*3/uL — ABNORMAL HIGH (ref 0.1–0.9)
MONO%: 9.2 % (ref 0.0–14.0)
NEUT%: 78.9 % — ABNORMAL HIGH (ref 39.0–75.0)
NEUTROS ABS: 8.6 10*3/uL — AB (ref 1.5–6.5)
PLATELETS: 429 10*3/uL — AB (ref 140–400)
RBC: 4.65 10*6/uL (ref 4.20–5.82)
RDW: 17.1 % — ABNORMAL HIGH (ref 11.0–14.6)
WBC: 10.9 10*3/uL — ABNORMAL HIGH (ref 4.0–10.3)

## 2014-02-23 MED ORDER — SODIUM CHLORIDE 0.9 % IJ SOLN
10.0000 mL | INTRAMUSCULAR | Status: DC | PRN
  Filled 2014-02-23: qty 10

## 2014-02-23 MED ORDER — NIVOLUMAB CHEMO INJECTION 100 MG/10ML
3.0000 mg/kg | Freq: Once | INTRAVENOUS | Status: AC
  Administered 2014-02-23: 240 mg via INTRAVENOUS
  Filled 2014-02-23: qty 24

## 2014-02-23 MED ORDER — HEPARIN SOD (PORK) LOCK FLUSH 100 UNIT/ML IV SOLN
500.0000 [IU] | Freq: Once | INTRAVENOUS | Status: AC | PRN
  Administered 2014-02-23: 500 [IU]
  Filled 2014-02-23: qty 5

## 2014-02-23 MED ORDER — SODIUM CHLORIDE 0.9 % IV SOLN
Freq: Once | INTRAVENOUS | Status: AC
  Administered 2014-02-23: 11:00:00 via INTRAVENOUS

## 2014-02-23 MED ORDER — SODIUM CHLORIDE 0.9 % IJ SOLN
10.0000 mL | INTRAMUSCULAR | Status: DC | PRN
  Administered 2014-02-23 (×2): 10 mL via INTRAVENOUS
  Filled 2014-02-23: qty 10

## 2014-02-23 NOTE — Progress Notes (Signed)
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   Primary site: Lung (Right)  Staging method: AJCC 7th Edition  Clinical: Stage IIIA (T2b, N2, M0)  Summary: Stage IIIA (T2b, N2, M0)   PRIOR THERAPY:  1) Concurrent chemoradiation with weekly carboplatin for an AUC of 2 and paclitaxel 45 mg/m2, status post 4 cycles. 2) Consolidation chemotherapy with carboplatin for AUC of 5 and paclitaxel 175 mg/M2 every 3 weeks with Neulasta support, first cycle on 03/23/2013. Status post 3 cycles.  CURRENT THERAPY: Immunotherapy with Nivolumab 3 mg/KG every 2 weeks. First dose 09/21/2013. Status post 10 cycles.  DISEASE STAGE:  Lung cancer  Primary site: Lung (Right)  Staging method: AJCC 7th Edition  Clinical: Stage IIIA (T2b, N2, M0)  Summary: Stage IIIA (T2b, N2, M0)  CHEMOTHERAPY INTENT: Control/Palliative  CURRENT # OF CHEMOTHERAPY CYCLES: 11 CURRENT ANTIEMETICS: Zofran, dexamethasone, Compazine  CURRENT SMOKING STATUS: Former smoker, quit 01/09/2007  ORAL CHEMOTHERAPY AND CONSENT: n/a  CURRENT BISPHOSPHONATES USE: none  PAIN MANAGEMENT: Ibuprofen  NARCOTICS INDUCED CONSTIPATION: None  LIVING WILL AND CODE STATUS: ?  INTERVAL HISTORY: Steve [name] 74 y.o. [name] returns to the clinic today for followup visit. He is tolerating his treatment with Nivolumab fairly well with no significant adverse effects. He recently underwent right sided thoracentesis with drainage of 500 ml of clear yellow fluid and the final cytology was unremarkable for any malignancy. He denied having any significant chest pain but continues to have shortness of breath with exertion with mild  cough without hemoptysis He denied having any weight loss or night sweats. The patient has no fever or chills and no nausea or vomiting. He is here today to proceed with cycle #11 of his immunotherapy.  MEDICAL HISTORY: Past Medical History  Diagnosis Date  . Dizziness and giddiness     positional vertigo  . Hyperlipidemia   . Impotence of organic origin   . Osteoarthritis, shoulder   . Allergy   . Hypothyroidism     pt denies thryoid disease, dr pandey note 11-19-12 says subclinical hypothryoid in epic  . COPD (chronic obstructive pulmonary disease)   . Insomnia   . Hx of radiation therapy 02/2013    right lung  . Diabetes mellitus without complication     pt denies diabetes, dr pandey note says dm 11-19-12 epic  . 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 Malignant neoplasm of bladder, part unspecified   . Malignant neoplasm of bronchus and lung, unspecified site     ALLERGIES:  is allergic to morphine and related.  MEDICATIONS:  Current Outpatient Prescriptions  Medication Sig Dispense Refill  . aspirin 81 MG tablet Take 81 mg by mouth daily.    Marland Kitchen doxycycline (VIBRA-TABS) 100 MG tablet Take 1 tablet (100 mg total) by mouth 2 (two) times daily. 20 tablet 0  . DULoxetine (CYMBALTA) 60 MG capsule TAKE 1 CAPSULE BY MOUTH EVERY DAY 90 capsule 0  . flecainide (TAMBOCOR) 150 MG tablet Take 0.5 tablets (75 mg total) by mouth 2 (two) times daily. 90 tablet 1  . gabapentin (NEURONTIN) 300 MG capsule TAKE 1 CAPSULE BY MOUTH THREE TIMES DAILY (Patient taking differently: TAKE  1 CAPSULE BY MOUTH TWICE DAILY) 270 capsule 0  . metoprolol tartrate (LOPRESSOR) 25 MG tablet Take 1 tablet (25 mg total) by mouth 2 (two) times daily. 90 tablet 1  . midodrine (PROAMATINE) 2.5 MG tablet Take 1 tablet (2.5 mg total) by mouth daily. 90 tablet 5  . simvastatin (ZOCOR) 20 MG tablet Take one tablet by mouth once daily to lower cholesterol 90 tablet 3  . zolpidem (AMBIEN) 10 MG tablet Take  one tablet by mouth once daily at bedtime for rest 90 tablet 0  . oxyCODONE-acetaminophen (ROXICET) 5-325 MG per tablet Take 1 tablet by mouth every 6 (six) hours as needed for severe pain. (Patient not taking: Reported on 02/22/2014) 120 tablet 0   No current facility-administered medications for this visit.   Facility-Administered Medications Ordered in Other Visits  Medication Dose Route Frequency Provider Last Rate Last Dose  . sodium chloride 0.9 % injection 10 mL  10 mL Intravenous PRN Curt Bears, MD   10 mL at 02/23/14 0947    SURGICAL HISTORY:  Past Surgical History  Procedure Laterality Date  . Cystoscopy w/ dilation of bladder    . Tonsillectomy  age 62  . Endobronchial ultrasound Bilateral 12/15/2012    Procedure: ENDOBRONCHIAL ULTRASOUND;  Surgeon: Brand Males, MD;  Location: [facility];  Service: Cardiopulmonary;  Laterality: Bilateral;  . Colonoscopy  2009    [facility]    REVIEW OF SYSTEMS:  Constitutional: negative Eyes: negative Ears, nose, mouth, throat, and face: negative Respiratory: positive for cough, dyspnea on exertion and wheezing Cardiovascular: negative Gastrointestinal: negative Genitourinary:negative Integument/breast: negative Hematologic/lymphatic: negative Musculoskeletal:negative Neurological: positive for paresthesia Behavioral/Psych: negative Endocrine: negative Allergic/Immunologic: negative   PHYSICAL EXAMINATION: General appearance: alert, cooperative and no distress Head: Normocephalic, without obvious abnormality, atraumatic Neck: no adenopathy, no JVD, supple, symmetrical, trachea midline and thyroid not enlarged, symmetric, no tenderness/mass/nodules Lymph nodes: Cervical, supraclavicular, and axillary nodes normal. Resp: clear to auscultation bilaterally Back: symmetric, no curvature. ROM normal. No CVA tenderness. Cardio: regular rate and rhythm, S1, S2 normal, no murmur, click, rub or gallop GI: soft, non-tender; bowel  sounds normal; no masses,  no organomegaly Extremities: extremities normal, atraumatic, no cyanosis or edema Neurologic: Alert and oriented X 3, normal strength and tone. Normal symmetric reflexes. Normal coordination and gait  ECOG PERFORMANCE STATUS: 1 - Symptomatic but completely ambulatory  Blood pressure 123/73, pulse 92, temperature 97.7 F (36.5 C), temperature source Oral, resp. rate 18, height 6' (1.829 m), weight 177 lb 14.4 oz (80.695 kg), SpO2 95 %.  LABORATORY DATA: Lab Results  Component Value Date   WBC 10.9* 02/23/2014   HGB 12.1* 02/23/2014   HCT 39.1 02/23/2014   MCV 84.1 02/23/2014   PLT 429* 02/23/2014      Chemistry      Component Value Date/Time   NA 140 02/09/2014 1021   NA 139 11/16/2013 0755   NA 142 04/28/2013 0819   K 4.7 02/09/2014 1021   K 4.1 11/16/2013 0755   CL 100 11/16/2013 0755   CO2 26 02/09/2014 1021   CO2 23 11/16/2013 0755   BUN 22.0 02/09/2014 1021   BUN 12 11/16/2013 0755   BUN 12 04/28/2013 0819   CREATININE 1.1 02/09/2014 1021   CREATININE 0.73 11/16/2013 0755      Component Value Date/Time   CALCIUM 9.5 02/09/2014 1021   CALCIUM 10.0 11/16/2013 0755   ALKPHOS 90 02/09/2014 1021   ALKPHOS 107 04/28/2013 0819   AST 20 02/09/2014 1021  AST 19 04/28/2013 0819   ALT 23 02/09/2014 1021   ALT 12 04/28/2013 0819   BILITOT 0.24 02/09/2014 1021   BILITOT 0.4 04/28/2013 0819       RADIOGRAPHIC STUDIES:  ASSESSMENT AND PLAN: This is a very pleasant 74 years old Steve [name] with:   1) metastatic non-small cell lung cancer initially diagnosed as stage IIIA non-small cell lung cancer who completed a course of concurrent chemoradiation with weekly carboplatin and paclitaxel with no significant complaints except for radiation induced odynophagia.  He also completed 3 cycles of consolidation chemotherapy with reduced dose carboplatin and paclitaxel. He is currently undergoing immunotherapy with Nivolumab status post  10 cycles. He is  tolerating this treatment fairly well. We will proceed with cycle # 11 today as a scheduled.   2) right sided pleural effusion: Status post right thoracentesis. He did not notice any significant improvement in his shortness of breath after the procedure. The final cytology of the pleural fluid was negative for malignancy. We will continue to monitor this on upcoming scan.  He would come back for followup visit in 2 weeks before the next cycle of his treatment for reevaluation before the start of cycle #12 of his treatment.  He was advised to call immediately if he has any concerning symptoms in the interval.  The patient voices understanding of current disease status and treatment options and is in agreement with the current care plan.  All questions were answered. The patient knows to call the clinic with any problems, questions or concerns. We can certainly see the patient much sooner if necessary.  Disclaimer: This note was dictated with voice recognition software. Similar sounding words can inadvertently be transcribed and may not be corrected upon review.

## 2014-02-23 NOTE — Telephone Encounter (Signed)
Gave avs & calendar for  March. Sent message to schedule treatment

## 2014-02-23 NOTE — Telephone Encounter (Signed)
Per staff message and POF I have scheduled appts. Advised scheduler of appts. JMW  

## 2014-02-23 NOTE — Patient Instructions (Signed)

## 2014-02-23 NOTE — Patient Instructions (Signed)
[facility] Discharge Instructions for Patients Receiving Chemotherapy  Today you received the following chemotherapy agents Opdivo  To help prevent nausea and vomiting after your treatment, we encourage you to take your nausea medication as directed/prescribed   If you develop nausea and vomiting that is not controlled by your nausea medication, call the clinic.   BELOW ARE SYMPTOMS THAT SHOULD BE REPORTED IMMEDIATELY:  *FEVER GREATER THAN 100.5 F  *CHILLS WITH OR WITHOUT FEVER  NAUSEA AND VOMITING THAT IS NOT CONTROLLED WITH YOUR NAUSEA MEDICATION  *UNUSUAL SHORTNESS OF BREATH  *UNUSUAL BRUISING OR BLEEDING  TENDERNESS IN MOUTH AND THROAT WITH OR WITHOUT PRESENCE OF ULCERS  *URINARY PROBLEMS  *BOWEL PROBLEMS  UNUSUAL RASH Items with * indicate a potential emergency and should be followed up as soon as possible.  Feel free to call the clinic you have any questions or concerns. The clinic phone number is [phone].

## 2014-02-25 ENCOUNTER — Telehealth: Payer: Self-pay | Admitting: Internal Medicine

## 2014-02-25 NOTE — Telephone Encounter (Signed)
Carlene Coria to Dr Julien Nordmann. 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 He has seen patient since I saw patient and patient subjectively better. So, Dr Julien Nordmann and I agreed to watch situation. Dr Julien Nordmann will send patient back of ILD worse at fu CT or patient gets symptomatic. No active fu  Thanks  Dr. Brand Males, M.D., [facility].C.P Pulmonary and Critical Care Medicine Staff Physician [facility] Pager: [phone], If no answer or between  15:00h - 7:00h: call [phone]  02/25/2014 5:42 PM

## 2014-02-26 NOTE — Telephone Encounter (Signed)
Called and spoke to pt. Informed pt of the recs per MR. Pt verbalized understanding and denied any further questions or concerns at this time.  

## 2014-03-04 ENCOUNTER — Telehealth: Payer: Self-pay | Admitting: Internal Medicine

## 2014-03-04 NOTE — Telephone Encounter (Signed)
Patient called for Medication refill for Oxycodone.  Patient asked Steve [name] to call...[facility]

## 2014-03-05 ENCOUNTER — Other Ambulatory Visit: Payer: Self-pay | Admitting: *Deleted

## 2014-03-05 DIAGNOSIS — G894 Chronic pain syndrome: Secondary | ICD-10-CM

## 2014-03-05 MED ORDER — OXYCODONE-ACETAMINOPHEN 5-325 MG PO TABS
1.0000 | ORAL_TABLET | Freq: Four times a day (QID) | ORAL | Status: DC | PRN
Start: 2014-03-05 — End: 2014-05-10

## 2014-03-05 NOTE — Telephone Encounter (Signed)
Patient stated that he saw you recently for Sinus infection and he stated that he took the antibiotic and finished last night. Patient stated that it is better but he still has a slight sinus headache. Told him that if it is no better by Monday to call us and he agreed.

## 2014-03-05 NOTE — Telephone Encounter (Signed)
Patient Requested and will pick up 

## 2014-03-05 NOTE — Telephone Encounter (Signed)
Printed Rx for medication and patient Notified for pick up

## 2014-03-08 ENCOUNTER — Encounter: Payer: Self-pay | Admitting: Internal Medicine

## 2014-03-09 ENCOUNTER — Other Ambulatory Visit (HOSPITAL_BASED_OUTPATIENT_CLINIC_OR_DEPARTMENT_OTHER): Payer: Medicare HMO

## 2014-03-09 ENCOUNTER — Ambulatory Visit: Payer: Medicare HMO

## 2014-03-09 ENCOUNTER — Ambulatory Visit (HOSPITAL_BASED_OUTPATIENT_CLINIC_OR_DEPARTMENT_OTHER): Payer: Medicare HMO

## 2014-03-09 ENCOUNTER — Telehealth: Payer: Self-pay | Admitting: Physician Assistant

## 2014-03-09 ENCOUNTER — Encounter: Payer: Self-pay | Admitting: Physician Assistant

## 2014-03-09 ENCOUNTER — Ambulatory Visit (HOSPITAL_BASED_OUTPATIENT_CLINIC_OR_DEPARTMENT_OTHER): Payer: Medicare HMO | Admitting: Physician Assistant

## 2014-03-09 VITALS — BP 124/78 | HR 98 | Temp 98.1°F | Resp 18 | Ht 72.0 in | Wt 180.5 lb

## 2014-03-09 DIAGNOSIS — Z79899 Other long term (current) drug therapy: Secondary | ICD-10-CM

## 2014-03-09 DIAGNOSIS — R5383 Other fatigue: Secondary | ICD-10-CM

## 2014-03-09 DIAGNOSIS — G4700 Insomnia, unspecified: Secondary | ICD-10-CM

## 2014-03-09 DIAGNOSIS — C3431 Malignant neoplasm of lower lobe, right bronchus or lung: Secondary | ICD-10-CM

## 2014-03-09 DIAGNOSIS — Z5112 Encounter for antineoplastic immunotherapy: Secondary | ICD-10-CM

## 2014-03-09 DIAGNOSIS — Z95828 Presence of other vascular implants and grafts: Secondary | ICD-10-CM

## 2014-03-09 DIAGNOSIS — J90 Pleural effusion, not elsewhere classified: Secondary | ICD-10-CM

## 2014-03-09 LAB — COMPREHENSIVE METABOLIC PANEL (CC13)
ALBUMIN: 3.4 g/dL — AB (ref 3.5–5.0)
ALT: 23 U/L (ref 0–55)
ANION GAP: 12 meq/L — AB (ref 3–11)
AST: 21 U/L (ref 5–34)
Alkaline Phosphatase: 88 U/L (ref 40–150)
BILIRUBIN TOTAL: 0.24 mg/dL (ref 0.20–1.20)
BUN: 20 mg/dL (ref 7.0–26.0)
CALCIUM: 9.8 mg/dL (ref 8.4–10.4)
CO2: 26 meq/L (ref 22–29)
CREATININE: 0.8 mg/dL (ref 0.7–1.3)
Chloride: 105 mEq/L (ref 98–109)
EGFR: 88 mL/min/{1.73_m2} — ABNORMAL LOW (ref >90)
Glucose: 137 mg/dl (ref 70–140)
Potassium: 4.3 mEq/L (ref 3.5–5.1)
Sodium: 143 mEq/L (ref 136–145)
Total Protein: 7.3 g/dL (ref 6.4–8.3)

## 2014-03-09 LAB — CBC WITH DIFFERENTIAL/PLATELET
BASO%: 0.8 % (ref 0.0–2.0)
BASOS ABS: 0.1 10*3/uL (ref 0.0–0.1)
EOS%: 2.4 % (ref 0.0–7.0)
Eosinophils Absolute: 0.2 10*3/uL (ref 0.0–0.5)
HCT: 37.9 % — ABNORMAL LOW (ref 38.4–49.9)
HGB: 12 g/dL — ABNORMAL LOW (ref 13.0–17.1)
LYMPH#: 1.2 10*3/uL (ref 0.9–3.3)
LYMPH%: 14.3 % (ref 14.0–49.0)
MCH: 26.3 pg — AB (ref 27.2–33.4)
MCHC: 31.6 g/dL — AB (ref 32.0–36.0)
MCV: 83.3 fL (ref 79.3–98.0)
MONO#: 0.8 10*3/uL (ref 0.1–0.9)
MONO%: 9.5 % (ref 0.0–14.0)
NEUT#: 6.2 10*3/uL (ref 1.5–6.5)
NEUT%: 73 % (ref 39.0–75.0)
Platelets: 462 10*3/uL — ABNORMAL HIGH (ref 140–400)
RBC: 4.55 10*6/uL (ref 4.20–5.82)
RDW: 18.2 % — ABNORMAL HIGH (ref 11.0–14.6)
WBC: 8.5 10*3/uL (ref 4.0–10.3)

## 2014-03-09 LAB — TSH CHCC: TSH: 2.092 m(IU)/L (ref 0.320–4.118)

## 2014-03-09 MED ORDER — SODIUM CHLORIDE 0.9 % IJ SOLN
10.0000 mL | INTRAMUSCULAR | Status: DC | PRN
  Administered 2014-03-09: 10 mL via INTRAVENOUS
  Filled 2014-03-09: qty 10

## 2014-03-09 MED ORDER — SODIUM CHLORIDE 0.9 % IJ SOLN
10.0000 mL | INTRAMUSCULAR | Status: DC | PRN
  Administered 2014-03-09: 10 mL
  Filled 2014-03-09: qty 10

## 2014-03-09 MED ORDER — HEPARIN SOD (PORK) LOCK FLUSH 100 UNIT/ML IV SOLN
500.0000 [IU] | Freq: Once | INTRAVENOUS | Status: AC | PRN
  Administered 2014-03-09: 500 [IU]
  Filled 2014-03-09: qty 5

## 2014-03-09 MED ORDER — NIVOLUMAB CHEMO INJECTION 100 MG/10ML
3.0000 mg/kg | Freq: Once | INTRAVENOUS | Status: AC
  Administered 2014-03-09: 240 mg via INTRAVENOUS
  Filled 2014-03-09: qty 24

## 2014-03-09 MED ORDER — SODIUM CHLORIDE 0.9 % IV SOLN
Freq: Once | INTRAVENOUS | Status: AC
  Administered 2014-03-09: 15:00:00 via INTRAVENOUS

## 2014-03-09 NOTE — Patient Instructions (Signed)
[facility] Discharge Instructions for Patients Receiving Chemotherapy  Today you received the following chemotherapy agents: Nivolumab.  To help prevent nausea and vomiting after your treatment, we encourage you to take your nausea medication: as directed.   If you develop nausea and vomiting that is not controlled by your nausea medication, call the clinic.   BELOW ARE SYMPTOMS THAT SHOULD BE REPORTED IMMEDIATELY:  *FEVER GREATER THAN 100.5 F  *CHILLS WITH OR WITHOUT FEVER  NAUSEA AND VOMITING THAT IS NOT CONTROLLED WITH YOUR NAUSEA MEDICATION  *UNUSUAL SHORTNESS OF BREATH  *UNUSUAL BRUISING OR BLEEDING  TENDERNESS IN MOUTH AND THROAT WITH OR WITHOUT PRESENCE OF ULCERS  *URINARY PROBLEMS  *BOWEL PROBLEMS  UNUSUAL RASH Items with * indicate a potential emergency and should be followed up as soon as possible.  Feel free to call the clinic you have any questions or concerns. The clinic phone number is [phone].

## 2014-03-09 NOTE — Patient Instructions (Signed)

## 2014-03-09 NOTE — Telephone Encounter (Signed)
Pt already has appts in place and aware that he will get a call with his scan appt  anne

## 2014-03-09 NOTE — Progress Notes (Addendum)
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   Primary site: Lung (Right)  Staging method: AJCC 7th Edition  Clinical: Stage IIIA (T2b, N2, M0)  Summary: Stage IIIA (T2b, N2, M0)   PRIOR THERAPY:  1) Concurrent chemoradiation with weekly carboplatin for an AUC of 2 and paclitaxel 45 mg/m2, status post 4 cycles. 2) Consolidation chemotherapy with carboplatin for AUC of 5 and paclitaxel 175 mg/M2 every 3 weeks with Neulasta support, first cycle on 03/23/2013. Status post 3 cycles.  CURRENT THERAPY: Immunotherapy with Nivolumab 3 mg/KG every 2 weeks. First dose 09/21/2013. Status post 11 cycles.  DISEASE STAGE:  Lung cancer  Primary site: Lung (Right)  Staging method: AJCC 7th Edition  Clinical: Stage IIIA (T2b, N2, M0)  Summary: Stage IIIA (T2b, N2, M0)  CHEMOTHERAPY INTENT: Control/Palliative  CURRENT # OF CHEMOTHERAPY CYCLES: 12 CURRENT ANTIEMETICS: Zofran, dexamethasone, Compazine  CURRENT SMOKING STATUS: Former smoker, quit 01/09/2007  ORAL CHEMOTHERAPY AND CONSENT: n/a  CURRENT BISPHOSPHONATES USE: none  PAIN MANAGEMENT: Ibuprofen  NARCOTICS INDUCED CONSTIPATION: None  LIVING WILL AND CODE STATUS: ?  INTERVAL HISTORY: DMARIUS [name] 74 y.o. male returns to the clinic today for followup visit. He is tolerating his treatment with Nivolumab fairly well with no significant adverse effects. He recently underwent right sided thoracentesis with drainage of 500 ml of clear yellow fluid and the final cytology was unremarkable for any malignancy. He denied having any significant chest pain but continues to have shortness of breath with exertion with mild  cough without hemoptysis He denied having any weight loss or night sweats. The patient has no fever or chills and no nausea or vomiting.he complains of some muscle pain in his extremities but states that this is well managed with oxycodone and bedtime. He's been taking some ibuprofen intermittently. He also reports that his insurance will no longer cover his prescription for Ambien and he would like to resume Restoril 30 mg at bedtime for his insomnia with his next prescription. He is here today to proceed with cycle #12 of his immunotherapy.  MEDICAL HISTORY: Past Medical History  Diagnosis Date  . Dizziness and giddiness     positional vertigo  . Hyperlipidemia   . Impotence of organic origin   . Osteoarthritis, shoulder   . Allergy   . Hypothyroidism     pt denies thryoid disease, dr pandey note 11-19-12 says subclinical hypothryoid in epic  . COPD (chronic obstructive pulmonary disease)   . Insomnia   . Hx of radiation therapy 02/2013    right lung  . Diabetes mellitus without complication     pt denies diabetes, dr pandey note says dm 11-19-12 epic  . 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 Malignant neoplasm of bladder, part unspecified   . Malignant neoplasm of bronchus and lung, unspecified site     ALLERGIES:  is allergic to morphine and related.  MEDICATIONS:  Current Outpatient Prescriptions  Medication Sig Dispense Refill  . aspirin 81 MG tablet Take 81 mg by mouth daily.    . DULoxetine (CYMBALTA) 60 MG capsule TAKE 1 CAPSULE BY MOUTH EVERY DAY 90 capsule 0  . flecainide (TAMBOCOR) 150 MG  tablet Take 0.5 tablets (75 mg total) by mouth 2 (two) times daily. 90 tablet 1  . gabapentin (NEURONTIN) 300 MG capsule TAKE 1 CAPSULE BY MOUTH THREE TIMES DAILY (Patient taking differently: TAKE 1 CAPSULE BY MOUTH TWICE DAILY) 270 capsule 0  . metoprolol tartrate (LOPRESSOR) 25 MG tablet Take 1 tablet (25 mg total) by mouth 2 (two) times daily. 90 tablet 1  .  midodrine (PROAMATINE) 2.5 MG tablet Take 1 tablet (2.5 mg total) by mouth daily. 90 tablet 5  . oxyCODONE-acetaminophen (ROXICET) 5-325 MG per tablet Take 1 tablet by mouth every 6 (six) hours as needed for severe pain. 120 tablet 0  . simvastatin (ZOCOR) 20 MG tablet Take one tablet by mouth once daily to lower cholesterol 90 tablet 3  . zolpidem (AMBIEN) 10 MG tablet Take one tablet by mouth once daily at bedtime for rest 90 tablet 0   No current facility-administered medications for this visit.   Facility-Administered Medications Ordered in Other Visits  Medication Dose Route Frequency Provider Last Rate Last Dose  . heparin lock flush 100 unit/mL  500 Units Intracatheter Once PRN Curt Bears, MD      . nivolumab (OPDIVO) 240 mg in sodium chloride 0.9 % 100 mL chemo infusion  3 mg/kg (Treatment Plan Ideal) Intravenous Once Curt Bears, MD 124 mL/hr at 03/09/14 1530 240 mg at 03/09/14 1530  . sodium chloride 0.9 % injection 10 mL  10 mL Intracatheter PRN Curt Bears, MD        SURGICAL HISTORY:  Past Surgical History  Procedure Laterality Date  . Cystoscopy w/ dilation of bladder    . Tonsillectomy  age 3  . Endobronchial ultrasound Bilateral 12/15/2012    Procedure: ENDOBRONCHIAL ULTRASOUND;  Surgeon: Brand Males, MD;  Location: [facility];  Service: Cardiopulmonary;  Laterality: Bilateral;  . Colonoscopy  2009    [facility]    REVIEW OF SYSTEMS:  Constitutional: negative Eyes: negative Ears, nose, mouth, throat, and face: negative Respiratory: positive for cough and dyspnea on exertion Cardiovascular: negative Gastrointestinal: negative Genitourinary:negative Integument/breast: negative Hematologic/lymphatic: negative Musculoskeletal:positive for myalgias Neurological: positive for paresthesia Behavioral/Psych: negative Endocrine: negative Allergic/Immunologic: negative   PHYSICAL EXAMINATION: General appearance: alert, cooperative and no distress Head:  Normocephalic, without obvious abnormality, atraumatic Neck: no adenopathy, no JVD, supple, symmetrical, trachea midline and thyroid not enlarged, symmetric, no tenderness/mass/nodules Lymph nodes: Cervical, supraclavicular, and axillary nodes normal. Resp: clear to auscultation bilaterally Back: symmetric, no curvature. ROM normal. No CVA tenderness. Cardio: regular rate and rhythm, S1, S2 normal, no murmur, click, rub or gallop GI: soft, non-tender; bowel sounds normal; no masses,  no organomegaly Extremities: extremities normal, atraumatic, no cyanosis or edema Neurologic: Alert and oriented X 3, normal strength and tone. Normal symmetric reflexes. Normal coordination and gait  ECOG PERFORMANCE STATUS: 1 - Symptomatic but completely ambulatory  Blood pressure 124/78, pulse 98, temperature 98.1 F (36.7 C), temperature source Oral, resp. rate 18, height 6' (1.829 m), weight 180 lb 8 oz (81.874 kg), SpO2 98 %.  LABORATORY DATA: Lab Results  Component Value Date   WBC 8.5 03/09/2014   HGB 12.0* 03/09/2014   HCT 37.9* 03/09/2014   MCV 83.3 03/09/2014   PLT 462* 03/09/2014      Chemistry      Component Value Date/Time   NA 143 03/09/2014 1246   NA 139 11/16/2013 0755   NA 142 04/28/2013 0819   K 4.3 03/09/2014 1246   K 4.1 11/16/2013 0755   CL 100 11/16/2013  0755   CO2 26 03/09/2014 1246   CO2 23 11/16/2013 0755   BUN 20.0 03/09/2014 1246   BUN 12 11/16/2013 0755   BUN 12 04/28/2013 0819   CREATININE 0.8 03/09/2014 1246   CREATININE 0.73 11/16/2013 0755      Component Value Date/Time   CALCIUM 9.8 03/09/2014 1246   CALCIUM 10.0 11/16/2013 0755   ALKPHOS 88 03/09/2014 1246   ALKPHOS 107 04/28/2013 0819   AST 21 03/09/2014 1246   AST 19 04/28/2013 0819   ALT 23 03/09/2014 1246   ALT 12 04/28/2013 0819   BILITOT 0.24 03/09/2014 1246   BILITOT 0.4 04/28/2013 0819       RADIOGRAPHIC STUDIES:  ASSESSMENT AND PLAN: This is a very pleasant 74 years old [place] male  with:   1) metastatic non-small cell lung cancer initially diagnosed as stage IIIA non-small cell lung cancer who completed a course of concurrent chemoradiation with weekly carboplatin and paclitaxel with no significant complaints except for radiation induced odynophagia.  He also completed 3 cycles of consolidation chemotherapy with reduced dose carboplatin and paclitaxel. He is currently undergoing immunotherapy with Nivolumab status post  11 cycles. He is tolerating this treatment fairly well. Patient was discussed with and also seen by Dr. Julien Nordmann. He will proceed with cycle # 12 today as a scheduled.he will follow-up in 2 weeks with a restaging CT scan of the chest with contrast to reevaluate his disease.   2) right sided pleural effusion: Status post right thoracentesis. He did not notice any significant improvement in his shortness of breath after the procedure. The final cytology of the pleural fluid was negative for malignancy. We will continue to monitor this on upcoming scan.  3) Insomnia: Patient's insurance will no longer cover Ambien. He previously got good results with Restoril 30 mg at bedtime. He with his next prescription we will resume the Restoril.  He will return for a followup visit in 2 weeks before the next cycle of his treatment for reevaluation before the start of cycle #13 of his treatment.  He was advised to call immediately if he has any concerning symptoms in the interval.  The patient voices understanding of current disease status and treatment options and is in agreement with the current care plan.  All questions were answered. The patient knows to call the clinic with any problems, questions or concerns. We can certainly see the patient much sooner if necessary.  Carlton Adam, PA-C 03/09/2014  ADDENDUM: Hematology/Oncology Attending:  I had a face to face encounter with the patient. I recommended his care plan. This is a very pleasant 74 years old [place]  male with metastatic non-small cell lung cancer currently undergoing immunotherapy with Nivolumab status post 11 cycles and tolerating his treatment fairly well with no significant adverse effects. He denied having any significant skin rash, diarrhea, nausea or vomiting. I recommended for the patient to proceed with cycle #2 today as a scheduled. He will come back for follow-up visit in 2 weeks for evaluation after repeating CT scan of the chest for restaging of his disease. He was advised to call immediately if he has any concerning symptoms in the interval.  Disclaimer: This note was dictated with voice recognition software. Similar sounding words can inadvertently be transcribed and may not be corrected upon review. Eilleen Kempf., MD 03/13/2014

## 2014-03-10 ENCOUNTER — Encounter: Payer: Self-pay | Admitting: Internal Medicine

## 2014-03-10 ENCOUNTER — Telehealth: Payer: Self-pay

## 2014-03-10 ENCOUNTER — Ambulatory Visit
Admission: RE | Admit: 2014-03-10 | Discharge: 2014-03-10 | Disposition: A | Discharge status: Home / Self Care | Payer: Medicare HMO | Source: Ambulatory Visit | Attending: Internal Medicine | Admitting: Internal Medicine

## 2014-03-10 ENCOUNTER — Ambulatory Visit (INDEPENDENT_AMBULATORY_CARE_PROVIDER_SITE_OTHER): Payer: Medicare HMO | Admitting: Internal Medicine

## 2014-03-10 VITALS — BP 126/84 | HR 63 | Temp 97.2°F | Ht 72.0 in | Wt 179.4 lb

## 2014-03-10 DIAGNOSIS — J322 Chronic ethmoidal sinusitis: Secondary | ICD-10-CM | POA: Insufficient documentation

## 2014-03-10 DIAGNOSIS — J0121 Acute recurrent ethmoidal sinusitis: Secondary | ICD-10-CM | POA: Diagnosis not present

## 2014-03-10 HISTORY — DX: Chronic ethmoidal sinusitis: J32.2

## 2014-03-10 MED ORDER — AMOXICILLIN-POT CLAVULANATE 875-125 MG PO TABS: ORAL_TABLET | ORAL | Status: DC

## 2014-03-10 MED ORDER — TRIAMCINOLONE ACETONIDE 55 MCG/ACT NA AERO: 2.0000 | INHALATION_SPRAY | Freq: Every day | NASAL | Status: DC

## 2014-03-10 NOTE — Telephone Encounter (Signed)
He can either get it OTC or if he prefers, send a prescription for Flonase 2 puffs in each nostril daily.

## 2014-03-10 NOTE — Telephone Encounter (Signed)
Message left on triage voicemail: Patient thinks sinus infection is coming back, patient with more sinus pressure.  Last OV 02/22/14  Patient aware he needs an appointment, patient is scheduled to see Dr.Green today at 12:45 pm

## 2014-03-10 NOTE — Telephone Encounter (Signed)
Message left on triage voicemail: Nasocort is no longer available via rx. Alternatives is Nasonex or Flonase

## 2014-03-10 NOTE — Progress Notes (Signed)
Patient ID: Steve [name], male   DOB: 1940/07/20, 74 y.o.   MRN: [id number]    Facility  [facility]    Place of Service:   OFFICE   Allergies  Allergen Reactions  . Morphine And Related Hives    Chief Complaint  Patient presents with  . Acute Visit    Complains of Sinus Infection    HPI:  Acute recurrent ethmoidal sinusitis - patient was seen 02/22/2014 and diagnosed with acute frontal sinusitis by Dr. Mariea Clonts. He was put on doxycycline and advised to use neti pot and Sudafed. He says he was getting better from this but approximately 3 days ago became worse again. He describes a pressure and pain in the center of his face between the eyes and above the eyes. He has not had epistaxis and there has been very little drainage. There is good air exchange and no apparent obstruction in the nose. He denies change in vision. There is a popping noise in his ears now that was not there in the past. There is no fever.  Patient had an MRI of the brain 12/16/2013 that showed paranasal sinuses and mastoids normal.   Medications: Patient's Medications  New Prescriptions   No medications on file  Previous Medications   ASPIRIN 81 MG TABLET    Take 81 mg by mouth daily.   DULOXETINE (CYMBALTA) 60 MG CAPSULE    TAKE 1 CAPSULE BY MOUTH EVERY DAY   FLECAINIDE (TAMBOCOR) 150 MG TABLET    Take 0.5 tablets (75 mg total) by mouth 2 (two) times daily.   GABAPENTIN (NEURONTIN) 300 MG CAPSULE    TAKE 1 CAPSULE BY MOUTH THREE TIMES DAILY   METOPROLOL TARTRATE (LOPRESSOR) 25 MG TABLET    Take 1 tablet (25 mg total) by mouth 2 (two) times daily.   MIDODRINE (PROAMATINE) 2.5 MG TABLET    Take 1 tablet (2.5 mg total) by mouth daily.   OXYCODONE-ACETAMINOPHEN (ROXICET) 5-325 MG PER TABLET    Take 1 tablet by mouth every 6 (six) hours as needed for severe pain.   SIMVASTATIN (ZOCOR) 20 MG TABLET    Take one tablet by mouth once daily to lower cholesterol   ZOLPIDEM (AMBIEN) 10 MG TABLET    Take one tablet by mouth once  daily at bedtime for rest  Modified Medications   No medications on file  Discontinued Medications   No medications on file     Review of Systems  Constitutional: Negative for fever and chills.  HENT: Negative for congestion and sore throat.        Ear pressure and popping noises. Tenderness between the eyes and on the forehead.  Eyes:       Tenderness between the eyes and sometimes in the eyes.  Respiratory: Positive for cough, shortness of breath and wheezing.        History of lung cancer  Cardiovascular: Negative for chest pain and leg swelling.  Gastrointestinal: Negative.   Endocrine: Negative for cold intolerance, heat intolerance, polydipsia, polyphagia and polyuria.       History of elevated blood sugars  Genitourinary: Negative.   Musculoskeletal: Positive for myalgias.  Allergic/Immunologic: Negative.   Neurological: Positive for headaches.  Psychiatric/Behavioral: Negative.     Filed Vitals:   03/10/14 1308  BP: 126/84  Pulse: 63  Temp: 97.2 F (36.2 C)  TempSrc: Oral  Height: 6' (1.829 m)  Weight: 179 lb 6.4 oz (81.375 kg)   Body mass index is 24.33 kg/(m^2).  Physical  Exam  Constitutional: He is oriented to person, place, and time. He appears well-developed and well-nourished. No distress.  HENT:  Head: Normocephalic and atraumatic.  Right Ear: External ear normal.  Left Ear: External ear normal.  Nose: Nose normal.  Mouth/Throat: Oropharynx is clear and moist. No oropharyngeal exudate.  Tapping on the frontal sinuses and maxillary sinuses and in the glabellar area does not elicit much discomfort. Inspection of the nares bilaterally shows normal membranes which do not appear inflamed and there is no obstruction of the nasal cavity with polyps or otherwise. Oropharynx is unremarkable.  Eyes: Conjunctivae and EOM are normal. Pupils are equal, round, and reactive to light.  Neck: Neck supple. No JVD present. No tracheal deviation present. No thyromegaly  present.  Cardiovascular: Normal rate, regular rhythm and normal heart sounds.  Exam reveals no gallop and no friction rub.   No murmur heard. Pulmonary/Chest: Effort normal. No respiratory distress. He has no wheezes. He has no rales. He exhibits no tenderness.  Right base with diminished breath sounds and some crackles present  Abdominal: He exhibits no distension and no mass. There is no tenderness.  Musculoskeletal: Normal range of motion. He exhibits no edema or tenderness.  Lymphadenopathy:    He has no cervical adenopathy.  Neurological: He is alert and oriented to person, place, and time. He has normal reflexes. No cranial nerve deficit. Coordination normal.  Skin: Skin is warm and dry. No rash noted. No erythema. No pallor.  Psychiatric: He has a normal mood and affect.     Labs reviewed: Appointment on 03/09/2014  Component Date Value Ref Range Status  . WBC 03/09/2014 8.5  4.0 - 10.3 10e3/uL Final  . NEUT# 03/09/2014 6.2  1.5 - 6.5 10e3/uL Final  . HGB 03/09/2014 12.0* 13.0 - 17.1 g/dL Final  . HCT 03/09/2014 37.9* 38.4 - 49.9 % Final  . Platelets 03/09/2014 462* 140 - 400 10e3/uL Final  . MCV 03/09/2014 83.3  79.3 - 98.0 fL Final  . MCH 03/09/2014 26.3* 27.2 - 33.4 pg Final  . MCHC 03/09/2014 31.6* 32.0 - 36.0 g/dL Final  . RBC 03/09/2014 4.55  4.20 - 5.82 10e6/uL Final  . RDW 03/09/2014 18.2* 11.0 - 14.6 % Final  . lymph# 03/09/2014 1.2  0.9 - 3.3 10e3/uL Final  . MONO# 03/09/2014 0.8  0.1 - 0.9 10e3/uL Final  . Eosinophils Absolute 03/09/2014 0.2  0.0 - 0.5 10e3/uL Final  . Basophils Absolute 03/09/2014 0.1  0.0 - 0.1 10e3/uL Final  . NEUT% 03/09/2014 73.0  39.0 - 75.0 % Final  . LYMPH% 03/09/2014 14.3  14.0 - 49.0 % Final  . MONO% 03/09/2014 9.5  0.0 - 14.0 % Final  . EOS% 03/09/2014 2.4  0.0 - 7.0 % Final  . BASO% 03/09/2014 0.8  0.0 - 2.0 % Final  . TSH 03/09/2014 2.092  0.320 - 4.118 m(IU)/L Final  . Sodium 03/09/2014 143  136 - 145 mEq/L Final  . Potassium  03/09/2014 4.3  3.5 - 5.1 mEq/L Final  . Chloride 03/09/2014 105  98 - 109 mEq/L Final  . CO2 03/09/2014 26  22 - 29 mEq/L Final  . Glucose 03/09/2014 137  70 - 140 mg/dl Final  . BUN 03/09/2014 20.0  7.0 - 26.0 mg/dL Final  . Creatinine 03/09/2014 0.8  0.7 - 1.3 mg/dL Final  . Total Bilirubin 03/09/2014 0.24  0.20 - 1.20 mg/dL Final  . Alkaline Phosphatase 03/09/2014 88  40 - 150 U/L Final  . AST 03/09/2014  21  5 - 34 U/L Final  . ALT 03/09/2014 23  0 - 55 U/L Final  . Total Protein 03/09/2014 7.3  6.4 - 8.3 g/dL Final  . Albumin 03/09/2014 3.4* 3.5 - 5.0 g/dL Final  . Calcium 03/09/2014 9.8  8.4 - 10.4 mg/dL Final  . Anion Gap 03/09/2014 12* 3 - 11 mEq/L Final  . EGFR 03/09/2014 88* >90 ml/min/1.73 m2 Final   eGFR is calculated using the CKD-EPI Creatinine Equation (2009)  Appointment on 02/23/2014  Component Date Value Ref Range Status  . WBC 02/23/2014 10.9* 4.0 - 10.3 10e3/uL Final  . NEUT# 02/23/2014 8.6* 1.5 - 6.5 10e3/uL Final  . HGB 02/23/2014 12.1* 13.0 - 17.1 g/dL Final  . HCT 02/23/2014 39.1  38.4 - 49.9 % Final  . Platelets 02/23/2014 429* 140 - 400 10e3/uL Final  . MCV 02/23/2014 84.1  79.3 - 98.0 fL Final  . MCH 02/23/2014 26.0* 27.2 - 33.4 pg Final  . MCHC 02/23/2014 30.9* 32.0 - 36.0 g/dL Final  . RBC 02/23/2014 4.65  4.20 - 5.82 10e6/uL Final  . RDW 02/23/2014 17.1* 11.0 - 14.6 % Final  . lymph# 02/23/2014 1.1  0.9 - 3.3 10e3/uL Final  . MONO# 02/23/2014 1.0* 0.1 - 0.9 10e3/uL Final  . Eosinophils Absolute 02/23/2014 0.2  0.0 - 0.5 10e3/uL Final  . Basophils Absolute 02/23/2014 0.0  0.0 - 0.1 10e3/uL Final  . NEUT% 02/23/2014 78.9* 39.0 - 75.0 % Final  . LYMPH% 02/23/2014 9.8* 14.0 - 49.0 % Final  . MONO% 02/23/2014 9.2  0.0 - 14.0 % Final  . EOS% 02/23/2014 1.7  0.0 - 7.0 % Final  . BASO% 02/23/2014 0.4  0.0 - 2.0 % Final  . Sodium 02/23/2014 140  136 - 145 mEq/L Final  . Potassium 02/23/2014 4.5  3.5 - 5.1 mEq/L Final  . Chloride 02/23/2014 103  98 - 109  mEq/L Final  . CO2 02/23/2014 27  22 - 29 mEq/L Final  . Glucose 02/23/2014 132  70 - 140 mg/dl Final  . BUN 02/23/2014 17.9  7.0 - 26.0 mg/dL Final  . Creatinine 02/23/2014 0.8  0.7 - 1.3 mg/dL Final  . Total Bilirubin 02/23/2014 0.31  0.20 - 1.20 mg/dL Final  . Alkaline Phosphatase 02/23/2014 88  40 - 150 U/L Final  . AST 02/23/2014 23  5 - 34 U/L Final  . ALT 02/23/2014 29  0 - 55 U/L Final  . Total Protein 02/23/2014 7.4  6.4 - 8.3 g/dL Final  . Albumin 02/23/2014 3.3* 3.5 - 5.0 g/dL Final  . Calcium 02/23/2014 9.9  8.4 - 10.4 mg/dL Final  . Anion Gap 02/23/2014 10  3 - 11 mEq/L Final  . EGFR 02/23/2014 87* >90 ml/min/1.73 m2 Final   eGFR is calculated using the CKD-EPI Creatinine Equation (2009)  Hospital Outpatient Visit on 02/11/2014  Component Date Value Ref Range Status  . pH, Fluid Type 02/11/2014 CORRECTED ON 02/04 AT 1429: PREVIOUSLY REPORTED AS PLEURAL R   Corrected   CORRECTED ON 02/05 AT 0053: PREVIOUSLY REPORTED AS FLUID RIGHT PLEURAL, CORRECTED ON 02/04 AT 1429: PREVIOUSLY REPORTED AS Pleural R  . pH, Fluid 02/11/2014 7.50   Final   Performed at [facility]  . LD, Fluid 02/11/2014 182* 3 - 23 U/L Final   Comment: (NOTE) Results should be evaluated in conjunction with serum values   . Fluid Type-FLDH 02/11/2014 FLUID   Corrected   Comment: RIGHT PLEURAL CORRECTED ON 02/04 AT 1434: PREVIOUSLY REPORTED  AS Pleural R   . Total protein, fluid 02/11/2014 4.7   Final   Comment: (NOTE) No normal range established for this test Results should be evaluated in conjunction with serum values   . Fluid Type-FTP 02/11/2014 FLUID   Corrected   Comment: RIGHT PLEURAL CORRECTED ON 02/04 AT 1434: PREVIOUSLY REPORTED AS Pleural R   . Fluid Type-FCT 02/11/2014 FLUID   Corrected   Comment: RIGHT PLEURAL CORRECTED ON 02/04 AT 1429: PREVIOUSLY REPORTED AS Pleural R   . Color, Fluid 02/11/2014 YELLOW* YELLOW Final  . Appearance, Fluid 02/11/2014 CLOUDY* CLEAR Final  .  WBC, Fluid 02/11/2014 2780* 0 - 1000 cu mm Final  . Neutrophil Count, Fluid 02/11/2014 1  0 - 25 % Final  . Lymphs, Fluid 02/11/2014 81   Final  . Monocyte-Macrophage-Serous Fluid 02/11/2014 18* 50 - 90 % Final  . Eos, Fluid 02/11/2014 0   Final  . Other Cells, Fluid 02/11/2014 FEW MESOTHELIAL CELLS PRESENT   Final  . Specimen Description 02/11/2014 FLUID RIGHT PLEURAL   Final  . Special Requests 02/11/2014 NONE   Final  . Gram Stain 02/11/2014    Final                   Value:MODERATE WBC PRESENT, PREDOMINANTLY MONONUCLEAR NO ORGANISMS SEEN Performed at [facility]   . Culture 02/11/2014    Final                   Value:NO GROWTH 3 DAYS Performed at [facility]   . Report Status 02/11/2014 02/15/2014 FINAL   Final  . Glucose, Fluid 02/11/2014 104   Final   Comment: (NOTE) No normal range established for this test Results should be evaluated in conjunction with serum values   . Fluid Type-FGLU 02/11/2014 FLUID   Corrected   Comment: RIGHT PLEURAL CORRECTED ON 02/04 AT 1435: PREVIOUSLY REPORTED AS Pleural R   . Amylase, Pleural Fluid 02/11/2014 79   Final   Comment: (NOTE) Reference range: Values are considered abnormal if they are greater than or equal to two times a simultaneously analyzed serum value. Performed at [facility]   . Miscellaneous Test 02/11/2014 TRIGLYCERIDES   Final  . Miscellaneous Test Results 02/11/2014 Pleural Fluid Triglycerides:  18 mg/dL   Final   Comment: Reference range Chylous: >100 mg/dL Reference Range non Chylous: <50 mg/dL   Appointment on 02/09/2014  Component Date Value Ref Range Status  . WBC 02/09/2014 9.5  4.0 - 10.3 10e3/uL Final  . NEUT# 02/09/2014 7.3* 1.5 - 6.5 10e3/uL Final  . HGB 02/09/2014 11.9* 13.0 - 17.1 g/dL Final  . HCT 02/09/2014 38.4  38.4 - 49.9 % Final  . Platelets 02/09/2014 529* 140 - 400 10e3/uL Final  . MCV 02/09/2014 84.2  79.3 - 98.0 fL Final  . MCH 02/09/2014 26.1* 27.2 - 33.4 pg  Final  . MCHC 02/09/2014 31.0* 32.0 - 36.0 g/dL Final  . RBC 02/09/2014 4.56  4.20 - 5.82 10e6/uL Final  . RDW 02/09/2014 16.8* 11.0 - 14.6 % Final  . lymph# 02/09/2014 1.2  0.9 - 3.3 10e3/uL Final  . MONO# 02/09/2014 0.9  0.1 - 0.9 10e3/uL Final  . Eosinophils Absolute 02/09/2014 0.2  0.0 - 0.5 10e3/uL Final  . Basophils Absolute 02/09/2014 0.0  0.0 - 0.1 10e3/uL Final  . NEUT% 02/09/2014 76.6* 39.0 - 75.0 % Final  . LYMPH% 02/09/2014 12.5* 14.0 - 49.0 % Final  . MONO% 02/09/2014 8.9  0.0 -  14.0 % Final  . EOS% 02/09/2014 1.8  0.0 - 7.0 % Final  . BASO% 02/09/2014 0.2  0.0 - 2.0 % Final  . TSH 02/09/2014 2.136  0.320 - 4.118 m(IU)/L Final  . Sodium 02/09/2014 140  136 - 145 mEq/L Final  . Potassium 02/09/2014 4.7  3.5 - 5.1 mEq/L Final  . Chloride 02/09/2014 103  98 - 109 mEq/L Final  . CO2 02/09/2014 26  22 - 29 mEq/L Final  . Glucose 02/09/2014 135  70 - 140 mg/dl Final  . BUN 02/09/2014 22.0  7.0 - 26.0 mg/dL Final  . Creatinine 02/09/2014 1.1  0.7 - 1.3 mg/dL Final  . Total Bilirubin 02/09/2014 0.24  0.20 - 1.20 mg/dL Final  . Alkaline Phosphatase 02/09/2014 90  40 - 150 U/L Final  . AST 02/09/2014 20  5 - 34 U/L Final  . ALT 02/09/2014 23  0 - 55 U/L Final  . Total Protein 02/09/2014 7.6  6.4 - 8.3 g/dL Final  . Albumin 02/09/2014 3.3* 3.5 - 5.0 g/dL Final  . Calcium 02/09/2014 9.5  8.4 - 10.4 mg/dL Final  . Anion Gap 02/09/2014 11  3 - 11 mEq/L Final  . EGFR 02/09/2014 69* >90 ml/min/1.73 m2 Final   eGFR is calculated using the CKD-EPI Creatinine Equation (2009)  Appointment on 01/28/2014  Component Date Value Ref Range Status  . WBC 01/28/2014 11.2* 4.0 - 10.5 K/uL Final  . RBC 01/28/2014 4.68  4.22 - 5.81 Mil/uL Final  . Platelets 01/28/2014 624.0* 150.0 - 400.0 K/uL Final  . Hemoglobin 01/28/2014 12.4* 13.0 - 17.0 g/dL Final  . HCT 01/28/2014 37.8* 39.0 - 52.0 % Final  . MCV 01/28/2014 80.8  78.0 - 100.0 fl Final  . MCHC 01/28/2014 32.7  30.0 - 36.0 g/dL Final  . RDW  01/28/2014 17.9* 11.5 - 15.5 % Final  . LDH 01/28/2014 185  94 - 250 U/L Final  . Total Protein 01/28/2014 8.7* 6.0 - 8.3 g/dL Final  . Albumin 01/28/2014 4.2  3.5 - 5.2 g/dL Final  . Lipase 01/28/2014 59.0  11.0 - 59.0 U/L Final  . Sed Rate 01/28/2014 97* 0 - 22 mm/hr Final  . INR 01/28/2014 CANCELED   Final-Edited   Comment: Testing not performed due to the age of this specimen. Reference interval is for non-anticoagulated patients. Suggested INR therapeutic range for Vitamin K antagonist therapy:    Standard Dose (moderate intensity                   therapeutic range):       2.0 - 3.0    Higher intensity therapeutic range       2.5 - 3.5  Result canceled by the ancillary   . Prothrombin Time 01/28/2014 CANCELED   Final-Edited   Comment: Test not performed  Result canceled by the ancillary   . aPTT 01/28/2014 CANCELED   Final-Edited   Comment: Test not performed  Result canceled by the ancillary   Appointment on 01/26/2014  Component Date Value Ref Range Status  . WBC 01/26/2014 11.2* 4.0 - 10.3 10e3/uL Final  . NEUT# 01/26/2014 7.8* 1.5 - 6.5 10e3/uL Final  . HGB 01/26/2014 11.9* 13.0 - 17.1 g/dL Final  . HCT 01/26/2014 37.5* 38.4 - 49.9 % Final  . Platelets 01/26/2014 529* 140 - 400 10e3/uL Final  . MCV 01/26/2014 83.3  79.3 - 98.0 fL Final  . MCH 01/26/2014 26.4* 27.2 - 33.4 pg Final  . MCHC 01/26/2014 31.7* 32.0 -  36.0 g/dL Final  . RBC 01/26/2014 4.50  4.20 - 5.82 10e6/uL Final  . RDW 01/26/2014 17.0* 11.0 - 14.6 % Final  . lymph# 01/26/2014 1.7  0.9 - 3.3 10e3/uL Final  . MONO# 01/26/2014 1.5* 0.1 - 0.9 10e3/uL Final  . Eosinophils Absolute 01/26/2014 0.3  0.0 - 0.5 10e3/uL Final  . Basophils Absolute 01/26/2014 0.0  0.0 - 0.1 10e3/uL Final  . NEUT% 01/26/2014 69.1  39.0 - 75.0 % Final  . LYMPH% 01/26/2014 14.7  14.0 - 49.0 % Final  . MONO% 01/26/2014 13.4  0.0 - 14.0 % Final  . EOS% 01/26/2014 2.5  0.0 - 7.0 % Final  . BASO% 01/26/2014 0.3  0.0 - 2.0 % Final    . Sodium 01/26/2014 140  136 - 145 mEq/L Final  . Potassium 01/26/2014 4.2  3.5 - 5.1 mEq/L Final  . Chloride 01/26/2014 103  98 - 109 mEq/L Final  . CO2 01/26/2014 24  22 - 29 mEq/L Final  . Glucose 01/26/2014 91  70 - 140 mg/dl Final  . BUN 01/26/2014 21.1  7.0 - 26.0 mg/dL Final  . Creatinine 01/26/2014 0.8  0.7 - 1.3 mg/dL Final  . Total Bilirubin 01/26/2014 0.29  0.20 - 1.20 mg/dL Final  . Alkaline Phosphatase 01/26/2014 93  40 - 150 U/L Final  . AST 01/26/2014 31  5 - 34 U/L Final  . ALT 01/26/2014 30  0 - 55 U/L Final  . Total Protein 01/26/2014 8.0  6.4 - 8.3 g/dL Final  . Albumin 01/26/2014 3.4* 3.5 - 5.0 g/dL Final  . Calcium 01/26/2014 9.4  8.4 - 10.4 mg/dL Final  . Anion Gap 01/26/2014 13* 3 - 11 mEq/L Final  . EGFR 01/26/2014 87* >90 ml/min/1.73 m2 Final   eGFR is calculated using the CKD-EPI Creatinine Equation (2009)  Appointment on 01/12/2014  Component Date Value Ref Range Status  . WBC 01/12/2014 9.5  4.0 - 10.3 10e3/uL Final  . NEUT# 01/12/2014 7.2* 1.5 - 6.5 10e3/uL Final  . HGB 01/12/2014 11.7* 13.0 - 17.1 g/dL Final  . HCT 01/12/2014 37.3* 38.4 - 49.9 % Final  . Platelets 01/12/2014 601* 140 - 400 10e3/uL Final  . MCV 01/12/2014 82.6  79.3 - 98.0 fL Final  . MCH 01/12/2014 25.8* 27.2 - 33.4 pg Final  . MCHC 01/12/2014 31.3* 32.0 - 36.0 g/dL Final  . RBC 01/12/2014 4.52  4.20 - 5.82 10e6/uL Final  . RDW 01/12/2014 17.2* 11.0 - 14.6 % Final  . lymph# 01/12/2014 1.3  0.9 - 3.3 10e3/uL Final  . MONO# 01/12/2014 0.8  0.1 - 0.9 10e3/uL Final  . Eosinophils Absolute 01/12/2014 0.2  0.0 - 0.5 10e3/uL Final  . Basophils Absolute 01/12/2014 0.1  0.0 - 0.1 10e3/uL Final  . NEUT% 01/12/2014 75.7* 39.0 - 75.0 % Final  . LYMPH% 01/12/2014 13.2* 14.0 - 49.0 % Final  . MONO% 01/12/2014 8.6  0.0 - 14.0 % Final  . EOS% 01/12/2014 1.7  0.0 - 7.0 % Final  . BASO% 01/12/2014 0.8  0.0 - 2.0 % Final  . TSH 01/12/2014 2.062  0.320 - 4.118 m(IU)/L Final  . Sodium 01/12/2014 138   136 - 145 mEq/L Final  . Potassium 01/12/2014 4.2  3.5 - 5.1 mEq/L Final  . Chloride 01/12/2014 103  98 - 109 mEq/L Final  . CO2 01/12/2014 27  22 - 29 mEq/L Final  . Glucose 01/12/2014 122  70 - 140 mg/dl Final  . BUN 01/12/2014 20.1  7.0 -  26.0 mg/dL Final  . Creatinine 01/12/2014 0.8  0.7 - 1.3 mg/dL Final  . Total Bilirubin 01/12/2014 0.33  0.20 - 1.20 mg/dL Final  . Alkaline Phosphatase 01/12/2014 87  40 - 150 U/L Final  . AST 01/12/2014 24  5 - 34 U/L Final  . ALT 01/12/2014 24  0 - 55 U/L Final  . Total Protein 01/12/2014 7.6  6.4 - 8.3 g/dL Final  . Albumin 01/12/2014 3.1* 3.5 - 5.0 g/dL Final  . Calcium 01/12/2014 9.5  8.4 - 10.4 mg/dL Final  . Anion Gap 01/12/2014 9  3 - 11 mEq/L Final  . EGFR 01/12/2014 87* >90 ml/min/1.73 m2 Final   eGFR is calculated using the CKD-EPI Creatinine Equation (2009)  Office Visit on 12/29/2013  Component Date Value Ref Range Status  . WBC 12/29/2013 10.3  4.0 - 10.3 10e3/uL Final  . NEUT# 12/29/2013 8.2* 1.5 - 6.5 10e3/uL Final  . HGB 12/29/2013 11.5* 13.0 - 17.1 g/dL Final  . HCT 12/29/2013 36.5* 38.4 - 49.9 % Final  . Platelets 12/29/2013 536* 140 - 400 10e3/uL Final  . MCV 12/29/2013 82.6  79.3 - 98.0 fL Final  . MCH 12/29/2013 25.9* 27.2 - 33.4 pg Final  . MCHC 12/29/2013 31.4* 32.0 - 36.0 g/dL Final  . RBC 12/29/2013 4.42  4.20 - 5.82 10e6/uL Final  . RDW 12/29/2013 17.4* 11.0 - 14.6 % Final  . lymph# 12/29/2013 0.9  0.9 - 3.3 10e3/uL Final  . MONO# 12/29/2013 1.0* 0.1 - 0.9 10e3/uL Final  . Eosinophils Absolute 12/29/2013 0.1  0.0 - 0.5 10e3/uL Final  . Basophils Absolute 12/29/2013 0.1  0.0 - 0.1 10e3/uL Final  . NEUT% 12/29/2013 79.5* 39.0 - 75.0 % Final  . LYMPH% 12/29/2013 9.2* 14.0 - 49.0 % Final  . MONO% 12/29/2013 9.6  0.0 - 14.0 % Final  . EOS% 12/29/2013 1.2  0.0 - 7.0 % Final  . BASO% 12/29/2013 0.5  0.0 - 2.0 % Final  . Sodium 12/29/2013 139  136 - 145 mEq/L Final  . Potassium 12/29/2013 4.1  3.5 - 5.1 mEq/L Final  .  Chloride 12/29/2013 99  98 - 109 mEq/L Final  . CO2 12/29/2013 28  22 - 29 mEq/L Final  . Glucose 12/29/2013 148* 70 - 140 mg/dl Final  . BUN 12/29/2013 14.0  7.0 - 26.0 mg/dL Final  . Creatinine 12/29/2013 0.8  0.7 - 1.3 mg/dL Final  . Total Bilirubin 12/29/2013 0.37  0.20 - 1.20 mg/dL Final  . Alkaline Phosphatase 12/29/2013 94  40 - 150 U/L Final  . AST 12/29/2013 23  5 - 34 U/L Final  . ALT 12/29/2013 27  0 - 55 U/L Final  . Total Protein 12/29/2013 7.5  6.4 - 8.3 g/dL Final  . Albumin 12/29/2013 3.3* 3.5 - 5.0 g/dL Final  . Calcium 12/29/2013 9.7  8.4 - 10.4 mg/dL Final  . Anion Gap 12/29/2013 12* 3 - 11 mEq/L Final  . EGFR 12/29/2013 87* >90 ml/min/1.73 m2 Final   eGFR is calculated using the CKD-EPI Creatinine Equation (2009)  Admission on 12/17/2013, Discharged on 12/17/2013  Component Date Value Ref Range Status  . Troponin I 12/17/2013 <0.30  <0.30 ng/mL Final   Comment:        Due to the release kinetics of cTnI, a negative result within the first hours of the onset of symptoms does not rule out myocardial infarction with certainty. If myocardial infarction is still suspected, repeat the test at appropriate intervals.   Marland Kitchen  Glucose-Capillary 12/17/2013 108* 70 - 99 mg/dL Final  . Comment 1 12/17/2013 Notify RN   Final  . Comment 2 12/17/2013 Documented in Chart   Final  Appointment on 12/15/2013  Component Date Value Ref Range Status  . WBC 12/15/2013 9.0  4.0 - 10.3 10e3/uL Final  . NEUT# 12/15/2013 6.7* 1.5 - 6.5 10e3/uL Final  . HGB 12/15/2013 11.1* 13.0 - 17.1 g/dL Final  . HCT 12/15/2013 36.1* 38.4 - 49.9 % Final  . Platelets 12/15/2013 481* 140 - 400 10e3/uL Final  . MCV 12/15/2013 84.3  79.3 - 98.0 fL Final  . MCH 12/15/2013 25.9* 27.2 - 33.4 pg Final  . MCHC 12/15/2013 30.7* 32.0 - 36.0 g/dL Final  . RBC 12/15/2013 4.28  4.20 - 5.82 10e6/uL Final  . RDW 12/15/2013 16.3* 11.0 - 14.6 % Final  . lymph# 12/15/2013 1.3  0.9 - 3.3 10e3/uL Final  . MONO# 12/15/2013  0.8  0.1 - 0.9 10e3/uL Final  . Eosinophils Absolute 12/15/2013 0.1  0.0 - 0.5 10e3/uL Final  . Basophils Absolute 12/15/2013 0.0  0.0 - 0.1 10e3/uL Final  . NEUT% 12/15/2013 74.6  39.0 - 75.0 % Final  . LYMPH% 12/15/2013 14.1  14.0 - 49.0 % Final  . MONO% 12/15/2013 9.4  0.0 - 14.0 % Final  . EOS% 12/15/2013 1.6  0.0 - 7.0 % Final  . BASO% 12/15/2013 0.3  0.0 - 2.0 % Final  . TSH 12/15/2013 1.461  0.320 - 4.118 m(IU)/L Final  . Sodium 12/15/2013 141  136 - 145 mEq/L Final  . Potassium 12/15/2013 4.2  3.5 - 5.1 mEq/L Final  . Chloride 12/15/2013 103  98 - 109 mEq/L Final  . CO2 12/15/2013 26  22 - 29 mEq/L Final  . Glucose 12/15/2013 110  70 - 140 mg/dl Final  . BUN 12/15/2013 17.3  7.0 - 26.0 mg/dL Final  . Creatinine 12/15/2013 0.8  0.7 - 1.3 mg/dL Final  . Total Bilirubin 12/15/2013 0.26  0.20 - 1.20 mg/dL Final  . Alkaline Phosphatase 12/15/2013 85  40 - 150 U/L Final  . AST 12/15/2013 20  5 - 34 U/L Final  . ALT 12/15/2013 21  0 - 55 U/L Final  . Total Protein 12/15/2013 7.3  6.4 - 8.3 g/dL Final  . Albumin 12/15/2013 3.2* 3.5 - 5.0 g/dL Final  . Calcium 12/15/2013 9.5  8.4 - 10.4 mg/dL Final  . Anion Gap 12/15/2013 12* 3 - 11 mEq/L Final  . EGFR 12/15/2013 88* >90 ml/min/1.73 m2 Final   eGFR is calculated using the CKD-EPI Creatinine Equation (2009)  There may be more visits with results that are not included.   Assessment/Plan  1. Acute recurrent ethmoidal sinusitis - amoxicillin-clavulanate (AUGMENTIN) 875-125 MG per tablet; One twice daily for infection  Dispense: 20 tablet; Refill: 0 - DG Sinuses Complete; Future - triamcinolone (NASACORT AQ) 55 MCG/ACT AERO nasal inhaler; Place 2 sprays into the nose daily.  Dispense: 1 Inhaler; Refill: 12

## 2014-03-11 ENCOUNTER — Other Ambulatory Visit: Payer: Self-pay | Admitting: Internal Medicine

## 2014-03-11 MED ORDER — FLUTICASONE PROPIONATE 50 MCG/ACT NA SUSP: 2.0000 | Freq: Every day | NASAL | Status: DC

## 2014-03-11 NOTE — Telephone Encounter (Signed)
Sent rx for Flonase, left message on voicemail with instruction change

## 2014-03-12 NOTE — Patient Instructions (Signed)
Follow-up in 2 weeks with a restaging CT scan of your chest to reevaluate your disease.

## 2014-03-16 ENCOUNTER — Other Ambulatory Visit: Payer: Self-pay | Admitting: Medical Oncology

## 2014-03-16 ENCOUNTER — Telehealth: Payer: Self-pay | Admitting: Medical Oncology

## 2014-03-16 ENCOUNTER — Other Ambulatory Visit: Payer: Self-pay | Admitting: Internal Medicine

## 2014-03-16 DIAGNOSIS — Z85118 Personal history of other malignant neoplasm of bronchus and lung: Secondary | ICD-10-CM

## 2014-03-16 MED ORDER — DULOXETINE HCL 60 MG PO CPEP: 60.0000 mg | ORAL_CAPSULE | Freq: Every day | ORAL | Status: DC

## 2014-03-16 NOTE — Telephone Encounter (Signed)
Wife called and stated pt needs cymbalta refill. Pt notified that refill sent for #[phone] with 2 refills

## 2014-03-19 ENCOUNTER — Ambulatory Visit (HOSPITAL_COMMUNITY)
Admission: RE | Admit: 2014-03-19 | Discharge: 2014-03-19 | Disposition: A | Discharge status: Home / Self Care | Payer: Medicare HMO | Source: Ambulatory Visit | Attending: Physician Assistant | Admitting: Physician Assistant

## 2014-03-19 ENCOUNTER — Encounter (HOSPITAL_COMMUNITY): Payer: Self-pay

## 2014-03-19 DIAGNOSIS — Z79899 Other long term (current) drug therapy: Secondary | ICD-10-CM | POA: Diagnosis not present

## 2014-03-19 DIAGNOSIS — C3431 Malignant neoplasm of lower lobe, right bronchus or lung: Secondary | ICD-10-CM

## 2014-03-19 DIAGNOSIS — Z87891 Personal history of nicotine dependence: Secondary | ICD-10-CM | POA: Insufficient documentation

## 2014-03-19 MED ORDER — IOHEXOL 300 MG/ML  SOLN
80.0000 mL | Freq: Once | INTRAMUSCULAR | Status: AC | PRN
  Administered 2014-03-19: 80 mL via INTRAVENOUS

## 2014-03-23 ENCOUNTER — Other Ambulatory Visit: Payer: Self-pay | Admitting: Medical Oncology

## 2014-03-23 ENCOUNTER — Telehealth: Payer: Self-pay | Admitting: *Deleted

## 2014-03-23 ENCOUNTER — Ambulatory Visit (HOSPITAL_BASED_OUTPATIENT_CLINIC_OR_DEPARTMENT_OTHER): Payer: Medicare HMO

## 2014-03-23 ENCOUNTER — Ambulatory Visit: Payer: Medicare HMO

## 2014-03-23 ENCOUNTER — Telehealth: Payer: Self-pay | Admitting: Internal Medicine

## 2014-03-23 ENCOUNTER — Ambulatory Visit (HOSPITAL_BASED_OUTPATIENT_CLINIC_OR_DEPARTMENT_OTHER): Payer: Medicare HMO | Admitting: Internal Medicine

## 2014-03-23 ENCOUNTER — Other Ambulatory Visit (HOSPITAL_BASED_OUTPATIENT_CLINIC_OR_DEPARTMENT_OTHER): Payer: Medicare HMO

## 2014-03-23 ENCOUNTER — Encounter: Payer: Self-pay | Admitting: Internal Medicine

## 2014-03-23 VITALS — BP 124/77 | HR 91 | Temp 97.9°F | Resp 18 | Ht 72.0 in | Wt 177.3 lb

## 2014-03-23 DIAGNOSIS — Z95828 Presence of other vascular implants and grafts: Secondary | ICD-10-CM

## 2014-03-23 DIAGNOSIS — C3431 Malignant neoplasm of lower lobe, right bronchus or lung: Secondary | ICD-10-CM

## 2014-03-23 DIAGNOSIS — Z452 Encounter for adjustment and management of vascular access device: Secondary | ICD-10-CM

## 2014-03-23 DIAGNOSIS — G4700 Insomnia, unspecified: Secondary | ICD-10-CM

## 2014-03-23 LAB — CBC WITH DIFFERENTIAL/PLATELET
BASO%: 0.2 % (ref 0.0–2.0)
Basophils Absolute: 0 10*3/uL (ref 0.0–0.1)
EOS%: 1.9 % (ref 0.0–7.0)
Eosinophils Absolute: 0.2 10*3/uL (ref 0.0–0.5)
HCT: 39 % (ref 38.4–49.9)
HGB: 12.5 g/dL — ABNORMAL LOW (ref 13.0–17.1)
LYMPH%: 9.8 % — ABNORMAL LOW (ref 14.0–49.0)
MCH: 27.2 pg (ref 27.2–33.4)
MCHC: 32.1 g/dL (ref 32.0–36.0)
MCV: 84.8 fL (ref 79.3–98.0)
MONO#: 0.7 10*3/uL (ref 0.1–0.9)
MONO%: 7.4 % (ref 0.0–14.0)
NEUT%: 80.7 % — AB (ref 39.0–75.0)
NEUTROS ABS: 7.2 10*3/uL — AB (ref 1.5–6.5)
PLATELETS: 396 10*3/uL (ref 140–400)
RBC: 4.6 10*6/uL (ref 4.20–5.82)
RDW: 16.9 % — ABNORMAL HIGH (ref 11.0–14.6)
WBC: 8.9 10*3/uL (ref 4.0–10.3)
lymph#: 0.9 10*3/uL (ref 0.9–3.3)

## 2014-03-23 LAB — COMPREHENSIVE METABOLIC PANEL (CC13)
ALT: 40 U/L (ref 0–55)
ANION GAP: 13 meq/L — AB (ref 3–11)
AST: 24 U/L (ref 5–34)
Albumin: 3.3 g/dL — ABNORMAL LOW (ref 3.5–5.0)
Alkaline Phosphatase: 88 U/L (ref 40–150)
BUN: 18.5 mg/dL (ref 7.0–26.0)
CALCIUM: 9.7 mg/dL (ref 8.4–10.4)
CO2: 25 mEq/L (ref 22–29)
CREATININE: 0.8 mg/dL (ref 0.7–1.3)
Chloride: 106 mEq/L (ref 98–109)
Glucose: 138 mg/dl (ref 70–140)
Potassium: 4.2 mEq/L (ref 3.5–5.1)
Sodium: 144 mEq/L (ref 136–145)
Total Bilirubin: 0.27 mg/dL (ref 0.20–1.20)
Total Protein: 7.4 g/dL (ref 6.4–8.3)

## 2014-03-23 MED ORDER — SODIUM CHLORIDE 0.9 % IJ SOLN
10.0000 mL | INTRAMUSCULAR | Status: DC | PRN
  Administered 2014-03-23: 10 mL via INTRAVENOUS
  Filled 2014-03-23: qty 10

## 2014-03-23 MED ORDER — TEMAZEPAM 30 MG PO CAPS: 30.0000 mg | ORAL_CAPSULE | Freq: Every evening | ORAL | Status: DC | PRN

## 2014-03-23 MED ORDER — HEPARIN SOD (PORK) LOCK FLUSH 100 UNIT/ML IV SOLN
500.0000 [IU] | Freq: Once | INTRAVENOUS | Status: AC
  Administered 2014-03-23: 500 [IU] via INTRAVENOUS
  Filled 2014-03-23: qty 5

## 2014-03-23 NOTE — Progress Notes (Signed)
Pt requests sleeping pill ,specifically restoril. He had it back in june and it helped him sleep

## 2014-03-23 NOTE — Telephone Encounter (Signed)
erron

## 2014-03-23 NOTE — Telephone Encounter (Signed)
Steve [name] called to say he received a call from [name] saying that his [name] has been approved through June, 2016.  He understood that his treatment would change to [name] starting next week.  Reassured him that [name] is not aware of the change yet, but we will take care of pre-authorized for the [name].  He was reassured.

## 2014-03-23 NOTE — Telephone Encounter (Signed)
Steve [name] called to let [name] know what sleep medication he needs refilled.  It is temazepam 30 mg QHS prn.  He uses [facility] on [address].

## 2014-03-23 NOTE — Telephone Encounter (Signed)
Per staff message and POF I have scheduled appts. Advised scheduler of appts and to move labs. JMW  

## 2014-03-23 NOTE — Progress Notes (Signed)
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   Primary site: Lung (Right)  Staging method: AJCC 7th Edition  Clinical: Stage IIIA (T2b, N2, M0)  Summary: Stage IIIA (T2b, N2, M0)   PRIOR THERAPY:  1) Concurrent chemoradiation with weekly carboplatin for an AUC of 2 and paclitaxel 45 mg/m2, status post 4 cycles. 2) Consolidation chemotherapy with carboplatin for AUC of 5 and paclitaxel 175 mg/M2 every 3 weeks with Neulasta support, first cycle on 03/23/2013. Status post 3 cycles. 3) Immunotherapy with Nivolumab 3 mg/KG every 2 weeks. First dose 09/21/2013. Status post 12 cycles.   CURRENT THERAPY: Systemic chemotherapy with gemcitabine 1000 MG/M2 on days 1 and 8 every 3 weeks. First dose 03/30/2014.  DISEASE STAGE:  Lung cancer  Primary site: Lung (Right)  Staging method: AJCC 7th Edition  Clinical: Stage IIIA (T2b, N2, M0)  Summary: Stage IIIA (T2b, N2, M0)  CHEMOTHERAPY INTENT: Control/Palliative  CURRENT # OF CHEMOTHERAPY CYCLES: 1 CURRENT ANTIEMETICS: Zofran, dexamethasone, Compazine  CURRENT SMOKING STATUS: Former smoker, quit 01/09/2007  ORAL CHEMOTHERAPY AND CONSENT: n/a  CURRENT BISPHOSPHONATES USE: none  PAIN MANAGEMENT: Ibuprofen  NARCOTICS INDUCED CONSTIPATION: None  LIVING WILL AND CODE STATUS: ?  INTERVAL HISTORY: Steve [name] 74 y.o. male returns to the clinic today for followup visit accompanied by his wife. He has been on treatment with immunotherapy with Nivolumab for the last 12 cycles and tolerating his treatment fairly well. He denied having any significant chest pain but continues to have shortness of breath with exertion  with mild cough without hemoptysis. He denied having any weight loss or night sweats. The patient has no fever or chills and no nausea or vomiting. He had repeat CT scan of the chest performed recently and he is here for evaluation and discussion of his scan results.  MEDICAL HISTORY: Past Medical History  Diagnosis Date  . Dizziness and giddiness     positional vertigo  . Hyperlipidemia   . Impotence of organic origin   . Osteoarthritis, shoulder   . Allergy   . Hypothyroidism     pt denies thryoid disease, dr pandey note 11-19-12 says subclinical hypothryoid in epic  . COPD (chronic obstructive pulmonary disease)   . Insomnia   . Hx of radiation therapy 02/2013    right lung  . Diabetes mellitus without complication     pt denies diabetes, dr pandey note says dm 11-19-12 epic  . 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 Malignant neoplasm of bladder, part unspecified   . Malignant neoplasm of bronchus and lung, unspecified site     ALLERGIES:  is allergic to morphine and related.  MEDICATIONS:  Current Outpatient Prescriptions  Medication Sig Dispense Refill  . amoxicillin-clavulanate (AUGMENTIN) 875-125 MG per tablet One twice daily for infection 20 tablet 0  . aspirin 81 MG tablet Take 81 mg by mouth daily.    . DULoxetine (CYMBALTA) 60 MG capsule Take 1 capsule (60 mg total) by mouth daily. 30 capsule 2  . flecainide (TAMBOCOR) 150 MG tablet Take 0.5 tablets (75 mg total) by mouth 2 (two) times daily. 90 tablet 1  . fluticasone (FLONASE) 50 MCG/ACT nasal spray  INSTILL 2 SPRAYS INTO BOTH NOSTRILS EVERY DAY 16 g 11  . gabapentin (NEURONTIN) 300 MG capsule TAKE 1 CAPSULE BY MOUTH THREE TIMES DAILY (Patient taking differently: TAKE 1 CAPSULE BY MOUTH TWICE DAILY) 270 capsule 0  . metoprolol tartrate (LOPRESSOR) 25 MG tablet Take 1 tablet (25 mg total) by mouth 2 (two) times daily. 90 tablet 1  . midodrine (PROAMATINE) 2.5 MG tablet Take 1 tablet (2.5 mg total) by  mouth daily. 90 tablet 5  . oxyCODONE-acetaminophen (ROXICET) 5-325 MG per tablet Take 1 tablet by mouth every 6 (six) hours as needed for severe pain. 120 tablet 0  . simvastatin (ZOCOR) 20 MG tablet Take one tablet by mouth once daily to lower cholesterol 90 tablet 3  . zolpidem (AMBIEN) 10 MG tablet Take one tablet by mouth once daily at bedtime for rest 90 tablet 0   No current facility-administered medications for this visit.    SURGICAL HISTORY:  Past Surgical History  Procedure Laterality Date  . Cystoscopy w/ dilation of bladder    . Tonsillectomy  age 29  . Endobronchial ultrasound Bilateral 12/15/2012    Procedure: ENDOBRONCHIAL ULTRASOUND;  Surgeon: Brand Males, MD;  Location: [facility];  Service: Cardiopulmonary;  Laterality: Bilateral;  . Colonoscopy  2009    [facility]    REVIEW OF SYSTEMS:  Constitutional: negative Eyes: negative Ears, nose, mouth, throat, and face: negative Respiratory: positive for cough, dyspnea on exertion and wheezing Cardiovascular: negative Gastrointestinal: negative Genitourinary:negative Integument/breast: negative Hematologic/lymphatic: negative Musculoskeletal:negative Neurological: positive for paresthesia Behavioral/Psych: negative Endocrine: negative Allergic/Immunologic: negative   PHYSICAL EXAMINATION: General appearance: alert, cooperative and no distress Head: Normocephalic, without obvious abnormality, atraumatic Neck: no adenopathy, no JVD, supple, symmetrical, trachea midline and thyroid not enlarged, symmetric, no tenderness/mass/nodules Lymph nodes: Cervical, supraclavicular, and axillary nodes normal. Resp: clear to auscultation bilaterally Back: symmetric, no curvature. ROM normal. No CVA tenderness. Cardio: regular rate and rhythm, S1, S2 normal, no murmur, click, rub or gallop GI: soft, non-tender; bowel sounds normal; no masses,  no organomegaly Extremities: extremities normal, atraumatic, no cyanosis or  edema Neurologic: Alert and oriented X 3, normal strength and tone. Normal symmetric reflexes. Normal coordination and gait  ECOG PERFORMANCE STATUS: 1 - Symptomatic but completely ambulatory  Blood pressure 124/77, pulse 91, temperature 97.9 F (36.6 C), temperature source Oral, resp. rate 18, height 6' (1.829 m), weight 177 lb 4.8 oz (80.423 kg).  LABORATORY DATA: Lab Results  Component Value Date   WBC 8.9 03/23/2014   HGB 12.5* 03/23/2014   HCT 39.0 03/23/2014   MCV 84.8 03/23/2014   PLT 396 03/23/2014      Chemistry      Component Value Date/Time   NA 143 03/09/2014 1246   NA 139 11/16/2013 0755   NA 142 04/28/2013 0819   K 4.3 03/09/2014 1246   K 4.1 11/16/2013 0755   CL 100 11/16/2013 0755   CO2 26 03/09/2014 1246   CO2 23 11/16/2013 0755   BUN 20.0 03/09/2014 1246   BUN 12 11/16/2013 0755   BUN 12 04/28/2013 0819   CREATININE 0.8 03/09/2014 1246   CREATININE 0.73 11/16/2013 0755      Component Value Date/Time   CALCIUM 9.8 03/09/2014 1246   CALCIUM 10.0 11/16/2013 0755   ALKPHOS 88 03/09/2014 1246   ALKPHOS 107 04/28/2013 0819   AST 21 03/09/2014 1246   AST 19 04/28/2013 0819   ALT 23 03/09/2014 1246   ALT 12 04/28/2013 0819   BILITOT 0.24 03/09/2014  1246   BILITOT 0.4 04/28/2013 0819       RADIOGRAPHIC STUDIES: Dg Sinuses Complete  03/10/2014   CLINICAL DATA:  Facial pain and fullness  EXAM: PARANASAL SINUSES - COMPLETE 3 + VIEW  COMPARISON:  None.  FINDINGS: The paranasal sinus are aerated. The right frontal sinus is hypoplastic. There is no evidence of sinus opacification air-fluid levels or mucosal thickening. No significant bone abnormalities are seen.  IMPRESSION: Negative sinus series.   Electronically Signed   By: Kathreen Devoid   On: 03/10/2014 16:45   Ct Chest W Contrast  03/19/2014   CLINICAL DATA:  Right lung cancer diagnosed in 2014. Oral chemotherapy ongoing. Sinus problems. Subsequent encounter.  EXAM: CT CHEST WITH CONTRAST  TECHNIQUE:  Multidetector CT imaging of the chest was performed during intravenous contrast administration.  CONTRAST:  43mL OMNIPAQUE IOHEXOL 300 MG/ML  SOLN  COMPARISON:  Radiographs 02/11/2014.  CT 01/22/2014.  FINDINGS: Mediastinum/Nodes: There is progressive enlargement of the subcarinal nodal mass, measuring 3.8 x 2.7 cm on image 32. This previously measured 2.7 x 2.0 cm. No other enlarged mediastinal, hilar or axillary lymph nodes identified. The thyroid gland, trachea and esophagus demonstrate no significant findings. The heart size is normal. There is no pericardial effusion.There is stable atherosclerosis of the aorta, great vessels and coronary arteries. Right IJ Port-A-Cath tip at the SVC right atrial junction.  Lungs/Pleura: Moderate-sized dependent right pleural effusion appears stable without pleural nodularity. There is persistent occlusion of the bronchus intermedius and irregular central narrowing of the right upper lobe bronchus. There is collapse of the right lower lobe with concern of a persistent right infrahilar mass, obscured by adjacent atelectasis. Paramediastinal radiation changes are present within the right lung. The patchy ground-glass opacities noted previously have generally improved. There are some fluctuating components in the left upper and lower lobes. No suspicious peripheral pulmonary nodules identified.  Upper abdomen: 8 mm hypervascular liver lesion in the right lobe on image 57 is similar to prior studies, likely an incidental vascular lesion. No suspicious hepatic or adrenal findings.  Musculoskeletal/Chest wall: There is no chest wall mass or suspicious osseous finding. Stable paraspinal osteophytes throughout the thoracic spine.  IMPRESSION: 1. Further enlargement in subcarinal nodal mass consistent with progressive metastatic disease. 2. Persistent concern of right infrahilar mass causing lower lobe obstruction. This mass remains largely obscured by the atelectatic lung and is not  well measured. 3. Overall improvement in patchy ground-glass opacities noted in both lungs compared with prior study, consistent with resolving inflammatory process. 4. Stable moderate size right pleural effusion.   Electronically Signed   By: Richardean Sale M.D.   On: 03/19/2014 16:28   ASSESSMENT AND PLAN: This is a very pleasant 74 years old [place] male with:   1) metastatic non-small cell lung cancer initially diagnosed as stage IIIA non-small cell lung cancer who completed a course of concurrent chemoradiation with weekly carboplatin and paclitaxel with no significant complaints except for radiation induced odynophagia.  He also completed 3 cycles of consolidation chemotherapy with reduced dose carboplatin and paclitaxel. He completed 12 cycles of immunotherapy with Nivolumab and tolerated his treatment fairly well. Unfortunately the recent CT scan of the chest showed further increase in the size of the subcarinal nodal mass consistent with disease progression. I discussed the scan results with the patient and his wife today and showed them the images. Allergies the patient the option of continuous treatment of Nivolumab and close monitoring versus discontinued immunotherapy and consideration of treatment with  systemic chemotherapy with single agent gemcitabine 1000 MG/M2 on days 1 and 8 every 3 weeks. I discussed with the patient adverse effect of the chemotherapy including but not limited to mild alopecia, nausea and vomiting, myelosuppression, liver or renal dysfunction. He is interested in switching to systemic chemotherapy and did not feel comfortable continuing immunotherapy for now in the presence of disease progression. I will arrange for the patient to receive the first dose of his treatment next week.  He would come back for followup visit in 4 weeks for reevaluation before the start of cycle #2 of his treatment.  He was advised to call immediately if he has any concerning symptoms in the  interval.  The patient voices understanding of current disease status and treatment options and is in agreement with the current care plan.  All questions were answered. The patient knows to call the clinic with any problems, questions or concerns. We can certainly see the patient much sooner if necessary.  Disclaimer: This note was dictated with voice recognition software. Similar sounding words can inadvertently be transcribed and may not be corrected upon review.

## 2014-03-23 NOTE — Patient Instructions (Signed)

## 2014-03-23 NOTE — Telephone Encounter (Signed)
Gave avs & calendar for March/April. Sent message to schedule treatment.

## 2014-03-24 ENCOUNTER — Ambulatory Visit: Payer: Medicare HMO | Admitting: Internal Medicine

## 2014-03-24 ENCOUNTER — Telehealth: Payer: Self-pay | Admitting: Medical Oncology

## 2014-03-24 NOTE — Telephone Encounter (Signed)
Called in restoril -

## 2014-03-26 ENCOUNTER — Ambulatory Visit: Payer: Medicare HMO | Admitting: Family

## 2014-03-26 ENCOUNTER — Telehealth: Payer: Self-pay | Admitting: Internal Medicine

## 2014-03-26 NOTE — Telephone Encounter (Signed)
returned pt call and confirmed appts....patient ok and aware

## 2014-03-30 ENCOUNTER — Telehealth: Payer: Self-pay | Admitting: Medical Oncology

## 2014-03-30 ENCOUNTER — Ambulatory Visit (HOSPITAL_BASED_OUTPATIENT_CLINIC_OR_DEPARTMENT_OTHER): Payer: Medicare HMO

## 2014-03-30 ENCOUNTER — Other Ambulatory Visit: Payer: Medicare HMO

## 2014-03-30 ENCOUNTER — Ambulatory Visit: Payer: Medicare HMO

## 2014-03-30 ENCOUNTER — Other Ambulatory Visit (HOSPITAL_BASED_OUTPATIENT_CLINIC_OR_DEPARTMENT_OTHER): Payer: Medicare HMO

## 2014-03-30 DIAGNOSIS — Z95828 Presence of other vascular implants and grafts: Secondary | ICD-10-CM

## 2014-03-30 DIAGNOSIS — C3431 Malignant neoplasm of lower lobe, right bronchus or lung: Secondary | ICD-10-CM

## 2014-03-30 DIAGNOSIS — Z5111 Encounter for antineoplastic chemotherapy: Secondary | ICD-10-CM | POA: Diagnosis not present

## 2014-03-30 LAB — COMPREHENSIVE METABOLIC PANEL (CC13)
ALBUMIN: 3.3 g/dL — AB (ref 3.5–5.0)
ALK PHOS: 88 U/L (ref 40–150)
ALT: 24 U/L (ref 0–55)
AST: 16 U/L (ref 5–34)
Anion Gap: 12 mEq/L — ABNORMAL HIGH (ref 3–11)
BUN: 17.8 mg/dL (ref 7.0–26.0)
CO2: 25 meq/L (ref 22–29)
Calcium: 9.8 mg/dL (ref 8.4–10.4)
Chloride: 104 mEq/L (ref 98–109)
Creatinine: 0.8 mg/dL (ref 0.7–1.3)
EGFR: 88 mL/min/{1.73_m2} — ABNORMAL LOW (ref >90)
Glucose: 160 mg/dl — ABNORMAL HIGH (ref 70–140)
POTASSIUM: 4.4 meq/L (ref 3.5–5.1)
SODIUM: 142 meq/L (ref 136–145)
TOTAL PROTEIN: 7.3 g/dL (ref 6.4–8.3)
Total Bilirubin: 0.26 mg/dL (ref 0.20–1.20)

## 2014-03-30 LAB — CBC WITH DIFFERENTIAL/PLATELET
BASO%: 0.3 % (ref 0.0–2.0)
BASOS ABS: 0 10*3/uL (ref 0.0–0.1)
EOS%: 2.6 % (ref 0.0–7.0)
Eosinophils Absolute: 0.3 10*3/uL (ref 0.0–0.5)
HEMATOCRIT: 39.4 % (ref 38.4–49.9)
HGB: 12.3 g/dL — ABNORMAL LOW (ref 13.0–17.1)
LYMPH%: 10.3 % — AB (ref 14.0–49.0)
MCH: 26.7 pg — ABNORMAL LOW (ref 27.2–33.4)
MCHC: 31.2 g/dL — AB (ref 32.0–36.0)
MCV: 85.5 fL (ref 79.3–98.0)
MONO#: 0.8 10*3/uL (ref 0.1–0.9)
MONO%: 7.9 % (ref 0.0–14.0)
NEUT#: 8.2 10*3/uL — ABNORMAL HIGH (ref 1.5–6.5)
NEUT%: 78.9 % — AB (ref 39.0–75.0)
Platelets: 363 10*3/uL (ref 140–400)
RBC: 4.61 10*6/uL (ref 4.20–5.82)
RDW: 17.4 % — ABNORMAL HIGH (ref 11.0–14.6)
WBC: 10.3 10*3/uL (ref 4.0–10.3)
lymph#: 1.1 10*3/uL (ref 0.9–3.3)

## 2014-03-30 MED ORDER — HEPARIN SOD (PORK) LOCK FLUSH 100 UNIT/ML IV SOLN
500.0000 [IU] | Freq: Once | INTRAVENOUS | Status: AC | PRN
  Administered 2014-03-30: 500 [IU]
  Filled 2014-03-30: qty 5

## 2014-03-30 MED ORDER — SODIUM CHLORIDE 0.9 % IJ SOLN
10.0000 mL | INTRAMUSCULAR | Status: DC | PRN
  Administered 2014-03-30: 10 mL via INTRAVENOUS
  Filled 2014-03-30: qty 10

## 2014-03-30 MED ORDER — SODIUM CHLORIDE 0.9 % IV SOLN
Freq: Once | INTRAVENOUS | Status: AC
  Administered 2014-03-30: 09:00:00 via INTRAVENOUS

## 2014-03-30 MED ORDER — PROCHLORPERAZINE MALEATE 10 MG PO TABS
10.0000 mg | ORAL_TABLET | Freq: Once | ORAL | Status: AC
  Administered 2014-03-30: 10 mg via ORAL

## 2014-03-30 MED ORDER — SODIUM CHLORIDE 0.9 % IV SOLN
2000.0000 mg | Freq: Once | INTRAVENOUS | Status: AC
  Administered 2014-03-30: 2000 mg via INTRAVENOUS
  Filled 2014-03-30: qty 52.6

## 2014-03-30 MED ORDER — SODIUM CHLORIDE 0.9 % IV SOLN: 1000.0000 mg/m2 | Freq: Once | INTRAVENOUS | Status: DC

## 2014-03-30 MED ORDER — SODIUM CHLORIDE 0.9 % IJ SOLN
10.0000 mL | INTRAMUSCULAR | Status: DC | PRN
  Administered 2014-03-30: 10 mL
  Filled 2014-03-30: qty 10

## 2014-03-30 MED ORDER — PROCHLORPERAZINE MALEATE 10 MG PO TABS
ORAL_TABLET | ORAL | Status: AC
  Filled 2014-03-30: qty 1

## 2014-03-30 NOTE — Telephone Encounter (Signed)
-----   Message from Doroteo Bradford, RN sent at 03/30/2014  9:34 AM EDT ----- Regarding: chemo follow-up Contact: [phone] [name].

## 2014-03-30 NOTE — Patient Instructions (Signed)

## 2014-03-30 NOTE — Telephone Encounter (Signed)
erroneous

## 2014-03-30 NOTE — Patient Instructions (Signed)
[name] Discharge Instructions for Patients Receiving Chemotherapy  Today you received the following chemotherapy agents [name]  To help prevent nausea and vomiting after your treatment, we encourage you to take your nausea medication as directed/prescribed   If you develop nausea and vomiting that is not controlled by your nausea medication, call the clinic.   BELOW ARE SYMPTOMS THAT SHOULD BE REPORTED IMMEDIATELY:  *FEVER GREATER THAN 100.5 F  *CHILLS WITH OR WITHOUT FEVER  NAUSEA AND VOMITING THAT IS NOT CONTROLLED WITH YOUR NAUSEA MEDICATION  *UNUSUAL SHORTNESS OF BREATH  *UNUSUAL BRUISING OR BLEEDING  TENDERNESS IN MOUTH AND THROAT WITH OR WITHOUT PRESENCE OF ULCERS  *URINARY PROBLEMS  *BOWEL PROBLEMS  UNUSUAL RASH Items with * indicate a potential emergency and should be followed up as soon as possible.  Feel free to call the clinic you have any questions or concerns. The clinic phone number is [phone].  Please show the [facility] at check-in to the Emergency Department and triage nurse.  Gemcitabine injection What is this medicine? GEMCITABINE (jem SIT a been) is a chemotherapy drug. This medicine is used to treat many types of cancer like breast cancer, lung cancer, pancreatic cancer, and ovarian cancer. This medicine may be used for other purposes; ask your health care provider or pharmacist if you have questions. COMMON BRAND NAME(S): [name] What should I tell my health care provider before I take this medicine? They need to know if you have any of these conditions: -blood disorders -infection -kidney disease -liver disease -recent or ongoing radiation therapy -an unusual or allergic reaction to gemcitabine, other chemotherapy, other medicines, foods, dyes, or preservatives -pregnant or trying to get pregnant -breast-feeding How should I use this medicine? This drug is given as an infusion into a vein. It is administered in a  hospital or clinic by a specially trained health care professional. Talk to your pediatrician regarding the use of this medicine in children. Special care may be needed. Overdosage: If you think you have taken too much of this medicine contact a poison control center or emergency room at once. NOTE: This medicine is only for you. Do not share this medicine with others. What if I miss a dose? It is important not to miss your dose. Call your doctor or health care professional if you are unable to keep an appointment. What may interact with this medicine? -medicines to increase blood counts like filgrastim, pegfilgrastim, sargramostim -some other chemotherapy drugs like cisplatin -vaccines Talk to your doctor or health care professional before taking any of these medicines: -acetaminophen -aspirin -ibuprofen -ketoprofen -naproxen This list may not describe all possible interactions. Give your health care provider a list of all the medicines, herbs, non-prescription drugs, or dietary supplements you use. Also tell them if you smoke, drink alcohol, or use illegal drugs. Some items may interact with your medicine. What should I watch for while using this medicine? Visit your doctor for checks on your progress. This drug may make you feel generally unwell. This is not uncommon, as chemotherapy can affect healthy cells as well as cancer cells. Report any side effects. Continue your course of treatment even though you feel ill unless your doctor tells you to stop. In some cases, you may be given additional medicines to help with side effects. Follow all directions for their use. Call your doctor or health care professional for advice if you get a fever, chills or sore throat, or other symptoms of a cold or flu.  Do not treat yourself. This drug decreases your body's ability to fight infections. Try to avoid being around people who are sick. This medicine may increase your risk to bruise or bleed. Call  your doctor or health care professional if you notice any unusual bleeding. Be careful brushing and flossing your teeth or using a toothpick because you may get an infection or bleed more easily. If you have any dental work done, tell your dentist you are receiving this medicine. Avoid taking products that contain aspirin, acetaminophen, ibuprofen, naproxen, or ketoprofen unless instructed by your doctor. These medicines may hide a fever. Women should inform their doctor if they wish to become pregnant or think they might be pregnant. There is a potential for serious side effects to an unborn child. Talk to your health care professional or pharmacist for more information. Do not breast-feed an infant while taking this medicine. What side effects may I notice from receiving this medicine? Side effects that you should report to your doctor or health care professional as soon as possible: -allergic reactions like skin rash, itching or hives, swelling of the face, lips, or tongue -low blood counts - this medicine may decrease the number of [place] blood cells, red blood cells and platelets. You may be at increased risk for infections and bleeding. -signs of infection - fever or chills, cough, sore throat, pain or difficulty passing urine -signs of decreased platelets or bleeding - bruising, pinpoint red spots on the skin, black, tarry stools, blood in the urine -signs of decreased red blood cells - unusually weak or tired, fainting spells, lightheadedness -breathing problems -chest pain -mouth sores -nausea and vomiting -pain, swelling, redness at site where injected -pain, tingling, numbness in the hands or feet -stomach pain -swelling of ankles, feet, hands -unusual bleeding Side effects that usually do not require medical attention (report to your doctor or health care professional if they continue or are bothersome): -constipation -diarrhea -hair loss -loss of appetite -stomach upset This  list may not describe all possible side effects. Call your doctor for medical advice about side effects. You may report side effects to FDA at 1-800-FDA-1088. Where should I keep my medicine? This drug is given in a hospital or clinic and will not be stored at home. NOTE: This sheet is a summary. It may not cover all possible information. If you have questions about this medicine, talk to your doctor, pharmacist, or health care provider.  2015, [employer]vier/[employer] Standard. (2007-05-06 18:45:54)

## 2014-03-31 ENCOUNTER — Telehealth: Payer: Self-pay | Admitting: Medical Oncology

## 2014-03-31 NOTE — Telephone Encounter (Signed)
Feels a little nauseated. Taking antiemetic. "Im okay "

## 2014-03-31 NOTE — Telephone Encounter (Signed)
-----   Message from Ardeen Garland, RN sent at 03/30/2014 11:59 AM EDT ----- Regarding: FW: chemo follow-up Contact: [phone]   ----- Message -----    From: Doroteo Bradford, RN    Sent: 03/30/2014   9:34 AM      To: Onc Triage Nurse [facility] Subject: chemo follow-up                                [name].

## 2014-04-06 ENCOUNTER — Other Ambulatory Visit (HOSPITAL_BASED_OUTPATIENT_CLINIC_OR_DEPARTMENT_OTHER): Payer: Medicare HMO

## 2014-04-06 ENCOUNTER — Ambulatory Visit (HOSPITAL_BASED_OUTPATIENT_CLINIC_OR_DEPARTMENT_OTHER): Payer: Medicare HMO

## 2014-04-06 ENCOUNTER — Other Ambulatory Visit: Payer: Medicare HMO

## 2014-04-06 ENCOUNTER — Ambulatory Visit: Payer: Medicare HMO

## 2014-04-06 VITALS — BP 103/63 | HR 87 | Temp 97.8°F | Resp 18

## 2014-04-06 DIAGNOSIS — Z5111 Encounter for antineoplastic chemotherapy: Secondary | ICD-10-CM | POA: Diagnosis not present

## 2014-04-06 DIAGNOSIS — Z95828 Presence of other vascular implants and grafts: Secondary | ICD-10-CM

## 2014-04-06 DIAGNOSIS — C3431 Malignant neoplasm of lower lobe, right bronchus or lung: Secondary | ICD-10-CM | POA: Diagnosis not present

## 2014-04-06 LAB — CBC WITH DIFFERENTIAL/PLATELET
BASO%: 0.7 % (ref 0.0–2.0)
Basophils Absolute: 0 10*3/uL (ref 0.0–0.1)
EOS%: 1 % (ref 0.0–7.0)
Eosinophils Absolute: 0 10*3/uL (ref 0.0–0.5)
HCT: 36.6 % — ABNORMAL LOW (ref 38.4–49.9)
HGB: 11.6 g/dL — ABNORMAL LOW (ref 13.0–17.1)
LYMPH#: 0.8 10*3/uL — AB (ref 0.9–3.3)
LYMPH%: 15.8 % (ref 14.0–49.0)
MCH: 26.1 pg — ABNORMAL LOW (ref 27.2–33.4)
MCHC: 31.5 g/dL — ABNORMAL LOW (ref 32.0–36.0)
MCV: 82.9 fL (ref 79.3–98.0)
MONO#: 0.5 10*3/uL (ref 0.1–0.9)
MONO%: 10.6 % (ref 0.0–14.0)
NEUT#: 3.5 10*3/uL (ref 1.5–6.5)
NEUT%: 71.9 % (ref 39.0–75.0)
PLATELETS: 286 10*3/uL (ref 140–400)
RBC: 4.42 10*6/uL (ref 4.20–5.82)
RDW: 17.8 % — ABNORMAL HIGH (ref 11.0–14.6)
WBC: 4.8 10*3/uL (ref 4.0–10.3)

## 2014-04-06 LAB — COMPREHENSIVE METABOLIC PANEL (CC13)
ALT: 34 U/L (ref 0–55)
AST: 26 U/L (ref 5–34)
Albumin: 3.2 g/dL — ABNORMAL LOW (ref 3.5–5.0)
Alkaline Phosphatase: 82 U/L (ref 40–150)
Anion Gap: 11 mEq/L (ref 3–11)
BILIRUBIN TOTAL: 0.29 mg/dL (ref 0.20–1.20)
BUN: 20.3 mg/dL (ref 7.0–26.0)
CO2: 24 mEq/L (ref 22–29)
CREATININE: 0.8 mg/dL (ref 0.7–1.3)
Calcium: 9.7 mg/dL (ref 8.4–10.4)
Chloride: 106 mEq/L (ref 98–109)
EGFR: 88 mL/min/{1.73_m2} — ABNORMAL LOW (ref >90)
GLUCOSE: 144 mg/dL — AB (ref 70–140)
POTASSIUM: 4.2 meq/L (ref 3.5–5.1)
Sodium: 141 mEq/L (ref 136–145)
TOTAL PROTEIN: 7.4 g/dL (ref 6.4–8.3)

## 2014-04-06 MED ORDER — SODIUM CHLORIDE 0.9 % IV SOLN
Freq: Once | INTRAVENOUS | Status: AC
  Administered 2014-04-06: 09:00:00 via INTRAVENOUS

## 2014-04-06 MED ORDER — SODIUM CHLORIDE 0.9 % IV SOLN
2000.0000 mg | Freq: Once | INTRAVENOUS | Status: AC
  Administered 2014-04-06: 2000 mg via INTRAVENOUS
  Filled 2014-04-06: qty 52.6

## 2014-04-06 MED ORDER — SODIUM CHLORIDE 0.9 % IJ SOLN
10.0000 mL | INTRAMUSCULAR | Status: DC | PRN
  Administered 2014-04-06: 10 mL
  Filled 2014-04-06: qty 10

## 2014-04-06 MED ORDER — SODIUM CHLORIDE 0.9 % IJ SOLN
10.0000 mL | INTRAMUSCULAR | Status: DC | PRN
  Administered 2014-04-06: 10 mL via INTRAVENOUS
  Filled 2014-04-06: qty 10

## 2014-04-06 MED ORDER — PROCHLORPERAZINE MALEATE 10 MG PO TABS
ORAL_TABLET | ORAL | Status: AC
  Filled 2014-04-06: qty 1

## 2014-04-06 MED ORDER — PROCHLORPERAZINE MALEATE 10 MG PO TABS
10.0000 mg | ORAL_TABLET | Freq: Once | ORAL | Status: AC
  Administered 2014-04-06: 10 mg via ORAL

## 2014-04-06 MED ORDER — HEPARIN SOD (PORK) LOCK FLUSH 100 UNIT/ML IV SOLN
500.0000 [IU] | Freq: Once | INTRAVENOUS | Status: AC | PRN
  Administered 2014-04-06: 500 [IU]
  Filled 2014-04-06: qty 5

## 2014-04-06 NOTE — Patient Instructions (Signed)

## 2014-04-06 NOTE — Patient Instructions (Signed)
[facility] Discharge Instructions for Patients Receiving Chemotherapy  Today you received the following chemotherapy agents: [name]  To help prevent nausea and vomiting after your treatment, we encourage you to take your nausea medication as prescribed by your physician.    If you develop nausea and vomiting that is not controlled by your nausea medication, call the clinic.   BELOW ARE SYMPTOMS THAT SHOULD BE REPORTED IMMEDIATELY:  *FEVER GREATER THAN 100.5 F  *CHILLS WITH OR WITHOUT FEVER  NAUSEA AND VOMITING THAT IS NOT CONTROLLED WITH YOUR NAUSEA MEDICATION  *UNUSUAL SHORTNESS OF BREATH  *UNUSUAL BRUISING OR BLEEDING  TENDERNESS IN MOUTH AND THROAT WITH OR WITHOUT PRESENCE OF ULCERS  *URINARY PROBLEMS  *BOWEL PROBLEMS  UNUSUAL RASH Items with * indicate a potential emergency and should be followed up as soon as possible.  Feel free to call the clinic you have any questions or concerns. The clinic phone number is [phone].  Please show the [facility] at check-in to the Emergency Department and triage nurse.

## 2014-04-14 ENCOUNTER — Telehealth: Payer: Self-pay | Admitting: Internal Medicine

## 2014-04-14 NOTE — Telephone Encounter (Signed)
Called Mr. Simao on 04/14/2014 to make appointment to go over his labs. Mr. Morten stated that he has lung cancer and has to go to treatments and just does not feel like coming in at this time. He stated that he would call and schedule an appointment after he finishes his treatment.

## 2014-04-20 ENCOUNTER — Telehealth: Payer: Self-pay | Admitting: *Deleted

## 2014-04-20 ENCOUNTER — Ambulatory Visit: Payer: Medicare HMO

## 2014-04-20 ENCOUNTER — Encounter: Payer: Self-pay | Admitting: Physician Assistant

## 2014-04-20 ENCOUNTER — Ambulatory Visit (HOSPITAL_BASED_OUTPATIENT_CLINIC_OR_DEPARTMENT_OTHER): Payer: Medicare HMO

## 2014-04-20 ENCOUNTER — Ambulatory Visit (HOSPITAL_BASED_OUTPATIENT_CLINIC_OR_DEPARTMENT_OTHER): Payer: Medicare HMO | Admitting: Physician Assistant

## 2014-04-20 ENCOUNTER — Other Ambulatory Visit (HOSPITAL_BASED_OUTPATIENT_CLINIC_OR_DEPARTMENT_OTHER): Payer: Medicare HMO

## 2014-04-20 VITALS — BP 117/72 | HR 88 | Temp 97.6°F | Resp 18 | Ht 72.0 in | Wt 182.6 lb

## 2014-04-20 DIAGNOSIS — Z79899 Other long term (current) drug therapy: Secondary | ICD-10-CM | POA: Diagnosis not present

## 2014-04-20 DIAGNOSIS — C3431 Malignant neoplasm of lower lobe, right bronchus or lung: Secondary | ICD-10-CM | POA: Diagnosis not present

## 2014-04-20 DIAGNOSIS — Z95828 Presence of other vascular implants and grafts: Secondary | ICD-10-CM

## 2014-04-20 DIAGNOSIS — Z5111 Encounter for antineoplastic chemotherapy: Secondary | ICD-10-CM | POA: Diagnosis not present

## 2014-04-20 LAB — COMPREHENSIVE METABOLIC PANEL (CC13)
ALBUMIN: 3.3 g/dL — AB (ref 3.5–5.0)
ALT: 50 U/L (ref 0–55)
AST: 34 U/L (ref 5–34)
Alkaline Phosphatase: 107 U/L (ref 40–150)
Anion Gap: 10 mEq/L (ref 3–11)
BILIRUBIN TOTAL: 0.27 mg/dL (ref 0.20–1.20)
BUN: 15.1 mg/dL (ref 7.0–26.0)
CO2: 28 mEq/L (ref 22–29)
Calcium: 9.4 mg/dL (ref 8.4–10.4)
Chloride: 106 mEq/L (ref 98–109)
Creatinine: 0.8 mg/dL (ref 0.7–1.3)
EGFR: >90 mL/min/{1.73_m2} (ref >90)
Glucose: 138 mg/dl (ref 70–140)
POTASSIUM: 4.3 meq/L (ref 3.5–5.1)
Sodium: 144 mEq/L (ref 136–145)
Total Protein: 7.5 g/dL (ref 6.4–8.3)

## 2014-04-20 LAB — CBC WITH DIFFERENTIAL/PLATELET
BASO%: 0.6 % (ref 0.0–2.0)
BASOS ABS: 0 10*3/uL (ref 0.0–0.1)
EOS ABS: 0.1 10*3/uL (ref 0.0–0.5)
EOS%: 1.1 % (ref 0.0–7.0)
HEMATOCRIT: 36.2 % — AB (ref 38.4–49.9)
HEMOGLOBIN: 11.6 g/dL — AB (ref 13.0–17.1)
LYMPH%: 11.2 % — ABNORMAL LOW (ref 14.0–49.0)
MCH: 26.9 pg — AB (ref 27.2–33.4)
MCHC: 32 g/dL (ref 32.0–36.0)
MCV: 83.8 fL (ref 79.3–98.0)
MONO#: 0.8 10*3/uL (ref 0.1–0.9)
MONO%: 9.3 % (ref 0.0–14.0)
NEUT#: 6.5 10*3/uL (ref 1.5–6.5)
NEUT%: 77.8 % — AB (ref 39.0–75.0)
PLATELETS: 466 10*3/uL — AB (ref 140–400)
RBC: 4.31 10*6/uL (ref 4.20–5.82)
RDW: 18 % — ABNORMAL HIGH (ref 11.0–14.6)
WBC: 8.3 10*3/uL (ref 4.0–10.3)
lymph#: 0.9 10*3/uL (ref 0.9–3.3)

## 2014-04-20 LAB — TSH CHCC: TSH: 2.666 m(IU)/L (ref 0.320–4.118)

## 2014-04-20 MED ORDER — PROCHLORPERAZINE MALEATE 10 MG PO TABS
ORAL_TABLET | ORAL | Status: AC
Start: 2014-04-20 — End: 2014-04-20
  Filled 2014-04-20: qty 1

## 2014-04-20 MED ORDER — SODIUM CHLORIDE 0.9 % IJ SOLN
10.0000 mL | INTRAMUSCULAR | Status: DC | PRN
Start: 2014-04-20 — End: 2014-04-20
  Administered 2014-04-20: 10 mL via INTRAVENOUS
  Filled 2014-04-20: qty 10

## 2014-04-20 MED ORDER — SODIUM CHLORIDE 0.9 % IJ SOLN
10.0000 mL | INTRAMUSCULAR | Status: DC | PRN
  Administered 2014-04-20: 10 mL
  Filled 2014-04-20: qty 10

## 2014-04-20 MED ORDER — SODIUM CHLORIDE 0.9 % IV SOLN
2000.0000 mg | Freq: Once | INTRAVENOUS | Status: AC
  Administered 2014-04-20: 2000 mg via INTRAVENOUS
  Filled 2014-04-20: qty 52.6

## 2014-04-20 MED ORDER — HEPARIN SOD (PORK) LOCK FLUSH 100 UNIT/ML IV SOLN
500.0000 [IU] | Freq: Once | INTRAVENOUS | Status: DC | PRN
  Filled 2014-04-20: qty 5

## 2014-04-20 MED ORDER — PROCHLORPERAZINE MALEATE 10 MG PO TABS
10.0000 mg | ORAL_TABLET | Freq: Once | ORAL | Status: AC
  Administered 2014-04-20: 10 mg via ORAL

## 2014-04-20 MED ORDER — OXYCODONE-ACETAMINOPHEN 5-325 MG PO TABS: 1.0000 | ORAL_TABLET | Freq: Four times a day (QID) | ORAL | Status: DC | PRN

## 2014-04-20 MED ORDER — SODIUM CHLORIDE 0.9 % IV SOLN
Freq: Once | INTRAVENOUS | Status: AC
  Administered 2014-04-20: 11:00:00 via INTRAVENOUS

## 2014-04-20 NOTE — Telephone Encounter (Signed)
Per staff message and POF I have scheduled appts. Advised scheduler of appts. JMW  

## 2014-04-20 NOTE — Patient Instructions (Signed)
Do not exceed 2-3 tablets of Aleve in a 24-hour period Continue labs and chemotherapy as scheduled Follow-up in 3 weeks prior to the start of your next scheduled cycle of chemotherapy

## 2014-04-20 NOTE — Progress Notes (Addendum)
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   Primary site: Lung (Right)  Staging method: AJCC 7th Edition  Clinical: Stage IIIA (T2b, N2, M0)  Summary: Stage IIIA (T2b, N2, M0)   PRIOR THERAPY:  1) Concurrent chemoradiation with weekly carboplatin for an AUC of 2 and paclitaxel 45 mg/m2, status post 4 cycles. 2) Consolidation chemotherapy with carboplatin for AUC of 5 and paclitaxel 175 mg/M2 every 3 weeks with Neulasta support, first cycle on 03/23/2013. Status post 3 cycles. 3) Immunotherapy with Nivolumab 3 mg/KG every 2 weeks. First dose 09/21/2013. Status post 12 cycles.   CURRENT THERAPY: Systemic chemotherapy with gemcitabine 1000 MG/M2 on days 1 and 8 every 3 weeks. First dose 03/30/2014. Status post 1 cycle  DISEASE STAGE:  Lung cancer  Primary site: Lung (Right)  Staging method: AJCC 7th Edition  Clinical: Stage IIIA (T2b, N2, M0)  Summary: Stage IIIA (T2b, N2, M0)  CHEMOTHERAPY INTENT: Control/Palliative  CURRENT # OF CHEMOTHERAPY CYCLES: 2 CURRENT ANTIEMETICS: Zofran, dexamethasone, Compazine  CURRENT SMOKING STATUS: Former smoker, quit 01/09/2007  ORAL CHEMOTHERAPY AND CONSENT: n/a  CURRENT BISPHOSPHONATES USE: none  PAIN MANAGEMENT: Ibuprofen  NARCOTICS INDUCED CONSTIPATION: None  LIVING WILL AND CODE STATUS: ?  INTERVAL HISTORY: LEDARRIUS [name] 74 y.o. male returns to the clinic today for followup visit.  He is currently being treated with systemic chemotherapy with gemcitabine 1000 mg/m given on days 1 and 8 every 3 weeks, status post 1 cycle. He tolerated his first cycle of gemcitabine relatively well. He reports muscle aches  and joint aches that he is currently managing with a combination of Aleve and oxycodone. He takes one Aleve tablet in the morning and 2 tablets in the evening with one oxycodone before bed with good control. He also takes his gabapentin at bedtime. He requests that we take over the writing of his pain medications from his primary care physician at this time for convenience. He denied having any significant chest pain but continues to have shortness of breath with exertion with mild cough without hemoptysis. He reports a dull ache in his throat extending down his esophagus associated with some belching. He reports that he takes an occasional Zantac with some relief. He recalls that he was taking omeprazole when he was receiving radiation therapy with good control of the symptoms. I recommended that he restart the omeprazole, once daily 30 minutes to an hour before his first meal of the day. Patient voiced understanding. He denied having any weight loss or night sweats. The patient has no fever or chills and no nausea or vomiting.   MEDICAL HISTORY: Past Medical History  Diagnosis Date  . Dizziness and giddiness     positional vertigo  . Hyperlipidemia   . Impotence of organic origin   . Osteoarthritis, shoulder   . Allergy   . Hypothyroidism     pt denies thryoid disease, dr pandey note 11-19-12 says subclinical hypothryoid in epic  . COPD (chronic obstructive pulmonary disease)   . Insomnia   . Hx of radiation therapy 02/2013    right lung  . Diabetes mellitus without complication     pt denies diabetes, dr pandey note says dm 11-19-12  epic  . 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 Malignant neoplasm of bladder, part unspecified   . Malignant neoplasm of bronchus and lung, unspecified site     ALLERGIES:  is allergic to morphine and related.  MEDICATIONS:  Current Outpatient Prescriptions  Medication Sig Dispense Refill  . aspirin 81 MG tablet Take 81 mg by mouth daily.     . DULoxetine (CYMBALTA) 60 MG capsule Take 1 capsule (60 mg total) by mouth daily. 30 capsule 2  . flecainide (TAMBOCOR) 50 MG tablet Take 50 mg by mouth.  6  . gabapentin (NEURONTIN) 300 MG capsule TAKE 1 CAPSULE BY MOUTH THREE TIMES DAILY (Patient taking differently: TAKE 1 CAPSULE BY MOUTH TWICE DAILY) 270 capsule 0  . metoprolol tartrate (LOPRESSOR) 25 MG tablet Take 1 tablet (25 mg total) by mouth 2 (two) times daily. 90 tablet 1  . midodrine (PROAMATINE) 2.5 MG tablet Take 1 tablet (2.5 mg total) by mouth daily. 90 tablet 5  . naproxen sodium (ANAPROX) 220 MG tablet Take 220 mg by mouth 2 (two) times daily with a meal.    . oxyCODONE-acetaminophen (ROXICET) 5-325 MG per tablet Take 1 tablet by mouth every 6 (six) hours as needed for severe pain. 120 tablet 0  . simvastatin (ZOCOR) 20 MG tablet Take one tablet by mouth once daily to lower cholesterol 90 tablet 3  . temazepam (RESTORIL) 30 MG capsule Take 1 capsule (30 mg total) by mouth at bedtime as needed for sleep. 30 capsule 0  . oxyCODONE-acetaminophen (PERCOCET/ROXICET) 5-325 MG per tablet Take 1 tablet by mouth every 6 (six) hours as needed for severe pain. 60 tablet 0   No current facility-administered medications for this visit.    SURGICAL HISTORY:  Past Surgical History  Procedure Laterality Date  . Cystoscopy w/ dilation of bladder    . Tonsillectomy  age 70  . Endobronchial ultrasound Bilateral 12/15/2012    Procedure: ENDOBRONCHIAL ULTRASOUND;  Surgeon: Brand Males, MD;  Location: [facility];  Service: Cardiopulmonary;  Laterality: Bilateral;  . Colonoscopy  2009    [facility]    REVIEW OF SYSTEMS:  Constitutional: negative Eyes: negative Ears, nose, mouth, throat, and face: negative Respiratory: positive for cough and dyspnea on exertion Cardiovascular: negative Gastrointestinal: positive for reflux symptoms Genitourinary:negative Integument/breast: negative Hematologic/lymphatic:  negative Musculoskeletal:negative Neurological: positive for paresthesia Behavioral/Psych: negative Endocrine: negative Allergic/Immunologic: negative   PHYSICAL EXAMINATION: General appearance: alert, cooperative and no distress Head: Normocephalic, without obvious abnormality, atraumatic Neck: no adenopathy, no JVD, supple, symmetrical, trachea midline and thyroid not enlarged, symmetric, no tenderness/mass/nodules Lymph nodes: Cervical, supraclavicular, and axillary nodes normal. Resp: clear to auscultation bilaterally Back: symmetric, no curvature. ROM normal. No CVA tenderness. Cardio: regular rate and rhythm, S1, S2 normal, no murmur, click, rub or gallop GI: soft, non-tender; bowel sounds normal; no masses,  no organomegaly Extremities: extremities normal, atraumatic, no cyanosis or edema Neurologic: Alert and oriented X 3, normal strength and tone. Normal symmetric reflexes. Normal coordination and gait  ECOG PERFORMANCE STATUS: 1 - Symptomatic but completely ambulatory  Blood pressure 117/72, pulse 88, temperature 97.6 F (36.4 C), temperature source Oral, resp. rate 18, height 6' (1.829 m), weight 182 lb 9.6 oz (82.827 kg), SpO2 99 %.  LABORATORY DATA: Lab Results  Component Value Date   WBC 8.3 04/20/2014   HGB 11.6* 04/20/2014   HCT 36.2* 04/20/2014   MCV 83.8 04/20/2014   PLT 466* 04/20/2014      Chemistry  Component Value Date/Time   NA 144 04/20/2014 0937   NA 139 11/16/2013 0755   NA 142 04/28/2013 0819   K 4.3 04/20/2014 0937   K 4.1 11/16/2013 0755   CL 100 11/16/2013 0755   CO2 28 04/20/2014 0937   CO2 23 11/16/2013 0755   BUN 15.1 04/20/2014 0937   BUN 12 11/16/2013 0755   BUN 12 04/28/2013 0819   CREATININE 0.8 04/20/2014 0937   CREATININE 0.73 11/16/2013 0755      Component Value Date/Time   CALCIUM 9.4 04/20/2014 0937   CALCIUM 10.0 11/16/2013 0755   ALKPHOS 107 04/20/2014 0937   ALKPHOS 107 04/28/2013 0819   AST 34 04/20/2014 0937    AST 19 04/28/2013 0819   ALT 50 04/20/2014 0937   ALT 12 04/28/2013 0819   BILITOT 0.27 04/20/2014 0937   BILITOT 0.4 04/28/2013 0819       RADIOGRAPHIC STUDIES: No results found. ASSESSMENT AND PLAN: This is a very pleasant 74 years old [place] male with:   1) metastatic non-small cell lung cancer initially diagnosed as stage IIIA non-small cell lung cancer who completed a course of concurrent chemoradiation with weekly carboplatin and paclitaxel with no significant complaints except for radiation induced odynophagia.  He also completed 3 cycles of consolidation chemotherapy with reduced dose carboplatin and paclitaxel. He completed 12 cycles of immunotherapy with Nivolumab and tolerated his treatment fairly well. Unfortunately the recent CT scan of the chest showed further increase in the size of the subcarinal nodal mass consistent with disease progression. He is currently being treated with systemic chemotherapy in the form of single agent gemcitabine at 1000 mg/m given on days 1 and 8 every 3 weeks, status post 1 cycle. Patient was discussed with and also seen by Dr. Julien Nordmann. He'll proceed with cycle #2 today as scheduled. He is cautioned not to exceed more than 2-3 Aleve tablets in a 24-hour period. I've given him a prescription for his Percocet 5/325 mg tablets, one tablet by mouth every 6 hours as needed for pain, total 60 tablets with no refill. As discussed above and recommended that he resume his omeprazole, 30 minutes to 1 hour before his first meal of the day. He voiced understanding. He will have labs on days of treatment and will follow-up in 3 weeks prior to the start of cycle #3.   He was advised to call immediately if he has any concerning symptoms in the interval.  The patient voices understanding of current disease status and treatment options and is in agreement with the current care plan.  All questions were answered. The patient knows to call the clinic with any problems,  questions or concerns. We can certainly see the patient much sooner if necessary.  Carlton Adam, PA-C 04/20/2014  ADDENDUM: Hematology/Oncology Attending: I had a face to face encounter with the patient today. I recommended his care plan. This is a very pleasant 74 years old [place] male with metastatic non-small cell lung cancer, squamous cell carcinoma status post several chemotherapy regimens as well as immunotherapy and currently undergoing treatment with single agent gemcitabine status post 1 cycle. He tolerated the first cycle of his treatment well with no significant adverse effects. The patient denied having any significant alopecia, nausea and vomiting, fever or chills. He has no significant chest pain but has shortness of breath increased with exertion. He has been complaining of muscle aches for several months and he takes Aleve in addition to oxycodone as needed. I recommended for  the patient to proceed with cycle #2 today as scheduled. He will come back for follow-up visit in 3 weeks with the next cycle of his treatment. He was advised to call immediately if he has any concerning symptoms in the interval.  Disclaimer: This note was dictated with voice recognition software. Similar sounding words can inadvertently be transcribed and may not be corrected upon review. Eilleen Kempf., MD 04/20/2014

## 2014-04-20 NOTE — Patient Instructions (Signed)

## 2014-04-20 NOTE — Patient Instructions (Signed)
[facility] Discharge Instructions for Patients Receiving Chemotherapy  Today you received the following chemotherapy agents gemzar  To help prevent nausea and vomiting after your treatment, we encourage you to take your nausea medication as directed.  If you develop nausea and vomiting that is not controlled by your nausea medication, call the clinic.   BELOW ARE SYMPTOMS THAT SHOULD BE REPORTED IMMEDIATELY:  *FEVER GREATER THAN 100.5 F  *CHILLS WITH OR WITHOUT FEVER  NAUSEA AND VOMITING THAT IS NOT CONTROLLED WITH YOUR NAUSEA MEDICATION  *UNUSUAL SHORTNESS OF BREATH  *UNUSUAL BRUISING OR BLEEDING  TENDERNESS IN MOUTH AND THROAT WITH OR WITHOUT PRESENCE OF ULCERS  *URINARY PROBLEMS  *BOWEL PROBLEMS  UNUSUAL RASH Items with * indicate a potential emergency and should be followed up as soon as possible.  Feel free to call the clinic you have any questions or concerns. The clinic phone number is [phone].  

## 2014-04-22 ENCOUNTER — Telehealth: Payer: Self-pay | Admitting: Internal Medicine

## 2014-04-22 NOTE — Telephone Encounter (Signed)
Pt confirmed labs/ov per 04/12 POF, gave pt AVS and Calendar..... KJ sent msg to add chemo

## 2014-04-23 ENCOUNTER — Ambulatory Visit (INDEPENDENT_AMBULATORY_CARE_PROVIDER_SITE_OTHER): Payer: Medicare HMO | Admitting: Internal Medicine

## 2014-04-23 ENCOUNTER — Telehealth: Payer: Self-pay | Admitting: *Deleted

## 2014-04-23 ENCOUNTER — Other Ambulatory Visit: Payer: Self-pay | Admitting: Medical Oncology

## 2014-04-23 ENCOUNTER — Encounter: Payer: Self-pay | Admitting: Internal Medicine

## 2014-04-23 VITALS — BP 114/68 | HR 91 | Ht 72.0 in | Wt 178.1 lb

## 2014-04-23 DIAGNOSIS — I471 Supraventricular tachycardia, unspecified: Secondary | ICD-10-CM

## 2014-04-23 DIAGNOSIS — R42 Dizziness and giddiness: Secondary | ICD-10-CM | POA: Diagnosis not present

## 2014-04-23 NOTE — Telephone Encounter (Signed)
I have called and gave the patient his appts for Tuesday

## 2014-04-23 NOTE — Telephone Encounter (Signed)
Called patient to verify his heart medications after visit with Dr. Harrington Challenger today. He was uncertain what he was taking and how much when he was in the office.  Reviewed with Dr. Harrington Challenger and informed patient to stop midodrine 2.5 mg daily and call back in a week to inform whether he experiences dizziness. Pt verbalizes understanding and agreement.

## 2014-04-23 NOTE — Patient Instructions (Signed)
WE WILL CALL YOU LATER TODAY TO REVIEW YOUR MEDICATIONS.  PLEASE HAVE YOUR MEDICINE BOTTLES AVAILABLE TO LOOK AT WHEN WE TALK.  Your physician wants you to follow-up in: [place].  You will receive a reminder letter in the mail two months in advance. If you don't receive a letter, please call our office to schedule the follow-up appointment.

## 2014-04-23 NOTE — Progress Notes (Signed)
Cardiology Office Note   Date:  04/23/2014   ID:  Steve [name], DOB Apr 21, 1940, MRN [id number]  PCP:  Steve Cranker, DO  Cardiologist:   Steve Carnes, MD   No chief complaint on file.     History of Present Illness: Steve [name] is a 74 y.o. male with a history of  Patient is a 74 yo who has a history of intermitt atrial fib. When I saw him I recomm a holter This showed SR/SB and afib Average HR was 90  Echo was also done that was normal He was not felt to be a candidate for coumadin with oncologic problems Earlier this fall I saw him with Steve [name] He had 4 syncopal spells He was orhtostatic on exam. Placed on low dose midodrin Set up for event monitor  Monitor company called He was in SVT at 250 bpm Broke spontaneously  I placed him on Flecanide He felt better afrter this GXT done on 1/7 No arrhythmia   Since seen he has had a little dizzinss with standing  No spells like prior  There is some confusino re meds/dosing  He is now on another type of chemotherapy for lung CA  Rigorous  The day of and day after treatment is hard for him    Current Outpatient Prescriptions  Medication Sig Dispense Refill  . aspirin 81 MG tablet Take 81 mg by mouth daily.    . flecainide (TAMBOCOR) 50 MG tablet Take 50 mg by mouth.  6  . gabapentin (NEURONTIN) 300 MG capsule TAKE 1 CAPSULE BY MOUTH THREE TIMES DAILY (Patient taking differently: TAKE 1 CAPSULE BY MOUTH TWICE DAILY) 270 capsule 0  . metoprolol tartrate (LOPRESSOR) 25 MG tablet Take 1 tablet (25 mg total) by mouth 2 (two) times daily. 90 tablet 1  . midodrine (PROAMATINE) 2.5 MG tablet Take 1 tablet (2.5 mg total) by mouth daily. 90 tablet 5  . naproxen sodium (ANAPROX) 220 MG tablet Take 220 mg by mouth 2 (two) times daily with a meal.    . oxyCODONE-acetaminophen (ROXICET) 5-325 MG per tablet Take 1 tablet by mouth every 6 (six) hours as needed for severe pain. 120 tablet 0  . simvastatin (ZOCOR) 20 MG  tablet Take one tablet by mouth once daily to lower cholesterol 90 tablet 3  . temazepam (RESTORIL) 30 MG capsule Take 1 capsule (30 mg total) by mouth at bedtime as needed for sleep. 30 capsule 0   No current facility-administered medications for this visit.    Allergies:   Morphine and related   Past Medical History  Diagnosis Date  . Dizziness and giddiness     positional vertigo  . Hyperlipidemia   . Impotence of organic origin   . Osteoarthritis, shoulder   . Allergy   . Hypothyroidism     pt denies thryoid disease, dr pandey note 11-19-12 says subclinical hypothryoid in epic  . COPD (chronic obstructive pulmonary disease)   . Insomnia   . Hx of radiation therapy 02/2013    right lung  . Diabetes mellitus without complication     pt denies diabetes, dr pandey note says dm 11-19-12 epic  . 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 Malignant neoplasm of bladder, part unspecified   . Malignant neoplasm of bronchus and lung, unspecified site     Past Surgical History  Procedure Laterality Date  . Cystoscopy w/ dilation of bladder    . Tonsillectomy  age 8  .  Endobronchial ultrasound Bilateral 12/15/2012    Procedure: ENDOBRONCHIAL ULTRASOUND;  Surgeon: Steve Males, MD;  Location: [facility];  Service: Cardiopulmonary;  Laterality: Bilateral;  . Colonoscopy  2009    [facility]     Social History:  The patient  reports that he quit smoking about 7 years ago. His smoking use included Cigarettes. He has a 75 pack-year smoking history. His smokeless tobacco use includes Chew. He reports that he drinks alcohol. He reports that he does not use illicit drugs.   Family History:  The patient's family history includes Kidney disease in his brother.    ROS:  Please see the history of present illness. All other systems are reviewed and  Negative to the above problem except as noted.    PHYSICAL EXAM: VS:  BP 114/68 mmHg  Pulse 91  Ht 6' (1.829 m)  Wt 178 lb  1.9 oz (80.795 kg)  BMI 24.15 kg/m2  GEN: Well nourished, well developed, in no acute distress HEENT: normal Neck: no JVD, carotid bruits, or masses Cardiac: RRR; no murmurs, rubs, or gallops,no edema  Respiratory:  Decreased BS at R base   normal work of breathing GI: soft, nontender, nondistended, + BS  No hepatomegaly  MS: no deformity Moving all extremities   Skin: warm and dry, no rash Neuro:  Strength and sensation are intact Psych: euthymic mood, full affect   EKG:  EKG is  Not ordered today.   Lipid Panel    Component Value Date/Time   CHOL 180 04/28/2013 0819   TRIG 167* 04/28/2013 0819   HDL 37* 04/28/2013 0819   CHOLHDL 4.9 04/28/2013 0819   LDLCALC 110* 04/28/2013 0819      Wt Readings from Last 3 Encounters:  04/23/14 178 lb 1.9 oz (80.795 kg)  04/20/14 182 lb 9.6 oz (82.827 kg)  03/23/14 177 lb 4.8 oz (80.423 kg)      ASSESSMENT AND PLAN: 1  Arrhythmia   CLinically does not appear to be having recurrences.  Will need to clarify dosing.  CHADSVASc 1  2.  Dizzienss  patinet denies signif spells  Need to clarify midodrine dosing  At last visit i decreased it to 1x epr day  3.  HL  On statin    4.  Oncology  Followed by Drs Steve [name] and Steve [name], Steve Carnes, MD  04/23/2014 12:04 PM    Steve [name], Steve [name], Steve [name]  [id number] Phone: [phone]; Fax: [phone]

## 2014-04-23 NOTE — Telephone Encounter (Signed)
Per staff message and POF I have scheduled appts. Advised scheduler of appts. JMW  

## 2014-04-27 ENCOUNTER — Ambulatory Visit: Payer: Medicare HMO | Admitting: Physician Assistant

## 2014-04-27 ENCOUNTER — Ambulatory Visit (HOSPITAL_BASED_OUTPATIENT_CLINIC_OR_DEPARTMENT_OTHER): Payer: Medicare HMO

## 2014-04-27 ENCOUNTER — Other Ambulatory Visit: Payer: Medicare HMO

## 2014-04-27 ENCOUNTER — Ambulatory Visit: Payer: Medicare HMO

## 2014-04-27 ENCOUNTER — Other Ambulatory Visit (HOSPITAL_BASED_OUTPATIENT_CLINIC_OR_DEPARTMENT_OTHER): Payer: Medicare HMO

## 2014-04-27 VITALS — BP 125/88 | HR 96 | Temp 97.8°F | Resp 15

## 2014-04-27 DIAGNOSIS — C3431 Malignant neoplasm of lower lobe, right bronchus or lung: Secondary | ICD-10-CM

## 2014-04-27 DIAGNOSIS — Z5111 Encounter for antineoplastic chemotherapy: Secondary | ICD-10-CM | POA: Diagnosis not present

## 2014-04-27 DIAGNOSIS — Z95828 Presence of other vascular implants and grafts: Secondary | ICD-10-CM

## 2014-04-27 LAB — COMPREHENSIVE METABOLIC PANEL (CC13)
ALK PHOS: 95 U/L (ref 40–150)
ALT: 54 U/L (ref 0–55)
AST: 41 U/L — AB (ref 5–34)
Albumin: 3.2 g/dL — ABNORMAL LOW (ref 3.5–5.0)
Anion Gap: 12 mEq/L — ABNORMAL HIGH (ref 3–11)
BUN: 12.9 mg/dL (ref 7.0–26.0)
CO2: 23 mEq/L (ref 22–29)
CREATININE: 0.7 mg/dL (ref 0.7–1.3)
Calcium: 9.4 mg/dL (ref 8.4–10.4)
Chloride: 105 mEq/L (ref 98–109)
Glucose: 97 mg/dl (ref 70–140)
Potassium: 4.2 mEq/L (ref 3.5–5.1)
Sodium: 141 mEq/L (ref 136–145)
Total Bilirubin: 0.2 mg/dL (ref 0.20–1.20)
Total Protein: 7.3 g/dL (ref 6.4–8.3)

## 2014-04-27 LAB — CBC WITH DIFFERENTIAL/PLATELET
BASO%: 1.4 % (ref 0.0–2.0)
Basophils Absolute: 0.1 10*3/uL (ref 0.0–0.1)
EOS%: 0.2 % (ref 0.0–7.0)
Eosinophils Absolute: 0 10*3/uL (ref 0.0–0.5)
HEMATOCRIT: 34.1 % — AB (ref 38.4–49.9)
HGB: 11 g/dL — ABNORMAL LOW (ref 13.0–17.1)
LYMPH#: 1.2 10*3/uL (ref 0.9–3.3)
LYMPH%: 27.9 % (ref 14.0–49.0)
MCH: 27.7 pg (ref 27.2–33.4)
MCHC: 32.3 g/dL (ref 32.0–36.0)
MCV: 85.9 fL (ref 79.3–98.0)
MONO#: 1.4 10*3/uL — ABNORMAL HIGH (ref 0.1–0.9)
MONO%: 31.9 % — ABNORMAL HIGH (ref 0.0–14.0)
NEUT#: 1.7 10*3/uL (ref 1.5–6.5)
NEUT%: 38.6 % — AB (ref 39.0–75.0)
Platelets: 502 10*3/uL — ABNORMAL HIGH (ref 140–400)
RBC: 3.97 10*6/uL — AB (ref 4.20–5.82)
RDW: 17.9 % — ABNORMAL HIGH (ref 11.0–14.6)
WBC: 4.3 10*3/uL (ref 4.0–10.3)

## 2014-04-27 LAB — TECHNOLOGIST REVIEW

## 2014-04-27 MED ORDER — PROCHLORPERAZINE MALEATE 10 MG PO TABS
ORAL_TABLET | ORAL | Status: AC
  Filled 2014-04-27: qty 1

## 2014-04-27 MED ORDER — SODIUM CHLORIDE 0.9 % IJ SOLN
10.0000 mL | INTRAMUSCULAR | Status: DC | PRN
  Administered 2014-04-27: 10 mL via INTRAVENOUS
  Filled 2014-04-27: qty 10

## 2014-04-27 MED ORDER — HEPARIN SOD (PORK) LOCK FLUSH 100 UNIT/ML IV SOLN
500.0000 [IU] | Freq: Once | INTRAVENOUS | Status: AC | PRN
  Administered 2014-04-27: 500 [IU]
  Filled 2014-04-27: qty 5

## 2014-04-27 MED ORDER — PROCHLORPERAZINE MALEATE 10 MG PO TABS
10.0000 mg | ORAL_TABLET | Freq: Once | ORAL | Status: AC
  Administered 2014-04-27: 10 mg via ORAL

## 2014-04-27 MED ORDER — SODIUM CHLORIDE 0.9 % IJ SOLN
10.0000 mL | INTRAMUSCULAR | Status: DC | PRN
  Administered 2014-04-27: 10 mL
  Filled 2014-04-27: qty 10

## 2014-04-27 MED ORDER — SODIUM CHLORIDE 0.9 % IV SOLN
Freq: Once | INTRAVENOUS | Status: AC
  Administered 2014-04-27: 15:00:00 via INTRAVENOUS

## 2014-04-27 MED ORDER — SODIUM CHLORIDE 0.9 % IV SOLN
2000.0000 mg | Freq: Once | INTRAVENOUS | Status: AC
  Administered 2014-04-27: 2000 mg via INTRAVENOUS
  Filled 2014-04-27: qty 52.6

## 2014-04-27 NOTE — Patient Instructions (Signed)

## 2014-04-27 NOTE — Patient Instructions (Signed)
[facility] Discharge Instructions for Patients Receiving Chemotherapy  Today you received the following chemotherapy agents [name]  To help prevent nausea and vomiting after your treatment, we encourage you to take your nausea medication as prescribed   If you develop nausea and vomiting that is not controlled by your nausea medication, call the clinic.   BELOW ARE SYMPTOMS THAT SHOULD BE REPORTED IMMEDIATELY:  *FEVER GREATER THAN 100.5 F  *CHILLS WITH OR WITHOUT FEVER  NAUSEA AND VOMITING THAT IS NOT CONTROLLED WITH YOUR NAUSEA MEDICATION  *UNUSUAL SHORTNESS OF BREATH  *UNUSUAL BRUISING OR BLEEDING  TENDERNESS IN MOUTH AND THROAT WITH OR WITHOUT PRESENCE OF ULCERS  *URINARY PROBLEMS  *BOWEL PROBLEMS  UNUSUAL RASH Items with * indicate a potential emergency and should be followed up as soon as possible.  Feel free to call the clinic you have any questions or concerns. The clinic phone number is [phone].  Please show the CHEMO ALERT CARD at check-in to the Emergency Department and triage nurse.   

## 2014-04-29 ENCOUNTER — Telehealth: Payer: Self-pay | Admitting: *Deleted

## 2014-04-29 NOTE — Telephone Encounter (Signed)
Pt called with complaints of indigestion related pain on left side of chest. Pt states " it's not a heart attack, I've had it for 8-9days and I've seen the heart doctor. Is this a side effect fromt eh [name]."  Pt currently taking prilosec '20mg'$  daily.  Reviewed with PA, instructed pt to take two '20mg'$  prilosec tablets 31mns-1 hr prior to a meal, this is not a usual side effect from the [name]. Pt to call office with additional concerns or questions. Pt verbalized understanding no further concerns at this time

## 2014-05-03 ENCOUNTER — Telehealth: Payer: Self-pay | Admitting: Medical Oncology

## 2014-05-03 ENCOUNTER — Encounter: Payer: Self-pay | Admitting: Internal Medicine

## 2014-05-03 DIAGNOSIS — K30 Functional dyspepsia: Secondary | ICD-10-CM

## 2014-05-03 MED ORDER — SUCRALFATE 1 GM/10ML PO SUSP: 1.0000 g | Freq: Three times a day (TID) | ORAL | Status: DC

## 2014-05-03 NOTE — Telephone Encounter (Signed)
Unbearable indigestion , not at night just during the day. Does not interfere with eating or swallowing. He takes prilosec 2 tablets and just took antacid that seemed to help. Steve [name] prescribed carafate and rx sent to his local pharmacy.I also instructed pt to call his PCP .

## 2014-05-04 ENCOUNTER — Other Ambulatory Visit: Payer: Self-pay | Admitting: *Deleted

## 2014-05-04 DIAGNOSIS — G4700 Insomnia, unspecified: Secondary | ICD-10-CM

## 2014-05-04 MED ORDER — TEMAZEPAM 30 MG PO CAPS: 30.0000 mg | ORAL_CAPSULE | Freq: Every evening | ORAL | Status: DC | PRN

## 2014-05-04 NOTE — Telephone Encounter (Signed)
Refill: Restoril. Patient notified and verbalized understanding.

## 2014-05-10 ENCOUNTER — Encounter: Payer: Self-pay | Admitting: Nurse Practitioner

## 2014-05-10 ENCOUNTER — Ambulatory Visit (INDEPENDENT_AMBULATORY_CARE_PROVIDER_SITE_OTHER): Payer: Medicare HMO | Admitting: Nurse Practitioner

## 2014-05-10 VITALS — BP 120/72 | HR 116 | Temp 97.6°F | Resp 16 | Ht 72.0 in | Wt 179.2 lb

## 2014-05-10 DIAGNOSIS — R079 Chest pain, unspecified: Secondary | ICD-10-CM | POA: Diagnosis not present

## 2014-05-10 DIAGNOSIS — R000 Tachycardia, unspecified: Secondary | ICD-10-CM | POA: Diagnosis not present

## 2014-05-10 DIAGNOSIS — G629 Polyneuropathy, unspecified: Secondary | ICD-10-CM

## 2014-05-10 DIAGNOSIS — E1142 Type 2 diabetes mellitus with diabetic polyneuropathy: Secondary | ICD-10-CM

## 2014-05-10 MED ORDER — OMEPRAZOLE 40 MG PO CPDR: 40.0000 mg | DELAYED_RELEASE_CAPSULE | Freq: Every day | ORAL | Status: DC

## 2014-05-10 MED ORDER — SUCRALFATE 1 GM/10ML PO SUSP: 1.0000 g | Freq: Three times a day (TID) | ORAL | Status: DC

## 2014-05-10 NOTE — Patient Instructions (Signed)
To take carafate and prilosec as prescribed  Will get chest xray

## 2014-05-10 NOTE — Progress Notes (Signed)
Patient ID: Steve [name], male   DOB: 01/01/1941, 74 y.o.   MRN: [id number]    PCP: Gildardo Cranker, DO  Allergies  Allergen Reactions  . Morphine And Related Hives    Chief Complaint  Patient presents with  . Acute Visit    Discuss changing acid reflux medication. Patient with left side chest pain off/on x several weeks. Patient was started on prilosec by oncologist, not helping      HPI: Patient is a 74 y.o. male seen in the office today due to pains in his chest. Pt with hx of lung cancer, chronic cough and shortness of breath.  30 days ago he noted a pain under his left breast that radiates to neck. Pain of 9/10. As the day progresses it gets worse. Gets relief when he is laying down as soon as he stands up it gets bad again. Not on pain medications did not like the side effects.   Pt called oncologist and stated he felt like he was having GERD and the GERD was from [name] (noted not to be a usual side effect) was started on omeprazole but has not seen improvement.  Unable to touch the pain Improved when he eats.  No changes in bowel, no dark tarry stools.  Being followed by cardiology due to a fib and syncopal episodes. At last cardiology visit there was questions in regards to his medication, he still does not know what he is taking.  Stopped aleve 1 week ago, questioned if this was causing his pain. Was taking 4 tablets daily for 1 week, but would take it routinely prior to that Review of Systems:  Review of Systems  Constitutional: Negative for fever and chills.  HENT: Negative for congestion and sore throat.   Eyes:       Tenderness between the eyes and sometimes in the eyes.  Respiratory: Positive for cough, shortness of breath and wheezing.        History of lung cancer  Cardiovascular: Negative for chest pain and leg swelling.  Gastrointestinal: Positive for constipation. Negative for abdominal pain, diarrhea, blood in stool, abdominal distention and anal bleeding.    Endocrine:       History of elevated blood sugars  Genitourinary: Negative.   Musculoskeletal: Positive for myalgias.  Allergic/Immunologic: Negative.   Neurological: Positive for headaches.  Psychiatric/Behavioral: Negative.     Past Medical History  Diagnosis Date  . Dizziness and giddiness     positional vertigo  . Hyperlipidemia   . Impotence of organic origin   . Osteoarthritis, shoulder   . Allergy   . Hypothyroidism     pt denies thryoid disease, dr pandey note 11-19-12 says subclinical hypothryoid in epic  . COPD (chronic obstructive pulmonary disease)   . Insomnia   . Hx of radiation therapy 02/2013    right lung  . Diabetes mellitus without complication     pt denies diabetes, dr pandey note says dm 11-19-12 epic  . 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 Malignant neoplasm of bladder, part unspecified   . Malignant neoplasm of bronchus and lung, unspecified site    Past Surgical History  Procedure Laterality Date  . Cystoscopy w/ dilation of bladder    . Tonsillectomy  age 56  . Endobronchial ultrasound Bilateral 12/15/2012    Procedure: ENDOBRONCHIAL ULTRASOUND;  Surgeon: Brand Males, MD;  Location: [facility];  Service: Cardiopulmonary;  Laterality: Bilateral;  . Colonoscopy  2009  [facility]   Social History:   reports that he quit smoking about 7 years ago. His smoking use included Cigarettes. He has a 75 pack-year smoking history. His smokeless tobacco use includes Chew. He reports that he does not drink alcohol or use illicit drugs.  Family History  Problem Relation Age of Onset  . Kidney disease Brother     Medications: Patient's Medications  New Prescriptions   No medications on file  Previous Medications   ASPIRIN 81 MG TABLET    Take 81 mg by mouth daily.   FLECAINIDE (TAMBOCOR) 50 MG TABLET    Take 50 mg by mouth.   GABAPENTIN (NEURONTIN) 300 MG CAPSULE    TAKE 1 CAPSULE BY MOUTH THREE TIMES DAILY   METOPROLOL  TARTRATE (LOPRESSOR) 25 MG TABLET    Take 1 tablet (25 mg total) by mouth 2 (two) times daily.   NAPROXEN SODIUM (ANAPROX) 220 MG TABLET    Take 220 mg by mouth 2 (two) times daily with a meal.   SIMVASTATIN (ZOCOR) 20 MG TABLET    Take one tablet by mouth once daily to lower cholesterol   TEMAZEPAM (RESTORIL) 30 MG CAPSULE    Take 1 capsule (30 mg total) by mouth at bedtime as needed for sleep.  Modified Medications   No medications on file  Discontinued Medications   OXYCODONE-ACETAMINOPHEN (ROXICET) 5-325 MG PER TABLET    Take 1 tablet by mouth every 6 (six) hours as needed for severe pain.   SUCRALFATE (CARAFATE) 1 GM/10ML SUSPENSION    Take 10 mLs (1 g total) by mouth 4 (four) times daily -  with meals and at bedtime.     Physical Exam:  Filed Vitals:   05/10/14 1550  BP: 120/72  Pulse: 116  Temp: 97.6 F (36.4 C)  TempSrc: Oral  Resp: 16  Height: 6' (1.829 m)  Weight: 179 lb 3.2 oz (81.285 kg)  SpO2: 93%    Physical Exam  Constitutional: He is oriented to person, place, and time. He appears well-developed and well-nourished. No distress.  Eyes: Conjunctivae and EOM are normal. Pupils are equal, round, and reactive to light.  Neck: Normal range of motion. Neck supple.  Cardiovascular: Normal rate, regular rhythm and normal heart sounds.   Pulmonary/Chest: Effort normal. He has decreased breath sounds.  Abdominal: Soft. Bowel sounds are normal. He exhibits no distension. There is no tenderness. There is no guarding.  Neurological: He is alert and oriented to person, place, and time.  Skin: Skin is warm and dry. He is not diaphoretic.  Psychiatric: He has a normal mood and affect.    Labs reviewed: Basic Metabolic Panel:  Recent Labs  11/16/13 0755  02/09/14 1021  03/09/14 1244  04/06/14 0819 04/20/14 0937 04/27/14 1353  NA 139  < > 140  < >  --   < > 141 144 141  K 4.1  < > 4.7  < >  --   < > 4.2 4.3 4.2  CL 100  --   --   --   --   --   --   --   --   CO2 23   < > 26  < >  --   < > '24 28 23  '$ GLUCOSE 101*  < > 135  < >  --   < > 144* 138 97  BUN 12  < > 22.0  < >  --   < > 20.3 15.1 12.9  CREATININE  0.73  < > 1.1  < >  --   < > 0.8 0.8 0.7  CALCIUM 10.0  < > 9.5  < >  --   < > 9.7 9.4 9.4  TSH  --   < > 2.136  --  2.092  --   --  2.666  --   < > = values in this interval not displayed. Liver Function Tests:  Recent Labs  04/06/14 0819 04/20/14 0937 04/27/14 1353  AST 26 34 41*  ALT 34 50 54  ALKPHOS 82 107 95  BILITOT 0.29 0.27 0.20  PROT 7.4 7.5 7.3  ALBUMIN 3.2* 3.3* 3.2*    Recent Labs  01/28/14 1725  LIPASE 59.0   No results for input(s): AMMONIA in the last 8760 hours. CBC:  Recent Labs  04/06/14 0819 04/20/14 0937 04/27/14 1353  WBC 4.8 8.3 4.3  NEUTROABS 3.5 6.5 1.7  HGB 11.6* 11.6* 11.0*  HCT 36.6* 36.2* 34.1*  MCV 82.9 83.8 85.9  PLT 286 466* 502*   Lipid Panel: No results for input(s): CHOL, HDL, LDLCALC, TRIG, CHOLHDL, LDLDIRECT in the last 8760 hours. TSH:  Recent Labs  02/09/14 1021 03/09/14 1244 04/20/14 0937  TSH 2.136 2.092 2.666   A1C: Lab Results  Component Value Date   HGBA1C 6.1* 04/28/2013     Assessment/Plan  1. Tachycardia -pt with hx of SVT and bradycardia, following with cardiology, in the setting of left sided chest pain EKG preformed which showed tachycardia - EKG 12-Lead  2. Left sided chest pain - EKG 12-Lead with out acute finding, chest pains have been on and off for over a month -had been taking aleve routinely and had increased medication right around the time chest pain started, thought it was due to GERD but sounds like this could be related to peptic ulcer- sucralfate (CARAFATE) 1 GM/10ML suspension; Take 10 mLs (1 g total) by mouth 4 (four) times daily -  with meals and at bedtime.  Dispense: 420 mL; Refill: 0 was previously ordered but pt never got filled, will send new RX to pharmacy and educated to patient on need to take medication - pt to also cont omeprazole  (PRILOSEC) 40 MG capsule; Take 1 capsule (40 mg total) by mouth daily.  Dispense: 30 capsule; Refill: 4 - will get a DG Chest 2 View due to malignant lung Ca which also could be causing pain - CBC with Differential/Platelet   3. DM type 2 with diabetic peripheral neuropathy -not currently on medications, will follow up Hemoglobin A1c   To follow up in 1 month with Dr Nyoka Cowden, sooner if needed

## 2014-05-11 ENCOUNTER — Ambulatory Visit (HOSPITAL_BASED_OUTPATIENT_CLINIC_OR_DEPARTMENT_OTHER): Payer: Medicare HMO | Admitting: Internal Medicine

## 2014-05-11 ENCOUNTER — Ambulatory Visit: Payer: Medicare HMO

## 2014-05-11 ENCOUNTER — Telehealth: Payer: Self-pay | Admitting: Internal Medicine

## 2014-05-11 ENCOUNTER — Other Ambulatory Visit (HOSPITAL_BASED_OUTPATIENT_CLINIC_OR_DEPARTMENT_OTHER): Payer: Medicare HMO

## 2014-05-11 ENCOUNTER — Encounter: Payer: Self-pay | Admitting: Internal Medicine

## 2014-05-11 ENCOUNTER — Ambulatory Visit (HOSPITAL_BASED_OUTPATIENT_CLINIC_OR_DEPARTMENT_OTHER): Payer: Medicare HMO

## 2014-05-11 VITALS — BP 122/69 | HR 96 | Temp 97.5°F | Resp 20 | Ht 72.0 in | Wt 177.8 lb

## 2014-05-11 DIAGNOSIS — C3431 Malignant neoplasm of lower lobe, right bronchus or lung: Secondary | ICD-10-CM

## 2014-05-11 DIAGNOSIS — R1319 Other dysphagia: Secondary | ICD-10-CM | POA: Diagnosis not present

## 2014-05-11 DIAGNOSIS — Z5111 Encounter for antineoplastic chemotherapy: Secondary | ICD-10-CM | POA: Diagnosis not present

## 2014-05-11 DIAGNOSIS — K219 Gastro-esophageal reflux disease without esophagitis: Secondary | ICD-10-CM | POA: Diagnosis not present

## 2014-05-11 DIAGNOSIS — R072 Precordial pain: Secondary | ICD-10-CM | POA: Diagnosis not present

## 2014-05-11 DIAGNOSIS — Z95828 Presence of other vascular implants and grafts: Secondary | ICD-10-CM

## 2014-05-11 LAB — HEMOGLOBIN A1C
ESTIMATED AVERAGE GLUCOSE: 140 mg/dL
HEMOGLOBIN A1C: 6.5 % — AB (ref 4.8–5.6)

## 2014-05-11 LAB — CBC WITH DIFFERENTIAL/PLATELET
BASO%: 0.1 % (ref 0.0–2.0)
BASOS ABS: 0 10*3/uL (ref 0.0–0.2)
BASOS: 0 %
Basophils Absolute: 0 10*3/uL (ref 0.0–0.1)
EOS (ABSOLUTE): 0.1 10*3/uL (ref 0.0–0.4)
EOS ABS: 0.1 10*3/uL (ref 0.0–0.5)
EOS%: 0.7 % (ref 0.0–7.0)
Eos: 1 %
HCT: 35.5 % — ABNORMAL LOW (ref 38.4–49.9)
HGB: 11.5 g/dL — ABNORMAL LOW (ref 13.0–17.1)
Hematocrit: 36.5 % — ABNORMAL LOW (ref 37.5–51.0)
Hemoglobin: 11.9 g/dL — ABNORMAL LOW (ref 12.6–17.7)
Immature Grans (Abs): 0 10*3/uL (ref 0.0–0.1)
Immature Granulocytes: 0 %
LYMPH%: 5 % — AB (ref 14.0–49.0)
LYMPHS ABS: 1 10*3/uL (ref 0.7–3.1)
Lymphs: 8 %
MCH: 28.2 pg (ref 26.6–33.0)
MCH: 28.6 pg (ref 27.2–33.4)
MCHC: 32.6 g/dL (ref 31.5–35.7)
MCHC: 32.4 g/dL (ref 32.0–36.0)
MCV: 87 fL (ref 79–97)
MCV: 88.3 fL (ref 79.3–98.0)
MONO#: 1.1 10*3/uL — AB (ref 0.1–0.9)
MONO%: 8.4 % (ref 0.0–14.0)
MONOCYTES: 13 %
Monocytes Absolute: 1.7 10*3/uL — ABNORMAL HIGH (ref 0.1–0.9)
NEUT%: 85.8 % — ABNORMAL HIGH (ref 39.0–75.0)
NEUTROS ABS: 9.9 10*3/uL — AB (ref 1.4–7.0)
NEUTROS ABS: 11.7 10*3/uL — AB (ref 1.5–6.5)
Neutrophils: 78 %
Platelets: 484 10*3/uL — ABNORMAL HIGH (ref 150–379)
Platelets: 450 10*3/uL — ABNORMAL HIGH (ref 140–400)
RBC: 4.22 x10E6/uL (ref 4.14–5.80)
RBC: 4.02 10*6/uL — AB (ref 4.20–5.82)
RDW: 21 % — AB (ref 12.3–15.4)
RDW: 20.5 % — AB (ref 11.0–14.6)
WBC: 12.7 10*3/uL — AB (ref 3.4–10.8)
WBC: 13.7 10*3/uL — AB (ref 4.0–10.3)
lymph#: 0.7 10*3/uL — ABNORMAL LOW (ref 0.9–3.3)

## 2014-05-11 LAB — COMPREHENSIVE METABOLIC PANEL (CC13)
ALT: 29 U/L (ref 0–55)
ANION GAP: 10 meq/L (ref 3–11)
AST: 18 U/L (ref 5–34)
Albumin: 3.2 g/dL — ABNORMAL LOW (ref 3.5–5.0)
Alkaline Phosphatase: 102 U/L (ref 40–150)
BILIRUBIN TOTAL: 0.35 mg/dL (ref 0.20–1.20)
BUN: 18.5 mg/dL (ref 7.0–26.0)
CALCIUM: 9.5 mg/dL (ref 8.4–10.4)
CHLORIDE: 102 meq/L (ref 98–109)
CO2: 26 meq/L (ref 22–29)
CREATININE: 0.8 mg/dL (ref 0.7–1.3)
EGFR: 90 mL/min/{1.73_m2} — ABNORMAL LOW (ref >90)
Glucose: 192 mg/dl — ABNORMAL HIGH (ref 70–140)
Potassium: 4.1 mEq/L (ref 3.5–5.1)
Sodium: 138 mEq/L (ref 136–145)
Total Protein: 7.3 g/dL (ref 6.4–8.3)

## 2014-05-11 MED ORDER — PROCHLORPERAZINE MALEATE 10 MG PO TABS
10.0000 mg | ORAL_TABLET | Freq: Once | ORAL | Status: AC
  Administered 2014-05-11: 10 mg via ORAL

## 2014-05-11 MED ORDER — SODIUM CHLORIDE 0.9 % IJ SOLN
10.0000 mL | INTRAMUSCULAR | Status: DC | PRN
  Administered 2014-05-11: 10 mL
  Filled 2014-05-11: qty 10

## 2014-05-11 MED ORDER — HEPARIN SOD (PORK) LOCK FLUSH 100 UNIT/ML IV SOLN
500.0000 [IU] | Freq: Once | INTRAVENOUS | Status: AC | PRN
  Administered 2014-05-11: 500 [IU]
  Filled 2014-05-11: qty 5

## 2014-05-11 MED ORDER — SODIUM CHLORIDE 0.9 % IV SOLN
Freq: Once | INTRAVENOUS | Status: AC
  Administered 2014-05-11: 12:00:00 via INTRAVENOUS

## 2014-05-11 MED ORDER — SODIUM CHLORIDE 0.9 % IJ SOLN
10.0000 mL | INTRAMUSCULAR | Status: DC | PRN
Start: 2014-05-11 — End: 2014-05-11
  Administered 2014-05-11: 10 mL via INTRAVENOUS
  Filled 2014-05-11: qty 10

## 2014-05-11 MED ORDER — PROCHLORPERAZINE MALEATE 10 MG PO TABS
ORAL_TABLET | ORAL | Status: AC
  Filled 2014-05-11: qty 1

## 2014-05-11 MED ORDER — SODIUM CHLORIDE 0.9 % IV SOLN
2000.0000 mg | Freq: Once | INTRAVENOUS | Status: AC
  Administered 2014-05-11: 2000 mg via INTRAVENOUS
  Filled 2014-05-11: qty 52.6

## 2014-05-11 NOTE — Patient Instructions (Signed)
[facility] Discharge Instructions for Patients Receiving Chemotherapy  Today you received the following chemotherapy agents [name]  To help prevent nausea and vomiting after your treatment, we encourage you to take your nausea medication as prescribed   If you develop nausea and vomiting that is not controlled by your nausea medication, call the clinic.   BELOW ARE SYMPTOMS THAT SHOULD BE REPORTED IMMEDIATELY:  *FEVER GREATER THAN 100.5 F  *CHILLS WITH OR WITHOUT FEVER  NAUSEA AND VOMITING THAT IS NOT CONTROLLED WITH YOUR NAUSEA MEDICATION  *UNUSUAL SHORTNESS OF BREATH  *UNUSUAL BRUISING OR BLEEDING  TENDERNESS IN MOUTH AND THROAT WITH OR WITHOUT PRESENCE OF ULCERS  *URINARY PROBLEMS  *BOWEL PROBLEMS  UNUSUAL RASH Items with * indicate a potential emergency and should be followed up as soon as possible.  Feel free to call the clinic you have any questions or concerns. The clinic phone number is [phone].  Please show the CHEMO ALERT CARD at check-in to the Emergency Department and triage nurse.   

## 2014-05-11 NOTE — Progress Notes (Signed)
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   Primary site: Lung (Right)  Staging method: AJCC 7th Edition  Clinical: Stage IIIA (T2b, N2, M0)  Summary: Stage IIIA (T2b, N2, M0)   PRIOR THERAPY:  1) Concurrent chemoradiation with weekly carboplatin for an AUC of 2 and paclitaxel 45 mg/m2, status post 4 cycles. 2) Consolidation chemotherapy with carboplatin for AUC of 5 and paclitaxel 175 mg/M2 every 3 weeks with Neulasta support, first cycle on 03/23/2013. Status post 3 cycles. 3) Immunotherapy with Nivolumab 3 mg/KG every 2 weeks. First dose 09/21/2013. Status post 12 cycles.   CURRENT THERAPY: Systemic chemotherapy with gemcitabine 1000 MG/M2 on days 1 and 8 every 3 weeks. First dose 03/30/2014. Status post 2 cycles.  DISEASE STAGE:  Lung cancer  Primary site: Lung (Right)  Staging method: AJCC 7th Edition  Clinical: Stage IIIA (T2b, N2, M0)  Summary: Stage IIIA (T2b, N2, M0)  CHEMOTHERAPY INTENT: Control/Palliative  CURRENT # OF CHEMOTHERAPY CYCLES: 3 CURRENT ANTIEMETICS: Zofran, dexamethasone, Compazine  CURRENT SMOKING STATUS: Former smoker, quit 01/09/2007  ORAL CHEMOTHERAPY AND CONSENT: n/a  CURRENT BISPHOSPHONATES USE: none  PAIN MANAGEMENT: Ibuprofen  NARCOTICS INDUCED CONSTIPATION: None  LIVING WILL AND CODE STATUS: ?  INTERVAL HISTORY: Steve [name] 74 y.o. male returns to the clinic today for followup visit accompanied by his wife. He is currently on treatment with single agent gemcitabine status post 2 cycles and tolerating his treatment fairly well. The patient continues to complain of pain in the central part of his chest which  improves after eating. He also has a lot of bloating. He was seen by his primary care physician yesterday and started on Prilosec as well as Carafate. He continues to have shortness of breath with exertion with mild cough without hemoptysis. He denied having any weight loss or night sweats. The patient has no fever or chills and no nausea or vomiting. He is here today to start cycle #3 of his chemotherapy.  MEDICAL HISTORY: Past Medical History  Diagnosis Date  . Dizziness and giddiness     positional vertigo  . Hyperlipidemia   . Impotence of organic origin   . Osteoarthritis, shoulder   . Allergy   . Hypothyroidism     pt denies thryoid disease, dr pandey note 11-19-12 says subclinical hypothryoid in epic  . COPD (chronic obstructive pulmonary disease)   . Insomnia   . Hx of radiation therapy 02/2013    right lung  . Diabetes mellitus without complication     pt denies diabetes, dr pandey note says dm 11-19-12 epic  . 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 Malignant neoplasm of bladder, part unspecified   . Malignant neoplasm of bronchus and lung, unspecified site     ALLERGIES:  is allergic to morphine and related.  MEDICATIONS:  Current Outpatient Prescriptions  Medication Sig Dispense Refill  . aspirin 81 MG tablet Take 81 mg by mouth daily.    . DULoxetine (CYMBALTA) 60 MG capsule     . flecainide (TAMBOCOR) 50 MG tablet Take 50 mg by mouth.  6  . gabapentin (NEURONTIN) 300 MG capsule TAKE 1 CAPSULE BY MOUTH THREE TIMES  DAILY 270 capsule 0  . metoprolol tartrate (LOPRESSOR) 25 MG tablet Take 1 tablet (25 mg total) by mouth 2 (two) times daily. 90 tablet 1  . midodrine (PROAMATINE) 2.5 MG tablet     . naproxen sodium (ANAPROX) 220 MG tablet Take 220 mg by mouth 2 (two) times daily with a meal.    . omeprazole (PRILOSEC) 40 MG capsule Take 1 capsule (40 mg total) by mouth daily. 30 capsule 4  . simvastatin (ZOCOR) 20 MG tablet Take one tablet by mouth once  daily to lower cholesterol 90 tablet 3  . sucralfate (CARAFATE) 1 GM/10ML suspension Take 10 mLs (1 g total) by mouth 4 (four) times daily -  with meals and at bedtime. 420 mL 0  . temazepam (RESTORIL) 30 MG capsule Take 1 capsule (30 mg total) by mouth at bedtime as needed for sleep. 30 capsule 0  . oxyCODONE-acetaminophen (PERCOCET/ROXICET) 5-325 MG per tablet      No current facility-administered medications for this visit.    SURGICAL HISTORY:  Past Surgical History  Procedure Laterality Date  . Cystoscopy w/ dilation of bladder    . Tonsillectomy  age 53  . Endobronchial ultrasound Bilateral 12/15/2012    Procedure: ENDOBRONCHIAL ULTRASOUND;  Surgeon: Brand Males, MD;  Location: [facility];  Service: Cardiopulmonary;  Laterality: Bilateral;  . Colonoscopy  2009    [facility]    REVIEW OF SYSTEMS:  A comprehensive review of systems was negative except for: Constitutional: positive for anorexia and fatigue Respiratory: positive for dyspnea on exertion and pleurisy/chest pain Gastrointestinal: positive for dyspepsia   PHYSICAL EXAMINATION: General appearance: alert, cooperative and no distress Head: Normocephalic, without obvious abnormality, atraumatic Neck: no adenopathy, no JVD, supple, symmetrical, trachea midline and thyroid not enlarged, symmetric, no tenderness/mass/nodules Lymph nodes: Cervical, supraclavicular, and axillary nodes normal. Resp: clear to auscultation bilaterally Back: symmetric, no curvature. ROM normal. No CVA tenderness. Cardio: regular rate and rhythm, S1, S2 normal, no murmur, click, rub or gallop GI: soft, non-tender; bowel sounds normal; no masses,  no organomegaly Extremities: extremities normal, atraumatic, no cyanosis or edema Neurologic: Alert and oriented X 3, normal strength and tone. Normal symmetric reflexes. Normal coordination and gait  ECOG PERFORMANCE STATUS: 1 - Symptomatic but completely ambulatory  Blood pressure 122/69, pulse  96, temperature 97.5 F (36.4 C), temperature source Oral, resp. rate 20, height 6' (1.829 m), weight 177 lb 12.8 oz (80.65 kg), SpO2 100 %.  LABORATORY DATA: Lab Results  Component Value Date   WBC 13.7* 05/11/2014   HGB 11.5* 05/11/2014   HCT 35.5* 05/11/2014   MCV 88.3 05/11/2014   PLT 450* 05/11/2014      Chemistry      Component Value Date/Time   NA 138 05/11/2014 1059   NA 139 11/16/2013 0755   NA 142 04/28/2013 0819   K 4.1 05/11/2014 1059   K 4.1 11/16/2013 0755   CL 100 11/16/2013 0755   CO2 26 05/11/2014 1059   CO2 23 11/16/2013 0755   BUN 18.5 05/11/2014 1059   BUN 12 11/16/2013 0755   BUN 12 04/28/2013 0819   CREATININE 0.8 05/11/2014 1059   CREATININE 0.73 11/16/2013 0755      Component Value Date/Time   CALCIUM 9.5 05/11/2014 1059   CALCIUM 10.0 11/16/2013 0755   ALKPHOS 102 05/11/2014 1059   ALKPHOS 107 04/28/2013 0819   AST 18 05/11/2014 1059   AST 19 04/28/2013 0819   ALT 29 05/11/2014 1059   ALT 12 04/28/2013  3559   BILITOT 0.35 05/11/2014 1059   BILITOT 0.4 04/28/2013 0819       RADIOGRAPHIC STUDIES: No results found. ASSESSMENT AND PLAN: This is a very pleasant 74 years old [place] male with:   1) metastatic non-small cell lung cancer initially diagnosed as stage IIIA non-small cell lung cancer who completed a course of concurrent chemoradiation with weekly carboplatin and paclitaxel with no significant complaints except for radiation induced odynophagia.  He also completed 3 cycles of consolidation chemotherapy with reduced dose carboplatin and paclitaxel. He completed 12 cycles of immunotherapy with Nivolumab and tolerated his treatment fairly well. Unfortunately the recent CT scan of the chest showed further increase in the size of the subcarinal nodal mass consistent with disease progression. The patient was started on systemic chemotherapy with single agent gemcitabine 1000 MG/M2 on days 1 and 8 every 3 weeks. He is status post 2 cycles. The  patient is tolerating his chemotherapy fairly well with no significant adverse effects. I recommended for the patient to proceed with cycle #3 today as a scheduled. He would come back for follow-up visit in 3 weeks for reevaluation after repeating CT scan of the chest, abdomen and pelvis for restaging of his disease. For the acid reflux and retrosternal pain, the patient will continue on Prilosec, Carafate as well as pain medication with oxycodone. He was advised to call immediately if he has any concerning symptoms in the interval.  The patient voices understanding of current disease status and treatment options and is in agreement with the current care plan.  All questions were answered. The patient knows to call the clinic with any problems, questions or concerns. We can certainly see the patient much sooner if necessary.  Disclaimer: This note was dictated with voice recognition software. Similar sounding words can inadvertently be transcribed and may not be corrected upon review.

## 2014-05-11 NOTE — Patient Instructions (Signed)

## 2014-05-11 NOTE — Telephone Encounter (Signed)
Gave avs & calendar for May. Sent message to schedule treatment. °

## 2014-05-11 NOTE — Telephone Encounter (Signed)
Chemo scheduled per staff message

## 2014-05-12 ENCOUNTER — Telehealth: Payer: Self-pay

## 2014-05-12 ENCOUNTER — Other Ambulatory Visit: Payer: Self-pay

## 2014-05-12 DIAGNOSIS — C3490 Malignant neoplasm of unspecified part of unspecified bronchus or lung: Secondary | ICD-10-CM

## 2014-05-12 DIAGNOSIS — R079 Chest pain, unspecified: Secondary | ICD-10-CM

## 2014-05-12 NOTE — Telephone Encounter (Signed)
Steve [name] called requesting that we place order for CT Angiogram, since patient refused Chest Xray. Steve [name] spoke with patients oncologist about this order request. If patient will agree to CT Angiogram, oncology will cancel order for CT whole body.   Dorothy (referral coordinator) call and got CT angiogram approved.   Spoke with patient, patient in agreement to get CT Angiogram. Patient aware [facility] will contact him with appointment date and time.

## 2014-05-13 ENCOUNTER — Telehealth: Payer: Self-pay | Admitting: *Deleted

## 2014-05-13 NOTE — Telephone Encounter (Signed)
PT.'S CT IS AT [facility]. PT. WAS GIVEN [facility] PHONE NUMBER.

## 2014-05-14 ENCOUNTER — Other Ambulatory Visit: Payer: Medicare HMO

## 2014-05-14 ENCOUNTER — Telehealth: Payer: Self-pay | Admitting: *Deleted

## 2014-05-14 ENCOUNTER — Ambulatory Visit
Admission: RE | Admit: 2014-05-14 | Discharge: 2014-05-14 | Disposition: A | Discharge status: Home / Self Care | Payer: Medicare HMO | Source: Ambulatory Visit | Attending: Nurse Practitioner | Admitting: Nurse Practitioner

## 2014-05-14 DIAGNOSIS — R079 Chest pain, unspecified: Secondary | ICD-10-CM

## 2014-05-14 DIAGNOSIS — C3490 Malignant neoplasm of unspecified part of unspecified bronchus or lung: Secondary | ICD-10-CM

## 2014-05-14 MED ORDER — IOPAMIDOL (ISOVUE-370) INJECTION 76%
80.0000 mL | Freq: Once | INTRAVENOUS | Status: AC | PRN
  Administered 2014-05-14: 80 mL via INTRAVENOUS

## 2014-05-14 NOTE — Telephone Encounter (Signed)
TC from patient. He is on his way for CT scan and would like to have a report of the results today if possible. Both Dr. Julien Nordmann and Awilda Metro, PA are out of the office today. Pt saw his MD @ [facility] and then will call that MD to see if he can get results today.

## 2014-05-17 ENCOUNTER — Telehealth: Payer: Self-pay | Admitting: *Deleted

## 2014-05-17 NOTE — Telephone Encounter (Signed)
No evidence for disease progression. Stable condition. No blood clots in the lung

## 2014-05-17 NOTE — Telephone Encounter (Signed)
Per MD notified pt no evidence for disease progression. Stable condition. No blood clots in the lung. Pt verbalized understanding no further concerns.

## 2014-05-17 NOTE — Telephone Encounter (Signed)
VM message from patient regarding results of CT angiogram from 05/14/14. Please call patient with results. @ [phone]

## 2014-05-18 ENCOUNTER — Other Ambulatory Visit (HOSPITAL_BASED_OUTPATIENT_CLINIC_OR_DEPARTMENT_OTHER): Payer: Medicare HMO

## 2014-05-18 ENCOUNTER — Ambulatory Visit: Payer: Medicare HMO

## 2014-05-18 ENCOUNTER — Ambulatory Visit (HOSPITAL_BASED_OUTPATIENT_CLINIC_OR_DEPARTMENT_OTHER): Payer: Medicare HMO

## 2014-05-18 VITALS — BP 124/73 | HR 87 | Temp 97.7°F | Resp 18

## 2014-05-18 DIAGNOSIS — Z5111 Encounter for antineoplastic chemotherapy: Secondary | ICD-10-CM | POA: Diagnosis not present

## 2014-05-18 DIAGNOSIS — C3431 Malignant neoplasm of lower lobe, right bronchus or lung: Secondary | ICD-10-CM

## 2014-05-18 DIAGNOSIS — Z79899 Other long term (current) drug therapy: Secondary | ICD-10-CM

## 2014-05-18 DIAGNOSIS — Z95828 Presence of other vascular implants and grafts: Secondary | ICD-10-CM

## 2014-05-18 LAB — COMPREHENSIVE METABOLIC PANEL (CC13)
ALK PHOS: 90 U/L (ref 40–150)
ALT: 38 U/L (ref 0–55)
AST: 26 U/L (ref 5–34)
Albumin: 3 g/dL — ABNORMAL LOW (ref 3.5–5.0)
Anion Gap: 12 mEq/L — ABNORMAL HIGH (ref 3–11)
BUN: 17.8 mg/dL (ref 7.0–26.0)
CO2: 24 mEq/L (ref 22–29)
CREATININE: 0.8 mg/dL (ref 0.7–1.3)
Calcium: 9.2 mg/dL (ref 8.4–10.4)
Chloride: 106 mEq/L (ref 98–109)
EGFR: >90 mL/min/{1.73_m2} (ref >90)
Glucose: 136 mg/dl (ref 70–140)
Potassium: 4.1 mEq/L (ref 3.5–5.1)
SODIUM: 142 meq/L (ref 136–145)
TOTAL PROTEIN: 6.9 g/dL (ref 6.4–8.3)
Total Bilirubin: 0.31 mg/dL (ref 0.20–1.20)

## 2014-05-18 LAB — CBC WITH DIFFERENTIAL/PLATELET
BASO%: 1.1 % (ref 0.0–2.0)
Basophils Absolute: 0.1 10*3/uL (ref 0.0–0.1)
EOS%: 1.3 % (ref 0.0–7.0)
Eosinophils Absolute: 0.1 10*3/uL (ref 0.0–0.5)
HCT: 31.2 % — ABNORMAL LOW (ref 38.4–49.9)
HGB: 10.4 g/dL — ABNORMAL LOW (ref 13.0–17.1)
LYMPH%: 16.1 % (ref 14.0–49.0)
MCH: 29.1 pg (ref 27.2–33.4)
MCHC: 33.4 g/dL (ref 32.0–36.0)
MCV: 87.1 fL (ref 79.3–98.0)
MONO#: 1.2 10*3/uL — AB (ref 0.1–0.9)
MONO%: 22.7 % — ABNORMAL HIGH (ref 0.0–14.0)
NEUT%: 58.8 % (ref 39.0–75.0)
NEUTROS ABS: 3.1 10*3/uL (ref 1.5–6.5)
Platelets: 633 10*3/uL — ABNORMAL HIGH (ref 140–400)
RBC: 3.58 10*6/uL — AB (ref 4.20–5.82)
RDW: 21.7 % — ABNORMAL HIGH (ref 11.0–14.6)
WBC: 5.4 10*3/uL (ref 4.0–10.3)
lymph#: 0.9 10*3/uL (ref 0.9–3.3)

## 2014-05-18 LAB — TSH CHCC: TSH: 2.482 m(IU)/L (ref 0.320–4.118)

## 2014-05-18 MED ORDER — SODIUM CHLORIDE 0.9 % IJ SOLN
10.0000 mL | INTRAMUSCULAR | Status: DC | PRN
  Administered 2014-05-18: 10 mL via INTRAVENOUS
  Filled 2014-05-18: qty 10

## 2014-05-18 MED ORDER — PROCHLORPERAZINE MALEATE 10 MG PO TABS
ORAL_TABLET | ORAL | Status: AC
  Filled 2014-05-18: qty 1

## 2014-05-18 MED ORDER — PROCHLORPERAZINE MALEATE 10 MG PO TABS
10.0000 mg | ORAL_TABLET | Freq: Once | ORAL | Status: AC
  Administered 2014-05-18: 10 mg via ORAL

## 2014-05-18 MED ORDER — SODIUM CHLORIDE 0.9 % IV SOLN
2000.0000 mg | Freq: Once | INTRAVENOUS | Status: AC
  Administered 2014-05-18: 2000 mg via INTRAVENOUS
  Filled 2014-05-18: qty 52.6

## 2014-05-18 MED ORDER — SODIUM CHLORIDE 0.9 % IV SOLN
Freq: Once | INTRAVENOUS | Status: AC
  Administered 2014-05-18: 11:00:00 via INTRAVENOUS

## 2014-05-18 MED ORDER — SODIUM CHLORIDE 0.9 % IJ SOLN
10.0000 mL | INTRAMUSCULAR | Status: DC | PRN
  Administered 2014-05-18: 10 mL
  Filled 2014-05-18: qty 10

## 2014-05-18 MED ORDER — HEPARIN SOD (PORK) LOCK FLUSH 100 UNIT/ML IV SOLN
500.0000 [IU] | Freq: Once | INTRAVENOUS | Status: AC | PRN
  Administered 2014-05-18: 500 [IU]
  Filled 2014-05-18: qty 5

## 2014-05-18 NOTE — Patient Instructions (Signed)

## 2014-05-18 NOTE — Patient Instructions (Signed)
[facility] Discharge Instructions for Patients Receiving Chemotherapy  Today you received the following chemotherapy agents [name]  To help prevent nausea and vomiting after your treatment, we encourage you to take your nausea medication as directed.    If you develop nausea and vomiting that is not controlled by your nausea medication, call the clinic.   BELOW ARE SYMPTOMS THAT SHOULD BE REPORTED IMMEDIATELY:  *FEVER GREATER THAN 100.5 F  *CHILLS WITH OR WITHOUT FEVER  NAUSEA AND VOMITING THAT IS NOT CONTROLLED WITH YOUR NAUSEA MEDICATION  *UNUSUAL SHORTNESS OF BREATH  *UNUSUAL BRUISING OR BLEEDING  TENDERNESS IN MOUTH AND THROAT WITH OR WITHOUT PRESENCE OF ULCERS  *URINARY PROBLEMS  *BOWEL PROBLEMS  UNUSUAL RASH Items with * indicate a potential emergency and should be followed up as soon as possible.  Feel free to call the clinic you have any questions or concerns. The clinic phone number is [phone].  Please show the CHEMO ALERT CARD at check-in to the Emergency Department and triage nurse.   

## 2014-05-20 ENCOUNTER — Telehealth: Payer: Self-pay | Admitting: *Deleted

## 2014-05-20 DIAGNOSIS — R079 Chest pain, unspecified: Secondary | ICD-10-CM

## 2014-05-20 NOTE — Telephone Encounter (Signed)
Patient called and stated that his indigestion/reflux pain had eased for awhile and now it has come back strong, the shooting pain is back. When he is in bed he gets about 90% relief but when he gets up it gets worse. Would like something stronger. Please Advise.

## 2014-05-21 ENCOUNTER — Telehealth: Payer: Self-pay | Admitting: Internal Medicine

## 2014-05-21 MED ORDER — SUCRALFATE 1 GM/10ML PO SUSP: 1.0000 g | Freq: Three times a day (TID) | ORAL | Status: DC

## 2014-05-21 NOTE — Telephone Encounter (Signed)
Patient notified and agreed. Needed refill on Carafate. Faxed to pharmacy.

## 2014-05-21 NOTE — Telephone Encounter (Signed)
returned call and s.w. pt and confirm appt

## 2014-05-21 NOTE — Telephone Encounter (Signed)
Unsure by what he means by stronger? Is he taking the carafate ACHS and omeprazole '40mg'$  daily routinely or as needed, he should be taking this medication scheduled. Can try pain medication as needed if still having pain. Otherwise will need to make follow up- see if you can get him in with Dr Nyoka Cowden sooner.

## 2014-05-24 ENCOUNTER — Telehealth: Payer: Self-pay | Admitting: *Deleted

## 2014-05-24 NOTE — Telephone Encounter (Signed)
Pt called wanting to know about appt for 05/25/14.  Spoke with pt that his next chemo is not due until 06/01/14 after office visit.  Appt for 05/25/14 will be for lab only, and CT scan 5/23.   Pt stated he had CT scan on 05/14/14. Informed pt that CT chest was done on 05/14/14; and pt will need CT Abdomen/Pelvis on 05/31/14. Pt wanted to know if Dr. Julien Nordmann still wants pt to repeat CT scan again on 05/31/14. Message sent to Dr. Julien Nordmann, and [name], desk nurse today. Pt's  Phone    [phone].

## 2014-05-24 NOTE — Telephone Encounter (Addendum)
Per Dr Julien Nordmann I notified pt that he wants him to have a CT of abd and pelvis, not chest. I called central scheduling called to cancel CT chest.

## 2014-05-25 ENCOUNTER — Other Ambulatory Visit: Payer: Medicare HMO

## 2014-05-25 ENCOUNTER — Telehealth: Payer: Self-pay | Admitting: *Deleted

## 2014-05-25 NOTE — Telephone Encounter (Signed)
TC from pt's wife regarding declining condition of Steve [name].  He is scheduled for labs today but is feeling so weak and short of breath that he cannot make the appt. Wife states that he is having trouble eating, not all food will go down easily, he is short of breath just walking from one room to another, having mid epigastric pain which his carafate and prilosec has not helped. Dr. Julien Nordmann is not in the office today. Discussed with his nurse, [name], RN. Best recourse is for pt to be evaluated at [facility].  Call made back to wife and shared this with her. She is in agreement and will get him to the ED this am. Asked her to keep [place] up to date as to what happens in the ED. She said she would.

## 2014-05-31 ENCOUNTER — Ambulatory Visit (HOSPITAL_COMMUNITY): Payer: Medicare HMO

## 2014-06-01 ENCOUNTER — Ambulatory Visit (HOSPITAL_BASED_OUTPATIENT_CLINIC_OR_DEPARTMENT_OTHER): Payer: Medicare HMO | Admitting: Physician Assistant

## 2014-06-01 ENCOUNTER — Telehealth: Payer: Self-pay | Admitting: *Deleted

## 2014-06-01 ENCOUNTER — Other Ambulatory Visit (HOSPITAL_BASED_OUTPATIENT_CLINIC_OR_DEPARTMENT_OTHER): Payer: Medicare HMO

## 2014-06-01 ENCOUNTER — Ambulatory Visit (HOSPITAL_BASED_OUTPATIENT_CLINIC_OR_DEPARTMENT_OTHER): Payer: Medicare HMO

## 2014-06-01 ENCOUNTER — Encounter: Payer: Self-pay | Admitting: Physician Assistant

## 2014-06-01 ENCOUNTER — Ambulatory Visit: Payer: Medicare HMO

## 2014-06-01 VITALS — BP 117/77 | HR 93 | Temp 97.5°F | Resp 18 | Ht 72.0 in | Wt 179.0 lb

## 2014-06-01 DIAGNOSIS — C3431 Malignant neoplasm of lower lobe, right bronchus or lung: Secondary | ICD-10-CM

## 2014-06-01 DIAGNOSIS — Z5111 Encounter for antineoplastic chemotherapy: Secondary | ICD-10-CM | POA: Diagnosis not present

## 2014-06-01 DIAGNOSIS — R072 Precordial pain: Secondary | ICD-10-CM

## 2014-06-01 DIAGNOSIS — Z95828 Presence of other vascular implants and grafts: Secondary | ICD-10-CM

## 2014-06-01 DIAGNOSIS — K219 Gastro-esophageal reflux disease without esophagitis: Secondary | ICD-10-CM | POA: Diagnosis not present

## 2014-06-01 DIAGNOSIS — R1319 Other dysphagia: Secondary | ICD-10-CM

## 2014-06-01 DIAGNOSIS — R0609 Other forms of dyspnea: Secondary | ICD-10-CM

## 2014-06-01 LAB — CBC WITH DIFFERENTIAL/PLATELET
BASO%: 0.3 % (ref 0.0–2.0)
Basophils Absolute: 0 10*3/uL (ref 0.0–0.1)
EOS ABS: 0.2 10*3/uL (ref 0.0–0.5)
EOS%: 1.6 % (ref 0.0–7.0)
HCT: 33.2 % — ABNORMAL LOW (ref 38.4–49.9)
HGB: 10.7 g/dL — ABNORMAL LOW (ref 13.0–17.1)
LYMPH#: 0.9 10*3/uL (ref 0.9–3.3)
LYMPH%: 7.4 % — ABNORMAL LOW (ref 14.0–49.0)
MCH: 29.6 pg (ref 27.2–33.4)
MCHC: 32.2 g/dL (ref 32.0–36.0)
MCV: 91.7 fL (ref 79.3–98.0)
MONO#: 1.1 10*3/uL — AB (ref 0.1–0.9)
MONO%: 9.2 % (ref 0.0–14.0)
NEUT%: 81.5 % — AB (ref 39.0–75.0)
NEUTROS ABS: 9.5 10*3/uL — AB (ref 1.5–6.5)
PLATELETS: 489 10*3/uL — AB (ref 140–400)
RBC: 3.62 10*6/uL — AB (ref 4.20–5.82)
RDW: 22.1 % — AB (ref 11.0–14.6)
WBC: 11.7 10*3/uL — ABNORMAL HIGH (ref 4.0–10.3)

## 2014-06-01 LAB — COMPREHENSIVE METABOLIC PANEL (CC13)
ALT: 21 U/L (ref 0–55)
AST: 18 U/L (ref 5–34)
Albumin: 3 g/dL — ABNORMAL LOW (ref 3.5–5.0)
Alkaline Phosphatase: 97 U/L (ref 40–150)
Anion Gap: 12 mEq/L — ABNORMAL HIGH (ref 3–11)
BUN: 19.1 mg/dL (ref 7.0–26.0)
CHLORIDE: 106 meq/L (ref 98–109)
CO2: 25 mEq/L (ref 22–29)
Calcium: 8.9 mg/dL (ref 8.4–10.4)
Creatinine: 0.7 mg/dL (ref 0.7–1.3)
EGFR: >90 mL/min/{1.73_m2} (ref >90)
Glucose: 163 mg/dl — ABNORMAL HIGH (ref 70–140)
Potassium: 4.3 mEq/L (ref 3.5–5.1)
SODIUM: 142 meq/L (ref 136–145)
Total Bilirubin: 0.34 mg/dL (ref 0.20–1.20)
Total Protein: 7 g/dL (ref 6.4–8.3)

## 2014-06-01 MED ORDER — SODIUM CHLORIDE 0.9 % IJ SOLN
10.0000 mL | INTRAMUSCULAR | Status: DC | PRN
  Administered 2014-06-01: 10 mL
  Filled 2014-06-01: qty 10

## 2014-06-01 MED ORDER — HEPARIN SOD (PORK) LOCK FLUSH 100 UNIT/ML IV SOLN
500.0000 [IU] | Freq: Once | INTRAVENOUS | Status: AC | PRN
  Administered 2014-06-01: 500 [IU]
  Filled 2014-06-01: qty 5

## 2014-06-01 MED ORDER — PROCHLORPERAZINE MALEATE 10 MG PO TABS
ORAL_TABLET | ORAL | Status: AC
  Filled 2014-06-01: qty 1

## 2014-06-01 MED ORDER — SODIUM CHLORIDE 0.9 % IV SOLN
2000.0000 mg | Freq: Once | INTRAVENOUS | Status: AC
  Administered 2014-06-01: 2000 mg via INTRAVENOUS
  Filled 2014-06-01: qty 52.6

## 2014-06-01 MED ORDER — SODIUM CHLORIDE 0.9 % IV SOLN
Freq: Once | INTRAVENOUS | Status: AC
  Administered 2014-06-01: 13:00:00 via INTRAVENOUS

## 2014-06-01 MED ORDER — SODIUM CHLORIDE 0.9 % IJ SOLN
10.0000 mL | INTRAMUSCULAR | Status: DC | PRN
  Administered 2014-06-01: 10 mL via INTRAVENOUS
  Filled 2014-06-01: qty 10

## 2014-06-01 MED ORDER — PROCHLORPERAZINE MALEATE 10 MG PO TABS
10.0000 mg | ORAL_TABLET | Freq: Once | ORAL | Status: AC
  Administered 2014-06-01: 10 mg via ORAL

## 2014-06-01 NOTE — Progress Notes (Addendum)
[facility] [phone]) [phone]   [phone]  OFFICE PROGRESS NOTE  [address], [place] [address]  DIAGNOSIS: Metastatic non-small cell lung cancer initially diagnosed as Stage IIIA (T2 B., N2, M0) non-small cell lung cancer consistent with poorly differentiated squamous cell carcinoma diagnosed in December of 2014.   Primary site: Lung (Right)  Staging method: AJCC 7th Edition  Clinical: Stage IIIA (T2b, N2, M0)  Summary: Stage IIIA (T2b, N2, M0)   PRIOR THERAPY:  1) Concurrent chemoradiation with weekly carboplatin for an AUC of 2 and paclitaxel 45 mg/m2, status post 4 cycles. 2) Consolidation chemotherapy with carboplatin for AUC of 5 and paclitaxel 175 mg/M2 every 3 weeks with Neulasta support, first cycle on 03/23/2013. Status post 3 cycles. 3) Immunotherapy with Nivolumab 3 mg/KG every 2 weeks. First dose 09/21/2013. Status post 12 cycles.   CURRENT THERAPY: Systemic chemotherapy with gemcitabine 1000 MG/M2 on days 1 and 8 every 3 weeks. First dose 03/30/2014. Status post 3 cycles.  DISEASE STAGE:  Lung cancer  Primary site: Lung (Right)  Staging method: AJCC 7th Edition  Clinical: Stage IIIA (T2b, N2, M0)  Summary: Stage IIIA (T2b, N2, M0)  CHEMOTHERAPY INTENT: Control/Palliative  CURRENT # OF CHEMOTHERAPY CYCLES: 4 CURRENT ANTIEMETICS: Zofran, dexamethasone, Compazine  CURRENT SMOKING STATUS: Former smoker, quit 01/09/2007  ORAL CHEMOTHERAPY AND CONSENT: n/a  CURRENT BISPHOSPHONATES USE: none  PAIN MANAGEMENT: Ibuprofen  NARCOTICS INDUCED CONSTIPATION: None  LIVING WILL AND CODE STATUS: ?  INTERVAL HISTORY: Steve [name] 74 y.o. male returns to the clinic today for followup visit accompanied by his wife. He is currently on treatment with single agent gemcitabine status post 3 cycles and tolerating his treatment fairly well. The patient continues to complain of pain in the central part of his chest which  improves after eating. His pain has decreased from a 10 down to a 2  With the prescription strength Prilosec and Carafate. He also has occasional episodes where food seems to get stuck mid-esophagus. This improves if he takes several sips of water.  He continues to have shortness of breath with exertion with mild cough without hemoptysis. He denied having any weight loss or night sweats. The patient has no fever or chills and no nausea or vomiting. He is here today to start cycle #4 of his chemotherapy.  MEDICAL HISTORY: Past Medical History  Diagnosis Date  . Dizziness and giddiness     positional vertigo  . Hyperlipidemia   . Impotence of organic origin   . Osteoarthritis, shoulder   . Allergy   . Hypothyroidism     pt denies thryoid disease, dr pandey note 11-19-12 says subclinical hypothryoid in epic  . COPD (chronic obstructive pulmonary disease)   . Insomnia   . Hx of radiation therapy 02/2013    right lung  . Diabetes mellitus without complication     pt denies diabetes, dr pandey note says dm 11-19-12 epic  . 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 Malignant neoplasm of bladder, part unspecified   . Malignant neoplasm of bronchus and lung, unspecified site     ALLERGIES:  is allergic to morphine and related.  MEDICATIONS:  Current Outpatient Prescriptions  Medication Sig Dispense Refill  . aspirin 81 MG tablet Take 81 mg by mouth daily.    . DULoxetine (CYMBALTA) 60 MG capsule     . flecainide (TAMBOCOR) 50 MG tablet Take 50 mg by  mouth.  6  . gabapentin (NEURONTIN) 300 MG capsule TAKE 1 CAPSULE BY MOUTH THREE TIMES DAILY 270 capsule 0  . metoprolol tartrate (LOPRESSOR) 25 MG tablet Take 1 tablet (25 mg total) by mouth 2 (two) times daily. 90 tablet 1  . midodrine (PROAMATINE) 2.5 MG tablet     . naproxen sodium (ANAPROX) 220 MG tablet Take 220 mg by mouth 2 (two) times daily with a meal.    . omeprazole (PRILOSEC) 40 MG capsule Take 1 capsule (40 mg total)  by mouth daily. 30 capsule 4  . simvastatin (ZOCOR) 20 MG tablet Take one tablet by mouth once daily to lower cholesterol 90 tablet 3  . sucralfate (CARAFATE) 1 GM/10ML suspension Take 10 mLs (1 g total) by mouth 4 (four) times daily -  with meals and at bedtime. 420 mL 3  . temazepam (RESTORIL) 30 MG capsule Take 1 capsule (30 mg total) by mouth at bedtime as needed for sleep. 30 capsule 0  . oxyCODONE-acetaminophen (PERCOCET/ROXICET) 5-325 MG per tablet      No current facility-administered medications for this visit.   Facility-Administered Medications Ordered in Other Visits  Medication Dose Route Frequency Provider Last Rate Last Dose  . Gemcitabine HCl ([name]) 2,000 mg in sodium chloride 0.9 % 100 mL chemo infusion  2,000 mg Intravenous Once Steve Bears, MD 305 mL/hr at 06/01/14 1415 2,000 mg at 06/01/14 1415  . heparin lock flush 100 unit/mL  500 Units Intracatheter Once PRN Steve Bears, MD      . sodium chloride 0.9 % injection 10 mL  10 mL Intracatheter PRN Steve Bears, MD        SURGICAL HISTORY:  Past Surgical History  Procedure Laterality Date  . Cystoscopy w/ dilation of bladder    . Tonsillectomy  age 59  . Endobronchial ultrasound Bilateral 12/15/2012    Procedure: ENDOBRONCHIAL ULTRASOUND;  Surgeon: Brand Males, MD;  Location: [facility];  Service: Cardiopulmonary;  Laterality: Bilateral;  . Colonoscopy  2009    [facility]    REVIEW OF SYSTEMS:  A comprehensive review of systems was negative except for: Constitutional: positive for anorexia and fatigue Respiratory: positive for dyspnea on exertion and pleurisy/chest pain Gastrointestinal: positive for dyspepsia   PHYSICAL EXAMINATION: General appearance: alert, cooperative and no distress Head: Normocephalic, without obvious abnormality, atraumatic Neck: no adenopathy, no JVD, supple, symmetrical, trachea midline and thyroid not enlarged, symmetric, no tenderness/mass/nodules Lymph nodes: Cervical,  supraclavicular, and axillary nodes normal. Resp: clear to auscultation bilaterally Back: symmetric, no curvature. ROM normal. No CVA tenderness. Cardio: regular rate and rhythm, S1, S2 normal, no murmur, click, rub or gallop GI: soft, non-tender; bowel sounds normal; no masses,  no organomegaly Extremities: extremities normal, atraumatic, no cyanosis or edema Neurologic: Alert and oriented X 3, normal strength and tone. Normal symmetric reflexes. Normal coordination and gait  ECOG PERFORMANCE STATUS: 1 - Symptomatic but completely ambulatory  Blood pressure 117/77, pulse 93, temperature 97.5 F (36.4 C), temperature source Oral, resp. rate 18, height 6' (1.829 m), weight 179 lb (81.194 kg), SpO2 98 %.  LABORATORY DATA: Lab Results  Component Value Date   WBC 11.7* 06/01/2014   HGB 10.7* 06/01/2014   HCT 33.2* 06/01/2014   MCV 91.7 06/01/2014   PLT 489* 06/01/2014      Chemistry      Component Value Date/Time   NA 142 06/01/2014 1108   NA 139 11/16/2013 0755   NA 142 04/28/2013 0819   K 4.3 06/01/2014 1108  K 4.1 11/16/2013 0755   CL 100 11/16/2013 0755   CO2 25 06/01/2014 1108   CO2 23 11/16/2013 0755   BUN 19.1 06/01/2014 1108   BUN 12 11/16/2013 0755   BUN 12 04/28/2013 0819   CREATININE 0.7 06/01/2014 1108   CREATININE 0.73 11/16/2013 0755      Component Value Date/Time   CALCIUM 8.9 06/01/2014 1108   CALCIUM 10.0 11/16/2013 0755   ALKPHOS 97 06/01/2014 1108   ALKPHOS 107 04/28/2013 0819   AST 18 06/01/2014 1108   AST 19 04/28/2013 0819   ALT 21 06/01/2014 1108   ALT 12 04/28/2013 0819   BILITOT 0.34 06/01/2014 1108   BILITOT 0.4 04/28/2013 0819       RADIOGRAPHIC STUDIES: Ct Angio Chest Pe W/cm &/or Wo Cm  05/14/2014   CLINICAL DATA:  rt lung ca '14 w/ no surgery, chemo only,, now w/ lt t cp w/ productive cough and sob, sob, ? pe or pneumonia, ex-smoker x 7 yrs  EXAM: CT ANGIOGRAPHY CHEST WITH CONTRAST  TECHNIQUE: Multidetector CT imaging of the chest was  performed using the standard protocol during bolus administration of intravenous contrast. Multiplanar CT image reconstructions and MIPs were obtained to evaluate the vascular anatomy.  CONTRAST:  80 mL Isovue 370 IV  COMPARISON:  03/19/2014  FINDINGS: Right IJ port catheter to the cavoatrial junction. Left arm contrast injection for CTA. The SVC is patent. Satisfactory opacification of pulmonary arteries noted, and there is no evidence of pulmonary emboli. 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 Remainder pulmonary veins remain patent. Moderate coronary calcifications. Adequate contrast opacification of the thoracic aorta with no evidence of dissection, aneurysm, or stenosis. There is classic 3-vessel brachiocephalic arch anatomy without proximal stenosis. Patchy aortic arch and descending segment calcifications.  Some increase in moderate right pleural effusion. Trace pericardial effusion now evident. Stable anterior mediastinal, right paratracheal, precarinal and prevascular sub cm lymph nodes. Bulky subcarinal adenopathy.  Consolidative mass in the right lower lobe with encasement and narrowing of proximal right lower lobe segmental bronchi and extensive atelectasis throughout much of the right lower lobe. Left lung remains clear.  Flowing osteophytes across multiple contiguous levels in the mid and lower thoracic spine. Sternum intact.  Visualized portions of upper abdomen unremarkable.  Review of the MIP images confirms the above findings.  IMPRESSION: 1. Negative for acute PE or thoracic aortic dissection. 2. Central right lower lobe mass with encasement of right lower lobe artery and inferior pulmonary vein as well as central airway narrowing and peripheral atelectasis/consolidation as before. 3. Persistent bulky subcarinal adenopathy. 4. Slight increase in moderate right pleural effusion   Electronically Signed   By: Lucrezia Europe M.D.    On: 05/14/2014 15:09   ASSESSMENT AND PLAN: This is a very pleasant 74 years old Zillmer male with:   1) metastatic non-small cell lung cancer initially diagnosed as stage IIIA non-small cell lung cancer who completed a course of concurrent chemoradiation with weekly carboplatin and paclitaxel with no significant complaints except for radiation induced odynophagia.  He also completed 3 cycles of consolidation chemotherapy with reduced dose carboplatin and paclitaxel. He completed 12 cycles of immunotherapy with Nivolumab and tolerated his treatment fairly well. Unfortunately the recent CT scan of the chest showed further increase in the size of the subcarinal nodal mass consistent with disease progression. The patient was started on systemic chemotherapy with single agent gemcitabine 1000 MG/M2 on days 1  and 8 every 3 weeks. He is status post 3 cycles. The patient is tolerating his chemotherapy fairly well with no significant adverse effects. The CT angiography study performed on 05/14/2014 was negative for pulmonary embolus and revealed stable disease. The patient was discussed with and also seen by Dr. Julien Nordmann. We will cancel the CT of the abdomen and pelvis scheduled for later this week and will obtain a CT of the chest, abdomen and pelvis with contrast after cycle #6. For his shortness of breath we will try an albuterol inhaler. This was sent to his pharmacy of record via [employer]  For the acid reflux and retrosternal pain, the patient will continue on Prilosec, Carafate as well as pain medication with oxycodone. If he continues to have issues, we will refer him to GI for further evaluation and management. We will also refer him to pulmonology for further evaluation and management of his dyspnea. He was advised to call immediately if he has any concerning symptoms in the interval.  The patient voices understanding of current disease status and treatment options and is in agreement with the current  care plan.  All questions were answered. The patient knows to call the clinic with any problems, questions or concerns. We can certainly see the patient much sooner if necessary.  Carlton Adam, PA-C 06/01/2014  ADDENDUM: Hematology/Oncology Attending: I had a face to face encounter with the patient. I recommended his care plan.  This is a very pleasant 74 years old Pelaez male with metastatic non-small cell lung cancer currently undergoing systemic chemotherapy with single agent gemcitabine status post 3 cycles. The patient is tolerating his systemic chemotherapy fairly well with no significant adverse effects except for mild fatigue. He continues to have shortness of breath at baseline and increased with exertion. The most recent CT scan of the chest showed no evidence for disease progression and no evidence for pulmonary embolism. I discussed the scan results with the patient and his wife today. I recommended for him to continue his current treatment with single agent gemcitabine for now. The patient continues to complain of acid reflux. He was advised to see his gastroenterologist for evaluation and the patient will continue on Prilosec and Carafate as well as pain medication.  He'll proceed with cycle #4 today as scheduled. The patient would come back for follow-up visit in 3 weeks for reevaluation before starting cycle #5. He was advised to call immediately if he has any concerning symptoms in the interval.  Disclaimer: This note was dictated with voice recognition software. Similar sounding words can inadvertently be transcribed and may be missed upon review. Eilleen Kempf., MD 06/07/2014

## 2014-06-01 NOTE — Telephone Encounter (Signed)
Per staff message and POF I have scheduled appts. Advised scheduler of appts. JMW  

## 2014-06-02 ENCOUNTER — Ambulatory Visit: Payer: Self-pay | Admitting: Internal Medicine

## 2014-06-03 ENCOUNTER — Other Ambulatory Visit: Payer: Self-pay | Admitting: *Deleted

## 2014-06-03 ENCOUNTER — Telehealth: Payer: Self-pay | Admitting: *Deleted

## 2014-06-03 ENCOUNTER — Ambulatory Visit (HOSPITAL_COMMUNITY): Payer: Medicare HMO

## 2014-06-03 MED ORDER — IPRATROPIUM-ALBUTEROL 20-100 MCG/ACT IN AERS: 1.0000 | INHALATION_SPRAY | Freq: Four times a day (QID) | RESPIRATORY_TRACT | Status: DC

## 2014-06-03 NOTE — Telephone Encounter (Signed)
TC from patient stating that he thought he was to get an inhaler called in to his pharmacy to help his breathing, but so far he has not gotten anything.  He states his breathing continues to be problematic and needs something to help with the SOB  and wheezing.  He received [name] on Tuesday after office visit with Awilda Metro, PA

## 2014-06-03 NOTE — Telephone Encounter (Signed)
error 

## 2014-06-04 ENCOUNTER — Other Ambulatory Visit: Payer: Self-pay | Admitting: Internal Medicine

## 2014-06-04 ENCOUNTER — Telehealth: Payer: Self-pay | Admitting: Medical Oncology

## 2014-06-04 ENCOUNTER — Other Ambulatory Visit: Payer: Self-pay | Admitting: *Deleted

## 2014-06-04 ENCOUNTER — Other Ambulatory Visit: Payer: Self-pay | Admitting: Medical Oncology

## 2014-06-04 DIAGNOSIS — G4700 Insomnia, unspecified: Secondary | ICD-10-CM

## 2014-06-04 MED ORDER — TEMAZEPAM 30 MG PO CAPS: 30.0000 mg | ORAL_CAPSULE | Freq: Every evening | ORAL | Status: DC | PRN

## 2014-06-04 NOTE — Telephone Encounter (Signed)
Did you check with Adrena?. He saw her last visit.

## 2014-06-04 NOTE — Telephone Encounter (Signed)
Patient requested and faxed to pharmacy 

## 2014-06-04 NOTE — Telephone Encounter (Signed)
Referral made and pt aware

## 2014-06-04 NOTE — Progress Notes (Signed)
Surescripts refill request received and denied due to Tiffany reed refilled rx.on 5/27

## 2014-06-05 NOTE — Patient Instructions (Signed)
Use the albuterol inhaler as prescribed Continue with labs and chemotherapy as scheduled Follow up in 3 weeks, prior to your next scheduled cycle of chemotherapy.

## 2014-06-08 ENCOUNTER — Other Ambulatory Visit (HOSPITAL_BASED_OUTPATIENT_CLINIC_OR_DEPARTMENT_OTHER): Payer: Medicare HMO

## 2014-06-08 ENCOUNTER — Ambulatory Visit: Payer: Medicare HMO

## 2014-06-08 ENCOUNTER — Other Ambulatory Visit: Payer: Medicare HMO

## 2014-06-08 ENCOUNTER — Ambulatory Visit (HOSPITAL_BASED_OUTPATIENT_CLINIC_OR_DEPARTMENT_OTHER): Payer: Medicare HMO

## 2014-06-08 VITALS — BP 127/87 | HR 90 | Temp 97.9°F | Resp 18

## 2014-06-08 DIAGNOSIS — Z95828 Presence of other vascular implants and grafts: Secondary | ICD-10-CM

## 2014-06-08 DIAGNOSIS — Z5111 Encounter for antineoplastic chemotherapy: Secondary | ICD-10-CM | POA: Diagnosis not present

## 2014-06-08 DIAGNOSIS — C3431 Malignant neoplasm of lower lobe, right bronchus or lung: Secondary | ICD-10-CM | POA: Diagnosis not present

## 2014-06-08 LAB — COMPREHENSIVE METABOLIC PANEL (CC13)
ALT: 47 U/L (ref 0–55)
ANION GAP: 11 meq/L (ref 3–11)
AST: 36 U/L — ABNORMAL HIGH (ref 5–34)
Albumin: 2.8 g/dL — ABNORMAL LOW (ref 3.5–5.0)
Alkaline Phosphatase: 93 U/L (ref 40–150)
BILIRUBIN TOTAL: 0.42 mg/dL (ref 0.20–1.20)
BUN: 17.8 mg/dL (ref 7.0–26.0)
CO2: 26 meq/L (ref 22–29)
Calcium: 9.6 mg/dL (ref 8.4–10.4)
Chloride: 103 mEq/L (ref 98–109)
Creatinine: 0.8 mg/dL (ref 0.7–1.3)
EGFR: 90 mL/min/{1.73_m2} — ABNORMAL LOW (ref >90)
Glucose: 191 mg/dl — ABNORMAL HIGH (ref 70–140)
Potassium: 4.2 mEq/L (ref 3.5–5.1)
SODIUM: 140 meq/L (ref 136–145)
Total Protein: 7.1 g/dL (ref 6.4–8.3)

## 2014-06-08 LAB — CBC WITH DIFFERENTIAL/PLATELET
BASO%: 0.8 % (ref 0.0–2.0)
Basophils Absolute: 0 10*3/uL (ref 0.0–0.1)
EOS%: 1 % (ref 0.0–7.0)
Eosinophils Absolute: 0 10*3/uL (ref 0.0–0.5)
HCT: 41.2 % (ref 38.4–49.9)
HGB: 13.4 g/dL (ref 13.0–17.1)
LYMPH%: 11.8 % — ABNORMAL LOW (ref 14.0–49.0)
MCH: 29.8 pg (ref 27.2–33.4)
MCHC: 32.5 g/dL (ref 32.0–36.0)
MCV: 91.6 fL (ref 79.3–98.0)
MONO#: 0.9 10*3/uL (ref 0.1–0.9)
MONO%: 21.5 % — AB (ref 0.0–14.0)
NEUT#: 2.6 10*3/uL (ref 1.5–6.5)
NEUT%: 64.9 % (ref 39.0–75.0)
NRBC: 1 % — AB (ref 0–0)
Platelets: 357 10*3/uL (ref 140–400)
RBC: 4.5 10*6/uL (ref 4.20–5.82)
RDW: 21.1 % — AB (ref 11.0–14.6)
WBC: 4 10*3/uL (ref 4.0–10.3)
lymph#: 0.5 10*3/uL — ABNORMAL LOW (ref 0.9–3.3)

## 2014-06-08 MED ORDER — SODIUM CHLORIDE 0.9 % IJ SOLN
10.0000 mL | INTRAMUSCULAR | Status: DC | PRN
  Administered 2014-06-08: 10 mL
  Filled 2014-06-08: qty 10

## 2014-06-08 MED ORDER — HEPARIN SOD (PORK) LOCK FLUSH 100 UNIT/ML IV SOLN
500.0000 [IU] | Freq: Once | INTRAVENOUS | Status: AC | PRN
  Administered 2014-06-08: 500 [IU]
  Filled 2014-06-08: qty 5

## 2014-06-08 MED ORDER — SODIUM CHLORIDE 0.9 % IV SOLN
2000.0000 mg | Freq: Once | INTRAVENOUS | Status: AC
  Administered 2014-06-08: 2000 mg via INTRAVENOUS
  Filled 2014-06-08: qty 52.63

## 2014-06-08 MED ORDER — PROCHLORPERAZINE MALEATE 10 MG PO TABS
ORAL_TABLET | ORAL | Status: AC
Start: 2014-06-08 — End: 2014-06-08
  Filled 2014-06-08: qty 1

## 2014-06-08 MED ORDER — SODIUM CHLORIDE 0.9 % IJ SOLN
10.0000 mL | INTRAMUSCULAR | Status: AC | PRN
  Administered 2014-06-08: 10 mL via INTRAVENOUS
  Filled 2014-06-08: qty 10

## 2014-06-08 MED ORDER — SODIUM CHLORIDE 0.9 % IV SOLN
Freq: Once | INTRAVENOUS | Status: AC
  Administered 2014-06-08: 13:00:00 via INTRAVENOUS

## 2014-06-08 MED ORDER — PROCHLORPERAZINE MALEATE 10 MG PO TABS
10.0000 mg | ORAL_TABLET | Freq: Once | ORAL | Status: AC
  Administered 2014-06-08: 10 mg via ORAL

## 2014-06-08 NOTE — Patient Instructions (Addendum)
[facility] Discharge Instructions for Patients Receiving Chemotherapy  Today you received the following chemotherapy agents [name]  To help prevent nausea and vomiting after your treatment, we encourage you to take your nausea medication as directed.    If you develop nausea and vomiting that is not controlled by your nausea medication, call the clinic.   BELOW ARE SYMPTOMS THAT SHOULD BE REPORTED IMMEDIATELY:  *FEVER GREATER THAN 100.5 F  *CHILLS WITH OR WITHOUT FEVER  NAUSEA AND VOMITING THAT IS NOT CONTROLLED WITH YOUR NAUSEA MEDICATION  *UNUSUAL SHORTNESS OF BREATH  *UNUSUAL BRUISING OR BLEEDING  TENDERNESS IN MOUTH AND THROAT WITH OR WITHOUT PRESENCE OF ULCERS  *URINARY PROBLEMS  *BOWEL PROBLEMS  UNUSUAL RASH Items with * indicate a potential emergency and should be followed up as soon as possible.  Feel free to call the clinic you have any questions or concerns. The clinic phone number is [phone].  Please show the CHEMO ALERT CARD at check-in to the Emergency Department and triage nurse.   

## 2014-06-09 ENCOUNTER — Telehealth: Payer: Self-pay | Admitting: Internal Medicine

## 2014-06-09 ENCOUNTER — Telehealth: Payer: Self-pay

## 2014-06-09 NOTE — Telephone Encounter (Signed)
Steve [name] stating that a referral was to be made ~06-01-14 to Dr. Chase Caller to breathing issues. m He has not received a call from pulmonary or Dr. Peggye Ley office about the appointment. He stated that he is having mor trouble breathing.

## 2014-06-09 NOTE — Telephone Encounter (Signed)
Called pt. Since MR not in office, he scheduled appt with RB tomorrow afternoon at 1:30. Nothing further needed

## 2014-06-09 NOTE — Telephone Encounter (Signed)
Spoke with Steve [name] and he stated that he called Dr. Golden Pop office himself and made an appointment with Dr. Lamonte Sakai tomorrow 06-10-14. He is really having difficulty breathing and has not heard from anyone. Apologized for no return phone call . Asked if he needed to go to the ED to be evaluated and he stated that he feels that he can live another day.  Encouraged Steve [name] to go to ED or call 911 if his breathing becomes worse. Pt. Verbalized understanding  Fowared note to Dr. Julien Nordmann and his nurse.

## 2014-06-10 ENCOUNTER — Encounter: Payer: Self-pay | Admitting: Emergency Medicine

## 2014-06-10 ENCOUNTER — Telehealth: Payer: Self-pay | Admitting: *Deleted

## 2014-06-10 ENCOUNTER — Ambulatory Visit (INDEPENDENT_AMBULATORY_CARE_PROVIDER_SITE_OTHER): Payer: Medicare HMO | Admitting: Emergency Medicine

## 2014-06-10 ENCOUNTER — Ambulatory Visit (INDEPENDENT_AMBULATORY_CARE_PROVIDER_SITE_OTHER)
Admission: RE | Admit: 2014-06-10 | Discharge: 2014-06-10 | Disposition: A | Discharge status: Home / Self Care | Payer: Medicare HMO | Source: Ambulatory Visit | Attending: Emergency Medicine | Admitting: Emergency Medicine

## 2014-06-10 VITALS — BP 110/70 | HR 113 | Ht 72.0 in | Wt 178.0 lb

## 2014-06-10 DIAGNOSIS — R0602 Shortness of breath: Secondary | ICD-10-CM | POA: Diagnosis not present

## 2014-06-10 DIAGNOSIS — R0609 Other forms of dyspnea: Secondary | ICD-10-CM | POA: Diagnosis not present

## 2014-06-10 NOTE — Patient Instructions (Signed)
Walking oximetry today on room air Chest x-ray today Continue Combivent 3 times a day to see if it offers any benefit Follow with Dr Chase Caller next available to review your CXR and to discuss your symptom progression.

## 2014-06-10 NOTE — Telephone Encounter (Signed)
TC from patient asking about a possible referral(s) for physician @ [facility] and somewhere else (pt couldn't remember) whom Dr. Julien Nordmann mentioned, to evaluate his cancer and his shortness of breath.  He saw Dr. Lamonte Sakai today and sees Dr. Chase Caller on Monday. Dr. Agustina Caroli note in [facility]. Mr. Streight feeling very frustrated and uncomfortable with his level of breathing difficulties and not getting any relief. He

## 2014-06-10 NOTE — Assessment & Plan Note (Signed)
Progressive dyspnea over several months time. I have reviewed his CT scan of his chest and also the oncology and pulmonary progress notes. The CT scan shows right-sided segmental consolidation with a hilar mass that is unchanged in size. He also has a moderate sized effusion. Certainly his significant atelectasis and effusion could be contributing to his shortness of breath. He also seems to have upper airway noise and it is unclear to me how much this may be contributing to his symptoms. He was tried empirically on Combivent and does not believe it is helped very much over the last 4 days. He had spirometry in February that showed mixed disease principally with restriction but also with some evidence of possible obstruction. He may need thoracentesis, is also possible that he has interstitial changes due to his cancer treatment or even worsening atelectasis although I do not hear evidence for this on exam. I believe he needs a chest x-ray now. We need to perform my walking oximetry to ensure that he is not experiencing the effects of desaturation. I will continue the empiric Combivent until he can be reevaluated by Dr. Chase Caller. He is at some risk for pulmonary embolism but his CT scan from one month ago did not show evidence for this.

## 2014-06-10 NOTE — Progress Notes (Signed)
Patient ID: Steve [name], male DOB: [birth year], 74 y.o. 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  He is s/p :  1) Concurrent chemoradiation with weekly carboplatin for an AUC of 2 and paclitaxel 45 mg/m2, status post 4 cycles. 2) Consolidation chemotherapy with carboplatin for AUC of 5 and paclitaxel 175 mg/M2 every 3 weeks with Neulasta support, first cycle on 03/23/2013. Status post 3 cycles.  But Currnt Rx: Immunotherapy with Nivolumab 3 mg/KG every 2 weeks. First dose 09/21/2013   OV 01/28/2014  Chief Complaint  Patient presents with  . Follow-up    Pt stated Dr. Earlie Server requested pt follow up with MR d/t chest congestion and abnormal CT-possible pna or fungal infection. Pt c/o DOE, increase in cough with brown mucus. Pt denies CP/tightness.      I have not seen him since Dec 2014 when I made the diagnosis of lung cancer and referrd him to Dr Julien Nordmann. His ECOG continues to be 0 to 1. Overall doing well except baseline mild class 2 dyspnea one exertion relieved by rest. He has been referred back due to findings of ILD new bilateral lower lobe GGO on surveillance CT chest 01/22/14. Hwoeve, there is no difference in symptoms. Review of imaging shows that in august 2015 on CT he developed RLL paramedian XRT fibrosis v enlarging tumor and sometime aftrer this in early sept 2015 was started on nivolumab (currently 12 weeks into this Rx). Then in Oct 2015 CT he had new pleural effusion that was tappe 1.5L straw colored fluids (not analyzed) 10/28/13 but did not notice symptom difference. Then fu imaging in Jan 2016 shows persistence of XRT changes since aug 2015 and the pleural effusion from Oct 2015 but also new bilateral LL GGO ( > 12 weeks into Nivolumab Rx).  Past, Family, Social reviewed: no change since last visit othe than  described above   OV 02/12/2014  Chief Complaint  Patient presents with  . Follow-up    Pt had thoracentesis. Pt stated his breathing is unchanged. Pt c/o cough with little mucus production - yellow in color. Pt questions if he has COPD. Pt denies CP/tightness.     Follow-up lung cancer patient on salvage monoclonal antibody nivolumab Rx. Along with right pleural effusion.  He was referred to last month for new onset of interstitial lung disease findings which fit in the timeframe of pneumonitis pulmonary toxicity related to his monoclonal antibody infusions. He also had right pleural effusion. He had a find it 500 cc successful almost complete thoracentesis yesterday. It is an exudate with predominantly lymphocytes. This no evidence of empyema the pH is normal. Cytology and microbiology cultures are pending. He feels baseline which is chronic unchanged shortness of breath and cough. He says in the past his inhalers are not helping. But he wonders if he has COPD. however spirometry today shows mild restriction with an FVC of 71%.        Acute OV 06/10/14 -- 74 year old gentleman followed by Dr. Chase Caller with a history of non-small cell lung cancer treated with chemotherapy and radiation therapy and now on salvage biologic therapy. He has dealt with apparent pneumonitis vs radiation pneumonitis.  He underwent CT chest 05/14/14 reviewed by me, shows a R effusion, RLL atx and unchanged R hilar mass.   He presents today c/o progressive exertional SOB over several month's time. He also  hears UA noise, has some associated cough. He is able to walk about 100 ft before he has to stop to rest.  He was started on combivent q6h a few days ago by Dr Julien Nordmann to see if he would respond. He hasn't noticed any change in his breathing since he started the combivent, has taken reliably for 4 days.   Past Medical History  Diagnosis Date  . Dizziness and giddiness     positional vertigo  . Hyperlipidemia    . Impotence of organic origin   . Osteoarthritis, shoulder   . Allergy   . Hypothyroidism     pt denies thryoid disease, dr pandey note 11-19-12 says subclinical hypothryoid in epic  . COPD (chronic obstructive pulmonary disease)   . Insomnia   . Hx of radiation therapy 02/2013    right lung  . Diabetes mellitus without complication     pt denies diabetes, dr pandey note says dm 11-19-12 epic  . 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 Malignant neoplasm of bladder, part unspecified   . Malignant neoplasm of bronchus and lung, unspecified site      Family History  Problem Relation Age of Onset  . Kidney disease Brother      History   Social History  . Marital Status: Married    Spouse Name: N/A  . Number of Children: N/A  . Years of Education: N/A   Occupational History  . business owner    Social History Main Topics  . Smoking status: Former Smoker -- 1.50 packs/day for 50 years    Types: Cigarettes    Quit date: 01/09/2007  . Smokeless tobacco: Current User    Types: Chew  . Alcohol Use: No  . Drug Use: No  . Sexual Activity: Not on file   Other Topics Concern  . Not on file   Social History Narrative     Allergies  Allergen Reactions  . Morphine And Related Hives     Outpatient Prescriptions Prior to Visit  Medication Sig Dispense Refill  . aspirin 81 MG tablet Take 81 mg by mouth daily.    . flecainide (TAMBOCOR) 50 MG tablet Take 50 mg by mouth.  6  . gabapentin (NEURONTIN) 300 MG capsule TAKE 1 CAPSULE BY MOUTH THREE TIMES DAILY 270 capsule 0  . Ipratropium-Albuterol (COMBIVENT) 20-100 MCG/ACT AERS respimat Inhale 1 puff into the lungs every 6 (six) hours. 1 Inhaler 2  . metoprolol tartrate (LOPRESSOR) 25 MG tablet Take 1 tablet (25 mg total) by mouth 2 (two) times daily. 90 tablet 1  . midodrine (PROAMATINE) 2.5 MG tablet Take 2.5 mg by mouth 2 (two) times daily with a meal.     . naproxen sodium (ANAPROX) 220 MG tablet Take 220 mg  by mouth 2 (two) times daily with a meal.    . omeprazole (PRILOSEC) 40 MG capsule Take 1 capsule (40 mg total) by mouth daily. 30 capsule 4  . simvastatin (ZOCOR) 20 MG tablet Take one tablet by mouth once daily to lower cholesterol 90 tablet 3  . temazepam (RESTORIL) 30 MG capsule Take 1 capsule (30 mg total) by mouth at bedtime as needed for sleep. 30 capsule 0  . DULoxetine (CYMBALTA) 60 MG capsule     . oxyCODONE-acetaminophen (PERCOCET/ROXICET) 5-325 MG per tablet     . sucralfate (CARAFATE) 1 GM/10ML suspension Take 10 mLs (1 g total) by mouth 4 (four) times daily -  with meals and at  bedtime. 420 mL 3   Facility-Administered Medications Prior to Visit  Medication Dose Route Frequency Provider Last Rate Last Dose  . sodium chloride 0.9 % injection 10 mL  10 mL Intravenous PRN Curt Bears, MD   10 mL at 06/08/14 1216    Filed Vitals:   06/10/14 1336 06/10/14 1337  BP:  110/70  Pulse:  113  Height: 6' (1.829 m)   Weight: 178 lb (80.74 kg)   SpO2:  98%   Gen: Pleasant, well-nourished, in no distress,  normal affect  ENT: No lesions,  mouth clear,  oropharynx clear, no postnasal drip  Neck: No JVD, no TMG, no carotid bruits, some mild stridor  Lungs: No use of accessory muscles, decreased at R base, clear without rales or rhonchi  Cardiovascular: RRR, heart sounds normal, no murmur or gallops, no peripheral edema  Musculoskeletal: No deformities, no cyanosis or clubbing  Neuro: alert, non focal  Skin: Warm, no lesions or rashes   CT chest 05/14/14 --  COMPARISON: 03/19/2014  FINDINGS: Right IJ port catheter to the cavoatrial junction. Left arm contrast injection for CTA. The SVC is patent. Satisfactory opacification of pulmonary arteries noted, and there is no evidence of pulmonary emboli. 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 Remainder pulmonary veins remain patent. Moderate coronary  calcifications. Adequate contrast opacification of the thoracic aorta with no evidence of dissection, aneurysm, or stenosis. There is classic 3-vessel brachiocephalic arch anatomy without proximal stenosis. Patchy aortic arch and descending segment calcifications.  Some increase in moderate right pleural effusion. Trace pericardial effusion now evident. Stable anterior mediastinal, right paratracheal, precarinal and prevascular sub cm lymph nodes. Bulky subcarinal adenopathy.  Consolidative mass in the right lower lobe with encasement and narrowing of proximal right lower lobe segmental bronchi and extensive atelectasis throughout much of the right lower lobe. Left lung remains clear.  Flowing osteophytes across multiple contiguous levels in the mid and lower thoracic spine. Sternum intact.  Visualized portions of upper abdomen unremarkable.  Review of the MIP images confirms the above findings.  IMPRESSION: 1. Negative for acute PE or thoracic aortic dissection. 2. Central right lower lobe mass with encasement of right lower lobe artery and inferior pulmonary vein as well as central airway narrowing and peripheral atelectasis/consolidation as before. 3. Persistent bulky subcarinal adenopathy. 4. Slight increase in moderate right pleural effusion  Dyspnea on exertion Progressive dyspnea over several months time. I have reviewed his CT scan of his chest and also the oncology and pulmonary progress notes. The CT scan shows right-sided segmental consolidation with a hilar mass that is unchanged in size. He also has a moderate sized effusion. Certainly his significant atelectasis and effusion could be contributing to his shortness of breath. He also seems to have upper airway noise and it is unclear to me how much this may be contributing to his symptoms. He was tried empirically on Combivent and does not believe it is helped very much over the last 4 days. He had spirometry in  February that showed mixed disease principally with restriction but also with some evidence of possible obstruction. He may need thoracentesis, is also possible that he has interstitial changes due to his cancer treatment or even worsening atelectasis although I do not hear evidence for this on exam. I believe he needs a chest x-ray now. We need to perform my walking oximetry to ensure that he is not experiencing the effects of desaturation. I will  continue the empiric Combivent until he can be reevaluated by Dr. Chase Caller. He is at some risk for pulmonary embolism but his CT scan from one month ago did not show evidence for this.

## 2014-06-11 ENCOUNTER — Other Ambulatory Visit: Payer: Self-pay | Admitting: Medical Oncology

## 2014-06-11 NOTE — Telephone Encounter (Signed)
I called pt -he had cxr yesterday and I told him to contact  Dr Lamonte Sakai for results. He also had sat testing at that visit and he said "my oxygen level never fell  below 95%". I told him i sent a message to [facility] re referral and I will let him know as soon as I hear back from him.

## 2014-06-11 NOTE — Telephone Encounter (Signed)
I called pt about possible referral to Dr Kerin Ransom at [name] and that he is a pulmonologist. Steve [name] stated he is seeing Ramaswamy on Monday . He will wait until after visit with [name] and he said he is " not going to take anymore chemo until this lung problem is figured out". Note to [place].I told pt to go to ED if he gets worse .

## 2014-06-14 ENCOUNTER — Ambulatory Visit (INDEPENDENT_AMBULATORY_CARE_PROVIDER_SITE_OTHER): Payer: Medicare HMO | Admitting: Internal Medicine

## 2014-06-14 ENCOUNTER — Telehealth: Payer: Self-pay | Admitting: *Deleted

## 2014-06-14 ENCOUNTER — Encounter: Payer: Self-pay | Admitting: Internal Medicine

## 2014-06-14 VITALS — BP 110/62 | HR 53 | Ht 72.0 in | Wt 177.0 lb

## 2014-06-14 DIAGNOSIS — R0609 Other forms of dyspnea: Secondary | ICD-10-CM

## 2014-06-14 DIAGNOSIS — R062 Wheezing: Secondary | ICD-10-CM

## 2014-06-14 DIAGNOSIS — R1314 Dysphagia, pharyngoesophageal phase: Secondary | ICD-10-CM | POA: Diagnosis not present

## 2014-06-14 NOTE — Progress Notes (Signed)
Subjective:    Patient ID: Steve [name], male    DOB: 07-08-1940, 74 y.o.   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   He is s/p :  1) Concurrent chemoradiation with weekly carboplatin for an AUC of 2 and paclitaxel 45 mg/m2, status post 4 cycles. 2) Consolidation chemotherapy with carboplatin for AUC of 5 and paclitaxel 175 mg/M2 every 3 weeks with Neulasta support, first cycle on 03/23/2013. Status post 3 cycles. 3) Immunotherapy with Nivolumab 3 mg/KG every 2 weeks. First dose 09/21/2013. Status post 12 cycles. CURRENT THERAPY: Systemic chemotherapy with gemcitabine 1000 MG/M2 on days 1 and 8 every 3 weeks. First dose 03/30/2014. Status post 4 cycles 06/01/14    OV 01/28/2014  Chief Complaint  Patient presents with  . Follow-up    Pt stated Dr. Earlie Server requested pt follow up with MR d/t chest congestion and abnormal CT-possible pna or fungal infection. Pt c/o DOE, increase in cough with brown mucus. Pt denies CP/tightness.       I have not seen him since Dec 2014 when I made the diagnosis of lung cancer and referrd him to Dr Julien Nordmann. His ECOG continues to be 0 to 1. Overall doing well except baseline mild class 2 dyspnea one exertion relieved by rest. He has been referred back due to findings of ILD new bilateral lower lobe GGO on surveillance  CT chest 01/22/14. Hwoeve, there is no difference in symptoms. Review of imaging shows that in august 2015 on CT he developed RLL paramedian XRT fibrosis v enlarging tumor and sometime aftrer this in early sept 2015 was started on nivolumab (currently 12 weeks into this Rx). Then in Oct 2015 CT he had new pleural effusion that was tappe 1.5L straw colored fluids (not analyzed) 10/28/13 but did not notice symptom difference. Then fu imaging in Jan 2016 shows persistence of XRT changes since  aug 2015 and the pleural effusion from Oct 2015 but also new bilateral LL GGO ( > 12 weeks into Nivolumab Rx).  Past, Family, Social reviewed: no change since last visit othe than described above   OV 02/12/2014  Chief Complaint  Patient presents with  . Follow-up    Pt had thoracentesis. Pt stated his breathing is unchanged. Pt c/o cough with little mucus production - yellow in color. Pt questions if he has COPD. Pt denies CP/tightness.     Follow-up lung cancer patient on salvage monoclonal antibody nivolumab  Rx. Along with right pleural effusion.  He was referred to last month for new onset of interstitial lung disease findings which fit in the timeframe of pneumonitis pulmonary toxicity related to his monoclonal antibody infusions. He also had right pleural effusion. He had a find it 500 cc successful almost complete thoracentesis yesterday. It is an exudate with predominantly lymphocytes. This no evidence of empyema the pH is normal. Cytology and microbiology cultures are pending (later non-diagnostic). He feels baseline which is chronic unchanged shortness of breath and cough. He says in the past his inhalers are not helping. But he wonders if he has COPD. however spirometry today shows mild restriction with an FVC of 71%.  ECOG 0   Acute OV 06/10/14 -- 74 year old gentleman followed by Dr. Chase Caller with a history of non-small cell lung cancer treated with chemotherapy and radiation therapy and now on salvage biologic therapy. He has dealt  with apparent pneumonitis vs radiation pneumonitis.  He underwent CT chest 05/14/14 reviewed by me, shows a R effusion, RLL atx and unchanged R hilar mass.   He presents today c/o progressive exertional SOB over several month's time. He also hears UA noise, has some associated cough. He is able to walk about 100 ft before he has to stop to rest.  He was started on combivent q6h a few days ago by Dr Julien Nordmann to see if he would respond. He hasn't noticed any  change in his breathing since he started the combivent, has taken reliably for 4 days.      OV 06/14/2014  Chief Complaint  Patient presents with  . Follow-up    Pt last seen on 6/2 by RB for an acute visit. Pt c/o increase SOB. Pt currently recieving chemo. Pt c/o dry cough and chest tightness.     74 year old male lung cancer with shortness of breath  Last seen February 2016. At that time concern was Nivolumab pulmonary toxicity but he was relatively asymptomatic and potential mild findings on CT chest, minimal. Therefore he opted for expectant follow-up with his oncologist. However since then he's had progressive dyspnea on exertion. Rates it as severe. Notices it when he climbs up stairs. In our office a few days ago 185 feet 3 laps on room air: He did not desaturated and complained of mild dyspnea. It is associated with pericardial pain but he says his cardiac workup was negative.   It is also associated with dysphagia that is persistent and moderate since his radiation therapy - early 2015. He reports it as "food is stuck". Continues to be that way. Reports this as a new problem to me  He is also having a weak cough and associated wheezing. Feels wheezing is upper airway. In addition: His oncology notes were reviewed. Most recently seen end of May 2016. He is on gemcitabine chemotherapy. He says this is worse than previous regimen off Nivolumab and leaves and exhausted. He denies any peripheral neuropathy with it    CT scan of the chest 05/14/2014: Personally reviewedimage  and pulmonary embolism ruled out. He has a right lower lobe mass with subcarinal adenopathy and pleural effusion. Chest x-ray a few days ago personally reviewed image: His effusion is small [we did do a thoracentesis on this effusion February 2016 and he only had reactive mesothelial cells]   Lab review 06/08/2014: Normal renal function creatinine 0.8 mg percent and normal hemoglobin 13.4 g percent    Current  outpatient prescriptions:  .  aspirin 81 MG tablet, Take 81 mg by mouth daily., Disp: , Rfl:  .  DULoxetine (CYMBALTA) 60 MG capsule, , Disp: , Rfl:  .  flecainide (TAMBOCOR) 50 MG tablet, Take 50 mg by mouth., Disp: , Rfl: 6 .  gabapentin (NEURONTIN) 300 MG capsule, TAKE 1 CAPSULE BY MOUTH THREE TIMES DAILY, Disp: 270 capsule, Rfl: 0 .  Ipratropium-Albuterol (COMBIVENT) 20-100 MCG/ACT AERS respimat, Inhale 1 puff into the lungs every 6 (six) hours., Disp: 1 Inhaler, Rfl: 2 .  metoprolol tartrate (LOPRESSOR) 25 MG tablet, Take 1 tablet (25 mg total) by mouth 2 (two) times daily., Disp: 90 tablet, Rfl: 1 .  midodrine (PROAMATINE) 2.5 MG tablet, Take 2.5 mg by mouth 2 (two) times daily with a meal. , Disp: , Rfl:  .  naproxen sodium (ANAPROX) 220 MG tablet, Take 220 mg by mouth 2 (two) times daily with a meal., Disp: , Rfl:  .  omeprazole (PRILOSEC) 40  MG capsule, Take 1 capsule (40 mg total) by mouth daily., Disp: 30 capsule, Rfl: 4 .  simvastatin (ZOCOR) 20 MG tablet, Take one tablet by mouth once daily to lower cholesterol, Disp: 90 tablet, Rfl: 3 .  temazepam (RESTORIL) 30 MG capsule, Take 1 capsule (30 mg total) by mouth at bedtime as needed for sleep., Disp: 30 capsule, Rfl: 0 No current facility-administered medications for this visit.  Facility-Administered Medications Ordered in Other Visits:  .  sodium chloride 0.9 % injection 10 mL, 10 mL, Intravenous, PRN, Curt Bears, MD, 10 mL at 06/08/14 1216    Review of Systems  Constitutional: Negative for fever and unexpected weight change.  HENT: Negative for congestion, dental problem, ear pain, nosebleeds, postnasal drip, rhinorrhea, sinus pressure, sneezing, sore throat and trouble swallowing.   Eyes: Negative for redness and itching.  Respiratory: Positive for cough, chest tightness and shortness of breath. Negative for wheezing.   Cardiovascular: Negative for palpitations and leg swelling.  Gastrointestinal: Negative for nausea and  vomiting.  Genitourinary: Negative for dysuria.  Musculoskeletal: Negative for joint swelling.  Skin: Negative for rash.  Neurological: Negative for headaches.  Hematological: Does not bruise/bleed easily.  Psychiatric/Behavioral: Negative for dysphoric mood. The patient is not nervous/anxious.        Objective:   Physical Exam  Constitutional: He is oriented to person, place, and time. He appears well-developed and well-nourished. No distress.  HENT:  Head: Normocephalic and atraumatic.  Right Ear: External ear normal.  Left Ear: External ear normal.  Mouth/Throat: Oropharynx is clear and moist. No oropharyngeal exudate.  Eyes: Conjunctivae and EOM are normal. Pupils are equal, round, and reactive to light. Right eye exhibits no discharge. Left eye exhibits no discharge. No scleral icterus.  Neck: Normal range of motion. Neck supple. No JVD present. No tracheal deviation present. No thyromegaly present.  Cardiovascular: Normal rate, regular rhythm and intact distal pulses.  Exam reveals no gallop and no friction rub.   No murmur heard. Pulmonary/Chest: Effort normal and breath sounds normal. No respiratory distress. He has no wheezes. He has no rales. He exhibits no tenderness.  Weak cough Scattered wheezes  Abdominal: Soft. Bowel sounds are normal. He exhibits no distension and no mass. There is no tenderness. There is no rebound and no guarding.  Musculoskeletal: Normal range of motion. He exhibits no edema or tenderness.  Lymphadenopathy:    He has no cervical adenopathy.  Neurological: He is alert and oriented to person, place, and time. He has normal reflexes. No cranial nerve deficit. Coordination normal.  Skin: Skin is warm and dry. No rash noted. He is not diaphoretic. No erythema. No pallor.  Psychiatric: He has a normal mood and affect. His behavior is normal. Judgment and thought content normal.  Nursing note and vitals reviewed.   Filed Vitals:   06/14/14 1335  BP:  110/62  Pulse: 53  Height: 6' (1.829 m)  Weight: 177 lb (80.287 kg)  SpO2: 98%         Assessment & Plan:   1. Dyspnea on exertion with wheezing with weak cough and upper airway noise - worse - my concern is this might be upper airway vocal cord issues + other issues of cancer fatigue, effusion right, lung mass all contributing to dyspnea - appears PE rule out April 2016 and by hx negative stres test in jan 2016 PLAN - Pulmonary Function Test; - if vocal cord issues than more important to finish ENT workup. IF distal obstn - can  R with him oral steroids - ENT consult - for now continue inhalers  2. Dysphagia, pharyngoesophageal phase - chronic problem but new to me  - >? XRT related PLAN - Ambulatory referral to Gastroenterology     Dr. Brand Males, M.D., [facility].C.P Pulmonary and Critical Care Medicine Staff Physician [place] [facility] Pager: [phone], If no answer or between  15:00h - 7:00h: call [phone]  06/14/2014 10:37 PM

## 2014-06-14 NOTE — Telephone Encounter (Signed)
Message from [place] stating pt needs to see MD before taking any more chemo. Returned call, she reports pt can hardly leave the house due to dyspnea and L side chest pain. Reports this pain is sharp and "spiking." Pt saw Dr. Chase Caller today, has PFT scheduled for 06/15/14. Informed her pt will see Dr. Julien Nordmann with NP on 6/14. She is unsure he can wait until then. Requesting sooner appointment with MD to review everything that is going on. Informed her MD will be back in office on 06/15/14. Will review with him then. They will keep follow up appointments with pulmonology. Steve [name] understands to take pt to ED if he develops worsening dyspnea or pain in the interim. Pt is also having esophageal pain. Taking Carafate with little relief.

## 2014-06-14 NOTE — Patient Instructions (Addendum)
ICD-9-CM ICD-10-CM   1. Dyspnea on exertion 786.09 R06.09   2. Dysphagia, pharyngoesophageal phase 787.24 R13.14   3. Wheezing 786.07 R06.2     Unclear why worse Wheezing could be vocal cord related  PLAN Full PFT next 2 days any location - call for results [phone] to advise you of next step REfer ENT - evaluate vocal cords REfer GI - evaluate dysphagia  Followup Call [facility] as soon as you finish PFT so I can advise on prednisone 4 weeks with NP after completing all of above

## 2014-06-15 ENCOUNTER — Ambulatory Visit (INDEPENDENT_AMBULATORY_CARE_PROVIDER_SITE_OTHER): Payer: Medicare HMO | Admitting: Internal Medicine

## 2014-06-15 DIAGNOSIS — R0609 Other forms of dyspnea: Secondary | ICD-10-CM

## 2014-06-15 LAB — PULMONARY FUNCTION TEST
DL/VA % pred: 59 %
DL/VA: 2.82 ml/min/mmHg/L
DLCO unc % pred: 38 %
DLCO unc: 13.38 ml/min/mmHg
FEF 25-75 PRE: 1.75 L/s
FEF2575-%PRED-PRE: 69 %
FEV1-%PRED-PRE: 72 %
FEV1-Pre: 2.46 L
FEV1FVC-%Pred-Pre: 90 %
FEV6-%Pred-Pre: 84 %
FEV6-Pre: 3.7 L
FEV6FVC-%PRED-PRE: 106 %
FVC-%Pred-Pre: 79 %
FVC-Pre: 3.71 L
PRE FEV1/FVC RATIO: 66 %
PRE FEV6/FVC RATIO: 100 %
RV % pred: 89 %
RV: 2.34 L
TLC % pred: 72 %
TLC: 5.39 L

## 2014-06-15 NOTE — Progress Notes (Signed)
Spirometry done today. 

## 2014-06-15 NOTE — Telephone Encounter (Signed)
Reviewed concern with MD, called pt's wife discussed MD does not have any appts sooner than 6/14, her will discuss pt and wife concerns at that time. No further concerns at this time.

## 2014-06-16 ENCOUNTER — Telehealth: Payer: Self-pay | Admitting: Internal Medicine

## 2014-06-16 MED ORDER — PREDNISONE 10 MG PO TABS: ORAL_TABLET | ORAL | Status: DC

## 2014-06-16 NOTE — Telephone Encounter (Signed)
Below are cxr findings - none is new   IMPRESSION: Again noted right lower lobe central mass.  Small right pleural effusion with right basilar atelectasis.  Left lung is clear.  No pulmonary edema.    Electronically Signed  By: Lahoma Crocker M.D.  On: 06/10/2014 16:31

## 2014-06-16 NOTE — Telephone Encounter (Signed)
PFt Under whelming for degree of dyspnea - ? Still vocal cord issues  Plan Keep up ent consult Also try Take prednisone 40 mg daily x 2 days, then '20mg'$  daily x 2 days, then '10mg'$  daily x 2 days, then '5mg'$  daily x 2 days and stop   Thanks  Dr. Brand Males, M.D., [facility].C.P Pulmonary and Critical Care Medicine Staff Physician [place] [facility] Pager: [phone], If no answer or between  15:00h - 7:00h: call [phone]  06/16/2014 12:50 PM

## 2014-06-16 NOTE — Telephone Encounter (Signed)
Called and spoke to pt. Informed him of the results per MR. Pt verbalized understanding and denied any further questions or concerns at this time.  

## 2014-06-16 NOTE — Telephone Encounter (Signed)
Spoke with pt and advised of Dr Golden Pop recommendations.  Rx for Prednisone sent. Pt would also like cxr results from 06/10/14 (In Epic).  Please advise

## 2014-06-22 ENCOUNTER — Ambulatory Visit (HOSPITAL_BASED_OUTPATIENT_CLINIC_OR_DEPARTMENT_OTHER): Payer: Medicare HMO | Admitting: Oncology

## 2014-06-22 ENCOUNTER — Telehealth: Payer: Self-pay | Admitting: *Deleted

## 2014-06-22 ENCOUNTER — Telehealth: Payer: Self-pay | Admitting: Oncology

## 2014-06-22 ENCOUNTER — Telehealth: Payer: Self-pay

## 2014-06-22 ENCOUNTER — Encounter: Payer: Self-pay | Admitting: Oncology

## 2014-06-22 ENCOUNTER — Other Ambulatory Visit (HOSPITAL_BASED_OUTPATIENT_CLINIC_OR_DEPARTMENT_OTHER): Payer: Medicare HMO

## 2014-06-22 ENCOUNTER — Ambulatory Visit: Payer: Medicare HMO

## 2014-06-22 VITALS — BP 111/74 | HR 100 | Temp 97.4°F | Resp 18 | Ht 72.0 in | Wt 176.7 lb

## 2014-06-22 DIAGNOSIS — Z95828 Presence of other vascular implants and grafts: Secondary | ICD-10-CM

## 2014-06-22 DIAGNOSIS — R0600 Dyspnea, unspecified: Secondary | ICD-10-CM | POA: Diagnosis not present

## 2014-06-22 DIAGNOSIS — C3431 Malignant neoplasm of lower lobe, right bronchus or lung: Secondary | ICD-10-CM

## 2014-06-22 LAB — COMPREHENSIVE METABOLIC PANEL (CC13)
ALT: 42 U/L (ref 0–55)
AST: 27 U/L (ref 5–34)
Albumin: 3.1 g/dL — ABNORMAL LOW (ref 3.5–5.0)
Alkaline Phosphatase: 120 U/L (ref 40–150)
Anion Gap: 13 mEq/L — ABNORMAL HIGH (ref 3–11)
BUN: 20.2 mg/dL (ref 7.0–26.0)
CALCIUM: 9.9 mg/dL (ref 8.4–10.4)
CHLORIDE: 103 meq/L (ref 98–109)
CO2: 26 mEq/L (ref 22–29)
CREATININE: 0.8 mg/dL (ref 0.7–1.3)
EGFR: 87 mL/min/{1.73_m2} — ABNORMAL LOW (ref >90)
Glucose: 117 mg/dl (ref 70–140)
Potassium: 4.1 mEq/L (ref 3.5–5.1)
Sodium: 141 mEq/L (ref 136–145)
Total Bilirubin: 0.38 mg/dL (ref 0.20–1.20)
Total Protein: 7.4 g/dL (ref 6.4–8.3)

## 2014-06-22 LAB — CBC WITH DIFFERENTIAL/PLATELET
BASO%: 0.1 % (ref 0.0–2.0)
Basophils Absolute: 0 10*3/uL (ref 0.0–0.1)
EOS%: 0.7 % (ref 0.0–7.0)
Eosinophils Absolute: 0.1 10*3/uL (ref 0.0–0.5)
HCT: 37.3 % — ABNORMAL LOW (ref 38.4–49.9)
HGB: 12.1 g/dL — ABNORMAL LOW (ref 13.0–17.1)
LYMPH%: 4.8 % — ABNORMAL LOW (ref 14.0–49.0)
MCH: 30.9 pg (ref 27.2–33.4)
MCHC: 32.4 g/dL (ref 32.0–36.0)
MCV: 95.2 fL (ref 79.3–98.0)
MONO#: 2.6 10*3/uL — ABNORMAL HIGH (ref 0.1–0.9)
MONO%: 13.3 % (ref 0.0–14.0)
NEUT%: 81.1 % — ABNORMAL HIGH (ref 39.0–75.0)
NEUTROS ABS: 16.1 10*3/uL — AB (ref 1.5–6.5)
Platelets: 613 10*3/uL — ABNORMAL HIGH (ref 140–400)
RBC: 3.92 10*6/uL — AB (ref 4.20–5.82)
RDW: 21.6 % — ABNORMAL HIGH (ref 11.0–14.6)
WBC: 19.8 10*3/uL — ABNORMAL HIGH (ref 4.0–10.3)
lymph#: 1 10*3/uL (ref 0.9–3.3)

## 2014-06-22 MED ORDER — SODIUM CHLORIDE 0.9 % IJ SOLN
10.0000 mL | INTRAMUSCULAR | Status: DC | PRN
  Administered 2014-06-22: 10 mL via INTRAVENOUS
  Filled 2014-06-22: qty 10

## 2014-06-22 MED ORDER — LIDOCAINE VISCOUS 2 % MT SOLN: 20.0000 mL | Freq: Four times a day (QID) | OROMUCOSAL | Status: DC | PRN

## 2014-06-22 MED ORDER — HEPARIN SOD (PORK) LOCK FLUSH 100 UNIT/ML IV SOLN
500.0000 [IU] | Freq: Once | INTRAVENOUS | Status: AC
  Administered 2014-06-22: 500 [IU] via INTRAVENOUS
  Filled 2014-06-22: qty 5

## 2014-06-22 NOTE — Patient Instructions (Signed)

## 2014-06-22 NOTE — Telephone Encounter (Signed)
Pt called asking when the new Rx for pain medication for his esophagus will be ready. Called [facility] and they just received RX. Called pt back and he asked if he swallows the medication. Clarified with Erasmo Downer NP and called pt back again. He is to swallow the lidocaine. This is to help him until he sees GI md on Monday.

## 2014-06-22 NOTE — Telephone Encounter (Signed)
Patient called and would like to know if he should continue to take the flecainide as he has not had any dizziness or syncope in a while. Please advise. Thanks, MI

## 2014-06-22 NOTE — Telephone Encounter (Signed)
per pof to sch pt appt-sent MW emailt o sch trmt-pt trmt CX today & 6/21 per pof-pt aware-gave avs

## 2014-06-22 NOTE — Progress Notes (Signed)
[place] [phone]   [phone]  OFFICE PROGRESS NOTE  GREEN, Viviann Spare, MD 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   Primary site: Lung (Right)  Staging method: AJCC 7th Edition  Clinical: Stage IIIA (T2b, N2, M0)  Summary: Stage IIIA (T2b, N2, M0)   PRIOR THERAPY:  1) Concurrent chemoradiation with weekly carboplatin for an AUC of 2 and paclitaxel 45 mg/m2, status post 4 cycles. 2) Consolidation chemotherapy with carboplatin for AUC of 5 and paclitaxel 175 mg/M2 every 3 weeks with Neulasta support, first cycle on 03/23/2013. Status post 3 cycles. 3) Immunotherapy with Nivolumab 3 mg/KG every 2 weeks. First dose 09/21/2013. Status post 12 cycles.   CURRENT THERAPY: Systemic chemotherapy with gemcitabine 1000 MG/M2 on days 1 and 8 every 3 weeks. First dose 03/30/2014. Status post 4 cycles.  DISEASE STAGE:  Lung cancer  Primary site: Lung (Right)  Staging method: AJCC 7th Edition  Clinical: Stage IIIA (T2b, N2, M0)  Summary: Stage IIIA (T2b, N2, M0)  CHEMOTHERAPY INTENT: Control/Palliative  CURRENT # OF CHEMOTHERAPY CYCLES: 5 CURRENT ANTIEMETICS: Zofran, dexamethasone, Compazine  CURRENT SMOKING STATUS: Former smoker, quit 01/09/2007  ORAL CHEMOTHERAPY AND CONSENT: n/a  CURRENT BISPHOSPHONATES USE: none  PAIN MANAGEMENT: Ibuprofen  NARCOTICS INDUCED CONSTIPATION: None  LIVING WILL AND CODE STATUS: ?  INTERVAL HISTORY: Steve [name] 74 y.o. [name] returns to the clinic today for followup visit accompanied by his wife. He is currently on treatment with single agent gemcitabine status post 4 cycles and tolerating his treatment fairly well. The patient is having more difficulty with swallowing and pain to his esophagus.  States that the pain is as bad as when he had prior radiation. Using Carafate, Oxycodone, and Prilosec without much relief. He also has occasional episodes where food seems to get stuck mid-esophagus. This improves if he takes several sips of water. Due to see GI for evaluation on 6/29, but does not feel like he can wait that long. He continues to have shortness of breath with exertion with mild cough without hemoptysis. Feels more short of breath lately. Has been seen by Pulmonology and was started on Combivent. PFTs were ordered, but the patient was unable to complete the test due to breathlessness. He is finishing a Prednisone taper as well. He denied having any weight loss or night sweats. The patient has no fever or chills and no nausea or vomiting. He is here today to start cycle #5 of his chemotherapy.  MEDICAL HISTORY: Past Medical History  Diagnosis Date  . Dizziness and giddiness     positional vertigo  . Hyperlipidemia   . Impotence of organic origin   . Osteoarthritis, shoulder   . Allergy   . Hypothyroidism     pt denies thryoid disease, dr pandey note 11-19-12 says subclinical hypothryoid in epic  . COPD (chronic obstructive pulmonary disease)   . Insomnia   . Hx of radiation therapy 02/2013    right lung  . Diabetes mellitus without complication     pt denies diabetes, dr pandey note says dm 11-19-12 epic  . 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 Malignant neoplasm of bladder, part unspecified   . Malignant neoplasm of bronchus and lung, unspecified site     ALLERGIES:  is allergic to morphine and related.  MEDICATIONS:  Current Outpatient Prescriptions  Medication Sig Dispense Refill  . aspirin 81 MG tablet Take 81 mg by mouth daily.    . DULoxetine (CYMBALTA) 60 MG capsule     . gabapentin (NEURONTIN) 300 MG capsule TAKE 1 CAPSULE BY MOUTH THREE TIMES DAILY 270 capsule 0  . Ipratropium-Albuterol (COMBIVENT) 20-100 MCG/ACT AERS respimat Inhale 1 puff  into the lungs every 6 (six) hours. 1 Inhaler 2  . metoprolol tartrate (LOPRESSOR) 25 MG tablet Take 1 tablet (25 mg total) by mouth 2 (two) times daily. 90 tablet 1  . midodrine (PROAMATINE) 2.5 MG tablet Take 2.5 mg by mouth 2 (two) times daily with a meal.     . naproxen sodium (ANAPROX) 220 MG tablet Take 220 mg by mouth 2 (two) times daily with a meal.    . omeprazole (PRILOSEC) 40 MG capsule Take 1 capsule (40 mg total) by mouth daily. 30 capsule 4  . predniSONE (DELTASONE) 10 MG tablet Take 4 tablets x 2 days, 2 tablets x 2 days, 1 tablet x 2 days, 1/2 tablet x 2 days then stop. 15 tablet 0  . simvastatin (ZOCOR) 20 MG tablet Take one tablet by mouth once daily to lower cholesterol 90 tablet 3  . temazepam (RESTORIL) 30 MG capsule Take 1 capsule (30 mg total) by mouth at bedtime as needed for sleep. 30 capsule 0  . flecainide (TAMBOCOR) 150 MG tablet     . lidocaine (XYLOCAINE) 2 % solution Use as directed 20 mLs in the mouth or throat every 6 (six) hours as needed for mouth pain. 100 mL 1   Current Facility-Administered Medications  Medication Dose Route Frequency Provider Last Rate Last Dose  . sodium chloride 0.9 % injection 10 mL  10 mL Intravenous PRN Curt Bears, MD   10 mL at 06/22/14 1142   Facility-Administered Medications Ordered in Other Visits  Medication Dose Route Frequency Provider Last Rate Last Dose  . sodium chloride 0.9 % injection 10 mL  10 mL Intravenous PRN Curt Bears, MD   10 mL at 06/08/14 1216    SURGICAL HISTORY:  Past Surgical History  Procedure Laterality Date  . Cystoscopy w/ dilation of bladder    . Tonsillectomy  age 49  . Endobronchial ultrasound Bilateral 12/15/2012    Procedure: ENDOBRONCHIAL ULTRASOUND;  Surgeon: Brand Males, MD;  Location: [facility];  Service: Cardiopulmonary;  Laterality: Bilateral;  . Colonoscopy  2009    [facility]    REVIEW OF SYSTEMS:  A comprehensive review of systems was negative except for:  Constitutional: positive for anorexia and fatigue Respiratory: positive for dyspnea on exertion and pleurisy/chest pain Gastrointestinal: positive for dyspepsia and dysphagia   PHYSICAL EXAMINATION: General appearance: alert, cooperative and no distress Head: Normocephalic, without obvious abnormality, atraumatic Neck: no adenopathy, no JVD, supple, symmetrical, trachea midline and thyroid not enlarged, symmetric, no tenderness/mass/nodules Lymph nodes: Cervical, supraclavicular, and axillary nodes normal. Resp: wheezes throughout and diminished right posterior base Back: symmetric, no curvature. ROM normal. No CVA tenderness. Cardio: regular rate and rhythm, S1, S2 normal, no murmur, click, rub or gallop GI: soft, non-tender; bowel sounds normal; no masses,  no organomegaly Extremities: extremities normal, atraumatic, no cyanosis or edema Neurologic: Alert and oriented X 3, normal strength and tone. Normal symmetric reflexes. Normal coordination and gait  ECOG PERFORMANCE STATUS: 1 - Symptomatic but completely ambulatory  Blood pressure 111/74, pulse 100, temperature 97.4 F (36.3 C), temperature source Oral, resp.  rate 18, height 6' (1.829 m), weight 176 lb 11.2 oz (80.151 kg), SpO2 100 %.  LABORATORY DATA: Lab Results  Component Value Date   WBC 19.8* 06/22/2014   HGB 12.1* 06/22/2014   HCT 37.3* 06/22/2014   MCV 95.2 06/22/2014   PLT 613* 06/22/2014      Chemistry      Component Value Date/Time   NA 141 06/22/2014 1112   NA 139 11/16/2013 0755   NA 142 04/28/2013 0819   K 4.1 06/22/2014 1112   K 4.1 11/16/2013 0755   CL 100 11/16/2013 0755   CO2 26 06/22/2014 1112   CO2 23 11/16/2013 0755   BUN 20.2 06/22/2014 1112   BUN 12 11/16/2013 0755   BUN 12 04/28/2013 0819   CREATININE 0.8 06/22/2014 1112   CREATININE 0.73 11/16/2013 0755      Component Value Date/Time   CALCIUM 9.9 06/22/2014 1112   CALCIUM 10.0 11/16/2013 0755   ALKPHOS 120 06/22/2014 1112   ALKPHOS  107 04/28/2013 0819   AST 27 06/22/2014 1112   AST 19 04/28/2013 0819   ALT 42 06/22/2014 1112   ALT 12 04/28/2013 0819   BILITOT 0.38 06/22/2014 1112   BILITOT 0.4 04/28/2013 0819       RADIOGRAPHIC STUDIES: Dg Chest 2 View  06/10/2014   CLINICAL DATA:  Shortness of breath, wheezing  EXAM: CHEST  2 VIEW  COMPARISON:  05/14/2014  FINDINGS: Cardiomediastinal silhouette is stable. Right lower lobe retro hilar mass again noted. Right IJ Port-A-Cath with tip in SVC right atrium junction. There is small right pleural effusion with right basilar atelectasis. No pulmonary edema. Left lung is clear. Mild degenerative changes thoracic spine.  IMPRESSION: Again noted right lower lobe central mass. Small right pleural effusion with right basilar atelectasis. Left lung is clear. No pulmonary edema.   Electronically Signed   By: Lahoma Crocker M.D.   On: 06/10/2014 16:31   ASSESSMENT AND PLAN: This is a very pleasant 74 years old Steve [name] with:   1) metastatic non-small cell lung cancer initially diagnosed as stage IIIA non-small cell lung cancer who completed a course of concurrent chemoradiation with weekly carboplatin and paclitaxel with no significant complaints except for radiation induced odynophagia.  He also completed 3 cycles of consolidation chemotherapy with reduced dose carboplatin and paclitaxel. He completed 12 cycles of immunotherapy with Nivolumab and tolerated his treatment fairly well. Unfortunately the recent CT scan of the chest showed further increase in the size of the subcarinal nodal mass consistent with disease progression. The patient was started on systemic chemotherapy with single agent gemcitabine 1000 MG/M2 on days 1 and 8 every 3 weeks. He is status post 4 cycles. The patient is tolerating his chemotherapy fairly well with no significant adverse effects from chemo. The CT angiography study performed on 05/14/2014 was negative for pulmonary embolus and revealed stable disease.    The patient was discussed with and also seen by Dr. Julien Nordmann. Since the patient is having other issues with dyspnea and reflux, we will hold off on chemo today. Will consider restarting after he is evaluated by GI.  For the acid reflux and retrosternal pain, the patient will continue on Prilosec, Carafate as well as pain medication with oxycodone. We have discussed adding viscous lidocaine before meals and he agreed. Prescription sent to pharmacy. GI eval is pending. I called the GI office and was able to get this appointment moved up to 6/20 at 115 PM.  He continue to have  issues with dyspnea. O2 sat was 100% on RA today. He is being followed by Pulmonology. We have again discussed  referral to [facility] to Interventional Pulmonology to see if there is a role for stenting or other intervention. He agrees to this referral. I have notified Medical  Records of referral to Dr. Kerin Ransom.     We will plan to bring him back in about 2 weeks to begin cycle 5 of his chemo.  He was advised to call immediately if he has any concerning symptoms in the interval.  The patient voices understanding of current disease status and treatment options and is in agreement with the current care plan.  All questions were answered. The patient knows to call the clinic with any problems, questions or concerns. We can certainly see the patient much sooner if necessary.  Mikey Bussing, DNP, AGPCNP-BC, AOCNP 06/22/2014  ADDENDUM: Hematology/Oncology Attending: I had a face to face encounter with the patient. I recommended his care plan. This is a very pleasant 74 years old Steve [name] with metastatic non-small cell lung cancer, squamous cell carcinoma who is currently undergoing treatment with systemic chemotherapy with gemcitabine status post 4 cycles. He has been tolerating his treatment well but continues to complain of persistent shortness of breath as well as mid retrosternal chest pain. He is currently on treatment with  Prilosec and Carafate as well as pain medicine with oxycodone with mild improvement. I recommended for the patient treatment with Viscous Lidocaine until he sees gastroenterology for further evaluation. He was also interested in referral to Dr. Kerin Ransom at [facility] for evaluation and to see if there is any intervention that can be done for his obstructing right lower lobe lung mass. We will continue to hold his systemic chemotherapy for now. The patient would come back for follow-up visit in 2 weeks for reevaluation before resuming his systemic chemotherapy. He was advised to call immediately if he has any other concerning symptoms in the interval.  Disclaimer: This note was dictated with voice recognition software. Similar sounding words can inadvertently be transcribed and may not be corrected upon review. Eilleen Kempf., MD 06/23/2014

## 2014-06-23 ENCOUNTER — Telehealth: Payer: Self-pay | Admitting: Internal Medicine

## 2014-06-23 NOTE — Telephone Encounter (Signed)
Flecanide is being used to keep him out of the fast heart rhythm (SVT)  That is what made him dizzy I am afraid if he comes off this he will have more tachycardia.

## 2014-06-23 NOTE — Telephone Encounter (Signed)
Faxed pt medical records to Dr. Adair Patter office. After review of records they will call pt with appt.

## 2014-06-24 ENCOUNTER — Telehealth: Payer: Self-pay | Admitting: *Deleted

## 2014-06-24 NOTE — Telephone Encounter (Signed)
Patient called and stated that he needs his Rx reinstated for his Oxycodone. He told one of his specialist Dr. Parks Ranger he was cutting back on it and they took it off of his medication list completely. Patient needs it due to cancer. Please Advise.

## 2014-06-24 NOTE — Telephone Encounter (Signed)
Informed patient to continue flecainide. Verbalizes understanding/agreement.

## 2014-06-25 ENCOUNTER — Telehealth: Payer: Self-pay | Admitting: Internal Medicine

## 2014-06-25 MED ORDER — OXYCODONE-ACETAMINOPHEN 5-325 MG PO TABS: ORAL_TABLET | ORAL | Status: DC

## 2014-06-25 NOTE — Telephone Encounter (Signed)
Patient Notified and Rx printed for pick up

## 2014-06-25 NOTE — Telephone Encounter (Signed)
It is ok to restart his oxycodone as previously ordered if he is in pain.

## 2014-06-25 NOTE — Telephone Encounter (Signed)
Pt appt to see Dr. Kerin Ransom '@[facility]'$  is 08/03/14'@2'$ :00. Pt is aware.  Medical records faxed.

## 2014-06-28 ENCOUNTER — Encounter: Payer: Self-pay | Admitting: Physician Assistant

## 2014-06-28 ENCOUNTER — Ambulatory Visit (INDEPENDENT_AMBULATORY_CARE_PROVIDER_SITE_OTHER): Payer: Medicare HMO | Admitting: Physician Assistant

## 2014-06-28 VITALS — BP 104/64 | HR 104 | Ht 70.5 in | Wt 174.2 lb

## 2014-06-28 DIAGNOSIS — R1310 Dysphagia, unspecified: Secondary | ICD-10-CM

## 2014-06-28 NOTE — Progress Notes (Signed)
Patient ID: Steve [name], male   DOB: Mar 17, 1940, 74 y.o.   MRN: [id number]    HPI:  Steve [name] is a 74 y.o.   male  referred by [name], MD for evaluation of dysphagia.  [name] has a history of metastatic non-small cell lung cancer initially diagnosed as stage IIIa (T2B, N2, M0) non-small cell lung cancer consistent with poorly differentiated squama cell carcinoma diagnosed in 2014. PRIOR THERAPY:  1) Concurrent chemoradiation with weekly carboplatin for an AUC of 2 and paclitaxel 45 mg/m2, status post 4 cycles. 2) Consolidation chemotherapy with carboplatin for AUC of 5 and paclitaxel 175 mg/M2 every 3 weeks with Neulasta support, first cycle on 03/23/2013. Status post 3 cycles. 3) Immunotherapy with Nivolumab 3 mg/KG every 2 weeks. First dose 09/21/2013. Status post 12 cycles.   CURRENT THERAPY: Systemic chemotherapy with gemcitabine 1000 MG/M2 on days 1 and 8 every 3 weeks. First dose 03/30/2014. Status post 4 cycles.  His past medical history significant for reactive airway, interstitial lung disease, subclinical hypothyroidism, vertigo, type 2 diabetes mellitus with neuropathy, osteoarthritis, impotence, hyperlipidemia, history of bladder cancer, chronic pain disorder, and insomnia. He had a colonoscopy by Dr. Velora Heckler in May 2005 that showed diverticulosis but no polyps.  He is here today with complaints of complaints of dysphagia of several weeks duration. He feels as if all solids get stuck in the proximal esophagus heaping points to the area of the cricoid. He feels they've been passing gets stuck in the distal esophagus and he points to the area of the xiphoid. More recently he has been having dysphagia to liquids as well. He does not feel the food goes down the wrong pipe and he does not have trouble with sputtering. He has been getting a lot of heartburn and has been using omeprazole with little relief, but he has been taking it in the evening. He denies nocturnal  regurgitation or cough. He denies epigastric pain, nausea, or vomiting. He has been losing weight but attributes this to his lung cancer. He states his appetite has been somewhat diminished because nothing tastes good.   Past Medical History  Diagnosis Date  . Dizziness and giddiness     positional vertigo  . Hyperlipidemia   . Impotence of organic origin   . Osteoarthritis, shoulder   . Allergy   . Hypothyroidism     pt denies thryoid disease, dr pandey note 11-19-12 says subclinical hypothryoid in epic  . COPD (chronic obstructive pulmonary disease)     pt denies  . Insomnia   . Hx of radiation therapy 02/2013    right lung  . Diabetes mellitus without complication     pt denies diabetes, dr pandey note says dm 11-19-12 epic  . 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 Malignant neoplasm of bladder, part unspecified   . Malignant neoplasm of bronchus and lung, unspecified site     Past Surgical History  Procedure Laterality Date  . Cystoscopy w/ dilation of bladder    . Tonsillectomy  age 8  . Endobronchial ultrasound Bilateral 12/15/2012    Procedure: ENDOBRONCHIAL ULTRASOUND;  Surgeon: Brand Males, MD;  Location: [facility];  Service: Cardiopulmonary;  Laterality: Bilateral;  . Colonoscopy  2009    [facility]   Family History  Problem Relation Age of Onset  . Kidney disease Brother   . Hypertension Mother   . Diabetes Mother   . Cancer Father     type  unknown  . Stroke Father    History  Substance Use Topics  . Smoking status: Former Smoker -- 1.50 packs/day for 50 years    Types: Cigarettes    Quit date: 01/09/2007  . Smokeless tobacco: Former Systems developer    Types: Chew    Quit date: 05/28/2014  . Alcohol Use: No   Current Outpatient Prescriptions  Medication Sig Dispense Refill  . aspirin 81 MG tablet Take 81 mg by mouth daily.    . DULoxetine (CYMBALTA) 60 MG capsule     . flecainide (TAMBOCOR) 150 MG tablet     . gabapentin (NEURONTIN)  300 MG capsule TAKE 1 CAPSULE BY MOUTH THREE TIMES DAILY 270 capsule 0  . Ipratropium-Albuterol (COMBIVENT) 20-100 MCG/ACT AERS respimat Inhale 1 puff into the lungs every 6 (six) hours. 1 Inhaler 2  . lidocaine (XYLOCAINE) 2 % solution Use as directed 20 mLs in the mouth or throat every 6 (six) hours as needed for mouth pain. 480 mL 1  . metoprolol tartrate (LOPRESSOR) 25 MG tablet Take 1 tablet (25 mg total) by mouth 2 (two) times daily. 90 tablet 1  . midodrine (PROAMATINE) 2.5 MG tablet Take 2.5 mg by mouth 2 (two) times daily with a meal.     . naproxen sodium (ANAPROX) 220 MG tablet Take 220 mg by mouth 2 (two) times daily with a meal.    . omeprazole (PRILOSEC) 40 MG capsule Take 1 capsule (40 mg total) by mouth daily. 30 capsule 4  . oxyCODONE-acetaminophen (ROXICET) 5-325 MG per tablet Take one tablet by mouth every 6 hours as needed for pain 120 tablet 0  . predniSONE (DELTASONE) 10 MG tablet Take 4 tablets x 2 days, 2 tablets x 2 days, 1 tablet x 2 days, 1/2 tablet x 2 days then stop. 15 tablet 0  . simvastatin (ZOCOR) 20 MG tablet Take one tablet by mouth once daily to lower cholesterol 90 tablet 3  . temazepam (RESTORIL) 30 MG capsule Take 1 capsule (30 mg total) by mouth at bedtime as needed for sleep. 30 capsule 0   No current facility-administered medications for this visit.   Facility-Administered Medications Ordered in Other Visits  Medication Dose Route Frequency Provider Last Rate Last Dose  . sodium chloride 0.9 % injection 10 mL  10 mL Intravenous PRN Curt Bears, MD   10 mL at 06/08/14 1216   Allergies  Allergen Reactions  . Morphine And Related Hives     Review of Systems: Gen: Positive for anorexia and fatigue CV: Denies chest pain, angina, palpitations, syncope, orthopnea, PND, peripheral edema, and claudication. Resp: Admits dyspnea on exertion GI: Has dysphagia to solids GU : Denies urinary burning, blood in urine, urinary frequency, urinary hesitancy,  nocturnal urination, and urinary incontinence. MS: Denies joint pain, limitation of movement, and swelling, stiffness, low back pain, extremity pain. Denies muscle weakness, cramps, atrophy.  Derm: Denies rash, itching, dry skin, hives, moles, warts, or unhealing ulcers.  Psych: Denies depression, anxiety, memory loss, suicidal ideation, hallucinations, paranoia, and confusion. Heme: Denies bruising, bleeding, and enlarged lymph nodes. Neuro:  Denies any headaches, dizziness, paresthesias. Endo:  Denies any problems with DM, thyroid, adrenal function  Studies: Dg Chest 2 View  06/10/2014   CLINICAL DATA:  Shortness of breath, wheezing  EXAM: CHEST  2 VIEW  COMPARISON:  05/14/2014  FINDINGS: Cardiomediastinal silhouette is stable. Right lower lobe retro hilar mass again noted. Right IJ Port-A-Cath with tip in SVC right atrium junction. There is small right  pleural effusion with right basilar atelectasis. No pulmonary edema. Left lung is clear. Mild degenerative changes thoracic spine.  IMPRESSION: Again noted right lower lobe central mass. Small right pleural effusion with right basilar atelectasis. Left lung is clear. No pulmonary edema.   Electronically Signed   By: Lahoma Crocker M.D.   On: 06/10/2014 16:31   CT Angio Chest PE W/Cm &/Or Wo Cm     Study Result     CLINICAL DATA: rt lung ca '14 w/ no surgery, chemo only,, now w/ lt t cp w/ productive cough and sob, sob, ? pe or pneumonia, ex-smoker x 7 yrs  EXAM: CT ANGIOGRAPHY CHEST WITH CONTRAST  TECHNIQUE: Multidetector CT imaging of the chest was performed using the standard protocol during bolus administration of intravenous contrast. Multiplanar CT image reconstructions and MIPs were obtained to evaluate the vascular anatomy.  CONTRAST: 80 mL Isovue 370 IV  COMPARISON: 03/19/2014  FINDINGS: Right IJ port catheter to the cavoatrial junction. Left arm contrast injection for CTA. The SVC is patent. Satisfactory  opacification of pulmonary arteries noted, and there is no evidence of pulmonary emboli. 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 Remainder pulmonary veins remain patent. Moderate coronary calcifications. Adequate contrast opacification of the thoracic aorta with no evidence of dissection, aneurysm, or stenosis. There is classic 3-vessel brachiocephalic arch anatomy without proximal stenosis. Patchy aortic arch and descending segment calcifications.  Some increase in moderate right pleural effusion. Trace pericardial effusion now evident. Stable anterior mediastinal, right paratracheal, precarinal and prevascular sub cm lymph nodes. Bulky subcarinal adenopathy.  Consolidative mass in the right lower lobe with encasement and narrowing of proximal right lower lobe segmental bronchi and extensive atelectasis throughout much of the right lower lobe. Left lung remains clear.  Flowing osteophytes across multiple contiguous levels in the mid and lower thoracic spine. Sternum intact.  Visualized portions of upper abdomen unremarkable.  Review of the MIP images confirms the above findings.  IMPRESSION: 1. Negative for acute PE or thoracic aortic dissection. 2. Central right lower lobe mass with encasement of right lower lobe artery and inferior pulmonary vein as well as central airway narrowing and peripheral atelectasis/consolidation as before. 3. Persistent bulky subcarinal adenopathy. 4. Slight increase in moderate right pleural effusion   Electronically Signed  By: Lucrezia Europe M.D.  On: 05/14/2014 15:09   Contrast   Status: Final result       PACS Images     Show images for CT Chest W Contrast     Study Result     CLINICAL DATA: Right lung cancer diagnosed in 2014. Oral chemotherapy ongoing. Sinus problems. Subsequent encounter.  EXAM: CT CHEST WITH  CONTRAST  TECHNIQUE: Multidetector CT imaging of the chest was performed during intravenous contrast administration.  CONTRAST: 64m OMNIPAQUE IOHEXOL 300 MG/ML SOLN  COMPARISON: Radiographs 02/11/2014. CT 01/22/2014.  FINDINGS: Mediastinum/Nodes: There is progressive enlargement of the subcarinal nodal mass, measuring 3.8 x 2.7 cm on image 32. This previously measured 2.7 x 2.0 cm. No other enlarged mediastinal, hilar or axillary lymph nodes identified. The thyroid gland, trachea and esophagus demonstrate no significant findings. The heart size is normal. There is no pericardial effusion.There is stable atherosclerosis of the aorta, great vessels and coronary arteries. Right IJ Port-A-Cath tip at the SVC right atrial junction.  Lungs/Pleura: Moderate-sized dependent right pleural effusion appears stable without pleural nodularity. There is persistent occlusion of the bronchus intermedius and irregular central narrowing of the  right upper lobe bronchus. There is collapse of the right lower lobe with concern of a persistent right infrahilar mass, obscured by adjacent atelectasis. Paramediastinal radiation changes are present within the right lung. The patchy ground-glass opacities noted previously have generally improved. There are some fluctuating components in the left upper and lower lobes. No suspicious peripheral pulmonary nodules identified.  Upper abdomen: 8 mm hypervascular liver lesion in the right lobe on image 57 is similar to prior studies, likely an incidental vascular lesion. No suspicious hepatic or adrenal findings.  Musculoskeletal/Chest wall: There is no chest wall mass or suspicious osseous finding. Stable paraspinal osteophytes throughout the thoracic spine.  IMPRESSION: 1. Further enlargement in subcarinal nodal mass consistent with progressive metastatic disease. 2. Persistent concern of right infrahilar mass causing lower lobe obstruction.  This mass remains largely obscured by the atelectatic lung and is not well measured. 3. Overall improvement in patchy ground-glass opacities noted in both lungs compared with prior study, consistent with resolving inflammatory process. 4. Stable moderate size right pleural effusion.   Electronically Signed  By: Richardean Sale M.D.  On: 03/19/2014 16:28     LAB RESULTS: CBC on 06/22/2014 [place] blood count 19.8, hemoglobin 12.1, hematocrit 37.3, platelets 613,000.  Prior Endoscopies:  See history of present illness  Physical Exam: BP 104/64 mmHg  Pulse 104  Ht 5' 10.5" (1.791 m)  Wt 174 lb 4 oz (79.039 kg)  BMI 24.64 kg/m2 Constitutional: Pleasant, elderly, [place] male in no acute distress. HEENT: Normocephalic and atraumatic. Conjunctivae are normal. No scleral icterus. Neck supple. No JVD Cardiovascular: Normal rate, regular rhythm.  Pulmonary/chest: Diminished breath sounds right base, scattered wheezes throughout other fields bilaterally Abdominal: Soft, nondistended, nontender. Bowel sounds active throughout. There are no masses palpable. No hepatomegaly. Extremities: no edema Lymphadenopathy: No cervical adenopathy noted. Neurological: Alert and oriented to person place and time. Skin: Skin is warm and dry. No rashes noted. Psychiatric: Normal mood and affect. Behavior is normal.  ASSESSMENT AND PLAN: 74 year old male with metastatic non-small cell lung cancer who completed a course of concurrent chemoradiation now with dysphagia. He has also been experiencing significant heartburn. He has been instructed to use his omeprazole 40 mg by mouth every morning 30 minutes prior to breakfast. He will be scheduled for a barium swallow with tablet to evaluate for possible stricture, dysmotility, or esophageal wall thickening. He will then be scheduled for an EGD with possible dilation.The risks, benefits, and alternatives to endoscopy with possible biopsy and possible dilation  were discussed with the patient and they consent to proceed.  The procedure will be scheduled with Dr. Fuller Plan. Further recommendations will be made pending the findings of the above.    Anoushka Divito, Deloris Ping 06/28/2014, 4:34 PM  CC: [name], MD

## 2014-06-28 NOTE — Patient Instructions (Addendum)
You are scheduled at [facility] for Barium Swallow 06/29/2014 at 11:30 am.  Nothing to eat or drink 3 hours prior.  Please arrive 15 minutes early.  You have been scheduled for an endoscopy. Please follow written instructions given to you at your visit today. If you use inhalers (even only as needed), please bring them with you on the day of your procedure.  Use you omeprazole '40mg'$  every morning 30 minutes prior to breakfast.  Food Choices for Gastroesophageal Reflux Disease When you have gastroesophageal reflux disease (GERD), the foods you eat and your eating habits are very important. Choosing the right foods can help ease the discomfort of GERD. WHAT GENERAL GUIDELINES DO I NEED TO FOLLOW?  Choose fruits, vegetables, whole grains, low-fat dairy products, and low-fat meat, fish, and poultry.  Limit fats such as oils, salad dressings, butter, nuts, and avocado.  Keep a food diary to identify foods that cause symptoms.  Avoid foods that cause reflux. These may be different for different people.  Eat frequent small meals instead of three large meals each day.  Eat your meals slowly, in a relaxed setting.  Limit fried foods.  Cook foods using methods other than frying.  Avoid drinking alcohol.  Avoid drinking large amounts of liquids with your meals.  Avoid bending over or lying down until 2-3 hours after eating. WHAT FOODS ARE NOT RECOMMENDED? The following are some foods and drinks that may worsen your symptoms: Vegetables Tomatoes. Tomato juice. Tomato and spaghetti sauce. Chili peppers. Onion and garlic. Horseradish. Fruits Oranges, grapefruit, and lemon (fruit and juice). Meats High-fat meats, fish, and poultry. This includes hot dogs, ribs, ham, sausage, salami, and bacon. Dairy Whole milk and chocolate milk. Sour cream. Cream. Butter. Ice cream. Cream cheese.  Beverages Coffee and tea, with or without caffeine. Carbonated beverages or energy  drinks. Condiments Hot sauce. Barbecue sauce.  Sweets/Desserts Chocolate and cocoa. Donuts. Peppermint and spearmint. Fats and Oils High-fat foods, including [place] fries and potato chips. Other Vinegar. Strong spices, such as black pepper, [place] pepper, red pepper, cayenne, curry powder, cloves, ginger, and chili powder. The items listed above may not be a complete list of foods and beverages to avoid. Contact your dietitian for more information. Document Released: 12/25/2004 Document Revised: 12/30/2012 Document Reviewed: 10/29/2012 [facility] Patient Information 2015 [place], [place]. This information is not intended to replace advice given to you by your health care provider. Make sure you discuss any questions you have with your health care provider.

## 2014-06-29 ENCOUNTER — Ambulatory Visit: Payer: Medicare HMO

## 2014-06-29 ENCOUNTER — Ambulatory Visit (HOSPITAL_COMMUNITY)
Admission: RE | Admit: 2014-06-29 | Discharge: 2014-06-29 | Disposition: A | Discharge status: Home / Self Care | Payer: Medicare HMO | Source: Ambulatory Visit | Attending: Physician Assistant | Admitting: Physician Assistant

## 2014-06-29 DIAGNOSIS — K222 Esophageal obstruction: Secondary | ICD-10-CM | POA: Insufficient documentation

## 2014-06-29 DIAGNOSIS — R1310 Dysphagia, unspecified: Secondary | ICD-10-CM | POA: Diagnosis present

## 2014-06-29 DIAGNOSIS — R590 Localized enlarged lymph nodes: Secondary | ICD-10-CM | POA: Insufficient documentation

## 2014-06-30 NOTE — Progress Notes (Signed)
Reviewed and agree with management plan.  Remmy Riffe T. Aubry Rankin, MD FACG 

## 2014-07-01 ENCOUNTER — Ambulatory Visit (AMBULATORY_SURGERY_CENTER): Payer: Medicare HMO | Admitting: Gastroenterology

## 2014-07-01 ENCOUNTER — Encounter: Payer: Self-pay | Admitting: Gastroenterology

## 2014-07-01 VITALS — BP 98/71 | HR 81 | Temp 97.7°F | Resp 17 | Ht 70.0 in | Wt 174.0 lb

## 2014-07-01 DIAGNOSIS — R1314 Dysphagia, pharyngoesophageal phase: Secondary | ICD-10-CM | POA: Diagnosis not present

## 2014-07-01 DIAGNOSIS — R933 Abnormal findings on diagnostic imaging of other parts of digestive tract: Secondary | ICD-10-CM

## 2014-07-01 DIAGNOSIS — R1310 Dysphagia, unspecified: Secondary | ICD-10-CM | POA: Diagnosis present

## 2014-07-01 DIAGNOSIS — R079 Chest pain, unspecified: Secondary | ICD-10-CM

## 2014-07-01 DIAGNOSIS — K2210 Ulcer of esophagus without bleeding: Secondary | ICD-10-CM | POA: Diagnosis not present

## 2014-07-01 MED ORDER — SODIUM CHLORIDE 0.9 % IV SOLN: 500.0000 mL | INTRAVENOUS | Status: DC

## 2014-07-01 NOTE — Op Note (Signed)
[place]  [address] [place], [id number]   ENDOSCOPY PROCEDURE REPORT PATIENT: Steve [name], Steve [name]  MR#: [id number] BIRTHDATE: Jan 08, 1941 , 74  yrs. old GENDER: male ENDOSCOPIST: Ladene Artist, MD, [facility] REFERRED BY:  Jeanmarie Hubert, M.D. PROCEDURE DATE:  07/01/2014 PROCEDURE:  EGD w/ biopsy ASA CLASS:     Class III INDICATIONS:  dysphagia, abnormal barium esophagogram, and chest pain. MEDICATIONS: Monitored anesthesia care and Propofol 150 mg IV TOPICAL ANESTHETIC: none DESCRIPTION OF PROCEDURE: After the risks benefits and alternatives of the procedure were thoroughly explained, informed consent was obtained.  The LB [id number] endoscope was introduced through the mouth and advanced to the stomach body , Limited by a stricture.  The instrument was slowly withdrawn as the mucosa was fully examined.  ESOPHAGUS: There was a severe stricture with associated erosions and exudates in the mid esophagus. Stricture from 28-32 cm. Stricture appeared extrinsic. The stricture was traversable with resistance. Multiple biopsies were performed.  The esophagus otherwise appeared normal. STOMACH: The mucosa of the gastric body appeared normal. Retroflexion was not performed.     The scope was then withdrawn from the patient and the procedure completed. COMPLICATIONS: There were no immediate complications.  ENDOSCOPIC IMPRESSION: 1.    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    Limited exam due to the stricture  RECOMMENDATIONS: 1.  Anti-reflux regimen 2.  Full liquids diet, no solid foods until dysphagia improves 3.  Avoid esophageal irritants: DC ASA/NSAIDs 4.  May need to change to liquids medications and small pills per his PCP and Oncologist 5.  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  [R eSigned:  Ladene Artist, MD, [facility] 07/01/2014 3:35 PM   PO:[id number] Julien Nordmann, MD  PATIENT NAME:  Elster, Corbello MR#: [id number]

## 2014-07-01 NOTE — Patient Instructions (Signed)
Discharge instructions given. Full liquid diet,no solid food until dysphagia improves. Avoid  Esophageal irritants-DC ASA/NSAIDS. May to change to liquids medications. Resume previous medications. YOU HAD AN ENDOSCOPIC PROCEDURE TODAY AT [facility]:   Refer to the procedure report that was given to you for any specific questions about what was found during the examination.  If the procedure report does not answer your questions, please call your gastroenterologist to clarify.  If you requested that your care partner not be given the details of your procedure findings, then the procedure report has been included in a sealed envelope for you to review at your convenience later.  YOU SHOULD EXPECT: Some feelings of bloating in the abdomen. Passage of more gas than usual.  Walking can help get rid of the air that was put into your GI tract during the procedure and reduce the bloating. If you had a lower endoscopy (such as a colonoscopy or flexible sigmoidoscopy) you may notice spotting of blood in your stool or on the toilet paper. If you underwent a bowel prep for your procedure, you may not have a normal bowel movement for a few days.  Please Note:  You might notice some irritation and congestion in your nose or some drainage.  This is from the oxygen used during your procedure.  There is no need for concern and it should clear up in a day or so.  SYMPTOMS TO REPORT IMMEDIATELY:    Following upper endoscopy (EGD)  Vomiting of blood or coffee ground material  New chest pain or pain under the shoulder blades  Painful or persistently difficult swallowing  New shortness of breath  Fever of 100F or higher  Black, tarry-looking stools  For urgent or emergent issues, a gastroenterologist can be reached at any hour by calling [phone].   DIET: Your first meal following the procedure should be a small meal and then it is ok to progress to your normal diet. Heavy or fried foods  are harder to digest and may make you feel nauseous or bloated.  Likewise, meals heavy in dairy and vegetables can increase bloating.  Drink plenty of fluids but you should avoid alcoholic beverages for 24 hours.  ACTIVITY:  You should plan to take it easy for the rest of today and you should NOT DRIVE or use heavy machinery until tomorrow (because of the sedation medicines used during the test).    FOLLOW UP: Our staff will call the number listed on your records the next business day following your procedure to check on you and address any questions or concerns that you may have regarding the information given to you following your procedure. If we do not reach you, we will leave a message.  However, if you are feeling well and you are not experiencing any problems, there is no need to return our call.  We will assume that you have returned to your regular daily activities without incident.  If any biopsies were taken you will be contacted by phone or by letter within the next 1-3 weeks.  Please call us at [phone] if you have not heard about the biopsies in 3 weeks.    SIGNATURES/CONFIDENTIALITY: You and/or your care partner have signed paperwork which will be entered into your electronic medical record.  These signatures attest to the fact that that the information above on your After Visit Summary has been reviewed and is understood.  Full responsibility of the confidentiality of this discharge information lies  with you and/or your care-partner.

## 2014-07-01 NOTE — Progress Notes (Signed)
A/ox3 pleased with MAC, report to Celia RN 

## 2014-07-01 NOTE — Progress Notes (Signed)
Called to room to assist during endoscopic procedure.  Patient ID and intended procedure confirmed with present staff. Received instructions for my participation in the procedure from the performing physician.  

## 2014-07-02 ENCOUNTER — Other Ambulatory Visit: Payer: Self-pay | Admitting: *Deleted

## 2014-07-02 ENCOUNTER — Telehealth: Payer: Self-pay | Admitting: *Deleted

## 2014-07-02 DIAGNOSIS — G4700 Insomnia, unspecified: Secondary | ICD-10-CM

## 2014-07-02 MED ORDER — TEMAZEPAM 30 MG PO CAPS: 30.0000 mg | ORAL_CAPSULE | Freq: Every evening | ORAL | Status: DC | PRN

## 2014-07-02 NOTE — Telephone Encounter (Signed)
  Follow up Call-  Call back number 07/01/2014  Post procedure Call Back phone  # [phone]  Permission to leave phone message Yes     Patient questions:  Do you have a fever, pain , or abdominal swelling? No. Pain Score  0 *  Have you tolerated food without any problems? Yes.    Have you been able to return to your normal activities? Yes.    Do you have any questions about your discharge instructions: Diet   No. Medications  Yes.   Follow up visit  No.  Do you have questions or concerns about your Care? No.  Actions: * If pain score is 4 or above: No action needed, pain <4.  Spoke with spouse(pt sleeping).  She asked if he was to continue his omeprazole, advised (per recommendations) to continue.  Also to avoid ASA and NSAIDS  Encouraged to have Mr. Avants call office back if any more questions.

## 2014-07-02 NOTE — Telephone Encounter (Signed)
Patient called wanting refill. Called into pharmacy.

## 2014-07-05 ENCOUNTER — Other Ambulatory Visit: Payer: Self-pay

## 2014-07-06 ENCOUNTER — Encounter: Payer: Self-pay | Admitting: Physician Assistant

## 2014-07-06 ENCOUNTER — Ambulatory Visit: Payer: Medicare HMO

## 2014-07-06 ENCOUNTER — Telehealth: Payer: Self-pay | Admitting: Internal Medicine

## 2014-07-06 ENCOUNTER — Other Ambulatory Visit (HOSPITAL_BASED_OUTPATIENT_CLINIC_OR_DEPARTMENT_OTHER): Payer: Medicare HMO

## 2014-07-06 ENCOUNTER — Ambulatory Visit (HOSPITAL_BASED_OUTPATIENT_CLINIC_OR_DEPARTMENT_OTHER): Payer: Medicare HMO | Admitting: Physician Assistant

## 2014-07-06 ENCOUNTER — Ambulatory Visit: Payer: Medicare HMO | Admitting: Physician Assistant

## 2014-07-06 ENCOUNTER — Encounter: Payer: Self-pay | Admitting: Gastroenterology

## 2014-07-06 ENCOUNTER — Other Ambulatory Visit: Payer: Medicare HMO

## 2014-07-06 VITALS — BP 120/80 | HR 118 | Temp 97.4°F | Wt 171.3 lb

## 2014-07-06 DIAGNOSIS — C3431 Malignant neoplasm of lower lobe, right bronchus or lung: Secondary | ICD-10-CM

## 2014-07-06 DIAGNOSIS — Z79899 Other long term (current) drug therapy: Secondary | ICD-10-CM

## 2014-07-06 DIAGNOSIS — Z95828 Presence of other vascular implants and grafts: Secondary | ICD-10-CM

## 2014-07-06 DIAGNOSIS — E039 Hypothyroidism, unspecified: Secondary | ICD-10-CM

## 2014-07-06 DIAGNOSIS — Z452 Encounter for adjustment and management of vascular access device: Secondary | ICD-10-CM | POA: Diagnosis not present

## 2014-07-06 LAB — CBC WITH DIFFERENTIAL/PLATELET
BASO%: 0.7 % (ref 0.0–2.0)
Basophils Absolute: 0.1 10*3/uL (ref 0.0–0.1)
EOS%: 0.4 % (ref 0.0–7.0)
Eosinophils Absolute: 0.1 10*3/uL (ref 0.0–0.5)
HCT: 38.6 % (ref 38.4–49.9)
HEMOGLOBIN: 12.5 g/dL — AB (ref 13.0–17.1)
LYMPH%: 5.3 % — AB (ref 14.0–49.0)
MCH: 30.5 pg (ref 27.2–33.4)
MCHC: 32.5 g/dL (ref 32.0–36.0)
MCV: 93.7 fL (ref 79.3–98.0)
MONO#: 1.3 10*3/uL — ABNORMAL HIGH (ref 0.1–0.9)
MONO%: 7.9 % (ref 0.0–14.0)
NEUT%: 85.7 % — ABNORMAL HIGH (ref 39.0–75.0)
NEUTROS ABS: 13.6 10*3/uL — AB (ref 1.5–6.5)
PLATELETS: 552 10*3/uL — AB (ref 140–400)
RBC: 4.12 10*6/uL — ABNORMAL LOW (ref 4.20–5.82)
RDW: 20.4 % — ABNORMAL HIGH (ref 11.0–14.6)
WBC: 15.9 10*3/uL — ABNORMAL HIGH (ref 4.0–10.3)
lymph#: 0.8 10*3/uL — ABNORMAL LOW (ref 0.9–3.3)

## 2014-07-06 LAB — COMPREHENSIVE METABOLIC PANEL (CC13)
ALT: 13 U/L (ref 0–55)
ANION GAP: 9 meq/L (ref 3–11)
AST: 15 U/L (ref 5–34)
Albumin: 2.9 g/dL — ABNORMAL LOW (ref 3.5–5.0)
Alkaline Phosphatase: 86 U/L (ref 40–150)
BUN: 18.4 mg/dL (ref 7.0–26.0)
CALCIUM: 9.2 mg/dL (ref 8.4–10.4)
CO2: 25 mEq/L (ref 22–29)
CREATININE: 0.8 mg/dL (ref 0.7–1.3)
Chloride: 103 mEq/L (ref 98–109)
EGFR: 87 mL/min/{1.73_m2} — ABNORMAL LOW (ref >90)
Glucose: 185 mg/dl — ABNORMAL HIGH (ref 70–140)
POTASSIUM: 4.3 meq/L (ref 3.5–5.1)
Sodium: 137 mEq/L (ref 136–145)
Total Bilirubin: 0.5 mg/dL (ref 0.20–1.20)
Total Protein: 7.2 g/dL (ref 6.4–8.3)

## 2014-07-06 LAB — TSH CHCC: TSH: 2.682 m[IU]/L (ref 0.320–4.118)

## 2014-07-06 MED ORDER — SODIUM CHLORIDE 0.9 % IJ SOLN
10.0000 mL | INTRAMUSCULAR | Status: DC | PRN
Start: 2014-07-06 — End: 2014-07-10
  Administered 2014-07-06: 10 mL via INTRAVENOUS
  Filled 2014-07-06: qty 10

## 2014-07-06 MED ORDER — HEPARIN SOD (PORK) LOCK FLUSH 100 UNIT/ML IV SOLN
500.0000 [IU] | Freq: Once | INTRAVENOUS | Status: AC
  Administered 2014-07-06: 500 [IU] via INTRAVENOUS
  Filled 2014-07-06: qty 5

## 2014-07-06 MED ORDER — METHYLPREDNISOLONE 4 MG PO TBPK: ORAL_TABLET | ORAL | Status: DC

## 2014-07-06 MED ORDER — SODIUM CHLORIDE 0.9 % IJ SOLN
10.0000 mL | INTRAMUSCULAR | Status: DC | PRN
  Administered 2014-07-06: 10 mL via INTRAVENOUS
  Filled 2014-07-06: qty 10

## 2014-07-06 NOTE — Progress Notes (Addendum)
[place] [phone]   [phone]  OFFICE PROGRESS NOTE  GREEN, Viviann Spare, MD 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   Primary site: Lung (Right)  Staging method: AJCC 7th Edition  Clinical: Stage IIIA (T2b, N2, M0)  Summary: Stage IIIA (T2b, N2, M0)   PRIOR THERAPY:  1) Concurrent chemoradiation with weekly carboplatin for an AUC of 2 and paclitaxel 45 mg/m2, status post 4 cycles. 2) Consolidation chemotherapy with carboplatin for AUC of 5 and paclitaxel 175 mg/M2 every 3 weeks with Neulasta support, first cycle on 03/23/2013. Status post 3 cycles. 3) Immunotherapy with Nivolumab 3 mg/KG every 2 weeks. First dose 09/21/2013. Status post 12 cycles.   CURRENT THERAPY: Systemic chemotherapy with gemcitabine 1000 MG/M2 on days 1 and 8 every 3 weeks. First dose 03/30/2014. Status post 4 cycles.  DISEASE STAGE:  Lung cancer  Primary site: Lung (Right)  Staging method: AJCC 7th Edition  Clinical: Stage IIIA (T2b, N2, M0)  Summary: Stage IIIA (T2b, N2, M0)  CHEMOTHERAPY INTENT: Control/Palliative  CURRENT # OF CHEMOTHERAPY CYCLES: 4 CURRENT ANTIEMETICS: Zofran, dexamethasone, Compazine  CURRENT SMOKING STATUS: Former smoker, quit 01/09/2007  ORAL CHEMOTHERAPY AND CONSENT: n/a  CURRENT BISPHOSPHONATES USE: none  PAIN MANAGEMENT: Ibuprofen  NARCOTICS INDUCED CONSTIPATION: None  LIVING WILL AND CODE STATUS: ?  INTERVAL HISTORY: Steve [name] 74 y.o. male returns to the clinic today for followup visit accompanied by his wife. He is currently on treatment with single agent gemcitabine status post 4 cycles and tolerating his treatment fairly well. The patient is having more difficulty with swallowing and pain to his esophagus. He  was evaluated by gastroenterology and found to have erosive esophagitis and pathology was negative for malignancy. His primary complaint continues to be that of breathlessness/increase shortness breath with minimal activity. He has been evaluated by Dr. Chase Caller and placed on Combivent. He was unable to complete his pulmonary function studies due to his breathlessness. Patient states that he was told that he does not have COPD. He reports continued mild cough without hemoptysis. He denied having any weight loss or night sweats. The patient has no fever or chills and no nausea or vomiting. He is here today to start cycle #5 of his chemotherapy.  MEDICAL HISTORY: Past Medical History  Diagnosis Date  . Dizziness and giddiness     positional vertigo  . Hyperlipidemia   . Impotence of organic origin   . Osteoarthritis, shoulder   . Allergy   . Hypothyroidism     pt denies thryoid disease, dr pandey note 11-19-12 says subclinical hypothryoid in epic  . COPD (chronic obstructive pulmonary disease)     pt denies  . Insomnia   . Hx of radiation therapy 02/2013    right lung  . Diabetes mellitus without complication     pt denies diabetes, dr pandey note says dm 11-19-12 epic  . 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 Malignant neoplasm of bladder, part unspecified   . Malignant neoplasm of bronchus and lung, unspecified site     ALLERGIES:  is allergic to morphine and related.  MEDICATIONS:  Current Outpatient Prescriptions  Medication Sig Dispense Refill  . aspirin 81 MG tablet Take 81 mg by mouth daily.    Marland Kitchen  DULoxetine (CYMBALTA) 60 MG capsule     . flecainide (TAMBOCOR) 150 MG tablet     . gabapentin (NEURONTIN) 300 MG capsule TAKE 1 CAPSULE BY MOUTH THREE TIMES DAILY 270 capsule 0  . Ipratropium-Albuterol (COMBIVENT) 20-100 MCG/ACT AERS respimat Inhale 1 puff into the lungs every 6 (six) hours. 1 Inhaler 2  . lidocaine (XYLOCAINE) 2 % solution Use as directed 20 mLs in  the mouth or throat every 6 (six) hours as needed for mouth pain. 480 mL 1  . metoprolol tartrate (LOPRESSOR) 25 MG tablet Take 1 tablet (25 mg total) by mouth 2 (two) times daily. 90 tablet 1  . midodrine (PROAMATINE) 2.5 MG tablet Take 2.5 mg by mouth 2 (two) times daily with a meal.     . omeprazole (PRILOSEC) 40 MG capsule Take 1 capsule (40 mg total) by mouth daily. 30 capsule 4  . oxyCODONE-acetaminophen (ROXICET) 5-325 MG per tablet Take one tablet by mouth every 6 hours as needed for pain 120 tablet 0  . predniSONE (DELTASONE) 10 MG tablet Take 4 tablets x 2 days, 2 tablets x 2 days, 1 tablet x 2 days, 1/2 tablet x 2 days then stop. 15 tablet 0  . simvastatin (ZOCOR) 20 MG tablet Take one tablet by mouth once daily to lower cholesterol 90 tablet 3  . temazepam (RESTORIL) 30 MG capsule Take 1 capsule (30 mg total) by mouth at bedtime as needed for sleep. 30 capsule 0  . methylPREDNISolone (MEDROL DOSEPAK) 4 MG TBPK tablet Take as directed with food 21 tablet 0  . naproxen sodium (ANAPROX) 220 MG tablet Take 220 mg by mouth 2 (two) times daily with a meal.     Current Facility-Administered Medications  Medication Dose Route Frequency Provider Last Rate Last Dose  . sodium chloride 0.9 % injection 10 mL  10 mL Intravenous PRN Curt Bears, MD   10 mL at 07/06/14 1503   Facility-Administered Medications Ordered in Other Visits  Medication Dose Route Frequency Provider Last Rate Last Dose  . sodium chloride 0.9 % injection 10 mL  10 mL Intravenous PRN Curt Bears, MD   10 mL at 06/08/14 1216  . sodium chloride 0.9 % injection 10 mL  10 mL Intravenous PRN Heath Lark, MD   10 mL at 07/06/14 1342    SURGICAL HISTORY:  Past Surgical History  Procedure Laterality Date  . Cystoscopy w/ dilation of bladder    . Tonsillectomy  age 18  . Endobronchial ultrasound Bilateral 12/15/2012    Procedure: ENDOBRONCHIAL ULTRASOUND;  Surgeon: Brand Males, MD;  Location: [facility];  Service:  Cardiopulmonary;  Laterality: Bilateral;  . Colonoscopy  2009    [facility]    REVIEW OF SYSTEMS:  A comprehensive review of systems was negative except for: Constitutional: positive for anorexia and fatigue Respiratory: positive for dyspnea on exertion and pleurisy/chest pain Gastrointestinal: positive for dyspepsia and dysphagia   PHYSICAL EXAMINATION: General appearance: alert, cooperative and no distress Head: Normocephalic, without obvious abnormality, atraumatic Neck: no adenopathy, no JVD, supple, symmetrical, trachea midline and thyroid not enlarged, symmetric, no tenderness/mass/nodules Lymph nodes: Cervical, supraclavicular, and axillary nodes normal. Resp: wheezes throughout and diminished right posterior base Back: symmetric, no curvature. ROM normal. No CVA tenderness. Cardio: regular rate and rhythm, S1, S2 normal, no murmur, click, rub or gallop GI: soft, non-tender; bowel sounds normal; no masses,  no organomegaly Extremities: extremities normal, atraumatic, no cyanosis or edema Neurologic: Alert and oriented X 3, normal strength and tone. Normal  symmetric reflexes. Normal coordination and gait  ECOG PERFORMANCE STATUS: 1 - Symptomatic but completely ambulatory  There were no vitals taken for this visit.  LABORATORY DATA: Lab Results  Component Value Date   WBC 15.9* 07/06/2014   HGB 12.5* 07/06/2014   HCT 38.6 07/06/2014   MCV 93.7 07/06/2014   PLT 552* 07/06/2014      Chemistry      Component Value Date/Time   NA 137 07/06/2014 1325   NA 139 11/16/2013 0755   NA 142 04/28/2013 0819   K 4.3 07/06/2014 1325   K 4.1 11/16/2013 0755   CL 100 11/16/2013 0755   CO2 25 07/06/2014 1325   CO2 23 11/16/2013 0755   BUN 18.4 07/06/2014 1325   BUN 12 11/16/2013 0755   BUN 12 04/28/2013 0819   CREATININE 0.8 07/06/2014 1325   CREATININE 0.73 11/16/2013 0755      Component Value Date/Time   CALCIUM 9.2 07/06/2014 1325   CALCIUM 10.0 11/16/2013 0755   ALKPHOS  86 07/06/2014 1325   ALKPHOS 107 04/28/2013 0819   AST 15 07/06/2014 1325   AST 19 04/28/2013 0819   ALT 13 07/06/2014 1325   ALT 12 04/28/2013 0819   BILITOT 0.50 07/06/2014 1325   BILITOT 0.4 04/28/2013 0819       RADIOGRAPHIC STUDIES: Dg Chest 2 View  06/10/2014   CLINICAL DATA:  Shortness of breath, wheezing  EXAM: CHEST  2 VIEW  COMPARISON:  05/14/2014  FINDINGS: Cardiomediastinal silhouette is stable. Right lower lobe retro hilar mass again noted. Right IJ Port-A-Cath with tip in SVC right atrium junction. There is small right pleural effusion with right basilar atelectasis. No pulmonary edema. Left lung is clear. Mild degenerative changes thoracic spine.  IMPRESSION: Again noted right lower lobe central mass. Small right pleural effusion with right basilar atelectasis. Left lung is clear. No pulmonary edema.   Electronically Signed   By: Lahoma Crocker M.D.   On: 06/10/2014 16:31   Dg Esophagus  06/29/2014   CLINICAL DATA:  Dysphagia and odynophagia. Choking and coughing. Solid food and liquids sticking in throat and chest. Previous radiation therapy and chemotherapy for lung carcinoma.  EXAM: ESOPHOGRAM / BARIUM SWALLOW / BARIUM TABLET STUDY  TECHNIQUE: Combined double contrast and single contrast examination performed using effervescent crystals, thick barium liquid, and thin barium liquid. The patient was observed with fluoroscopy swallowing a 13 mm barium sulphate tablet.  FLUOROSCOPY TIME:  Fluoroscopy Time:  2 minutes 1 second  Number of Acquired Images:  11  COMPARISON:  Recent chest CT on 05/14/2014  FINDINGS: No evidence of vestibular penetration or aspiration during swallowing. The pharynx has a normal appearance.  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 This corresponds with subcarinal mediastinal lymphadenopathy seen on recent chest CT.  No definite findings of esophagitis noted. Esophageal motility is within normal limits. No evidence of hiatal hernia  or gastroesophgeal reflux. An ingested 50m barium tablet passed freely through the esophagus, and into the stomach.  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 This stricture does not prevent passage of liquid barium or 13 mm barium tablet.   Electronically Signed   By: JEarle GellM.D.   On: 06/29/2014 14:33   ASSESSMENT AND PLAN: This is a very pleasant 74years old [place] male with:   1) metastatic non-small cell lung cancer initially diagnosed as stage  IIIA non-small cell lung cancer who completed a course of concurrent chemoradiation with weekly carboplatin and paclitaxel with no significant complaints except for radiation induced odynophagia.  He also completed 3 cycles of consolidation chemotherapy with reduced dose carboplatin and paclitaxel. He completed 12 cycles of immunotherapy with Nivolumab and tolerated his treatment fairly well. Unfortunately the recent CT scan of the chest showed further increase in the size of the subcarinal nodal mass consistent with disease progression. The patient was started on systemic chemotherapy with single agent gemcitabine 1000 MG/M2 on days 1 and 8 every 3 weeks. He is status post 4 cycles. The patient is tolerating his chemotherapy fairly well. The CT angiography study performed on 05/14/2014 was negative for pulmonary embolus and revealed stable disease.   The patient was discussed with and also seen by Dr. Julien Nordmann. Since the patient is having continued issues with dyspnea, we have recommended that he keep the appointment at [facility] with the pulmonologist for evaluation for possible stent placement to help with his aeration. HEENT his wife currently plan to visit their daughter in [place] from July 17 through 08/02/2014. The evaluation at [facility] is on 08/03/2014. Steve [name] was given the option of proceeding with cycle #5 of his chemotherapy as  scheduled or holding off on chemotherapy at this time. Patient opted to not proceed with treatment as he would like to enjoy his visit with his daughter. We will place his chemotherapy on hold until after his evaluation at [facility] with Interventional Pulmonology, Dr. Kerin Ransom. He will follow-up in approximately 4-6 weeks with a restaging CT scan of the chest, abdomen and pelvis with contrast to reevaluate his disease. For the possibility of perhaps a pneumonitis-type picture, although this didn't not show on the CT angiography that was performed on 05/14/2014, we will place the patient on a Medrol Dosepak. Any further steroids are to come from Dr. Chase Caller or the pulmonologist at [facility].   He was advised to call immediately if he has any concerning symptoms in the interval.  The patient voices understanding of current disease status and treatment options and is in agreement with the current care plan.  All questions were answered. The patient knows to call the clinic with any problems, questions or concerns. We can certainly see the patient much sooner if necessary.  Carlton Adam, PA-C  07/06/2014  ADDENDUM: Hematology/Oncology Attending: I had a face to face encounter with the patient. I recommended his care plan.this is a very pleasant 74 years old [place] male with metastatic non-small cell lung cancer, squamous cell carcinoma was currently undergoing treatment with single agent gemcitabine status post4 cycles. His treatment has been on hold secondary to adverse effect and significant pain in the esophagus as well as shortness of breath. The patient underwent EGD that showed some evidence for erosive esophagitis but was negative for malignancy. The patient has planned to go to [place] next month to visit his daughter. We had a lengthy discussion about his treatment options and with her to resume his systemic chemotherapy or not. Initially he agreed to resume systemic chemotherapy but later on changed  his mind. He has an appointment at [facility] and few weeks for evaluation of his progressive dyspnea and to see if there is any intervention that can be done to improve his breathing. We'll start the patient on Medrol Dosepak for his appetite and shortness of breath. He would come back for follow-up visit in early August 2016 after repeating CT scan of the chest, abdomen and  pelvis for restaging of his disease. He was advised to call if he has any other concerning symptoms in the interval.  Disclaimer: This note was dictated with voice recognition software. Similar sounding words can inadvertently be transcribed and may be missed upon review. Eilleen Kempf., MD 07/10/2014

## 2014-07-06 NOTE — Patient Instructions (Signed)

## 2014-07-06 NOTE — Telephone Encounter (Signed)
s.w. pt wife and advised on cx July appt.... advised on Aug appt....pt ok and aware

## 2014-07-07 ENCOUNTER — Ambulatory Visit: Payer: Medicare HMO | Admitting: Gastroenterology

## 2014-07-09 NOTE — Patient Instructions (Signed)
Follow-up in approximately one month with a restaging CT scan of your chest, abdomen and pelvis to reevaluate your disease Keep the appointment with interventional pulmonology at [facility] to see if stenting is possible to help with your breathing

## 2014-07-13 ENCOUNTER — Ambulatory Visit: Payer: Medicare HMO

## 2014-07-13 ENCOUNTER — Other Ambulatory Visit: Payer: Medicare HMO

## 2014-07-13 ENCOUNTER — Telehealth: Payer: Self-pay | Admitting: *Deleted

## 2014-07-13 NOTE — Telephone Encounter (Signed)
VM message from patient received @ 12:39 pm.  TC to patient. He states he has been on a steroid taper and at the beginning of the taper when steroids were at the higher dose, he started feeling  Better. Now that the taper is over he is back at he poor baseline and is wondering if he could keep on the steroids. His appt at [facility] is not until much later in the month.

## 2014-07-14 ENCOUNTER — Inpatient Hospital Stay (HOSPITAL_COMMUNITY): Payer: Medicare HMO

## 2014-07-14 ENCOUNTER — Encounter (HOSPITAL_COMMUNITY): Payer: Self-pay | Admitting: *Deleted

## 2014-07-14 ENCOUNTER — Emergency Department (HOSPITAL_COMMUNITY): Payer: Medicare HMO

## 2014-07-14 ENCOUNTER — Inpatient Hospital Stay (HOSPITAL_COMMUNITY)
Admission: EM | Admit: 2014-07-14 | Discharge: 2014-07-17 | DRG: 194 | Disposition: A | Discharge status: Home / Self Care | Payer: Medicare HMO | Attending: Internal Medicine | Admitting: Internal Medicine

## 2014-07-14 DIAGNOSIS — C3431 Malignant neoplasm of lower lobe, right bronchus or lung: Secondary | ICD-10-CM | POA: Diagnosis present

## 2014-07-14 DIAGNOSIS — J9610 Chronic respiratory failure, unspecified whether with hypoxia or hypercapnia: Secondary | ICD-10-CM | POA: Diagnosis present

## 2014-07-14 DIAGNOSIS — Z923 Personal history of irradiation: Secondary | ICD-10-CM

## 2014-07-14 DIAGNOSIS — Z8551 Personal history of malignant neoplasm of bladder: Secondary | ICD-10-CM

## 2014-07-14 DIAGNOSIS — R4702 Dysphasia: Secondary | ICD-10-CM | POA: Diagnosis present

## 2014-07-14 DIAGNOSIS — G4700 Insomnia, unspecified: Secondary | ICD-10-CM | POA: Diagnosis present

## 2014-07-14 DIAGNOSIS — Z9221 Personal history of antineoplastic chemotherapy: Secondary | ICD-10-CM | POA: Diagnosis not present

## 2014-07-14 DIAGNOSIS — Y95 Nosocomial condition: Secondary | ICD-10-CM | POA: Diagnosis present

## 2014-07-14 DIAGNOSIS — E1165 Type 2 diabetes mellitus with hyperglycemia: Secondary | ICD-10-CM | POA: Diagnosis present

## 2014-07-14 DIAGNOSIS — A419 Sepsis, unspecified organism: Secondary | ICD-10-CM | POA: Diagnosis not present

## 2014-07-14 DIAGNOSIS — E1142 Type 2 diabetes mellitus with diabetic polyneuropathy: Secondary | ICD-10-CM

## 2014-07-14 DIAGNOSIS — J189 Pneumonia, unspecified organism: Secondary | ICD-10-CM | POA: Diagnosis present

## 2014-07-14 DIAGNOSIS — E039 Hypothyroidism, unspecified: Secondary | ICD-10-CM | POA: Diagnosis present

## 2014-07-14 DIAGNOSIS — I4891 Unspecified atrial fibrillation: Secondary | ICD-10-CM

## 2014-07-14 DIAGNOSIS — R0600 Dyspnea, unspecified: Secondary | ICD-10-CM | POA: Diagnosis present

## 2014-07-14 DIAGNOSIS — Z6823 Body mass index (BMI) 23.0-23.9, adult: Secondary | ICD-10-CM | POA: Diagnosis not present

## 2014-07-14 DIAGNOSIS — J9620 Acute and chronic respiratory failure, unspecified whether with hypoxia or hypercapnia: Secondary | ICD-10-CM | POA: Diagnosis present

## 2014-07-14 DIAGNOSIS — J449 Chronic obstructive pulmonary disease, unspecified: Secondary | ICD-10-CM | POA: Diagnosis present

## 2014-07-14 DIAGNOSIS — J849 Interstitial pulmonary disease, unspecified: Secondary | ICD-10-CM | POA: Diagnosis present

## 2014-07-14 DIAGNOSIS — E138 Other specified diabetes mellitus with unspecified complications: Secondary | ICD-10-CM

## 2014-07-14 DIAGNOSIS — E440 Moderate protein-calorie malnutrition: Secondary | ICD-10-CM | POA: Diagnosis present

## 2014-07-14 DIAGNOSIS — I480 Paroxysmal atrial fibrillation: Secondary | ICD-10-CM | POA: Diagnosis present

## 2014-07-14 DIAGNOSIS — M1990 Unspecified osteoarthritis, unspecified site: Secondary | ICD-10-CM | POA: Diagnosis present

## 2014-07-14 DIAGNOSIS — I951 Orthostatic hypotension: Secondary | ICD-10-CM | POA: Diagnosis present

## 2014-07-14 DIAGNOSIS — R1310 Dysphagia, unspecified: Secondary | ICD-10-CM | POA: Diagnosis present

## 2014-07-14 DIAGNOSIS — E1365 Other specified diabetes mellitus with hyperglycemia: Secondary | ICD-10-CM | POA: Diagnosis present

## 2014-07-14 DIAGNOSIS — Z885 Allergy status to narcotic agent status: Secondary | ICD-10-CM | POA: Diagnosis not present

## 2014-07-14 DIAGNOSIS — IMO0002 Reserved for concepts with insufficient information to code with codable children: Secondary | ICD-10-CM | POA: Diagnosis present

## 2014-07-14 DIAGNOSIS — E785 Hyperlipidemia, unspecified: Secondary | ICD-10-CM | POA: Diagnosis present

## 2014-07-14 DIAGNOSIS — Z66 Do not resuscitate: Secondary | ICD-10-CM | POA: Diagnosis present

## 2014-07-14 DIAGNOSIS — R55 Syncope and collapse: Secondary | ICD-10-CM | POA: Diagnosis present

## 2014-07-14 DIAGNOSIS — K2210 Ulcer of esophagus without bleeding: Secondary | ICD-10-CM | POA: Diagnosis present

## 2014-07-14 DIAGNOSIS — Z87891 Personal history of nicotine dependence: Secondary | ICD-10-CM | POA: Diagnosis not present

## 2014-07-14 DIAGNOSIS — G629 Polyneuropathy, unspecified: Secondary | ICD-10-CM

## 2014-07-14 DIAGNOSIS — K208 Other esophagitis: Secondary | ICD-10-CM | POA: Diagnosis not present

## 2014-07-14 LAB — CBC WITH DIFFERENTIAL/PLATELET
BASOS PCT: 0 % (ref 0–1)
Basophils Absolute: 0 10*3/uL (ref 0.0–0.1)
Eosinophils Absolute: 0.1 10*3/uL (ref 0.0–0.7)
Eosinophils Relative: 1 % (ref 0–5)
HCT: 43.5 % (ref 39.0–52.0)
Hemoglobin: 13.8 g/dL (ref 13.0–17.0)
Lymphocytes Relative: 6 % — ABNORMAL LOW (ref 12–46)
Lymphs Abs: 1.2 10*3/uL (ref 0.7–4.0)
MCH: 30.1 pg (ref 26.0–34.0)
MCHC: 31.7 g/dL (ref 30.0–36.0)
MCV: 94.8 fL (ref 78.0–100.0)
MONO ABS: 2.3 10*3/uL — AB (ref 0.1–1.0)
Monocytes Relative: 12 % (ref 3–12)
NEUTROS ABS: 16.3 10*3/uL — AB (ref 1.7–7.7)
Neutrophils Relative %: 81 % — ABNORMAL HIGH (ref 43–77)
Platelets: 602 10*3/uL — ABNORMAL HIGH (ref 150–400)
RBC: 4.59 MIL/uL (ref 4.22–5.81)
RDW: 16.9 % — AB (ref 11.5–15.5)
WBC: 20 10*3/uL — AB (ref 4.0–10.5)

## 2014-07-14 LAB — COMPREHENSIVE METABOLIC PANEL
ALK PHOS: 81 U/L (ref 38–126)
ALT: 19 U/L (ref 17–63)
AST: 16 U/L (ref 15–41)
Albumin: 3.5 g/dL (ref 3.5–5.0)
Anion gap: 12 (ref 5–15)
BUN: 23 mg/dL — AB (ref 6–20)
CO2: 27 mmol/L (ref 22–32)
Calcium: 9.8 mg/dL (ref 8.9–10.3)
Chloride: 97 mmol/L — ABNORMAL LOW (ref 101–111)
Creatinine, Ser: 0.91 mg/dL (ref 0.61–1.24)
GFR calc Af Amer: >60 mL/min (ref >60)
GFR calc non Af Amer: >60 mL/min (ref >60)
GLUCOSE: 158 mg/dL — AB (ref 65–99)
Potassium: 4.3 mmol/L (ref 3.5–5.1)
Sodium: 136 mmol/L (ref 135–145)
Total Bilirubin: 0.6 mg/dL (ref 0.3–1.2)
Total Protein: 8.3 g/dL — ABNORMAL HIGH (ref 6.5–8.1)

## 2014-07-14 LAB — BRAIN NATRIURETIC PEPTIDE: B Natriuretic Peptide: 333.5 pg/mL — ABNORMAL HIGH (ref 0.0–100.0)

## 2014-07-14 LAB — TROPONIN I

## 2014-07-14 LAB — I-STAT CG4 LACTIC ACID, ED: Lactic Acid, Venous: 1.91 mmol/L (ref 0.5–2.0)

## 2014-07-14 MED ORDER — VANCOMYCIN HCL IN DEXTROSE 1-5 GM/200ML-% IV SOLN
1000.0000 mg | Freq: Two times a day (BID) | INTRAVENOUS | Status: DC
  Administered 2014-07-15 (×3): 1000 mg via INTRAVENOUS
  Filled 2014-07-14 (×4): qty 200

## 2014-07-14 MED ORDER — DILTIAZEM LOAD VIA INFUSION
15.0000 mg | Freq: Once | INTRAVENOUS | Status: AC
  Administered 2014-07-14: 15 mg via INTRAVENOUS
  Filled 2014-07-14: qty 15

## 2014-07-14 MED ORDER — FENTANYL CITRATE (PF) 100 MCG/2ML IJ SOLN
50.0000 ug | Freq: Once | INTRAMUSCULAR | Status: AC
  Administered 2014-07-14: 50 ug via INTRAVENOUS
  Filled 2014-07-14: qty 2

## 2014-07-14 MED ORDER — LEVALBUTEROL HCL 1.25 MG/0.5ML IN NEBU
1.2500 mg | INHALATION_SOLUTION | Freq: Once | RESPIRATORY_TRACT | Status: AC
  Administered 2014-07-14: 1.25 mg via RESPIRATORY_TRACT
  Filled 2014-07-14: qty 0.5

## 2014-07-14 MED ORDER — DILTIAZEM HCL 100 MG IV SOLR
5.0000 mg/h | INTRAVENOUS | Status: DC
  Administered 2014-07-14 – 2014-07-16 (×4): 5 mg/h via INTRAVENOUS
  Filled 2014-07-14 (×4): qty 100

## 2014-07-14 MED ORDER — IOHEXOL 350 MG/ML SOLN
100.0000 mL | Freq: Once | INTRAVENOUS | Status: AC | PRN
  Administered 2014-07-14: 100 mL via INTRAVENOUS

## 2014-07-14 MED ORDER — CEFTAZIDIME 2 G IJ SOLR
2.0000 g | Freq: Three times a day (TID) | INTRAMUSCULAR | Status: DC
  Administered 2014-07-14: 2 g via INTRAVENOUS
  Filled 2014-07-14 (×2): qty 2

## 2014-07-14 MED ORDER — SODIUM CHLORIDE 0.9 % IV BOLUS (SEPSIS)
1000.0000 mL | Freq: Once | INTRAVENOUS | Status: AC
  Administered 2014-07-14: 1000 mL via INTRAVENOUS

## 2014-07-14 NOTE — Telephone Encounter (Signed)
Called pt regarding steroids, spoke to Haynes Dage ( wife) per MD, ok to refill medrol Dose pack, however if pt is requesting continuous high dose of steroids this will need to come from Dr. Golden Pop office or pulmonologist at [facility]. Pt's wife concerned as pt is unable to ambulate from room to room without feeling like he is going to collapse, steroids helped for first few days then Mandeep was feeling worse again. Pt and wife have cancelled trip to [place] to visit with daughter as pt is too weak. Appt at [facility] is 7/26, wife voiced she is unsure if he can wait until then to be evaluated. Discussed with wife pt should be further evaluated at ED if he is having difficulty ambulating from room to room without collapsing, difficulty breathing, or chest pain. Wife states " I know he is getting worse, there's nothing they can do to help and he may even need a feeding tube because he is not able to eat. Wife advised she will consult with pt regarding refill on medrol dose pak and further evaluation at ED. No further concerns at this time. Pt and wife concerns reviewed with MD

## 2014-07-14 NOTE — ED Provider Notes (Signed)
CSN: [id number]     Arrival date & time 07/14/14  1830 History   First MD Initiated Contact with Patient 07/14/14 1843     Chief Complaint  Patient presents with  . Shortness of Breath     (Consider location/radiation/quality/duration/timing/severity/associated sxs/prior Treatment) HPI  74 year old male presents with progressive shortness of breath over the past 1 month or more. Patient has a history of lung cancer and has been on and off chemotherapy. Recently taken off of chemotherapy a couple weeks ago due to intolerance to the medicine. Has been having progressive chest pain and shortness of breath over this one month time. Not significant short of breath at rest but with any slight movement or walking he gets significant short of breath. Has a history of atrial fibrillation but does not feel like he is in it now. No fevers. Has a dry cough.  Past Medical History  Diagnosis Date  . Dizziness and giddiness     positional vertigo  . Hyperlipidemia   . Impotence of organic origin   . Osteoarthritis, shoulder   . Allergy   . Hypothyroidism     pt denies thryoid disease, dr pandey note 11-19-12 says subclinical hypothryoid in epic  . COPD (chronic obstructive pulmonary disease)     pt denies  . Insomnia   . Hx of radiation therapy 02/2013    right lung  . Diabetes mellitus without complication     pt denies diabetes, dr pandey note says dm 11-19-12 epic  . 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 Malignant neoplasm of bladder, part unspecified   . Malignant neoplasm of bronchus and lung, unspecified site    Past Surgical History  Procedure Laterality Date  . Cystoscopy w/ dilation of bladder    . Tonsillectomy  age 33  . Endobronchial ultrasound Bilateral 12/15/2012    Procedure: ENDOBRONCHIAL ULTRASOUND;  Surgeon: Brand Males, MD;  Location: [facility];  Service: Cardiopulmonary;  Laterality: Bilateral;  . Colonoscopy  2009    [facility]   Family History   Problem Relation Age of Onset  . Kidney disease Brother   . Hypertension Mother   . Diabetes Mother   . Cancer Father     type unknown  . Stroke Father    History  Substance Use Topics  . Smoking status: Former Smoker -- 1.50 packs/day for 50 years    Types: Cigarettes    Quit date: 01/09/2007  . Smokeless tobacco: Former Systems developer    Types: Chew    Quit date: 05/28/2014  . Alcohol Use: No    Review of Systems  Constitutional: Negative for fever.  Respiratory: Positive for cough and shortness of breath.   Cardiovascular: Positive for chest pain.  Gastrointestinal: Negative for vomiting and abdominal pain.  All other systems reviewed and are negative.     Allergies  Morphine and related  Home Medications   Prior to Admission medications   Medication Sig Start Date End Date Taking? Authorizing Provider  aspirin 81 MG tablet Take 81 mg by mouth daily.    Historical Provider, MD  DULoxetine (CYMBALTA) 60 MG capsule  05/09/14   Historical Provider, MD  flecainide (TAMBOCOR) 150 MG tablet  06/20/14   Historical Provider, MD  gabapentin (NEURONTIN) 300 MG capsule TAKE 1 CAPSULE BY MOUTH THREE TIMES DAILY 12/07/13   [name], MD  Ipratropium-Albuterol (COMBIVENT) 20-100 MCG/ACT AERS respimat Inhale 1 puff into the lungs every 6 (six) hours. 06/03/14   Adrena  E Johnson, PA-C  lidocaine (XYLOCAINE) 2 % solution Use as directed 20 mLs in the mouth or throat every 6 (six) hours as needed for mouth pain. 06/22/14   Maryanna Shape, NP  methylPREDNISolone (MEDROL DOSEPAK) 4 MG TBPK tablet Take as directed with food 07/06/14   Carlton Adam, PA-C  metoprolol tartrate (LOPRESSOR) 25 MG tablet Take 1 tablet (25 mg total) by mouth 2 (two) times daily. 01/22/14   Fay Records, MD  midodrine (PROAMATINE) 2.5 MG tablet Take 2.5 mg by mouth 2 (two) times daily with a meal.  04/20/14   Historical Provider, MD  naproxen sodium (ANAPROX) 220 MG tablet Take 220 mg by mouth 2 (two) times daily with a  meal.    Historical Provider, MD  omeprazole (PRILOSEC) 40 MG capsule Take 1 capsule (40 mg total) by mouth daily. 05/10/14   Lauree Chandler, NP  oxyCODONE-acetaminophen (ROXICET) 5-325 MG per tablet Take one tablet by mouth every 6 hours as needed for pain 06/25/14   Gildardo Cranker, DO  predniSONE (DELTASONE) 10 MG tablet Take 4 tablets x 2 days, 2 tablets x 2 days, 1 tablet x 2 days, 1/2 tablet x 2 days then stop. 06/16/14   Brand Males, MD  simvastatin (ZOCOR) 20 MG tablet Take one tablet by mouth once daily to lower cholesterol 10/21/13   [name], MD  temazepam (RESTORIL) 30 MG capsule Take 1 capsule (30 mg total) by mouth at bedtime as needed for sleep. 07/02/14   [name], MD   BP 110/82 mmHg  Pulse 155  Resp 24  SpO2 100% Physical Exam  Constitutional: He is oriented to person, place, and time. He appears well-developed and well-nourished.  HENT:  Head: Normocephalic and atraumatic.  Right Ear: External ear normal.  Left Ear: External ear normal.  Nose: Nose normal.  Eyes: Right eye exhibits no discharge. Left eye exhibits no discharge.  Neck: Neck supple.  Cardiovascular: Normal heart sounds and intact distal pulses.  An irregularly irregular rhythm present. Tachycardia present.   Pulmonary/Chest: Effort normal. He has wheezes.  Abdominal: Soft. He exhibits no distension. There is no tenderness.  Musculoskeletal: He exhibits no edema.  Neurological: He is alert and oriented to person, place, and time.  Skin: Skin is warm and dry.  Nursing note and vitals reviewed.   ED Course  Procedures (including critical care time) Labs Review Labs Reviewed  COMPREHENSIVE METABOLIC PANEL - Abnormal; Notable for the following:    Chloride 97 (*)    Glucose, Bld 158 (*)    BUN 23 (*)    Total Protein 8.3 (*)    All other components within normal limits  CBC WITH DIFFERENTIAL/PLATELET - Abnormal; Notable for the following:    WBC 20.0 (*)    RDW 16.9 (*)    Platelets  602 (*)    Neutrophils Relative % 81 (*)    Neutro Abs 16.3 (*)    Lymphocytes Relative 6 (*)    Monocytes Absolute 2.3 (*)    All other components within normal limits  BRAIN NATRIURETIC PEPTIDE - Abnormal; Notable for the following:    B Natriuretic Peptide 333.5 (*)    All other components within normal limits  CULTURE, BLOOD (ROUTINE X 2)  CULTURE, BLOOD (ROUTINE X 2)  TROPONIN I  I-STAT CG4 LACTIC ACID, ED    Imaging Review Dg Chest Port 1 View  07/14/2014   CLINICAL DATA:  Worsening shortness of breath and chest pain. Currently  on chemotherapy for right lower lobe lung carcinoma.  EXAM: PORTABLE CHEST - 1 VIEW  COMPARISON:  06/10/2014  FINDINGS: Right-sided power port remains in appropriate position. Right infrahilar mass like opacity shows no significant change allowing for differences in positioning. Pleural- parenchymal scarring at right lung base appears stable. No new areas of pulmonary opacity are identified. No evidence of pneumothorax or pleural effusion. Heart size is within normal limits.  IMPRESSION: No significant change in right infrahilar masslike opacity and right basilar scarring.   Electronically Signed   By: Earle Gell M.D.   On: 07/14/2014 19:33     EKG Interpretation   Date/Time:  Wednesday July 14 2014 18:36:31 EDT Ventricular Rate:  153 PR Interval:    QRS Duration: 84 QT Interval:  296 QTC Calculation: 472 R Axis:   24 Text Interpretation:  Atrial fibrillation with rapid V-rate Abnormal  R-wave progression, early transition Repolarization abnormality, prob rate  related Baseline wander in lead(s) V3 V6 rate is faster compared to Dec  2015 Confirmed by Kimaya Whitlatch  MD, [place] ([id number]) on 07/14/2014 6:44:16 PM      CRITICAL CARE Performed by: Sherwood Gambler T   Total critical care time: 30 minutes  Critical care time was exclusive of separately billable procedures and treating other patients.  Critical care was necessary to treat or prevent imminent or  life-threatening deterioration.  Critical care was time spent personally by me on the following activities: development of treatment plan with patient and/or surrogate as well as nursing, discussions with consultants, evaluation of patient's response to treatment, examination of patient, obtaining history from patient or surrogate, ordering and performing treatments and interventions, ordering and review of laboratory studies, ordering and review of radiographic studies, pulse oximetry and re-evaluation of patient's condition.  MDM   Final diagnoses:  Atrial fibrillation with RVR    With increased dyspnea, atrophic fibrillation with RVR, and low-grade fever I am concerned about pneumonia. Patient is immunocompromised with recent chemotherapy and lung cancer. He was given broad-spectrum IV antibodies after blood cultures. Patient's heart rate improved and is currently maintained on a diltiazem drip. The patient does have some wheezing and feels significant better after 1 breathing treatment. Patient's airway is stable at this time and he does not need more invasive management for his airway. He will need admission to the step down unit. After discussion with the hospitalist, Dr. Roel Cluck, will get CT to r/o PE as a possible cause as well.    Sherwood Gambler, MD 07/14/14 2248

## 2014-07-14 NOTE — ED Notes (Signed)
Valsalva maneuver unsuccessful.

## 2014-07-14 NOTE — ED Notes (Signed)
Patient transported to CT 

## 2014-07-14 NOTE — ED Notes (Signed)
Pt with Hx of lung cancer and COPD c/o SOB x 1.5 years, pt is significantly SOB on exertion of ambulating from wheelchair to triage chair, inspiratory and expiratory wheezing present. Pt had imaging done of his esophagus 2 weeks ago which showed narrowing of esophagus. HR 155.

## 2014-07-14 NOTE — H&P (Signed)
PCP: [name], MD  Oncology [facility]  Referring provider Verta Ellen   Chief Complaint:  Dyspnea chest pain  HPI: Steve [name] is a 74 y.o. male   has a past medical history of Dizziness and giddiness; Hyperlipidemia; Impotence of organic origin; Osteoarthritis, shoulder; Allergy; Hypothyroidism; COPD (chronic obstructive pulmonary disease); Insomnia; radiation therapy (02/2013); Diabetes mellitus without complication; Peripheral neuropathy; Malignant neoplasm of bladder, part unspecified; and Malignant neoplasm of bronchus and lung, unspecified site.   Presented with worsening dyspnea worse from months but have been getting gradually especially after last chemo. Patient states he presented to emergency department because   his chest pain and shortness of breath have got so bad that he could hardly walk few steps before becoming breathless. Steve [name] reports continuous chest pain on the left side of his chest that has been going on for almost a month. In emergency department patient was noted to have A. fib with RVR. He states he has it on the regular basis and usually that does not bother him. Patient was started on diltiazem drip and his heart rate has decreased down to 80s. Patient states that his symptoms have not been alleviated despite this.  Patient has history of metastatic non-small cell lung cancer diagnosis 2014. He status post chemoradiation and consolidation chemotherapy and then immunotherapy. He recently finished gemcitabine treatment and feels like since then have been having worsening shortness of breath. Last infusion was on 28th of June. Patient endorses mild cough. Reports foul tasting phlegm that he coughs up. Patient was seen by pulmonology was unable to complete PFTs due to being short of breath. The plan is for patient to follow-up at [facility] with pulmonology for evaluation for possible stent placement has an appointment 26 of  July. He is chemotherapy for now was placed on hold until he is being seen by do interventional pulmonology. She have had recurrent CT scans no  PE. One CT scan was done today in the ER also did not show any evidence of PE but there is evidence of some postobstructive pneumonia.   Regarding patient's shortness of breath patient has been seen extensively by pulmonology, first was thought that he could've had chemotherapy induced toxicity but CT scan was not consistent with that. She undergone cardiac workup but was also negative. A Shin had a negative stress test done and a 2016. She was also supposed to have him see workup to rule out vocal cord issues. He was treated with oral steroids and inhalers without much improvement.  Patient also has been endorsing dysphasia has been there for almost a month. It's been progressive at first worse for solids now even liquids. June 23 he has undergone endoscopy which showed severe stricture with associated erosions and exudates. Appear to be extrinsic likely related to mediastinal lymphadenopathy. Patient was recommended to have liquid diet only  Hospitalist was called for admission for pneumonia  Review of Systems:    Pertinent positives include:  chest pain, shortness of breath at rest.  Constitutional:  No weight loss, night sweats, Fevers, chills, fatigue, weight loss  HEENT:  No headaches, Difficulty swallowing,Tooth/dental problems,Sore throat,  No sneezing, itching, ear ache, nasal congestion, post nasal drip,  Cardio-vascular:  No  Orthopnea, PND, anasarca, dizziness, palpitations.no Bilateral lower extremity swelling  GI:  No heartburn, indigestion, abdominal pain, nausea, vomiting, diarrhea, change in bowel habits, loss of appetite, melena, blood in stool, hematemesis Resp:  no No dyspnea on exertion, No excess  mucus, no productive cough, No non-productive cough, No coughing up of blood.No change in color of mucus.No wheezing. Skin:  no rash  or lesions. No jaundice GU:  no dysuria, change in color of urine, no urgency or frequency. No straining to urinate.  No flank pain.  Musculoskeletal:  No joint pain or no joint swelling. No decreased range of motion. No back pain.  Psych:  No change in mood or affect. No depression or anxiety. No memory loss.  Neuro: no localizing neurological complaints, no tingling, no weakness, no double vision, no gait abnormality, no slurred speech, no confusion  Otherwise ROS are negative except for above, 10 systems were reviewed  Past Medical History: Past Medical History  Diagnosis Date  . Dizziness and giddiness     positional vertigo  . Hyperlipidemia   . Impotence of organic origin   . Osteoarthritis, shoulder   . Allergy   . Hypothyroidism     pt denies thryoid disease, dr pandey note 11-19-12 says subclinical hypothryoid in epic  . COPD (chronic obstructive pulmonary disease)     pt denies  . Insomnia   . Hx of radiation therapy 02/2013    right lung  . Diabetes mellitus without complication     pt denies diabetes, dr pandey note says dm 11-19-12 epic  . 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 Malignant neoplasm of bladder, part unspecified   . Malignant neoplasm of bronchus and lung, unspecified site    Past Surgical History  Procedure Laterality Date  . Cystoscopy w/ dilation of bladder    . Tonsillectomy  age 43  . Endobronchial ultrasound Bilateral 12/15/2012    Procedure: ENDOBRONCHIAL ULTRASOUND;  Surgeon: Brand Males, MD;  Location: [facility] ENDOSCOPY;  Service: Cardiopulmonary;  Laterality: Bilateral;  . Colonoscopy  2009     GI     Medications: Prior to Admission medications   Medication Sig Start Date End Date Taking? Authorizing Provider  flecainide (TAMBOCOR) 50 MG tablet Take 75 mg by mouth 2 (two) times daily. 06/24/14  Yes Historical Provider, MD  Ipratropium-Albuterol (COMBIVENT) 20-100 MCG/ACT AERS respimat Inhale 1 puff into the  lungs every 6 (six) hours. 06/03/14  Yes Adrena E Johnson, PA-C  lidocaine (XYLOCAINE) 2 % solution Use as directed 20 mLs in the mouth or throat every 6 (six) hours as needed for mouth pain. 06/22/14  Yes Maryanna Shape, NP  metoprolol tartrate (LOPRESSOR) 25 MG tablet Take 1 tablet (25 mg total) by mouth 2 (two) times daily. 01/22/14  Yes Fay Records, MD  midodrine (PROAMATINE) 2.5 MG tablet Take 2.5 mg by mouth 2 (two) times daily with a meal.  04/20/14  Yes Historical Provider, MD  OVER THE COUNTER MEDICATION Take 2 tablets by mouth at bedtime. "Dulcolax with stool softener."   Yes Historical Provider, MD  oxyCODONE-acetaminophen (ROXICET) 5-325 MG per tablet Take one tablet by mouth every 6 hours as needed for pain 06/25/14  Yes Gildardo Cranker, DO  simvastatin (ZOCOR) 20 MG tablet Take one tablet by mouth once daily to lower cholesterol 10/21/13  Yes [name], MD  temazepam (RESTORIL) 30 MG capsule Take 1 capsule (30 mg total) by mouth at bedtime as needed for sleep. 07/02/14  Yes [name], MD  gabapentin (NEURONTIN) 300 MG capsule TAKE 1 CAPSULE BY MOUTH THREE TIMES DAILY Patient not taking: Reported on 07/14/2014 12/07/13   [name], MD    Allergies:   Allergies  Allergen Reactions  .  Morphine And Related Hives    Social History:  Ambulatory   independently limited due to breathlessness Lives at home   With family     reports that he quit smoking about 7 years ago. His smoking use included Cigarettes. He has a 75 pack-year smoking history. He quit smokeless tobacco use about 6 weeks ago. His smokeless tobacco use included Chew. He reports that he does not drink alcohol or use illicit drugs.    Family History: family history includes Cancer in his father; Diabetes in his mother; Hypertension in his mother; Kidney disease in his brother; Stroke in his father.    Physical Exam: Patient Vitals for the past 24 hrs:  BP Temp Temp src Pulse Resp SpO2  07/14/14 2209 94/65  mmHg - - 98 17 100 %  07/14/14 2140 91/65 mmHg - - 106 14 100 %  07/14/14 2130 97/70 mmHg - - 106 17 100 %  07/14/14 2100 106/63 mmHg - - (!) 58 16 100 %  07/14/14 2053 110/89 mmHg - - 117 17 100 %  07/14/14 2030 113/75 mmHg - - 72 13 93 %  07/14/14 2013 - 100.4 F (38 C) Rectal - - -  07/14/14 1945 112/64 mmHg - - (!) 55 18 100 %  07/14/14 1918 - - - - - (!) 85 %  07/14/14 1848 - - - - 24 -  07/14/14 1838 110/82 mmHg - - (!) 155 18 100 %    1. General:  in No Acute distress 2. Psychological: Alert and   Oriented 3. Head/ENT:   Moist  Mucous Membranes                          Head Non traumatic, neck supple                          Normal   Dentition 4. SKIN:   decreased Skin turgor,  Skin clean Dry and intact no rash 5. Heart: Regular rate and rhythm no Murmur, Rub or gallop 6. Lungs: Occasional wheezes right worse in the left coarse breath sounds on the left   7. Abdomen: Soft, non-tender, Non distended 8. Lower extremities: no clubbing, cyanosis, or edema 9. Neurologically Grossly intact, moving all 4 extremities equally 10. MSK: Normal range of motion  body mass index is unknown because there is no weight on file.   Labs on Admission:   Results for orders placed or performed during the hospital encounter of 07/14/14 (from the past 24 hour(s))  Comprehensive metabolic panel     Status: Abnormal   Collection Time: 07/14/14  7:15 PM  Result Value Ref Range   Sodium 136 135 - 145 mmol/L   Potassium 4.3 3.5 - 5.1 mmol/L   Chloride 97 (L) 101 - 111 mmol/L   CO2 27 22 - 32 mmol/L   Glucose, Bld 158 (H) 65 - 99 mg/dL   BUN 23 (H) 6 - 20 mg/dL   Creatinine, Ser 0.91 0.61 - 1.24 mg/dL   Calcium 9.8 8.9 - 10.3 mg/dL   Total Protein 8.3 (H) 6.5 - 8.1 g/dL   Albumin 3.5 3.5 - 5.0 g/dL   AST 16 15 - 41 U/L   ALT 19 17 - 63 U/L   Alkaline Phosphatase 81 38 - 126 U/L   Total Bilirubin 0.6 0.3 - 1.2 mg/dL   GFR calc non Af Amer >60 >60 mL/min   GFR  calc Af Amer >60 >60 mL/min    Anion gap 12 5 - 15  Troponin I     Status: None   Collection Time: 07/14/14  7:15 PM  Result Value Ref Range   Troponin I <0.03 <0.031 ng/mL  CBC with Differential     Status: Abnormal   Collection Time: 07/14/14  7:15 PM  Result Value Ref Range   WBC 20.0 (H) 4.0 - 10.5 K/uL   RBC 4.59 4.22 - 5.81 MIL/uL   Hemoglobin 13.8 13.0 - 17.0 g/dL   HCT 43.5 39.0 - 52.0 %   MCV 94.8 78.0 - 100.0 fL   MCH 30.1 26.0 - 34.0 pg   MCHC 31.7 30.0 - 36.0 g/dL   RDW 16.9 (H) 11.5 - 15.5 %   Platelets 602 (H) 150 - 400 K/uL   Neutrophils Relative % 81 (H) 43 - 77 %   Neutro Abs 16.3 (H) 1.7 - 7.7 K/uL   Lymphocytes Relative 6 (L) 12 - 46 %   Lymphs Abs 1.2 0.7 - 4.0 K/uL   Monocytes Relative 12 3 - 12 %   Monocytes Absolute 2.3 (H) 0.1 - 1.0 K/uL   Eosinophils Relative 1 0 - 5 %   Eosinophils Absolute 0.1 0.0 - 0.7 K/uL   Basophils Relative 0 0 - 1 %   Basophils Absolute 0.0 0.0 - 0.1 K/uL  Brain natriuretic peptide     Status: Abnormal   Collection Time: 07/14/14  7:16 PM  Result Value Ref Range   B Natriuretic Peptide 333.5 (H) 0.0 - 100.0 pg/mL  I-Stat CG4 Lactic Acid, ED     Status: None   Collection Time: 07/14/14  7:22 PM  Result Value Ref Range   Lactic Acid, Venous 1.91 0.5 - 2.0 mmol/L     Lab Results  Component Value Date   HGBA1C 6.5* 05/10/2014    Estimated Creatinine Clearance: 74.6 mL/min (by C-G formula based on Cr of 0.91).  BNP (last 3 results) No results for input(s): PROBNP in the last 8760 hours.  Other results:  I have pearsonaly reviewed this: ECG REPORT  Rate:153  Rhythm: Atrial fibrillation with RVR ST&T Change: Minimal ST depression and lateral leads   There were no vitals filed for this visit.   Cultures:    Component Value Date/Time   SDES FLUID RIGHT PLEURAL 02/11/2014 1405   SPECREQUEST NONE 02/11/2014 1405   CULT  02/11/2014 1405    NO GROWTH 3 DAYS Performed at [facility] 02/15/2014 FINAL 02/11/2014 1405       Radiological Exams on Admission: Ct Angio Chest Pe W/cm &/or Wo Cm  07/14/2014   CLINICAL DATA:  74 year old male with history of lung cancer and COPD complaining of shortness of breath.  EXAM: CT ANGIOGRAPHY CHEST WITH CONTRAST  TECHNIQUE: Multidetector CT imaging of the chest was performed using the standard protocol during bolus administration of intravenous contrast. Multiplanar CT image reconstructions and MIPs were obtained to evaluate the vascular anatomy.  CONTRAST:  163m OMNIPAQUE IOHEXOL 350 MG/ML SOLN  COMPARISON:  Chest radiograph dated 07/14/2014 and CT dated 05/14/2014.  FINDINGS: Mild emphysematous changes. There is stable appearing 7.0 x 4.5 cm right lower lobe mass with encasement of the right middle and right lower lobe bronchi and occlusion of the right lower lobe bronchus. There is associated postobstructive atelectasis versus pneumonia. Stable moderate right pleural effusion again noted. There is apparent extension of the mass into the right hilar and subcarinal  region.  Mild atherosclerotic calcification of the aorta. No CT evidence of pulmonary embolism. There is encasement and complete occlusion of the right lower lobe pulmonary artery branch by the lung mass. No cardiomegaly. There is coronary vascular calcification. Small pericardial effusion.  Right pectoral Port-A-Cath with tip in central SVC. Partially visualized subcentimeter left hepatic hypodense lesion, incompletely characterized. Degenerative changes of the spine. No acute fracture.  Review of the MIP images confirms the above findings.  IMPRESSION: No CT evidence of pulmonary embolism.  Stable appearing right lower lobe mass with encasement of the right lower lobe pulmonary arteries branch as well as right lower lobe bronchus. There is postobstructive atelectasis/ pneumonia.  Grossly stable right pleural effusion.  Scratch   Electronically Signed   By: Anner Crete M.D.   On: 07/14/2014 22:45   Dg Chest Port 1  View  07/14/2014   CLINICAL DATA:  Worsening shortness of breath and chest pain. Currently on chemotherapy for right lower lobe lung carcinoma.  EXAM: PORTABLE CHEST - 1 VIEW  COMPARISON:  06/10/2014  FINDINGS: Right-sided power port remains in appropriate position. Right infrahilar mass like opacity shows no significant change allowing for differences in positioning. Pleural- parenchymal scarring at right lung base appears stable. No new areas of pulmonary opacity are identified. No evidence of pneumothorax or pleural effusion. Heart size is within normal limits.  IMPRESSION: No significant change in right infrahilar masslike opacity and right basilar scarring.   Electronically Signed   By: Earle Gell M.D.   On: 07/14/2014 19:33    Chart has been reviewed  Family at  Bedside  plan of care was discussed with  Hansel Feinstein  Bracy Pepper [phone] cell [phone] Assessment/Plan  74 yo Male with hx of non-small cell lung CA. 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    Present on Admission:  . HCAP (healthcare-associated pneumonia)  - on vanc and zosyn for post-obructive PNA Sepsis - meeting sepsis criteria with heart rate up to 155 respirations up to 26 Filler blood cell 20 most likely source pneumonia treated with IV fluids and antibodies monitor step down , blood cultures and sputum cultures obtained  . Atrial fibrillation with RVR - diltiazem gtt to stepdwon, [place] score 4, patient was taken flecanide for history of SVT, Afib well controlled no hx of this prior  Given diagnosis of metastatic lung cancer with possible need for intervention and/or procedures due to ongoing dyspnea will hold off on anticoagulation for now.  obtain echogram. We'll need to discuss with patient long-term risks and benefit from initiation of anticoagulation in his case. ECHO ordered . Uncontrolled secondary diabetes mellitus with  complication - SSI  . Primary cancer of right lower lobe of lung - will need to notify oncology in [place]  . Wheezing - xopenex, solumedrol '40mg'$  q6h . Dyspnea - been ongoing for the past few months. Has been progressive discussed with pulmonology who will evaluate in the morning. Former smoker but states was not of a diagnosis COPD. We'll defer to pulmonology if he would benefit from bronchoscopy to evaluate for any tumor obstructing the bronchi. For now made nothing by mouth post midnight in case he needs any procedures  Chest pain likely related to lung cancer. He has been ongoing for past one month. We'll need to adjust patient's medication to liquid as he is having trouble swallowing pills  Dysphasia - once seen by pulmonology if no  need for procedures we'll proceed to liquid diet only  Prophylaxis: SCD  CODE STATUS:  DNR/DNI as per patient    Disposition: To home once workup is complete and patient is stable  Other plan as per orders.  I have spent a total of 65 min on this admission extra time was taken due to complexity of medical decision making and to discuss case with pulmonology  [place] 07/14/2014, 11:04 PM  [facility]  Pager [phone]   after 2 AM please page floor coverage PA If 7AM-7PM, please contact the day team taking care of the patient  [email or web address]  Password [email or web address]

## 2014-07-14 NOTE — Telephone Encounter (Signed)
Patient's spouse Steve [name] called requesting answer to yesterday's question and to ask for sooner appointment at [facility] with Pulmonary specialist.  "Steve [name] was feeling better when he started the steroid (Medrol dose pack) and now he is breathing worse daily.  The wheezing is not worse and he coughs a lot.  He says he has a bad taste when he coughs.  He can't get enough air in but we're always told his oxygen level is good and he doesn't need home oxygen.  No swelling anywhere.  He has no activity and only drinks Boost and Gatorade and can't live like on Boost but can't swallow because lung is pressing on esophagus.  Appointment at [facility] will be 08-03-2014 and he needs to be seen sooner because he's getting worse and we need answers and help with this problem."  Return number [phone].

## 2014-07-14 NOTE — Progress Notes (Addendum)
ANTIBIOTIC CONSULT NOTE - INITIAL  Pharmacy Consult for vancomycin/ceftazidime Indication: pneumonia  Allergies  Allergen Reactions  . Morphine And Related Hives    Patient Measurements:    Vital Signs: Temp: 100.4 F (38 C) (07/06 2013) Temp Source: Rectal (07/06 2013) BP: 113/75 mmHg (07/06 2030) Pulse Rate: 72 (07/06 2030) Intake/Output from previous day:   Intake/Output from this shift:    Labs:  Recent Labs  07/14/14 1915  WBC 20.0*  HGB 13.8  PLT 602*  CREATININE 0.91   Estimated Creatinine Clearance: 74.6 mL/min (by C-G formula based on Cr of 0.91). No results for input(s): VANCOTROUGH, VANCOPEAK, VANCORANDOM, GENTTROUGH, GENTPEAK, GENTRANDOM, TOBRATROUGH, TOBRAPEAK, TOBRARND, AMIKACINPEAK, AMIKACINTROU, AMIKACIN in the last 72 hours.   Microbiology: No results found for this or any previous visit (from the past 720 hour(s)).  Medical History: Past Medical History  Diagnosis Date  . Dizziness and giddiness     positional vertigo  . Hyperlipidemia   . Impotence of organic origin   . Osteoarthritis, shoulder   . Allergy   . Hypothyroidism     pt denies thryoid disease, dr pandey note 11-19-12 says subclinical hypothryoid in epic  . COPD (chronic obstructive pulmonary disease)     pt denies  . Insomnia   . Hx of radiation therapy 02/2013    right lung  . Diabetes mellitus without complication     pt denies diabetes, dr pandey note says dm 11-19-12 epic  . 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 Malignant neoplasm of bladder, part unspecified   . Malignant neoplasm of bronchus and lung, unspecified site     Medications:  Anti-infectives    None     Assessment: 74 y.o. male with metastatic NSCLC on active chemo presents 07/14/2014 with progressive SOB and chest pain x 1 month. CXR unremarkable.  Pharmacy to begin vancomycin/ceftazidime for r/o HCAP.  7/6 >> vancomycin >> 7/6 >> ceftazidime >>    Temp: 100.4 WBC: 20 Renal: SCr  wnl; CrCl 75 CG   Goal of Therapy:  Vancomycin trough level 15-20 mcg/ml  Eradication of infection Appropriate antibiotic dosing for indication and renal function  Plan:  Day 1 antibiotics Vancomycin 1000 mg IV q12 hr Measure vancomycin trough levels at steady state as indicated Ceftazidime 2g IV q8 hr  Follow clinical course, renal function, culture results as available  Follow for de-escalation of antibiotics and LOT   Reuel Boom, PharmD, BCPS Pager: [phone] 07/14/2014, 8:39 PM  Update: MD change to zosyn per pharmacy Will d/c ceftazidime Add Zosyn 3.375g IV Q8H infused over 4hrs.

## 2014-07-15 ENCOUNTER — Encounter (HOSPITAL_COMMUNITY): Payer: Self-pay

## 2014-07-15 ENCOUNTER — Ambulatory Visit (HOSPITAL_COMMUNITY): Payer: Medicare HMO

## 2014-07-15 DIAGNOSIS — I951 Orthostatic hypotension: Secondary | ICD-10-CM

## 2014-07-15 DIAGNOSIS — K2210 Ulcer of esophagus without bleeding: Secondary | ICD-10-CM | POA: Diagnosis present

## 2014-07-15 DIAGNOSIS — J189 Pneumonia, unspecified organism: Secondary | ICD-10-CM | POA: Diagnosis present

## 2014-07-15 DIAGNOSIS — C3431 Malignant neoplasm of lower lobe, right bronchus or lung: Secondary | ICD-10-CM

## 2014-07-15 DIAGNOSIS — E440 Moderate protein-calorie malnutrition: Secondary | ICD-10-CM | POA: Diagnosis present

## 2014-07-15 DIAGNOSIS — R0600 Dyspnea, unspecified: Secondary | ICD-10-CM

## 2014-07-15 DIAGNOSIS — I4891 Unspecified atrial fibrillation: Secondary | ICD-10-CM

## 2014-07-15 HISTORY — DX: Orthostatic hypotension: I95.1

## 2014-07-15 LAB — COMPREHENSIVE METABOLIC PANEL
ALT: 19 U/L (ref 17–63)
AST: 17 U/L (ref 15–41)
Albumin: 3 g/dL — ABNORMAL LOW (ref 3.5–5.0)
Alkaline Phosphatase: 75 U/L (ref 38–126)
Anion gap: 7 (ref 5–15)
BUN: 18 mg/dL (ref 6–20)
CO2: 27 mmol/L (ref 22–32)
Calcium: 8.8 mg/dL — ABNORMAL LOW (ref 8.9–10.3)
Chloride: 101 mmol/L (ref 101–111)
Creatinine, Ser: 0.72 mg/dL (ref 0.61–1.24)
GFR calc Af Amer: >60 mL/min (ref >60)
GLUCOSE: 115 mg/dL — AB (ref 65–99)
POTASSIUM: 3.8 mmol/L (ref 3.5–5.1)
Sodium: 135 mmol/L (ref 135–145)
Total Bilirubin: 0.5 mg/dL (ref 0.3–1.2)
Total Protein: 7 g/dL (ref 6.5–8.1)

## 2014-07-15 LAB — GLUCOSE, CAPILLARY
GLUCOSE-CAPILLARY: 140 mg/dL — AB (ref 65–99)
GLUCOSE-CAPILLARY: 216 mg/dL — AB (ref 65–99)
GLUCOSE-CAPILLARY: 202 mg/dL — AB (ref 65–99)
Glucose-Capillary: 130 mg/dL — ABNORMAL HIGH (ref 65–99)
Glucose-Capillary: 196 mg/dL — ABNORMAL HIGH (ref 65–99)

## 2014-07-15 LAB — PROCALCITONIN: Procalcitonin: <0.1 ng/mL

## 2014-07-15 LAB — TSH: TSH: 0.474 u[IU]/mL (ref 0.350–4.500)

## 2014-07-15 LAB — MAGNESIUM: Magnesium: 1.9 mg/dL (ref 1.7–2.4)

## 2014-07-15 LAB — PHOSPHORUS: Phosphorus: 2.4 mg/dL — ABNORMAL LOW (ref 2.5–4.6)

## 2014-07-15 LAB — MRSA PCR SCREENING: MRSA BY PCR: NEGATIVE

## 2014-07-15 LAB — STREP PNEUMONIAE URINARY ANTIGEN: Strep Pneumo Urinary Antigen: NEGATIVE

## 2014-07-15 MED ORDER — HYDROCODONE-ACETAMINOPHEN 7.5-325 MG/15ML PO SOLN
10.0000 mL | ORAL | Status: DC | PRN
  Administered 2014-07-15 – 2014-07-17 (×6): 10 mL via ORAL
  Filled 2014-07-15 (×6): qty 15

## 2014-07-15 MED ORDER — SIMVASTATIN 20 MG PO TABS
20.0000 mg | ORAL_TABLET | Freq: Every day | ORAL | Status: DC
  Administered 2014-07-15 – 2014-07-16 (×2): 20 mg via ORAL
  Filled 2014-07-15: qty 1
  Filled 2014-07-15: qty 2
  Filled 2014-07-15: qty 1

## 2014-07-15 MED ORDER — FLECAINIDE ACETATE 50 MG PO TABS
75.0000 mg | ORAL_TABLET | Freq: Two times a day (BID) | ORAL | Status: DC
  Administered 2014-07-15 – 2014-07-16 (×3): 75 mg via ORAL
  Filled 2014-07-15 (×5): qty 2

## 2014-07-15 MED ORDER — METOPROLOL TARTRATE 25 MG PO TABS
25.0000 mg | ORAL_TABLET | Freq: Two times a day (BID) | ORAL | Status: DC
  Administered 2014-07-15 – 2014-07-17 (×4): 25 mg via ORAL
  Filled 2014-07-15 (×5): qty 1

## 2014-07-15 MED ORDER — GABAPENTIN 300 MG PO CAPS: 300.0000 mg | ORAL_CAPSULE | Freq: Three times a day (TID) | ORAL | Status: DC

## 2014-07-15 MED ORDER — METHYLPREDNISOLONE SODIUM SUCC 40 MG IJ SOLR
40.0000 mg | Freq: Four times a day (QID) | INTRAMUSCULAR | Status: DC
  Administered 2014-07-15 – 2014-07-16 (×6): 40 mg via INTRAVENOUS
  Filled 2014-07-15 (×7): qty 1

## 2014-07-15 MED ORDER — OXYCODONE-ACETAMINOPHEN 5-325 MG PO TABS
1.0000 | ORAL_TABLET | Freq: Four times a day (QID) | ORAL | Status: DC | PRN
  Filled 2014-07-15: qty 1

## 2014-07-15 MED ORDER — TEMAZEPAM 15 MG PO CAPS
30.0000 mg | ORAL_CAPSULE | Freq: Every evening | ORAL | Status: DC | PRN
  Administered 2014-07-15 – 2014-07-16 (×3): 30 mg via ORAL
  Filled 2014-07-15 (×3): qty 2

## 2014-07-15 MED ORDER — PIPERACILLIN-TAZOBACTAM 3.375 G IVPB
3.3750 g | Freq: Three times a day (TID) | INTRAVENOUS | Status: DC
  Administered 2014-07-15 – 2014-07-16 (×4): 3.375 g via INTRAVENOUS
  Filled 2014-07-15 (×4): qty 50

## 2014-07-15 MED ORDER — IPRATROPIUM-ALBUTEROL 20-100 MCG/ACT IN AERS: 1.0000 | INHALATION_SPRAY | Freq: Four times a day (QID) | RESPIRATORY_TRACT | Status: DC

## 2014-07-15 MED ORDER — MIDODRINE HCL 2.5 MG PO TABS
2.5000 mg | ORAL_TABLET | Freq: Two times a day (BID) | ORAL | Status: DC
  Administered 2014-07-15 – 2014-07-17 (×5): 2.5 mg via ORAL
  Filled 2014-07-15 (×6): qty 1

## 2014-07-15 MED ORDER — BOOST PLUS PO LIQD
237.0000 mL | Freq: Four times a day (QID) | ORAL | Status: DC
  Administered 2014-07-15 – 2014-07-17 (×8): 237 mL via ORAL
  Filled 2014-07-15 (×13): qty 237

## 2014-07-15 MED ORDER — LEVALBUTEROL HCL 0.63 MG/3ML IN NEBU: 0.6300 mg | INHALATION_SOLUTION | RESPIRATORY_TRACT | Status: DC | PRN

## 2014-07-15 MED ORDER — SODIUM CHLORIDE 0.9 % IV SOLN
INTRAVENOUS | Status: DC
  Administered 2014-07-15 – 2014-07-17 (×2): via INTRAVENOUS

## 2014-07-15 MED ORDER — IPRATROPIUM-ALBUTEROL 0.5-2.5 (3) MG/3ML IN SOLN
3.0000 mL | Freq: Four times a day (QID) | RESPIRATORY_TRACT | Status: DC
  Administered 2014-07-15 – 2014-07-16 (×5): 3 mL via RESPIRATORY_TRACT
  Filled 2014-07-15 (×7): qty 3

## 2014-07-15 MED ORDER — INSULIN ASPART 100 UNIT/ML ~~LOC~~ SOLN
0.0000 [IU] | SUBCUTANEOUS | Status: DC
  Administered 2014-07-15: 3 [IU] via SUBCUTANEOUS
  Administered 2014-07-15: 2 [IU] via SUBCUTANEOUS
  Administered 2014-07-15: 3 [IU] via SUBCUTANEOUS
  Administered 2014-07-15: 1 [IU] via SUBCUTANEOUS
  Administered 2014-07-16: 2 [IU] via SUBCUTANEOUS
  Administered 2014-07-16 (×2): 1 [IU] via SUBCUTANEOUS
  Administered 2014-07-16 (×2): 2 [IU] via SUBCUTANEOUS
  Administered 2014-07-17: 1 [IU] via SUBCUTANEOUS

## 2014-07-15 MED ORDER — HYDROMORPHONE HCL 1 MG/ML IJ SOLN
1.0000 mg | INTRAMUSCULAR | Status: DC | PRN
  Administered 2014-07-15 – 2014-07-17 (×7): 1 mg via INTRAVENOUS
  Filled 2014-07-15 (×7): qty 1

## 2014-07-15 NOTE — Progress Notes (Signed)
  Echocardiogram 2D Echocardiogram has been performed.  Steve [name] M 07/15/2014, 10:58 AM

## 2014-07-15 NOTE — Evaluation (Signed)
Clinical/Bedside Swallow Evaluation Patient Details  Name: Steve [name] MRN: [id number] Date of Birth: 1940/11/13  Today's Date: 07/15/2014 Time: SLP Start Time (ACUTE ONLY): 1330 SLP Stop Time (ACUTE ONLY): 1408 SLP Time Calculation (min) (ACUTE ONLY): 38 min  Past Medical History:  Past Medical History  Diagnosis Date  . Dizziness and giddiness     positional vertigo  . Hyperlipidemia   . Impotence of organic origin   . Osteoarthritis, shoulder   . Allergy   . Hypothyroidism     pt denies thryoid disease, dr pandey note 11-19-12 says subclinical hypothryoid in epic  . COPD (chronic obstructive pulmonary disease)     pt denies  . Insomnia   . Hx of radiation therapy 02/2013    right lung  . Diabetes mellitus without complication     pt denies diabetes, dr pandey note says dm 11-19-12 epic  . 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 Malignant neoplasm of bladder, part unspecified   . Malignant neoplasm of bronchus and lung, unspecified site    Past Surgical History:  Past Surgical History  Procedure Laterality Date  . Cystoscopy w/ dilation of bladder    . Tonsillectomy  age 21  . Endobronchial ultrasound Bilateral 12/15/2012    Procedure: ENDOBRONCHIAL ULTRASOUND;  Surgeon: Brand Males, MD;  Location: [facility];  Service: Cardiopulmonary;  Laterality: Bilateral;  . Colonoscopy  2009    [facility]   HPI:  74 yo male adm to [facility] with shortness of breath and weakness- diagnosed with afib.  PMH + for stage 3A NSCLC s/p chemoradiation, dysphagia, malnutrition, weight loss.  Pt has undergone evaluation of swallowing (esophagram and EGD).  Pt EGD 07/01/14 with findings of extrinsic compression on esophagus likely due to lymphadenopathy, pt also with erosions, exudate.  It was recommended pt be on liquid diet and avoid NSAIDS, ASA that may irritate esophagus and take a PPI per GI.  Per dietician note, pt with poor appetite prior to admission and was consuming 4-5  Boosts daily.  Order received for swallow evaluation/reflux precautions.  MRI brain 12/915 negative for mets.     Assessment / Plan / Recommendation Clinical Impression  Pt with appearance of CN deficits impacting facial (*left facial/labial asymmetry) and vagus nerves (*dysphonia and palatal deviation to right upon phonation).  Pt reports progressive problems with vocal hoarseness since diagnosis.  MRI from 12/2013 negative for metastatic dx - ? source of CN deficits.   Note negative pharyngeal phase of swallow on esophagram 06/29/14.   As per ordered, SLP provided pt with mitigation strategies for his known lymphadenoapthy likely causing extrinsic compression on esophagus and esophageal erosions/exudate.     Pt reports consumption of pudding, cream of chicken today with good tolerance.  Dysphagia is intermittent in presentation per pt with pt inability to clear small amounts of water at times - pointing to mid-esophagus. During dysphagia exacerbations, pt reports liquid will not go down nor come up and he ceases intake at said time.    Reviewed mitigation strategies including xerostomia compensations, assuring rest breaks if sense stasis and consuming several small meals/day.  Advised pt to follow up with GI Md as OP re: possible dietary advancement - hopefully with decreased size of LAN with tx.    Advised pt to check with pharmacist re: alternatives for medication administration including crushing, liquids, etc.  Pt reports increase in viscocity of secretions and lack of improvement of reflux symptoms with use of Prilosec for  90 days- recommended he inform MD of these findings.    Provided all information re: dysphagia precautions in writing and verbally.  SLP to sign off as education completed and pt appears to be managing his dysphagia adequately.   Thanks for this consult.     Aspiration Risk    Moderate and ongoing due to pulmonary deficits and known esophageal dysphagia   Diet  Recommendation Thin (full liquids per MD order - recommended by GI Md after EGD 07/01/14)   Medication Administration: Crushed with puree Compensations: Slow rate;Small sips/bites (start intake with liquids, consume liquid to faciliate clearance, rest if sense stasis, stay up 30 minutes after meals)    Other  Recommendations Oral Care Recommendations: Oral care BID   Follow Up Recommendations    n/a   Frequency and Duration   n/a     Pertinent Vitals/Pain Low grade fever, decreased      Swallow Study Prior Functional Status   see [place] Date of Onset: 07/15/14 Other Pertinent Information: 74 yo male adm to [facility] with shortness of breath and weakness- diagnosed with afib.  PMH + for stage 3A NSCLC s/p chemoradiation, dysphagia, malnutrition, weight loss.  Pt has undergone evaluation of swallowing (esophagram and EGD).  Pt EGD 07/01/14 with findings of extrinsic compression on esophagus likely due to lymphadenopathy, pt also with erosions, exudate.  It was recommended pt be on liquid diet and avoid NSAIDS, ASA that may irritate esophagus and take a PPI per GI.  Per dietician note, pt with poor appetite prior to admission and was consuming 4-5 Boosts daily.  Order received for swallow evaluation/reflux precautions.  MRI brain 12/915 negative for mets.   Type of Study: Bedside swallow evaluation Diet Prior to this Study: Thin liquids (full liquids) Temperature Spikes Noted: No Respiratory Status: Room air Behavior/Cognition: Alert;Cooperative;Pleasant mood Oral Cavity - Dentition: Adequate natural dentition/normal for age Self-Feeding Abilities: Able to feed self Patient Positioning: Upright in bed Baseline Vocal Quality: Hoarse;Low vocal intensity Volitional Cough: Strong Volitional Swallow: Able to elicit    Oral/Motor/Sensory Function Overall Oral Motor/Sensory Function:  (uncertain if impaired at baseline) Labial Symmetry: Abnormal symmetry left Labial Strength: Within  Functional Limits Labial Sensation: Within Functional Limits Lingual ROM: Within Functional Limits Lingual Symmetry: Within Functional Limits Lingual Strength: Within Functional Limits Lingual Sensation: Within Functional Limits Facial Symmetry: Left droop (? left facial asymmetry) Velum: Impaired left (deviates to right upon phonation)   Ice Chips Ice chips: Not tested   Thin Liquid Thin Liquid: Within functional limits Presentation: Self Fed;Straw    Nectar Thick Nectar Thick Liquid: Impaired Pharyngeal Phase Impairments: Cough - Immediate Other Comments: immediate belching and cough noted, pt reports he took too large of an amount- smaller boluses tolerated better   Honey Thick Honey Thick Liquid: Not tested   Puree Puree: Not tested   Solid   GO    Solid: Not tested       Luanna Salk, [place] [facility] SLP [phone]

## 2014-07-15 NOTE — Progress Notes (Signed)
Date:  July 15, 2014 U.R. performed for needs and level of care. IV Cardizem for A.Fib with rvr Will continue to follow for Case Management needs.  Velva Harman, RN, BSN, [place]   [phone]

## 2014-07-15 NOTE — Consult Note (Signed)
Reason for Consult:   AF with RVR and dyspnea  Requesting Physician: CCM Primary Cardiologist Dr Harrington Challenger  HPI: This is a 74 y.o. male with a past medical history significant for lung cancer and PAF. He has had documented PAF and in Jan 2016 he had syncope with orthostatic B/P changes and rapid PSVT documented on holter monitor. Dr Harrington Challenger started him on Flecainide and Midodrine at that point and he has done well from a cardiac standpoint. He is not felt to be a candidate for anticoagulation secondary to his oncology issues.   PMHx:  Past Medical History  Diagnosis Date  . Dizziness and giddiness     positional vertigo  . Hyperlipidemia   . Impotence of organic origin   . Osteoarthritis, shoulder   . Allergy   . Hypothyroidism     pt denies thryoid disease, dr pandey note 11-19-12 says subclinical hypothryoid in epic  . COPD (chronic obstructive pulmonary disease)     pt denies  . Insomnia   . Hx of radiation therapy 02/2013    right lung  . Diabetes mellitus without complication     pt denies diabetes, dr pandey note says dm 11-19-12 epic  . 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 Malignant neoplasm of bladder, part unspecified   . Malignant neoplasm of bronchus and lung, unspecified site     Past Surgical History  Procedure Laterality Date  . Cystoscopy w/ dilation of bladder    . Tonsillectomy  age 51  . Endobronchial ultrasound Bilateral 12/15/2012    Procedure: ENDOBRONCHIAL ULTRASOUND;  Surgeon: Brand Males, MD;  Location: [facility];  Service: Cardiopulmonary;  Laterality: Bilateral;  . Colonoscopy  2009    [facility]    SOCHx:  reports that he quit smoking about 7 years ago. His smoking use included Cigarettes. He has a 75 pack-year smoking history. He quit smokeless tobacco use about 6 weeks ago. His smokeless tobacco use included Chew. He reports that he does not drink alcohol or use illicit drugs.  FAMHx: Family History    Problem Relation Age of Onset  . Kidney disease Brother   . Hypertension Mother   . Diabetes Mother   . Cancer Father     type unknown  . Stroke Father     ALLERGIES: Allergies  Allergen Reactions  . Morphine And Related Hives    ROS: Pertinent items are noted in HPI. see H&P for complete ROS No history of CAD, MI    HOME MEDICATIONS: Prior to Admission medications   Medication Sig Start Date End Date Taking? Authorizing Provider  flecainide (TAMBOCOR) 50 MG tablet Take 75 mg by mouth 2 (two) times daily. 06/24/14  Yes Historical Provider, MD  Ipratropium-Albuterol (COMBIVENT) 20-100 MCG/ACT AERS respimat Inhale 1 puff into the lungs every 6 (six) hours. 06/03/14  Yes Adrena E Johnson, PA-C  lidocaine (XYLOCAINE) 2 % solution Use as directed 20 mLs in the mouth or throat every 6 (six) hours as needed for mouth pain. 06/22/14  Yes Maryanna Shape, NP  metoprolol tartrate (LOPRESSOR) 25 MG tablet Take 1 tablet (25 mg total) by mouth 2 (two) times daily. 01/22/14  Yes Fay Records, MD  midodrine (PROAMATINE) 2.5 MG tablet Take 2.5 mg by mouth 2 (two) times daily with a meal.  04/20/14  Yes Historical Provider, MD  OVER THE COUNTER MEDICATION Take 2 tablets by mouth at bedtime. "Dulcolax with stool softener."  Yes Historical Provider, MD  oxyCODONE-acetaminophen (ROXICET) 5-325 MG per tablet Take one tablet by mouth every 6 hours as needed for pain 06/25/14  Yes Gildardo Cranker, DO  simvastatin (ZOCOR) 20 MG tablet Take one tablet by mouth once daily to lower cholesterol 10/21/13  Yes [name], MD  temazepam (RESTORIL) 30 MG capsule Take 1 capsule (30 mg total) by mouth at bedtime as needed for sleep. 07/02/14  Yes [name], MD  gabapentin (NEURONTIN) 300 MG capsule TAKE 1 CAPSULE BY MOUTH THREE TIMES DAILY Patient not taking: Reported on 07/14/2014 12/07/13   [name], MD    HOSPITAL MEDICATIONS: I have reviewed the patient's current medications.  VITALS: Blood  pressure 103/86, pulse 104, temperature 97.3 F (36.3 C), temperature source Oral, resp. rate 60, height '5\' 11"'$  (1.803 m), weight 167 lb 8.8 oz (76 kg), SpO2 98 %.  PHYSICAL EXAM: General appearance: alert, cooperative and no distress Neck: no carotid bruit and no JVD Lungs: decreased Rt base Heart: irregularly irregular rhythm Abdomen: soft, non-tender; bowel sounds normal; no masses,  no organomegaly Extremities: extremities normal, atraumatic, no cyanosis or edema Pulses: 2+ and symmetric Skin: Skin color, texture, turgor normal. No rashes or lesions Neurologic: Grossly normal  LABS: Results for orders placed or performed during the hospital encounter of 07/14/14 (from the past 24 hour(s))  Comprehensive metabolic panel     Status: Abnormal   Collection Time: 07/14/14  7:15 PM  Result Value Ref Range   Sodium 136 135 - 145 mmol/L   Potassium 4.3 3.5 - 5.1 mmol/L   Chloride 97 (L) 101 - 111 mmol/L   CO2 27 22 - 32 mmol/L   Glucose, Bld 158 (H) 65 - 99 mg/dL   BUN 23 (H) 6 - 20 mg/dL   Creatinine, Ser 0.91 0.61 - 1.24 mg/dL   Calcium 9.8 8.9 - 10.3 mg/dL   Total Protein 8.3 (H) 6.5 - 8.1 g/dL   Albumin 3.5 3.5 - 5.0 g/dL   AST 16 15 - 41 U/L   ALT 19 17 - 63 U/L   Alkaline Phosphatase 81 38 - 126 U/L   Total Bilirubin 0.6 0.3 - 1.2 mg/dL   GFR calc non Af Amer >60 >60 mL/min   GFR calc Af Amer >60 >60 mL/min   Anion gap 12 5 - 15  Troponin I     Status: None   Collection Time: 07/14/14  7:15 PM  Result Value Ref Range   Troponin I <0.03 <0.031 ng/mL  CBC with Differential     Status: Abnormal   Collection Time: 07/14/14  7:15 PM  Result Value Ref Range   WBC 20.0 (H) 4.0 - 10.5 K/uL   RBC 4.59 4.22 - 5.81 MIL/uL   Hemoglobin 13.8 13.0 - 17.0 g/dL   HCT 43.5 39.0 - 52.0 %   MCV 94.8 78.0 - 100.0 fL   MCH 30.1 26.0 - 34.0 pg   MCHC 31.7 30.0 - 36.0 g/dL   RDW 16.9 (H) 11.5 - 15.5 %   Platelets 602 (H) 150 - 400 K/uL   Neutrophils Relative % 81 (H) 43 - 77 %    Neutro Abs 16.3 (H) 1.7 - 7.7 K/uL   Lymphocytes Relative 6 (L) 12 - 46 %   Lymphs Abs 1.2 0.7 - 4.0 K/uL   Monocytes Relative 12 3 - 12 %   Monocytes Absolute 2.3 (H) 0.1 - 1.0 K/uL   Eosinophils Relative 1 0 - 5 %  Eosinophils Absolute 0.1 0.0 - 0.7 K/uL   Basophils Relative 0 0 - 1 %   Basophils Absolute 0.0 0.0 - 0.1 K/uL  Brain natriuretic peptide     Status: Abnormal   Collection Time: 07/14/14  7:16 PM  Result Value Ref Range   B Natriuretic Peptide 333.5 (H) 0.0 - 100.0 pg/mL  I-Stat CG4 Lactic Acid, ED     Status: None   Collection Time: 07/14/14  7:22 PM  Result Value Ref Range   Lactic Acid, Venous 1.91 0.5 - 2.0 mmol/L  MRSA PCR Screening     Status: None   Collection Time: 07/14/14 11:53 PM  Result Value Ref Range   MRSA by PCR NEGATIVE NEGATIVE  Comprehensive metabolic panel     Status: Abnormal   Collection Time: 07/15/14  2:06 AM  Result Value Ref Range   Sodium 135 135 - 145 mmol/L   Potassium 3.8 3.5 - 5.1 mmol/L   Chloride 101 101 - 111 mmol/L   CO2 27 22 - 32 mmol/L   Glucose, Bld 115 (H) 65 - 99 mg/dL   BUN 18 6 - 20 mg/dL   Creatinine, Ser 0.72 0.61 - 1.24 mg/dL   Calcium 8.8 (L) 8.9 - 10.3 mg/dL   Total Protein 7.0 6.5 - 8.1 g/dL   Albumin 3.0 (L) 3.5 - 5.0 g/dL   AST 17 15 - 41 U/L   ALT 19 17 - 63 U/L   Alkaline Phosphatase 75 38 - 126 U/L   Total Bilirubin 0.5 0.3 - 1.2 mg/dL   GFR calc non Af Amer >60 >60 mL/min   GFR calc Af Amer >60 >60 mL/min   Anion gap 7 5 - 15  Glucose, capillary     Status: Abnormal   Collection Time: 07/15/14  3:38 AM  Result Value Ref Range   Glucose-Capillary 130 (H) 65 - 99 mg/dL   Comment 1 Notify RN   Glucose, capillary     Status: Abnormal   Collection Time: 07/15/14  7:38 AM  Result Value Ref Range   Glucose-Capillary 140 (H) 65 - 99 mg/dL   Comment 1 Notify RN    Comment 2 Document in Chart   Procalcitonin - Baseline     Status: None   Collection Time: 07/15/14 11:00 AM  Result Value Ref Range    Procalcitonin <0.10 ng/mL  Glucose, capillary     Status: Abnormal   Collection Time: 07/15/14 11:21 AM  Result Value Ref Range   Glucose-Capillary 216 (H) 65 - 99 mg/dL    EKG: 07/14/14 AF with RVR  IMAGING: Ct Angio Chest Pe W/cm &/or Wo Cm  07/14/2014   CLINICAL DATA:  74 year old male with history of lung cancer and COPD complaining of shortness of breath.  EXAM: CT ANGIOGRAPHY CHEST WITH CONTRAST  TECHNIQUE: Multidetector CT imaging of the chest was performed using the standard protocol during bolus administration of intravenous contrast. Multiplanar CT image reconstructions and MIPs were obtained to evaluate the vascular anatomy.  CONTRAST:  184m OMNIPAQUE IOHEXOL 350 MG/ML SOLN  COMPARISON:  Chest radiograph dated 07/14/2014 and CT dated 05/14/2014.  FINDINGS: Mild emphysematous changes. There is stable appearing 7.0 x 4.5 cm right lower lobe mass with encasement of the right middle and right lower lobe bronchi and occlusion of the right lower lobe bronchus. There is associated postobstructive atelectasis versus pneumonia. Stable moderate right pleural effusion again noted. There is apparent extension of the mass into the right hilar and subcarinal region.  Mild  atherosclerotic calcification of the aorta. No CT evidence of pulmonary embolism. There is encasement and complete occlusion of the right lower lobe pulmonary artery branch by the lung mass. No cardiomegaly. There is coronary vascular calcification. Small pericardial effusion.  Right pectoral Port-A-Cath with tip in central SVC. Partially visualized subcentimeter left hepatic hypodense lesion, incompletely characterized. Degenerative changes of the spine. No acute fracture.  Review of the MIP images confirms the above findings.  IMPRESSION: No CT evidence of pulmonary embolism.  Stable appearing right lower lobe mass with encasement of the right lower lobe pulmonary arteries branch as well as right lower lobe bronchus. There is  postobstructive atelectasis/ pneumonia.  Grossly stable right pleural effusion.  Scratch   Electronically Signed   By: Anner Crete M.D.   On: 07/14/2014 22:45   Dg Chest Port 1 View  07/14/2014   CLINICAL DATA:  Worsening shortness of breath and chest pain. Currently on chemotherapy for right lower lobe lung carcinoma.  EXAM: PORTABLE CHEST - 1 VIEW  COMPARISON:  06/10/2014  FINDINGS: Right-sided power port remains in appropriate position. Right infrahilar mass like opacity shows no significant change allowing for differences in positioning. Pleural- parenchymal scarring at right lung base appears stable. No new areas of pulmonary opacity are identified. No evidence of pneumothorax or pleural effusion. Heart size is within normal limits.  IMPRESSION: No significant change in right infrahilar masslike opacity and right basilar scarring.   Electronically Signed   By: Earle Gell M.D.   On: 07/14/2014 19:33    IMPRESSION: Active Problems:   Atrial fibrillation with RVR   Dyspnea   HCAP (healthcare-associated pneumonia)   Uncontrolled secondary diabetes mellitus with complication   Primary cancer of right lower lobe of lung   PAF- on Flecainide,    Syncope   ILD (interstitial lung disease)   Malnutrition of moderate degree   Erosive esophagitis on recent endoscopy   History of bladder cancer   Orthostatic hypotension-on Midodrine   RECOMMENDATION: Will review anticoagulation with Dr Tamala Julian.  Amiodarone is not a good choice with history of ILD. Will increase Flecainide to 100 mg BID. His rate is currently controlled on IV Diltiazem.   Time Spent Directly with Patient: 45 minutes  Erlene Quan [phone] beeper 07/15/2014, 1:08 PM

## 2014-07-15 NOTE — Care Management Note (Signed)
Case Management Note  Patient Details  Name: [name] MRN: [id number] Date of Birth: [birth year]  Subjective/Objective:       A. fiv with rvr             Action/Plan: home   Expected Discharge Date:  07/19/14               Expected Discharge Plan:  Home/Self Care  In-House Referral:  NA  Discharge planning Services  CM Consult  Post Acute Care Choice:  NA, Home Health Choice offered to:  NA  DME Arranged:  N/A DME Agency:  NA  HH Arranged:  NA HH Agency:  NA  Status of Service:  In process, will continue to follow  Medicare Important Message Given:    Date Medicare IM Given:    Medicare IM give by:    Date Additional Medicare IM Given:    Additional Medicare Important Message give by:     If discussed at [place] of Stay Meetings, dates discussed:    Additional Comments:  Leeroy Cha, RN 07/15/2014, 10:26 AM

## 2014-07-15 NOTE — Consult Note (Signed)
Name: Steve [name] MRN: [id number] DOB: 12-21-1940    ADMISSION DATE:  07/14/2014 CONSULTATION DATE:7/7   REFERRING MD :  Karleen Hampshire   CHIEF COMPLAINT:  Dyspnea   BRIEF PATIENT DESCRIPTION:  This is a 74 year old male w/ stage IIIA NSCLC w/ symptomatic and radiographic evidence of disease progression in spite of concurrent chemoradiation as well as several rounds of consolidative chemo (see below). Admitted on 7/6 w/ CC: acute on chronic dyspnea, chest pain and odynophagia. PCCM asked to assess re: his dyspnea.    SIGNIFICANT EVENTS    STUDIES:  6/23 EGD (prior to admit): 1. 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 Limited exam due to the stricture CT chest 6/6: No CT evidence of pulmonary embolism.Stable appearing right lower lobe mass with encasement of the right lower lobe pulmonary arteries branch as well as right lower lobe bronchus. There is postobstructive atelectasis/ pneumonia. Grossly stable right pleural effusion.   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 Status post 3 cycles. Immunotherapy with Nivolumab 3 mg/KG every 2 weeks. First dose 09/21/2013. Status post 12 cycles. Currently Systemic chemotherapy with gemcitabine 1000 MG/M2 on days 1 and 8 every 3 weeks. First dose 03/30/2014. Status post 4 cycles; but has not had any treatment for last 6 weeks. In spite of these treatments has had CT evidence of disease progression. He was actually seen by Dr Chase Caller for evaluation of his dyspnea. He reports that since he started the Gemcitabine his dyspnea has become progressively  worse. He has also had significant chest pain associated w/ swallowing and odynophagia. He had recently undergone EGD by Dr Fuller Plan that demonstrated: Severe stricture w/ associated erosions and exudate at mid esophagus. It was described as extrinsic and felt to be d/t his lymphadenopathy. He notes several episodes of what sounds like presyncopal events. His dyspnea has continued to worsen to the point that he gets short of breath just walking 10 feet. He presents to the ER on 7/6 with above complaints. Denied Cough, did have exertional wheeze, no fever or chills, no sick exposures. On evaluation he was noted to be in AF w/ RVR. CT chest was obtained which was negative for pulmonary emboli and showed stable RLL mass, right effusion and left airway narrowing (unchanged from may exam). We were asked to evaluate in regards to his progressive dyspnea.   PAST MEDICAL HISTORY :   has a past medical history of Dizziness and giddiness; Hyperlipidemia; Impotence of organic origin; Osteoarthritis, shoulder; Allergy; Hypothyroidism; COPD (chronic obstructive pulmonary disease); Insomnia; radiation therapy (02/2013); Diabetes mellitus without complication; Peripheral neuropathy; Malignant neoplasm of bladder, part unspecified; and Malignant neoplasm of bronchus and lung, unspecified site.  has past surgical history that includes Cystoscopy w/ dilation of bladder; Tonsillectomy (age 60); Endobronchial ultrasound (Bilateral, 12/15/2012); and Colonoscopy (2009). Prior to Admission medications   Medication Sig Start Date End Date Taking? Authorizing Provider  flecainide (TAMBOCOR) 50 MG tablet Take 75 mg by mouth 2 (two) times daily. 06/24/14  Yes Historical Provider, MD  Ipratropium-Albuterol (COMBIVENT) 20-100 MCG/ACT AERS respimat Inhale 1 puff into the lungs every 6 (six) hours. 06/03/14  Yes  Carlton Adam, PA-C  lidocaine (XYLOCAINE) 2 % solution Use as directed 20 mLs in the mouth or throat every 6 (six) hours as needed  for mouth pain. 06/22/14  Yes Maryanna Shape, NP  metoprolol tartrate (LOPRESSOR) 25 MG tablet Take 1 tablet (25 mg total) by mouth 2 (two) times daily. 01/22/14  Yes Fay Records, MD  midodrine (PROAMATINE) 2.5 MG tablet Take 2.5 mg by mouth 2 (two) times daily with a meal.  04/20/14  Yes Historical Provider, MD  OVER THE COUNTER MEDICATION Take 2 tablets by mouth at bedtime. "Dulcolax with stool softener."   Yes Historical Provider, MD  oxyCODONE-acetaminophen (ROXICET) 5-325 MG per tablet Take one tablet by mouth every 6 hours as needed for pain 06/25/14  Yes Gildardo Cranker, DO  simvastatin (ZOCOR) 20 MG tablet Take one tablet by mouth once daily to lower cholesterol 10/21/13  Yes [name], MD  temazepam (RESTORIL) 30 MG capsule Take 1 capsule (30 mg total) by mouth at bedtime as needed for sleep. 07/02/14  Yes [name], MD  gabapentin (NEURONTIN) 300 MG capsule TAKE 1 CAPSULE BY MOUTH THREE TIMES DAILY Patient not taking: Reported on 07/14/2014 12/07/13   [name], MD   Allergies  Allergen Reactions  . Morphine And Related Hives    FAMILY HISTORY:  family history includes Cancer in his father; Diabetes in his mother; Hypertension in his mother; Kidney disease in his brother; Stroke in his father. SOCIAL HISTORY:  reports that he quit smoking about 7 years ago. His smoking use included Cigarettes. He has a 75 pack-year smoking history. He quit smokeless tobacco use about 6 weeks ago. His smokeless tobacco use included Chew. He reports that he does not drink alcohol or use illicit drugs.  REVIEW OF SYSTEMS:   Constitutional: Negative for fever, chills, weight loss, malaise/fatigue +  and diaphoresis.  HENT: Negative for hearing loss, ear pain, nosebleeds, congestion, sore throat, neck pain, tinnitus and ear discharge.   Eyes: Negative for blurred vision, double vision, photophobia, pain, discharge and redness.  Respiratory: Negative for cough, hemoptysis, sputum production,  shortness of breath, wheezing and stridor.  Some improvement w/ rescue SABA  Cardiovascular:  chest pain, palpitations, orthopnea, claudication, leg swelling and PND.  Gastrointestinal: Negative for heartburn, nausea, vomiting, abdominal pain, diarrhea, constipation, blood in stool and melena. ++ odynophagia w/ difficulty swallowing  Genitourinary: Negative for dysuria, urgency, frequency, hematuria and flank pain.  Musculoskeletal: Negative for myalgias, back pain, joint pain and falls.  Skin: Negative for itching and rash.  Neurological: Negative for dizziness, tingling, tremors, sensory change, speech change, focal weakness, seizures, loss of consciousness, weakness and headaches.  Endo/Heme/Allergies: Negative for environmental allergies and polydipsia. Does not bruise/bleed easily.  SUBJECTIVE:  Resting comfortably   VITAL SIGNS: Temp:  [97.6 F (36.4 C)-100.4 F (38 C)] 97.7 F (36.5 C) (07/07 0800) Pulse Rate:  [55-199] 114 (07/07 0842) Resp:  [13-37] 37 (07/07 0842) BP: (74-113)/(35-89) 110/68 mmHg (07/07 0842) SpO2:  [85 %-100 %] 100 % (07/07 0842) Weight:  [76 kg (167 lb 8.8 oz)] 76 kg (167 lb 8.8 oz) (07/06 2345)  PHYSICAL EXAMINATION: General:  74 year old [place] male, laying in bed. No distress  Neuro:  Awake, alert, no focal def  HEENT:  NCAT, no JVD  Cardiovascular:  irreg irreg AF w/ RVR on tele  Lungs:  Clear  Abdomen:  Soft, non-tender + bowel sounds  Musculoskeletal:  Intact  Skin:  Intact    Recent  Labs Lab 07/14/14 1915 07/15/14 0206  NA 136 135  K 4.3 3.8  CL 97* 101  CO2 27 27  BUN 23* 18  CREATININE 0.91 0.72  GLUCOSE 158* 115*    Recent Labs Lab 07/14/14 1915  HGB 13.8  HCT 43.5  WBC 20.0*  PLT 602*   Ct Angio Chest Pe W/cm &/or Wo Cm  07/14/2014   CLINICAL DATA:  74 year old male with history of lung cancer and COPD complaining of shortness of breath.  EXAM: CT ANGIOGRAPHY CHEST WITH CONTRAST  TECHNIQUE: Multidetector CT imaging of the  chest was performed using the standard protocol during bolus administration of intravenous contrast. Multiplanar CT image reconstructions and MIPs were obtained to evaluate the vascular anatomy.  CONTRAST:  136m OMNIPAQUE IOHEXOL 350 MG/ML SOLN  COMPARISON:  Chest radiograph dated 07/14/2014 and CT dated 05/14/2014.  FINDINGS: Mild emphysematous changes. There is stable appearing 7.0 x 4.5 cm right lower lobe mass with encasement of the right middle and right lower lobe bronchi and occlusion of the right lower lobe bronchus. There is associated postobstructive atelectasis versus pneumonia. Stable moderate right pleural effusion again noted. There is apparent extension of the mass into the right hilar and subcarinal region.  Mild atherosclerotic calcification of the aorta. No CT evidence of pulmonary embolism. There is encasement and complete occlusion of the right lower lobe pulmonary artery branch by the lung mass. No cardiomegaly. There is coronary vascular calcification. Small pericardial effusion.  Right pectoral Port-A-Cath with tip in central SVC. Partially visualized subcentimeter left hepatic hypodense lesion, incompletely characterized. Degenerative changes of the spine. No acute fracture.  Review of the MIP images confirms the above findings.  IMPRESSION: No CT evidence of pulmonary embolism.  Stable appearing right lower lobe mass with encasement of the right lower lobe pulmonary arteries branch as well as right lower lobe bronchus. There is postobstructive atelectasis/ pneumonia.  Grossly stable right pleural effusion.  Scratch   Electronically Signed   By: AAnner CreteM.D.   On: 07/14/2014 22:45   Dg Chest Port 1 View  07/14/2014   CLINICAL DATA:  Worsening shortness of breath and chest pain. Currently on chemotherapy for right lower lobe lung carcinoma.  EXAM: PORTABLE CHEST - 1 VIEW  COMPARISON:  06/10/2014  FINDINGS: Right-sided power port remains in appropriate position. Right infrahilar  mass like opacity shows no significant change allowing for differences in positioning. Pleural- parenchymal scarring at right lung base appears stable. No new areas of pulmonary opacity are identified. No evidence of pneumothorax or pleural effusion. Heart size is within normal limits.  IMPRESSION: No significant change in right infrahilar masslike opacity and right basilar scarring.   Electronically Signed   By: JEarle GellM.D.   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 Suspect that this is multifactorial. Likely his disease progression is the primary issue. Suspect that the chest pain, odynophagia and dysphagia are all due to the extrinsic compression of the esophagus leading also to a predisposition for aspiration. In regards to the dyspnea chronically suspect that this may be due to the worsening airway narrowing on the left, however suspect that more acutely his AF w/  RVR are responsible for much of his symptom burden. Not sure if the demand of disease progression was responsible for him going back into AF OR it was a response to the [name].   Plan Continue aggressive rate control measures (have consulted cardiology) seems as though he does not tolerate AF well Cont bronchodilators PRN (wil change to xopenex) SLP eval for reflux precautions Cont abx for now, might benefit from PCT to limit abx exposure Reasonable to keep his appointment at [facility] re: possible airway stent to LLL although not sure how much of an impact this will have on his symptoms.    Erick Colace ACNP-BC [place] Pager # [phone] OR # [phone] if no  answer   07/15/2014, 9:44 AM

## 2014-07-15 NOTE — Progress Notes (Signed)
[facility] PROGRESS NOTE  Steve [name] BTD:[id number] DOB: 08-May-1940 DOA: 07/14/2014 PCP: [name], MD  Assessment/Plan: 1. Acute on chronic respiratory failure: -probably from progression of disease in addition to HCAP.  Pulmonology consulted and recommendations given.  - plan for airway stent at [facility].  - IV steroids and bronchodilators as needed.    2. Paroxysmal atrial fibrillation: - probably brought on by his breathing issues.  - rate control with diltiazem.  - cardiology consulted and recommendations given.   3. Odynophagia: Speech eval recommending liquid diet.   4. Leukocytosis: Probably from the infection.    5. Primary lung cancer with mets: - further management as per his oncology.     Code Status: DNR Family Communication: Family at bedside Disposition Plan: pending.    Consultants:  Cardiology  pulmonoary.  Procedures:   Antibiotics:  Vancomycin  zosyn  HPI/Subjective: Wanted liquid pain meds  Objective: Filed Vitals:   07/15/14 1600  BP:   Pulse:   Temp: 97.6 F (36.4 C)  Resp:     Intake/Output Summary (Last 24 hours) at 07/15/14 1720 Last data filed at 07/15/14 1500  Gross per 24 hour  Intake 1907.2 ml  Output    550 ml  Net 1357.2 ml   Filed Weights   07/14/14 2345  Weight: 76 kg (167 lb 8.8 oz)    Exam:   General:  Alert afebrile comfortable.   Cardiovascular: s1s2  Respiratory: diminished at bases, scattered wheezing heard.   Abdomen: soft NT ND BS+  Musculoskeletal: trace pedal edema.   Data Reviewed: Basic Metabolic Panel:  Recent Labs Lab 07/14/14 1915 07/15/14 0206 07/15/14 1546  NA 136 135  --   K 4.3 3.8  --   CL 97* 101  --   CO2 27 27  --   GLUCOSE 158* 115*  --   BUN 23* 18  --   CREATININE 0.91 0.72  --   CALCIUM 9.8 8.8*  --   MG  --   --  1.9  PHOS  --   --  2.4*   Liver Function Tests:  Recent Labs Lab 07/14/14 1915 07/15/14 0206  AST 16 17  ALT 19 19   ALKPHOS 81 75  BILITOT 0.6 0.5  PROT 8.3* 7.0  ALBUMIN 3.5 3.0*   No results for input(s): LIPASE, AMYLASE in the last 168 hours. No results for input(s): AMMONIA in the last 168 hours. CBC:  Recent Labs Lab 07/14/14 1915  WBC 20.0*  NEUTROABS 16.3*  HGB 13.8  HCT 43.5  MCV 94.8  PLT 602*   Cardiac Enzymes:  Recent Labs Lab 07/14/14 1915  TROPONINI <0.03   BNP (last 3 results)  Recent Labs  07/14/14 1916  BNP 333.5*    ProBNP (last 3 results) No results for input(s): PROBNP in the last 8760 hours.  CBG:  Recent Labs Lab 07/15/14 0338 07/15/14 0738 07/15/14 1121 07/15/14 1601  GLUCAP 130* 140* 216* 196*    Recent Results (from the past 240 hour(s))  MRSA PCR Screening     Status: None   Collection Time: 07/14/14 11:53 PM  Result Value Ref Range Status   MRSA by PCR NEGATIVE NEGATIVE Final    Comment:        The GeneXpert MRSA Assay (FDA approved for NASAL specimens only), is one component of a comprehensive MRSA colonization surveillance program. It is not intended to diagnose MRSA infection nor to guide or monitor treatment for MRSA infections.  Studies: Ct Angio Chest Pe W/cm &/or Wo Cm  07/14/2014   CLINICAL DATA:  74 year old male with history of lung cancer and COPD complaining of shortness of breath.  EXAM: CT ANGIOGRAPHY CHEST WITH CONTRAST  TECHNIQUE: Multidetector CT imaging of the chest was performed using the standard protocol during bolus administration of intravenous contrast. Multiplanar CT image reconstructions and MIPs were obtained to evaluate the vascular anatomy.  CONTRAST:  160m OMNIPAQUE IOHEXOL 350 MG/ML SOLN  COMPARISON:  Chest radiograph dated 07/14/2014 and CT dated 05/14/2014.  FINDINGS: Mild emphysematous changes. There is stable appearing 7.0 x 4.5 cm right lower lobe mass with encasement of the right middle and right lower lobe bronchi and occlusion of the right lower lobe bronchus. There is associated  postobstructive atelectasis versus pneumonia. Stable moderate right pleural effusion again noted. There is apparent extension of the mass into the right hilar and subcarinal region.  Mild atherosclerotic calcification of the aorta. No CT evidence of pulmonary embolism. There is encasement and complete occlusion of the right lower lobe pulmonary artery branch by the lung mass. No cardiomegaly. There is coronary vascular calcification. Small pericardial effusion.  Right pectoral Port-A-Cath with tip in central SVC. Partially visualized subcentimeter left hepatic hypodense lesion, incompletely characterized. Degenerative changes of the spine. No acute fracture.  Review of the MIP images confirms the above findings.  IMPRESSION: No CT evidence of pulmonary embolism.  Stable appearing right lower lobe mass with encasement of the right lower lobe pulmonary arteries branch as well as right lower lobe bronchus. There is postobstructive atelectasis/ pneumonia.  Grossly stable right pleural effusion.  Scratch   Electronically Signed   By: AAnner CreteM.D.   On: 07/14/2014 22:45   Dg Chest Port 1 View  07/14/2014   CLINICAL DATA:  Worsening shortness of breath and chest pain. Currently on chemotherapy for right lower lobe lung carcinoma.  EXAM: PORTABLE CHEST - 1 VIEW  COMPARISON:  06/10/2014  FINDINGS: Right-sided power port remains in appropriate position. Right infrahilar mass like opacity shows no significant change allowing for differences in positioning. Pleural- parenchymal scarring at right lung base appears stable. No new areas of pulmonary opacity are identified. No evidence of pneumothorax or pleural effusion. Heart size is within normal limits.  IMPRESSION: No significant change in right infrahilar masslike opacity and right basilar scarring.   Electronically Signed   By: JEarle GellM.D.   On: 07/14/2014 19:33    Scheduled Meds: . flecainide  75 mg Oral BID  . insulin aspart  0-9 Units Subcutaneous 6  times per day  . ipratropium-albuterol  3 mL Nebulization Q6H  . lactose free nutrition  237 mL Oral QID  . methylPREDNISolone (SOLU-MEDROL) injection  40 mg Intravenous 4 times per day  . metoprolol tartrate  25 mg Oral BID  . midodrine  2.5 mg Oral BID WC  . piperacillin-tazobactam (ZOSYN)  IV  3.375 g Intravenous 3 times per day  . simvastatin  20 mg Oral q1800  . vancomycin  1,000 mg Intravenous BID   Continuous Infusions: . sodium chloride 75 mL/hr at 07/15/14 1248  . diltiazem (CARDIZEM) infusion 7.5 mg/hr (07/15/14 0956)    Active Problems:   Uncontrolled secondary diabetes mellitus with complication   Primary cancer of right lower lobe of lung   History of bladder cancer   PAF- on Flecainide,    Syncope   ILD (interstitial lung disease)   Atrial fibrillation with RVR   Dyspnea   HCAP (healthcare-associated pneumonia)  Malnutrition of moderate degree   Orthostatic hypotension-on Midodrine   Erosive esophagitis on recent endoscopy    Time spent: 30 minutes.     [facility] [facility] Pager [phone]. If 7PM-7AM, please contact night-coverage at [email or web address], password [facility] 07/15/2014, 5:20 PM  LOS: 1 day

## 2014-07-15 NOTE — Consult Note (Addendum)
See full consult.

## 2014-07-15 NOTE — Progress Notes (Signed)
Initial Nutrition Assessment  DOCUMENTATION CODES:  Non-severe (moderate) malnutrition in context of chronic illness  INTERVENTION: - Will order Boost Plus QID, each supplement provides 360 kcal and 14 grams of protein - RD will continue to monitor for needs  NUTRITION DIAGNOSIS:  Increased nutrient needs related to cancer and cancer related treatments as evidenced by estimated needs.  GOAL:  Patient will meet greater than or equal to 90% of their needs  MONITOR:  PO intake, Supplement acceptance, Weight trends, Labs  REASON FOR ASSESSMENT:  Malnutrition Screening Tool  ASSESSMENT: 74 y.o. male has a past medical history of Dizziness and giddiness; Hyperlipidemia; Impotence of organic origin; Osteoarthritis, shoulder; Allergy; Hypothyroidism; COPD (chronic obstructive pulmonary disease); Insomnia; radiation therapy (02/2013); Diabetes mellitus without complication; Peripheral neuropathy; Malignant neoplasm of bladder, part unspecified; and Malignant neoplasm of bronchus and lung, unspecified site. Presented with worsening dyspnea worse from months but have been getting gradually especially after last chemo.  Pt seen for MST. BMI indicates normal weight status. Pt ate cup of pudding and bowl of cream of chicken for lunch (100% completion) he reports good tolerance of this and that it tasted good to him. He indicates that PTA he had poor appetite but was drinking 4-5 Boost at times. He was mainly consuming soft, easy to swallow items such as applesauce, smoothies, and yogurt.  He indicates that with radiation he experienced taste alteration and that since starting chemo this has intensified. Foods taste overly salty to him and he is experiencing metallic taste in the mouth.  Pt reports 10 lb weight loss in the past 3 months. Per chart review, he has lost 10 lbs (6% body weight) in the past 1 month which is significant for time frame. Mild to moderate muscle and fat wasting also noted.     Not meeting needs. Medications reviewed. Labs reviewed; CBGs: 130-216 mg/dL.  Height:  Ht Readings from Last 1 Encounters:  07/14/14 '5\' 11"'$  (1.803 m)    Weight:  Wt Readings from Last 1 Encounters:  07/14/14 167 lb 8.8 oz (76 kg)    Ideal Body Weight:  78.2 kg (kg)  Wt Readings from Last 10 Encounters:  07/14/14 167 lb 8.8 oz (76 kg)  07/06/14 171 lb 4.8 oz (77.701 kg)  07/01/14 174 lb (78.926 kg)  06/28/14 174 lb 4 oz (79.039 kg)  06/22/14 176 lb 11.2 oz (80.151 kg)  06/14/14 177 lb (80.287 kg)  06/10/14 178 lb (80.74 kg)  06/01/14 179 lb (81.194 kg)  05/11/14 177 lb 12.8 oz (80.65 kg)  05/10/14 179 lb 3.2 oz (81.285 kg)    BMI:  Body mass index is 23.38 kg/(m^2).  Estimated Nutritional Needs:  Kcal:  2200-2400  Protein:  90-100 grams  Fluid:  2.2-2.5 L/day  Skin:  Reviewed, no issues  Diet Order:  Diet full liquid Room service appropriate?: Yes; Fluid consistency:: Thin  EDUCATION NEEDS:  No education needs identified at this time   Intake/Output Summary (Last 24 hours) at 07/15/14 1233 Last data filed at 07/15/14 1200  Gross per 24 hour  Intake 1632.2 ml  Output    550 ml  Net 1082.2 ml    Last BM:  PTA     Jarome Matin, RD, LDN Inpatient Clinical Dietitian Pager # [phone] After hours/weekend pager # [phone]

## 2014-07-16 DIAGNOSIS — K208 Other esophagitis: Secondary | ICD-10-CM

## 2014-07-16 LAB — CBC
HCT: 29.9 % — ABNORMAL LOW (ref 39.0–52.0)
HEMOGLOBIN: 9.7 g/dL — AB (ref 13.0–17.0)
MCH: 30.3 pg (ref 26.0–34.0)
MCHC: 32.4 g/dL (ref 30.0–36.0)
MCV: 93.4 fL (ref 78.0–100.0)
Platelets: 454 10*3/uL — ABNORMAL HIGH (ref 150–400)
RBC: 3.2 MIL/uL — AB (ref 4.22–5.81)
RDW: 16.6 % — ABNORMAL HIGH (ref 11.5–15.5)
WBC: 16.1 10*3/uL — AB (ref 4.0–10.5)

## 2014-07-16 LAB — GLUCOSE, CAPILLARY
GLUCOSE-CAPILLARY: 111 mg/dL — AB (ref 65–99)
GLUCOSE-CAPILLARY: 149 mg/dL — AB (ref 65–99)
GLUCOSE-CAPILLARY: 153 mg/dL — AB (ref 65–99)
Glucose-Capillary: 136 mg/dL — ABNORMAL HIGH (ref 65–99)
Glucose-Capillary: 167 mg/dL — ABNORMAL HIGH (ref 65–99)
Glucose-Capillary: 179 mg/dL — ABNORMAL HIGH (ref 65–99)

## 2014-07-16 LAB — BASIC METABOLIC PANEL
ANION GAP: 6 (ref 5–15)
BUN: 17 mg/dL (ref 6–20)
CHLORIDE: 104 mmol/L (ref 101–111)
CO2: 25 mmol/L (ref 22–32)
Calcium: 8.9 mg/dL (ref 8.9–10.3)
Creatinine, Ser: 0.62 mg/dL (ref 0.61–1.24)
GFR calc Af Amer: >60 mL/min (ref >60)
GFR calc non Af Amer: >60 mL/min (ref >60)
Glucose, Bld: 148 mg/dL — ABNORMAL HIGH (ref 65–99)
Potassium: 3.9 mmol/L (ref 3.5–5.1)
SODIUM: 135 mmol/L (ref 135–145)

## 2014-07-16 LAB — HEMOGLOBIN A1C
Hgb A1c MFr Bld: 5.9 % — ABNORMAL HIGH (ref 4.8–5.6)
Mean Plasma Glucose: 123 mg/dL

## 2014-07-16 LAB — LEGIONELLA ANTIGEN, URINE

## 2014-07-16 MED ORDER — FLECAINIDE ACETATE 100 MG PO TABS
100.0000 mg | ORAL_TABLET | Freq: Two times a day (BID) | ORAL | Status: DC
  Administered 2014-07-16 – 2014-07-17 (×2): 100 mg via ORAL
  Filled 2014-07-16 (×2): qty 1

## 2014-07-16 MED ORDER — PANTOPRAZOLE SODIUM 40 MG PO TBEC
40.0000 mg | DELAYED_RELEASE_TABLET | Freq: Every day | ORAL | Status: DC
  Filled 2014-07-16: qty 1

## 2014-07-16 MED ORDER — AMOXICILLIN-POT CLAVULANATE 400-57 MG/5ML PO SUSR
800.0000 mg | Freq: Two times a day (BID) | ORAL | Status: DC
  Administered 2014-07-16 – 2014-07-17 (×3): 800 mg via ORAL
  Filled 2014-07-16 (×4): qty 10

## 2014-07-16 MED ORDER — PANTOPRAZOLE SODIUM 40 MG PO PACK
80.0000 mg | PACK | Freq: Every day | ORAL | Status: DC
  Administered 2014-07-16 – 2014-07-17 (×2): 80 mg
  Filled 2014-07-16 (×3): qty 40

## 2014-07-16 MED ORDER — SENNOSIDES-DOCUSATE SODIUM 8.6-50 MG PO TABS
2.0000 | ORAL_TABLET | Freq: Two times a day (BID) | ORAL | Status: DC
  Administered 2014-07-16 – 2014-07-17 (×3): 2 via ORAL
  Filled 2014-07-16 (×3): qty 2

## 2014-07-16 MED ORDER — OMEPRAZOLE 2 MG/ML ORAL SUSPENSION: 40.0000 mg | Freq: Every day | ORAL | Status: DC

## 2014-07-16 MED ORDER — POTASSIUM PHOSPHATES 15 MMOLE/5ML IV SOLN
20.0000 mmol | Freq: Once | INTRAVENOUS | Status: AC
  Administered 2014-07-16: 20 mmol via INTRAVENOUS
  Filled 2014-07-16: qty 6.67

## 2014-07-16 NOTE — Progress Notes (Signed)
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 Admitted on 7/6 w/ CC: acute on chronic dyspnea, chest pain and odynophagia. PCCM asked to assess re: his dyspnea.    SIGNIFICANT EVENTS    STUDIES:  6/23 EGD (prior to admit): 1. 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 Limited exam due to the stricture CT chest 6/6: No CT evidence of pulmonary embolism.Stable appearing right lower lobe mass with encasement of the right lower lobe pulmonary arteries branch as well as right lower lobe bronchus. There is postobstructive atelectasis/ pneumonia. Grossly stable right pleural effusion.  SUBJECTIVE:  Resting comfortably, feels a little better   VITAL SIGNS: Temp:  [97.3 F (36.3 C)-97.6 F (36.4 C)] 97.6 F (36.4 C) (07/08 0749) Pulse Rate:  [57-127] 68 (07/08 0900) Resp:  [12-71] 31 (07/08 0900) BP: (81-133)/(33-86) 133/66 mmHg (07/08 0900) SpO2:  [95 %-100 %] 98 % (07/08 0900) Room air   PHYSICAL EXAMINATION: General:  74 year old [place] male, laying in bed. No distress  Neuro:  Awake, alert, no focal def  HEENT:  NCAT, no JVD  Cardiovascular:  irreg irreg AF w/ RVR on tele  Lungs: a little more rhonchus on left  Abdomen:  Soft, non-tender + bowel sounds  Musculoskeletal:  Intact  Skin:  Intact    Recent Labs Lab 07/14/14 1915 07/15/14 0206 07/16/14 0435  NA 136 135 135  K 4.3 3.8 3.9  CL 97* 101 104  CO2 '27 27 25  '$ BUN 23* 18 17  CREATININE 0.91 0.72 0.62  GLUCOSE 158* 115* 148*    Recent Labs Lab 07/14/14 1915 07/16/14 0435  HGB 13.8 9.7*    HCT 43.5 29.9*  WBC 20.0* 16.1*  PLT 602* 454*    Recent Labs Lab 07/14/14 1915 07/14/14 1922 07/15/14 1100 07/16/14 0435  PROCALCITON  --   --  <0.10  --   WBC 20.0*  --   --  16.1*  LATICACIDVEN  --  1.91  --   --       Ct Angio Chest Pe W/cm &/or Wo Cm  07/14/2014   CLINICAL DATA:  74 year old male with history of lung cancer and COPD complaining of shortness of breath.  EXAM: CT ANGIOGRAPHY CHEST WITH CONTRAST  TECHNIQUE: Multidetector CT imaging of the chest was performed using the standard protocol during bolus administration of intravenous contrast. Multiplanar CT image reconstructions and MIPs were obtained to evaluate the vascular anatomy.  CONTRAST:  170m OMNIPAQUE IOHEXOL 350 MG/ML SOLN  COMPARISON:  Chest radiograph dated 07/14/2014 and CT dated 05/14/2014.  FINDINGS: Mild emphysematous changes. There is stable appearing 7.0 x 4.5 cm right lower lobe mass with encasement of the right middle and right lower lobe bronchi and occlusion of the right lower lobe bronchus. There is associated postobstructive atelectasis versus pneumonia. Stable moderate right pleural effusion again noted. There is apparent extension of the mass into the right hilar and subcarinal region.  Mild atherosclerotic calcification of the aorta. No CT evidence of pulmonary embolism. There is encasement and complete occlusion of the right lower lobe pulmonary artery branch by the  lung mass. No cardiomegaly. There is coronary vascular calcification. Small pericardial effusion.  Right pectoral Port-A-Cath with tip in central SVC. Partially visualized subcentimeter left hepatic hypodense lesion, incompletely characterized. Degenerative changes of the spine. No acute fracture.  Review of the MIP images confirms the above findings.  IMPRESSION: No CT evidence of pulmonary embolism.  Stable appearing right lower lobe mass with encasement of the right lower lobe pulmonary arteries branch as well as right lower lobe  bronchus. There is postobstructive atelectasis/ pneumonia.  Grossly stable right pleural effusion.  Scratch   Electronically Signed   By: Anner Crete M.D.   On: 07/14/2014 22:45   Dg Chest Port 1 View  07/14/2014   CLINICAL DATA:  Worsening shortness of breath and chest pain. Currently on chemotherapy for right lower lobe lung carcinoma.  EXAM: PORTABLE CHEST - 1 VIEW  COMPARISON:  06/10/2014  FINDINGS: Right-sided power port remains in appropriate position. Right infrahilar mass like opacity shows no significant change allowing for differences in positioning. Pleural- parenchymal scarring at right lung base appears stable. No new areas of pulmonary opacity are identified. No evidence of pneumothorax or pleural effusion. Heart size is within normal limits.  IMPRESSION: No significant change in right infrahilar masslike opacity and right basilar scarring.   Electronically Signed   By: Earle Gell M.D.   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 Suspect that this is multifactorial. Likely his disease progression is the primary issue. Suspect that the chest pain, odynophagia and dysphagia are all due to the extrinsic compression of the esophagus leading also to a predisposition for aspiration. In regards to the dyspnea chronically suspect that this may be due to the worsening airway narrowing on the left, however suspect that more acutely his AF w/ RVR are responsible for much of his symptom burden. Not sure if the demand of disease progression was responsible for him going back into AF OR it was a response to the [name]. He is now in NSR and feels better but has  yet to exert himself which will be the true test of success here.    Plan Cont current AF regimen as ordered by cards Cont bronchodilators PRN (wil change to xopenex) cont reflux precautions Dc vanc, change zosyn to augmentin  Dc steroids  Ambulate to see activity tol now that HR back in NSR  Reasonable to keep his appointment at [facility] re: possible airway stent to LLL although not sure how much of an impact this will have on his symptoms.  He can be transferred to tele  We will s/o call PRN   Erick Colace ACNP-BC [place] Pager # [phone] OR # [phone] if no answer   07/16/2014, 9:28 AM

## 2014-07-16 NOTE — Progress Notes (Signed)
[facility] PROGRESS NOTE  Steve [name] XBW:[id number] DOB: [birth year] DOA: 07/14/2014 PCP: [name], MD  Assessment/Plan: 1. Acute on chronic respiratory failure: -probably from progression of disease in addition to HCAP. Transitioned to oral antibiotics by PCCM.  Pulmonology consulted and recommendations given.  - plan for airway stent at [facility].     2. Paroxysmal atrial fibrillation: - probably brought on by his breathing issues.  - Converted to sinus. Anticoagulation as per his primary cardio and oncology.  - cardiology consulted and recommendations given.   3. Odynophagia: Speech eval recommending liquid diet.   4. Leukocytosis: Probably from the infection.    5. Primary lung cancer with mets: - further management as per his oncology.     Code Status: DNR Family Communication: Family at bedside Disposition Plan: transfer to telemetry.  Consultants:  Cardiology  pulmonoary.  Procedures:   Antibiotics:  Vancomycin  Zosyn   augmentin started on 7/8  HPI/Subjective: Wanted liquid pain meds. Feeling better.   Objective: Filed Vitals:   07/16/14 1235  BP: 122/69  Pulse: 74  Temp: 97.4 F (36.3 C)  Resp: 16    Intake/Output Summary (Last 24 hours) at 07/16/14 1742 Last data filed at 07/16/14 1713  Gross per 24 hour  Intake 2947.92 ml  Output   1200 ml  Net 1747.92 ml   Filed Weights   07/14/14 2345 07/16/14 1235  Weight: 76 kg (167 lb 8.8 oz) 78.5 kg (173 lb 1 oz)    Exam:   General:  Alert afebrile comfortable.   Cardiovascular: s1s2  Respiratory: diminished at bases, no wheezing heard.   Abdomen: soft NT ND BS+  Musculoskeletal: trace pedal edema.   Data Reviewed: Basic Metabolic Panel:  Recent Labs Lab 07/14/14 1915 07/15/14 0206 07/15/14 1546 07/16/14 0435  NA 136 135  --  135  K 4.3 3.8  --  3.9  CL 97* 101  --  104  CO2 27 27  --  25  GLUCOSE 158* 115*  --  148*  BUN 23* 18  --  17  CREATININE 0.91  0.72  --  0.62  CALCIUM 9.8 8.8*  --  8.9  MG  --   --  1.9  --   PHOS  --   --  2.4*  --    Liver Function Tests:  Recent Labs Lab 07/14/14 1915 07/15/14 0206  AST 16 17  ALT 19 19  ALKPHOS 81 75  BILITOT 0.6 0.5  PROT 8.3* 7.0  ALBUMIN 3.5 3.0*   No results for input(s): LIPASE, AMYLASE in the last 168 hours. No results for input(s): AMMONIA in the last 168 hours. CBC:  Recent Labs Lab 07/14/14 1915 07/16/14 0435  WBC 20.0* 16.1*  NEUTROABS 16.3*  --   HGB 13.8 9.7*  HCT 43.5 29.9*  MCV 94.8 93.4  PLT 602* 454*   Cardiac Enzymes:  Recent Labs Lab 07/14/14 1915  TROPONINI <0.03   BNP (last 3 results)  Recent Labs  07/14/14 1916  BNP 333.5*    ProBNP (last 3 results) No results for input(s): PROBNP in the last 8760 hours.  CBG:  Recent Labs Lab 07/15/14 1945 07/15/14 2352 07/16/14 0434 07/16/14 0739 07/16/14 1151  GLUCAP 202* 149* 136* 167* 179*    Recent Results (from the past 240 hour(s))  Blood culture (routine x 2)     Status: None (Preliminary result)   Collection Time: 07/14/14  7:39 PM  Result Value Ref Range Status  Specimen Description BLOOD PORT A CATH  Final   Special Requests BOTTLES DRAWN AEROBIC AND ANAEROBIC 5CC  Final   Culture   Final    NO GROWTH 1 DAY Performed at [facility]    Report Status PENDING  Incomplete  Blood culture (routine x 2)     Status: None (Preliminary result)   Collection Time: 07/14/14  8:39 PM  Result Value Ref Range Status   Specimen Description BLOOD RIGHT HAND  Final   Special Requests BOTTLES DRAWN AEROBIC ONLY 3ML  Final   Culture   Final    NO GROWTH 1 DAY Performed at [facility]    Report Status PENDING  Incomplete  MRSA PCR Screening     Status: None   Collection Time: 07/14/14 11:53 PM  Result Value Ref Range Status   MRSA by PCR NEGATIVE NEGATIVE Final    Comment:        The GeneXpert MRSA Assay (FDA approved for NASAL specimens only), is one component of  a comprehensive MRSA colonization surveillance program. It is not intended to diagnose MRSA infection nor to guide or monitor treatment for MRSA infections.      Studies: Ct Angio Chest Pe W/cm &/or Wo Cm  07/14/2014   CLINICAL DATA:  74 year old male with history of lung cancer and COPD complaining of shortness of breath.  EXAM: CT ANGIOGRAPHY CHEST WITH CONTRAST  TECHNIQUE: Multidetector CT imaging of the chest was performed using the standard protocol during bolus administration of intravenous contrast. Multiplanar CT image reconstructions and MIPs were obtained to evaluate the vascular anatomy.  CONTRAST:  156m OMNIPAQUE IOHEXOL 350 MG/ML SOLN  COMPARISON:  Chest radiograph dated 07/14/2014 and CT dated 05/14/2014.  FINDINGS: Mild emphysematous changes. There is stable appearing 7.0 x 4.5 cm right lower lobe mass with encasement of the right middle and right lower lobe bronchi and occlusion of the right lower lobe bronchus. There is associated postobstructive atelectasis versus pneumonia. Stable moderate right pleural effusion again noted. There is apparent extension of the mass into the right hilar and subcarinal region.  Mild atherosclerotic calcification of the aorta. No CT evidence of pulmonary embolism. There is encasement and complete occlusion of the right lower lobe pulmonary artery branch by the lung mass. No cardiomegaly. There is coronary vascular calcification. Small pericardial effusion.  Right pectoral Port-A-Cath with tip in central SVC. Partially visualized subcentimeter left hepatic hypodense lesion, incompletely characterized. Degenerative changes of the spine. No acute fracture.  Review of the MIP images confirms the above findings.  IMPRESSION: No CT evidence of pulmonary embolism.  Stable appearing right lower lobe mass with encasement of the right lower lobe pulmonary arteries branch as well as right lower lobe bronchus. There is postobstructive atelectasis/ pneumonia.  Grossly  stable right pleural effusion.  Scratch   Electronically Signed   By: AAnner CreteM.D.   On: 07/14/2014 22:45   Dg Chest Port 1 View  07/14/2014   CLINICAL DATA:  Worsening shortness of breath and chest pain. Currently on chemotherapy for right lower lobe lung carcinoma.  EXAM: PORTABLE CHEST - 1 VIEW  COMPARISON:  06/10/2014  FINDINGS: Right-sided power port remains in appropriate position. Right infrahilar mass like opacity shows no significant change allowing for differences in positioning. Pleural- parenchymal scarring at right lung base appears stable. No new areas of pulmonary opacity are identified. No evidence of pneumothorax or pleural effusion. Heart size is within normal limits.  IMPRESSION: No significant change in right infrahilar  masslike opacity and right basilar scarring.   Electronically Signed   By: Earle Gell M.D.   On: 07/14/2014 19:33    Scheduled Meds: . amoxicillin-clavulanate  800 mg of amoxicillin Oral Q12H  . flecainide  100 mg Oral BID  . insulin aspart  0-9 Units Subcutaneous 6 times per day  . ipratropium-albuterol  3 mL Nebulization Q6H  . lactose free nutrition  237 mL Oral QID  . metoprolol tartrate  25 mg Oral BID  . midodrine  2.5 mg Oral BID WC  . pantoprazole sodium  80 mg Per Tube Daily  . senna-docusate  2 tablet Oral BID  . simvastatin  20 mg Oral q1800   Continuous Infusions: . sodium chloride 10 mL/hr at 07/16/14 1111    Active Problems:   Uncontrolled secondary diabetes mellitus with complication   Primary cancer of right lower lobe of lung   History of bladder cancer   PAF- on Flecainide,    Syncope   ILD (interstitial lung disease)   Atrial fibrillation with RVR   Dyspnea   HCAP (healthcare-associated pneumonia)   Malnutrition of moderate degree   Orthostatic hypotension-on Midodrine   Erosive esophagitis on recent endoscopy    Time spent: 30 minutes.     [facility] [facility] Pager [phone]. If 7PM-7AM, please  contact night-coverage at [email or web address], password [facility] 07/16/2014, 5:42 PM  LOS: 2 days

## 2014-07-16 NOTE — Progress Notes (Signed)
Subjective:  No complaints this am, he has not been out of bed yet  Objective:  Vital Signs in the last 24 hours: Temp:  [97.3 F (36.3 C)-97.6 F (36.4 C)] 97.6 F (36.4 C) (07/08 0749) Pulse Rate:  [57-127] 89 (07/08 0800) Resp:  [12-71] 19 (07/08 0800) BP: (81-124)/(33-86) 124/68 mmHg (07/08 0800) SpO2:  [95 %-100 %] 97 % (07/08 0800)  Intake/Output from previous day:  Intake/Output Summary (Last 24 hours) at 07/16/14 0913 Last data filed at 07/16/14 0517  Gross per 24 hour  Intake 2257.2 ml  Output    950 ml  Net 1307.2 ml    Physical Exam: General appearance: alert, cooperative and no distress Neck: no carotid bruit and no JVD Lungs: clear to auscultation bilaterally Heart: regular rate and rhythm   Rate: 88  Rhythm: normal sinus rhythm and premature ventricular contractions (PVC)  Lab Results:  Recent Labs  07/14/14 1915 07/16/14 0435  WBC 20.0* 16.1*  HGB 13.8 9.7*  PLT 602* 454*    Recent Labs  07/15/14 0206 07/16/14 0435  NA 135 135  K 3.8 3.9  CL 101 104  CO2 27 25  GLUCOSE 115* 148*  BUN 18 17  CREATININE 0.72 0.62    Recent Labs  07/14/14 1915  TROPONINI <0.03   No results for input(s): INR in the last 72 hours.  Scheduled Meds: . flecainide  75 mg Oral BID  . insulin aspart  0-9 Units Subcutaneous 6 times per day  . ipratropium-albuterol  3 mL Nebulization Q6H  . lactose free nutrition  237 mL Oral QID  . methylPREDNISolone (SOLU-MEDROL) injection  40 mg Intravenous 4 times per day  . metoprolol tartrate  25 mg Oral BID  . midodrine  2.5 mg Oral BID WC  . piperacillin-tazobactam (ZOSYN)  IV  3.375 g Intravenous 3 times per day  . simvastatin  20 mg Oral q1800  . vancomycin  1,000 mg Intravenous BID   Continuous Infusions: . sodium chloride 75 mL/hr at 07/16/14 0300  . diltiazem (CARDIZEM) infusion Stopped (07/16/14 0657)   PRN Meds:.HYDROcodone-acetaminophen, HYDROmorphone (DILAUDID) injection, levalbuterol,  temazepam   Imaging: Imaging results have been reviewed  Cardiac Studies: Echo 07/15/14 Study Conclusions  - Left ventricle: The cavity size was normal. There was mild concentric hypertrophy. Systolic function was normal. The estimated ejection fraction was in the range of 55% to 60%. Wall motion was normal; there were no regional wall motion abnormalities. - Mitral valve: Calcified annulus.  Assessment/Plan:  74 y.o. male with a past medical history significant for lung cancer and PAF. He has had documented PAF and in Jan 2016 he had syncope with orthostatic B/P changes and rapid PSVT documented on holter monitor. Dr Harrington Challenger started him on Flecainide and Midodrine at that point and he has done well from a cardiac standpoint. He is not felt to be a candidate for anticoagulation secondary to his oncology issues. The pt was admitted 07/14/14 with dyspnea and was found to be in rapid AF. He admitted he had cut his Flecainide back to 50 mg BID from 75 mg BID (difficult to cut a pill in half).  Since admission his rate was controlled with IV Diltiazem and his original dose of Flecainide resumed (75 mg BID). He was also Rx'd for possible HCAP. He has converted to NSR overnight.  Active Problems:   Atrial fibrillation with RVR   Dyspnea   HCAP (healthcare-associated pneumonia)   Uncontrolled secondary diabetes mellitus with complication  Primary cancer of right lower lobe of lung   PAF- on Flecainide,    Syncope   ILD (interstitial lung disease)   Malnutrition of moderate degree   Erosive esophagitis on recent endoscopy   History of bladder cancer   Orthostatic hypotension-on Midodrine   PLAN: It's unclear how long he has been in AF-an EKG from May 2016 showed NSR.  Hopefully resumption of NSR may improve dyspnea. I will arrange f/u with Dr Harrington Challenger in a few weeks as an OP. OK to DC Diltiazem. EKG this am confirms NSR, QTc 445.  [employer] PA-C 07/16/2014, 9:13 AM [phone]

## 2014-07-17 DIAGNOSIS — R0600 Dyspnea, unspecified: Secondary | ICD-10-CM

## 2014-07-17 LAB — GLUCOSE, CAPILLARY
GLUCOSE-CAPILLARY: 100 mg/dL — AB (ref 65–99)
GLUCOSE-CAPILLARY: 130 mg/dL — AB (ref 65–99)
Glucose-Capillary: 99 mg/dL (ref 65–99)
Glucose-Capillary: 89 mg/dL (ref 65–99)

## 2014-07-17 LAB — PHOSPHORUS: PHOSPHORUS: 2.5 mg/dL (ref 2.5–4.6)

## 2014-07-17 LAB — PROCALCITONIN: Procalcitonin: <0.1 ng/mL

## 2014-07-17 MED ORDER — SODIUM CHLORIDE 0.9 % IJ SOLN
10.0000 mL | INTRAMUSCULAR | Status: DC | PRN
  Administered 2014-07-17: 10 mL
  Filled 2014-07-17: qty 40

## 2014-07-17 MED ORDER — SODIUM CHLORIDE 0.9 % IJ SOLN
10.0000 mL | Freq: Two times a day (BID) | INTRAMUSCULAR | Status: DC
  Administered 2014-07-17: 10 mL

## 2014-07-17 MED ORDER — HEPARIN SOD (PORK) LOCK FLUSH 100 UNIT/ML IV SOLN
500.0000 [IU] | INTRAVENOUS | Status: AC | PRN
  Administered 2014-07-17: 500 [IU]

## 2014-07-17 MED ORDER — AMOXICILLIN-POT CLAVULANATE 400-57 MG/5ML PO SUSR: 400.0000 mg | Freq: Two times a day (BID) | ORAL | Status: AC

## 2014-07-17 MED ORDER — FLEET ENEMA 7-19 GM/118ML RE ENEM
1.0000 | ENEMA | Freq: Once | RECTAL | Status: AC
  Administered 2014-07-17: 1 via RECTAL
  Filled 2014-07-17: qty 1

## 2014-07-17 MED ORDER — PANTOPRAZOLE SODIUM 40 MG PO PACK: 80.0000 mg | PACK | Freq: Every day | ORAL | Status: AC

## 2014-07-17 MED ORDER — AMOXICILLIN-POT CLAVULANATE 400-57 MG/5ML PO SUSR: 400.0000 mg | Freq: Two times a day (BID) | ORAL | Status: DC

## 2014-07-17 MED ORDER — FLECAINIDE ACETATE 100 MG PO TABS: 100.0000 mg | ORAL_TABLET | Freq: Two times a day (BID) | ORAL | Status: DC

## 2014-07-17 MED ORDER — BOOST PLUS PO LIQD: 237.0000 mL | Freq: Four times a day (QID) | ORAL | Status: DC

## 2014-07-17 MED ORDER — IPRATROPIUM-ALBUTEROL 0.5-2.5 (3) MG/3ML IN SOLN: 3.0000 mL | Freq: Four times a day (QID) | RESPIRATORY_TRACT | Status: DC

## 2014-07-17 MED ORDER — HYDROCODONE-ACETAMINOPHEN 7.5-325 MG/15ML PO SOLN: 10.0000 mL | ORAL | Status: DC | PRN

## 2014-07-17 NOTE — Discharge Summary (Signed)
Physician Discharge Summary  Steve [name] PRF:[id number] DOB: [birth year] DOA: 07/14/2014  PCP: [name], MD  Admit date: 07/14/2014 Discharge date: 07/17/2014  Time spent: 30inutes  Recommendations for Outpatient Follow-up:  1. Follow up with oncology and PCP as recommended.   Discharge Diagnoses:  Active Problems:   Uncontrolled secondary diabetes mellitus with complication   Primary cancer of right lower lobe of lung   History of bladder cancer   PAF- on Flecainide,    Syncope   ILD (interstitial lung disease)   Atrial fibrillation with RVR   Dyspnea   HCAP (healthcare-associated pneumonia)   Malnutrition of moderate degree   Orthostatic hypotension-on Midodrine   Erosive esophagitis on recent endoscopy   Discharge Condition: improved.   Diet recommendation: liquid diet  Filed Weights   07/14/14 2345 07/16/14 1235  Weight: 76 kg (167 lb 8.8 oz) 78.5 kg (173 lb 1 oz)    History of present illness:  Steve [name] is a 74 y.o. male   has a past medical history of Dizziness and giddiness; Hyperlipidemia; Impotence of organic origin; Osteoarthritis, shoulder; Allergy; Hypothyroidism; COPD (chronic obstructive pulmonary disease); Insomnia; radiation therapy (02/2013); Diabetes mellitus without complication; Peripheral neuropathy; Malignant neoplasm of bladder, part unspecified; and Malignant neoplasm of bronchus and lung, presented with sob and is admitted to step down for acute respiratory failure.   Hospital Course:  1. Acute on chronic respiratory failure: -probably from progression of disease in addition to HCAP. Transitioned to oral antibiotics by PCCM.  Pulmonology consulted and recommendations given.  - plan for airway stent at [facility].     2. Paroxysmal atrial fibrillation: - Converted to sinus. Anticoagulation as per his primary cardio and oncology.  Increased his flecainide on discharge.   3. Odynophagia: Speech eval recommending liquid diet.   4.  Leukocytosis: Probably from the infection. Improved.    5. Primary lung cancer with mets: - further management as per his oncology.     Procedures:  none  Consultations: none  Discharge Exam: Filed Vitals:   07/17/14 0857  BP: 125/75  Pulse: 96  Temp:   Resp:     General: alert afebrile comfortable Cardiovascular: s1s2 Respiratory: ctab  Discharge Instructions   Discharge Instructions    Diet - low sodium heart healthy    Complete by:  As directed      Discharge instructions    Complete by:  As directed   Follow up with PCP and cardiologist as recommended          Current Discharge Medication List    START taking these medications   Details  amoxicillin-clavulanate (AUGMENTIN) 400-57 MG/5ML suspension Take 5 mLs (400 mg total) by mouth every 12 (twelve) hours. Qty: 100 mL, Refills: 0    HYDROcodone-acetaminophen (HYCET) 7.5-325 mg/15 ml solution Take 10 mLs by mouth every 3 (three) hours as needed for moderate pain. Qty: 120 mL, Refills: 0    ipratropium-albuterol (DUONEB) 0.5-2.5 (3) MG/3ML SOLN Take 3 mLs by nebulization every 6 (six) hours. Qty: 360 mL, Refills: 0    lactose free nutrition (BOOST PLUS) LIQD Take 237 mLs by mouth 4 (four) times daily. Refills: 0    pantoprazole sodium (PROTONIX) 40 mg/20 mL PACK Place 40 mLs (80 mg total) into feeding tube daily. Qty: 30 each, Refills: 1      CONTINUE these medications which have CHANGED   Details  flecainide (TAMBOCOR) 100 MG tablet Take 1 tablet (100 mg total) by mouth 2 (two) times daily. Qty:  60 tablet, Refills: 0      CONTINUE these medications which have NOT CHANGED   Details  Ipratropium-Albuterol (COMBIVENT) 20-100 MCG/ACT AERS respimat Inhale 1 puff into the lungs every 6 (six) hours. Qty: 1 Inhaler, Refills: 2    lidocaine (XYLOCAINE) 2 % solution Use as directed 20 mLs in the mouth or throat every 6 (six) hours as needed for mouth pain. Qty: 480 mL, Refills: 1    metoprolol  tartrate (LOPRESSOR) 25 MG tablet Take 1 tablet (25 mg total) by mouth 2 (two) times daily. Qty: 90 tablet, Refills: 1   Associated Diagnoses: Paroxysmal atrial fibrillation; Syncope, unspecified syncope type; Primary cancer of right lower lobe of lung; Malignant neoplasm of lower lobe of right lung    midodrine (PROAMATINE) 2.5 MG tablet Take 2.5 mg by mouth 2 (two) times daily with a meal.    Associated Diagnoses: Primary cancer of right lower lobe of lung    OVER THE COUNTER MEDICATION Take 2 tablets by mouth at bedtime. "Dulcolax with stool softener."    simvastatin (ZOCOR) 20 MG tablet Take one tablet by mouth once daily to lower cholesterol Qty: 90 tablet, Refills: 3    temazepam (RESTORIL) 30 MG capsule Take 1 capsule (30 mg total) by mouth at bedtime as needed for sleep. Qty: 30 capsule, Refills: 0   Associated Diagnoses: Insomnia      STOP taking these medications     oxyCODONE-acetaminophen (ROXICET) 5-325 MG per tablet      gabapentin (NEURONTIN) 300 MG capsule        Allergies  Allergen Reactions  . Morphine And Related Hives   Follow-up Information    Follow up with Dorris Carnes, MD.   Specialty:  Cardiology   Why:  office will contact you   Contact information:   [place] [address] [phone]       Follow up with GREEN, Viviann Spare, MD In 1 week.   Specialty:  Internal Medicine   Contact information:   [place] [address] [phone]        The results of significant diagnostics from this hospitalization (including imaging, microbiology, ancillary and laboratory) are listed below for reference.    Significant Diagnostic Studies: Ct Angio Chest Pe W/cm &/or Wo Cm  07/14/2014   CLINICAL DATA:  74 year old male with history of lung cancer and COPD complaining of shortness of breath.  EXAM: CT ANGIOGRAPHY CHEST WITH CONTRAST  TECHNIQUE: Multidetector CT imaging of the chest was performed using the  standard protocol during bolus administration of intravenous contrast. Multiplanar CT image reconstructions and MIPs were obtained to evaluate the vascular anatomy.  CONTRAST:  147m OMNIPAQUE IOHEXOL 350 MG/ML SOLN  COMPARISON:  Chest radiograph dated 07/14/2014 and CT dated 05/14/2014.  FINDINGS: Mild emphysematous changes. There is stable appearing 7.0 x 4.5 cm right lower lobe mass with encasement of the right middle and right lower lobe bronchi and occlusion of the right lower lobe bronchus. There is associated postobstructive atelectasis versus pneumonia. Stable moderate right pleural effusion again noted. There is apparent extension of the mass into the right hilar and subcarinal region.  Mild atherosclerotic calcification of the aorta. No CT evidence of pulmonary embolism. There is encasement and complete occlusion of the right lower lobe pulmonary artery branch by the lung mass. No cardiomegaly. There is coronary vascular calcification. Small pericardial effusion.  Right pectoral Port-A-Cath with tip in central SVC. Partially visualized subcentimeter left hepatic hypodense lesion,  incompletely characterized. Degenerative changes of the spine. No acute fracture.  Review of the MIP images confirms the above findings.  IMPRESSION: No CT evidence of pulmonary embolism.  Stable appearing right lower lobe mass with encasement of the right lower lobe pulmonary arteries branch as well as right lower lobe bronchus. There is postobstructive atelectasis/ pneumonia.  Grossly stable right pleural effusion.  Scratch   Electronically Signed   By: Anner Crete M.D.   On: 07/14/2014 22:45   Dg Esophagus  06/29/2014   CLINICAL DATA:  Dysphagia and odynophagia. Choking and coughing. Solid food and liquids sticking in throat and chest. Previous radiation therapy and chemotherapy for lung carcinoma.  EXAM: ESOPHOGRAM / BARIUM SWALLOW / BARIUM TABLET STUDY  TECHNIQUE: Combined double contrast and single contrast  examination performed using effervescent crystals, thick barium liquid, and thin barium liquid. The patient was observed with fluoroscopy swallowing a 13 mm barium sulphate tablet.  FLUOROSCOPY TIME:  Fluoroscopy Time:  2 minutes 1 second  Number of Acquired Images:  11  COMPARISON:  Recent chest CT on 05/14/2014  FINDINGS: No evidence of vestibular penetration or aspiration during swallowing. The pharynx has a normal appearance.  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 This corresponds with subcarinal mediastinal lymphadenopathy seen on recent chest CT.  No definite findings of esophagitis noted. Esophageal motility is within normal limits. No evidence of hiatal hernia or gastroesophgeal reflux. An ingested 42m barium tablet passed freely through the esophagus, and into the stomach.  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 This stricture does not prevent passage of liquid barium or 13 mm barium tablet.   Electronically Signed   By: JEarle GellM.D.   On: 06/29/2014 14:33   Dg Chest Port 1 View  07/14/2014   CLINICAL DATA:  Worsening shortness of breath and chest pain. Currently on chemotherapy for right lower lobe lung carcinoma.  EXAM: PORTABLE CHEST - 1 VIEW  COMPARISON:  06/10/2014  FINDINGS: Right-sided power port remains in appropriate position. Right infrahilar mass like opacity shows no significant change allowing for differences in positioning. Pleural- parenchymal scarring at right lung base appears stable. No new areas of pulmonary opacity are identified. No evidence of pneumothorax or pleural effusion. Heart size is within normal limits.  IMPRESSION: No significant change in right infrahilar masslike opacity and right basilar scarring.   Electronically Signed   By: JEarle GellM.D.   On: 07/14/2014 19:33    Microbiology: Recent Results (from  the past 240 hour(s))  Blood culture (routine x 2)     Status: None (Preliminary result)   Collection Time: 07/14/14  7:39 PM  Result Value Ref Range Status   Specimen Description BLOOD PORT A CATH  Final   Special Requests BOTTLES DRAWN AEROBIC AND ANAEROBIC 5CC  Final   Culture   Final    NO GROWTH 2 DAYS [facility]   Report Status PENDING  Incomplete  Blood culture (routine x 2)     Status: None (Preliminary result)   Collection Time: 07/14/14  8:39 PM  Result Value Ref Range Status   Specimen Description BLOOD RIGHT HAND  Final   Special Requests BOTTLES DRAWN AEROBIC ONLY 3ML  Final   Culture   Final    NO GROWTH 2 DAYS [facility]   Report Status PENDING  Incomplete  MRSA PCR Screening     Status: None   Collection Time: 07/14/14 11:53 PM  Result Value Ref Range Status   MRSA by PCR NEGATIVE NEGATIVE Final    Comment:        The GeneXpert MRSA Assay (FDA approved for NASAL specimens only), is one component of a comprehensive MRSA colonization surveillance program. It is not intended to diagnose MRSA infection nor to guide or monitor treatment for MRSA infections.      Labs: Basic Metabolic Panel:  Recent Labs Lab 07/14/14 1915 07/15/14 0206 07/15/14 1546 07/16/14 0435 07/17/14 0457  NA 136 135  --  135  --   K 4.3 3.8  --  3.9  --   CL 97* 101  --  104  --   CO2 27 27  --  25  --   GLUCOSE 158* 115*  --  148*  --   BUN 23* 18  --  17  --   CREATININE 0.91 0.72  --  0.62  --   CALCIUM 9.8 8.8*  --  8.9  --   MG  --   --  1.9  --   --   PHOS  --   --  2.4*  --  2.5   Liver Function Tests:  Recent Labs Lab 07/14/14 1915 07/15/14 0206  AST 16 17  ALT 19 19  ALKPHOS 81 75  BILITOT 0.6 0.5  PROT 8.3* 7.0  ALBUMIN 3.5 3.0*   No results for input(s): LIPASE, AMYLASE in the last 168 hours. No results for input(s): AMMONIA in the last 168 hours. CBC:  Recent Labs Lab 07/14/14 1915 07/16/14 0435   WBC 20.0* 16.1*  NEUTROABS 16.3*  --   HGB 13.8 9.7*  HCT 43.5 29.9*  MCV 94.8 93.4  PLT 602* 454*   Cardiac Enzymes:  Recent Labs Lab 07/14/14 1915  TROPONINI <0.03   BNP: BNP (last 3 results)  Recent Labs  07/14/14 1916  BNP 333.5*    ProBNP (last 3 results) No results for input(s): PROBNP in the last 8760 hours.  CBG:  Recent Labs Lab 07/16/14 1946 07/17/14 0003 07/17/14 0444 07/17/14 0741 07/17/14 1151  GLUCAP 111* 130* 100* 99 89       Signed:  Shaunita Seney  [facility] 07/17/2014, 12:52 PM

## 2014-07-20 LAB — CULTURE, BLOOD (ROUTINE X 2)
CULTURE: NO GROWTH
CULTURE: NO GROWTH

## 2014-07-24 ENCOUNTER — Encounter (HOSPITAL_COMMUNITY): Payer: Self-pay | Admitting: *Deleted

## 2014-07-24 ENCOUNTER — Emergency Department (HOSPITAL_COMMUNITY): Payer: Medicare HMO

## 2014-07-24 ENCOUNTER — Inpatient Hospital Stay (HOSPITAL_COMMUNITY)
Admission: EM | Admit: 2014-07-24 | Discharge: 2014-07-29 | DRG: 391 | Disposition: A | Discharge status: Home / Self Care | Payer: Medicare HMO | Attending: Family Medicine | Admitting: Family Medicine

## 2014-07-24 DIAGNOSIS — Z823 Family history of stroke: Secondary | ICD-10-CM

## 2014-07-24 DIAGNOSIS — C3430 Malignant neoplasm of lower lobe, unspecified bronchus or lung: Secondary | ICD-10-CM | POA: Diagnosis not present

## 2014-07-24 DIAGNOSIS — K208 Other esophagitis: Secondary | ICD-10-CM | POA: Diagnosis present

## 2014-07-24 DIAGNOSIS — D72829 Elevated white blood cell count, unspecified: Secondary | ICD-10-CM | POA: Diagnosis not present

## 2014-07-24 DIAGNOSIS — Z6822 Body mass index (BMI) 22.0-22.9, adult: Secondary | ICD-10-CM | POA: Diagnosis not present

## 2014-07-24 DIAGNOSIS — K222 Esophageal obstruction: Secondary | ICD-10-CM | POA: Diagnosis present

## 2014-07-24 DIAGNOSIS — R591 Generalized enlarged lymph nodes: Secondary | ICD-10-CM | POA: Diagnosis present

## 2014-07-24 DIAGNOSIS — G8929 Other chronic pain: Secondary | ICD-10-CM | POA: Diagnosis present

## 2014-07-24 DIAGNOSIS — E43 Unspecified severe protein-calorie malnutrition: Secondary | ICD-10-CM | POA: Diagnosis present

## 2014-07-24 DIAGNOSIS — Z9221 Personal history of antineoplastic chemotherapy: Secondary | ICD-10-CM

## 2014-07-24 DIAGNOSIS — K5909 Other constipation: Secondary | ICD-10-CM | POA: Diagnosis present

## 2014-07-24 DIAGNOSIS — E46 Unspecified protein-calorie malnutrition: Secondary | ICD-10-CM | POA: Diagnosis present

## 2014-07-24 DIAGNOSIS — E785 Hyperlipidemia, unspecified: Secondary | ICD-10-CM | POA: Diagnosis present

## 2014-07-24 DIAGNOSIS — M1990 Unspecified osteoarthritis, unspecified site: Secondary | ICD-10-CM | POA: Diagnosis present

## 2014-07-24 DIAGNOSIS — J9610 Chronic respiratory failure, unspecified whether with hypoxia or hypercapnia: Secondary | ICD-10-CM | POA: Diagnosis present

## 2014-07-24 DIAGNOSIS — I4892 Unspecified atrial flutter: Secondary | ICD-10-CM | POA: Diagnosis present

## 2014-07-24 DIAGNOSIS — K259 Gastric ulcer, unspecified as acute or chronic, without hemorrhage or perforation: Secondary | ICD-10-CM | POA: Diagnosis present

## 2014-07-24 DIAGNOSIS — Z79899 Other long term (current) drug therapy: Secondary | ICD-10-CM | POA: Diagnosis not present

## 2014-07-24 DIAGNOSIS — I4891 Unspecified atrial fibrillation: Secondary | ICD-10-CM

## 2014-07-24 DIAGNOSIS — Z885 Allergy status to narcotic agent status: Secondary | ICD-10-CM | POA: Diagnosis not present

## 2014-07-24 DIAGNOSIS — R1310 Dysphagia, unspecified: Secondary | ICD-10-CM | POA: Diagnosis present

## 2014-07-24 DIAGNOSIS — Z809 Family history of malignant neoplasm, unspecified: Secondary | ICD-10-CM

## 2014-07-24 DIAGNOSIS — Z8551 Personal history of malignant neoplasm of bladder: Secondary | ICD-10-CM

## 2014-07-24 DIAGNOSIS — Z833 Family history of diabetes mellitus: Secondary | ICD-10-CM | POA: Diagnosis not present

## 2014-07-24 DIAGNOSIS — I480 Paroxysmal atrial fibrillation: Secondary | ICD-10-CM | POA: Diagnosis present

## 2014-07-24 DIAGNOSIS — D630 Anemia in neoplastic disease: Secondary | ICD-10-CM | POA: Diagnosis present

## 2014-07-24 DIAGNOSIS — R627 Adult failure to thrive: Secondary | ICD-10-CM | POA: Diagnosis present

## 2014-07-24 DIAGNOSIS — Z87891 Personal history of nicotine dependence: Secondary | ICD-10-CM | POA: Diagnosis not present

## 2014-07-24 DIAGNOSIS — T40605A Adverse effect of unspecified narcotics, initial encounter: Secondary | ICD-10-CM | POA: Diagnosis present

## 2014-07-24 DIAGNOSIS — C3491 Malignant neoplasm of unspecified part of right bronchus or lung: Secondary | ICD-10-CM | POA: Diagnosis present

## 2014-07-24 DIAGNOSIS — K209 Esophagitis, unspecified: Secondary | ICD-10-CM | POA: Diagnosis present

## 2014-07-24 DIAGNOSIS — C3431 Malignant neoplasm of lower lobe, right bronchus or lung: Secondary | ICD-10-CM | POA: Diagnosis not present

## 2014-07-24 DIAGNOSIS — Z8249 Family history of ischemic heart disease and other diseases of the circulatory system: Secondary | ICD-10-CM

## 2014-07-24 DIAGNOSIS — E1142 Type 2 diabetes mellitus with diabetic polyneuropathy: Secondary | ICD-10-CM | POA: Diagnosis present

## 2014-07-24 DIAGNOSIS — T380X5A Adverse effect of glucocorticoids and synthetic analogues, initial encounter: Secondary | ICD-10-CM | POA: Diagnosis present

## 2014-07-24 DIAGNOSIS — Z79891 Long term (current) use of opiate analgesic: Secondary | ICD-10-CM

## 2014-07-24 DIAGNOSIS — J449 Chronic obstructive pulmonary disease, unspecified: Secondary | ICD-10-CM | POA: Diagnosis present

## 2014-07-24 DIAGNOSIS — E039 Hypothyroidism, unspecified: Secondary | ICD-10-CM | POA: Diagnosis present

## 2014-07-24 DIAGNOSIS — Z8711 Personal history of peptic ulcer disease: Secondary | ICD-10-CM

## 2014-07-24 DIAGNOSIS — J45909 Unspecified asthma, uncomplicated: Secondary | ICD-10-CM | POA: Diagnosis present

## 2014-07-24 DIAGNOSIS — J4530 Mild persistent asthma, uncomplicated: Secondary | ICD-10-CM | POA: Diagnosis not present

## 2014-07-24 DIAGNOSIS — R918 Other nonspecific abnormal finding of lung field: Secondary | ICD-10-CM | POA: Diagnosis not present

## 2014-07-24 HISTORY — DX: Supraventricular tachycardia, unspecified: I47.10

## 2014-07-24 HISTORY — DX: Chronic respiratory failure, unspecified whether with hypoxia or hypercapnia: J96.10

## 2014-07-24 HISTORY — DX: Paroxysmal atrial fibrillation: I48.0

## 2014-07-24 HISTORY — DX: Esophagitis, unspecified without bleeding: K20.90

## 2014-07-24 HISTORY — DX: Esophagitis, unspecified: K20.9

## 2014-07-24 HISTORY — DX: Unspecified asthma, uncomplicated: J45.909

## 2014-07-24 HISTORY — DX: Supraventricular tachycardia: I47.1

## 2014-07-24 HISTORY — DX: Esophageal obstruction: K22.2

## 2014-07-24 LAB — I-STAT TROPONIN, ED: TROPONIN I, POC: 0 ng/mL (ref 0.00–0.08)

## 2014-07-24 LAB — BASIC METABOLIC PANEL
ANION GAP: 9 (ref 5–15)
BUN: 17 mg/dL (ref 6–20)
CALCIUM: 9.1 mg/dL (ref 8.9–10.3)
CO2: 26 mmol/L (ref 22–32)
CREATININE: 0.81 mg/dL (ref 0.61–1.24)
Chloride: 100 mmol/L — ABNORMAL LOW (ref 101–111)
GFR calc non Af Amer: >60 mL/min (ref >60)
Glucose, Bld: 115 mg/dL — ABNORMAL HIGH (ref 65–99)
Potassium: 4 mmol/L (ref 3.5–5.1)
Sodium: 135 mmol/L (ref 135–145)

## 2014-07-24 LAB — CBC
HCT: 37.7 % — ABNORMAL LOW (ref 39.0–52.0)
HEMOGLOBIN: 11.9 g/dL — AB (ref 13.0–17.0)
MCH: 29.8 pg (ref 26.0–34.0)
MCHC: 31.6 g/dL (ref 30.0–36.0)
MCV: 94.3 fL (ref 78.0–100.0)
PLATELETS: 467 10*3/uL — AB (ref 150–400)
RBC: 4 MIL/uL — AB (ref 4.22–5.81)
RDW: 16.3 % — ABNORMAL HIGH (ref 11.5–15.5)
WBC: 15.7 10*3/uL — AB (ref 4.0–10.5)

## 2014-07-24 LAB — MAGNESIUM: Magnesium: 2.1 mg/dL (ref 1.7–2.4)

## 2014-07-24 LAB — I-STAT CG4 LACTIC ACID, ED: Lactic Acid, Venous: 1.07 mmol/L (ref 0.5–2.0)

## 2014-07-24 MED ORDER — ASPIRIN 300 MG RE SUPP
300.0000 mg | Freq: Every day | RECTAL | Status: DC
  Filled 2014-07-24 (×6): qty 1

## 2014-07-24 MED ORDER — FLEET ENEMA 7-19 GM/118ML RE ENEM
1.0000 | ENEMA | Freq: Once | RECTAL | Status: AC
  Administered 2014-07-24: 1 via RECTAL
  Filled 2014-07-24: qty 1

## 2014-07-24 MED ORDER — SODIUM CHLORIDE 0.9 % IV BOLUS (SEPSIS)
1000.0000 mL | Freq: Once | INTRAVENOUS | Status: AC
  Administered 2014-07-24: 1000 mL via INTRAVENOUS

## 2014-07-24 MED ORDER — DEXTROSE 5 % IV SOLN
5.0000 mg/h | INTRAVENOUS | Status: DC
  Administered 2014-07-24 – 2014-07-25 (×2): 5 mg/h via INTRAVENOUS
  Filled 2014-07-24: qty 100

## 2014-07-24 MED ORDER — ONDANSETRON HCL 4 MG PO TABS: 4.0000 mg | ORAL_TABLET | Freq: Four times a day (QID) | ORAL | Status: DC | PRN

## 2014-07-24 MED ORDER — FLEET ENEMA 7-19 GM/118ML RE ENEM: 1.0000 | ENEMA | Freq: Once | RECTAL | Status: AC | PRN

## 2014-07-24 MED ORDER — HYDROMORPHONE HCL 1 MG/ML IJ SOLN
1.0000 mg | Freq: Once | INTRAMUSCULAR | Status: AC
  Administered 2014-07-24: 1 mg via INTRAVENOUS
  Filled 2014-07-24: qty 1

## 2014-07-24 MED ORDER — PANTOPRAZOLE SODIUM 40 MG IV SOLR
40.0000 mg | INTRAVENOUS | Status: DC
  Administered 2014-07-24: 40 mg via INTRAVENOUS
  Filled 2014-07-24: qty 40

## 2014-07-24 MED ORDER — DILTIAZEM HCL 25 MG/5ML IV SOLN
10.0000 mg | Freq: Once | INTRAVENOUS | Status: AC
  Administered 2014-07-24: 10 mg via INTRAVENOUS
  Filled 2014-07-24: qty 5

## 2014-07-24 MED ORDER — IPRATROPIUM BROMIDE 0.02 % IN SOLN: 0.5000 mg | Freq: Four times a day (QID) | RESPIRATORY_TRACT | Status: DC | PRN

## 2014-07-24 MED ORDER — ONDANSETRON HCL 4 MG/2ML IJ SOLN: 4.0000 mg | Freq: Four times a day (QID) | INTRAMUSCULAR | Status: DC | PRN

## 2014-07-24 MED ORDER — DILTIAZEM HCL 100 MG IV SOLR
5.0000 mg/h | Freq: Once | INTRAVENOUS | Status: AC
  Administered 2014-07-24: 5 mg/h via INTRAVENOUS
  Filled 2014-07-24: qty 100

## 2014-07-24 MED ORDER — METOPROLOL TARTRATE 1 MG/ML IV SOLN
2.5000 mg | Freq: Once | INTRAVENOUS | Status: AC
  Administered 2014-07-24: 2.5 mg via INTRAVENOUS
  Filled 2014-07-24: qty 5

## 2014-07-24 MED ORDER — HYDROMORPHONE HCL 1 MG/ML IJ SOLN
0.5000 mg | INTRAMUSCULAR | Status: DC | PRN
Start: 2014-07-24 — End: 2014-07-25
  Administered 2014-07-24 – 2014-07-25 (×5): 0.5 mg via INTRAVENOUS
  Filled 2014-07-24 (×6): qty 1

## 2014-07-24 MED ORDER — METOPROLOL TARTRATE 1 MG/ML IV SOLN
2.5000 mg | INTRAVENOUS | Status: DC
  Administered 2014-07-24 – 2014-07-25 (×2): 2.5 mg via INTRAVENOUS
  Filled 2014-07-24 (×2): qty 5

## 2014-07-24 MED ORDER — IPRATROPIUM-ALBUTEROL 0.5-2.5 (3) MG/3ML IN SOLN
3.0000 mL | Freq: Once | RESPIRATORY_TRACT | Status: AC
  Administered 2014-07-24: 3 mL via RESPIRATORY_TRACT
  Filled 2014-07-24: qty 3

## 2014-07-24 MED ORDER — ENOXAPARIN SODIUM 40 MG/0.4ML ~~LOC~~ SOLN
40.0000 mg | Freq: Every day | SUBCUTANEOUS | Status: DC
  Administered 2014-07-24 – 2014-07-25 (×2): 40 mg via SUBCUTANEOUS
  Filled 2014-07-24 (×3): qty 0.4

## 2014-07-24 MED ORDER — SODIUM CHLORIDE 0.9 % IJ SOLN
10.0000 mL | Freq: Two times a day (BID) | INTRAMUSCULAR | Status: DC
  Administered 2014-07-29: 10 mL

## 2014-07-24 MED ORDER — METOPROLOL TARTRATE 1 MG/ML IV SOLN: 2.5000 mg | Freq: Four times a day (QID) | INTRAVENOUS | Status: DC | PRN

## 2014-07-24 MED ORDER — MAGNESIUM SULFATE 2 GM/50ML IV SOLN
2.0000 g | Freq: Once | INTRAVENOUS | Status: AC
  Administered 2014-07-24: 2 g via INTRAVENOUS
  Filled 2014-07-24: qty 50

## 2014-07-24 MED ORDER — SODIUM CHLORIDE 0.9 % IJ SOLN
10.0000 mL | INTRAMUSCULAR | Status: DC | PRN
  Administered 2014-07-27: 10 mL
  Filled 2014-07-24: qty 40

## 2014-07-24 MED ORDER — LEVALBUTEROL HCL 1.25 MG/0.5ML IN NEBU
1.2500 mg | INHALATION_SOLUTION | Freq: Four times a day (QID) | RESPIRATORY_TRACT | Status: DC | PRN
  Filled 2014-07-24: qty 0.5

## 2014-07-24 NOTE — ED Notes (Signed)
Patient transported to X-ray 

## 2014-07-24 NOTE — ED Notes (Signed)
Patients came into ED today d/t increased SOB and difficulty swallowing over this last week. Pt was sent home on a soft diet but unable to tolerate.Pt was admitted last week d/t AFIB

## 2014-07-24 NOTE — ED Notes (Signed)
I attempted to collect labs twice and was unsuccessful.  I made the nurse aware. 

## 2014-07-24 NOTE — ED Notes (Signed)
Bedside report received from [employer], Therapist, sports.

## 2014-07-24 NOTE — ED Notes (Signed)
Per Pharmacist, Anh, Cardizem and Magnesium Sulfate is compatible.

## 2014-07-24 NOTE — H&P (Signed)
[facility] History and Physical  JAPHETH DIEKMAN XKG:[id number] DOB: Feb 11, 1940 DOA: 07/24/2014  Referring physician: Shirlyn Goltz, MD. PCP: [name], MD   Chief Complaint: Difficulty swallowing.  HPI: Steve [name] is a 74 y.o. male with bellow past medical history who comes to the emergency room with worsening dysphagia for the past few days. The patient states that he has been able to get by with liquids, crushing his medications and was doing well with this strategy on until today in the morning when he noticed that he was unable to move a swallow some of his crushed medications this in applesauce. He is status that he was only able to take his Protonix and was unable to take the rest of his medications. The patient has also been constipated for the past 5-6 days. He states the last time that he had a normal bowel movement was when he was here at the hospital on his previous admission. He denies fever or chills, palpitations, dyspnea, but complains of cough and odynophagia. He has lost about 8 pounds in the last 30 days.  Once he was sitting in the emergency room, he was given a DuoNeb treatment, then went for enema placement and became tachycardic and tachypneic, and EKG done at that time showed atrial fibrillation with 147 bpm ventricular rate. He was subsequently started on AND drip, which improved his ventricular rate, but did not control it. After I gave him magnesium sulfate 2 g IV piggyback and 2.5 mg of metoprolol, the patient's heart rate declined to bpm and he converted to sinus. He is being admitted for rate control and to be seen by gastroenterology (Dr. Fuller Plan), which was notified by Dr. Shirlyn Goltz. He is currently in no acute distress.    Review of Systems:  Constitutional:  No weight loss, night sweats, Fevers, chills, fatigue.  HEENT:  No headaches, Difficulty swallowing,Tooth/dental problems,Sore throat,  No sneezing, itching, ear ache, nasal congestion, post  nasal drip,  Cardio-vascular:  No chest pain, Orthopnea, PND, swelling in lower extremities, anasarca, dizziness, palpitations  GI:  No heartburn, indigestion, abdominal pain, nausea, vomiting, diarrhea, change in bowel habits, loss of appetite  Resp:  No shortness of breath with exertion or at rest. No excess mucus, no productive cough, No non-productive cough, No coughing up of blood.No change in color of mucus.No wheezing.No chest wall deformity  Skin:  no rash or lesions.  GU:  no dysuria, change in color of urine, no urgency or frequency. No flank pain.  Musculoskeletal:  No joint pain or swelling. No decreased range of motion. No back pain.  Psych:  No change in mood or affect. No depression or anxiety. No memory loss.   Past Medical History  Diagnosis Date  . Dizziness and giddiness     positional vertigo  . Hyperlipidemia   . Impotence of organic origin   . Osteoarthritis, shoulder   . Allergy   . Hypothyroidism     pt denies thryoid disease, dr pandey note 11-19-12 says subclinical hypothryoid in epic  . COPD (chronic obstructive pulmonary disease)     pt denies  . Insomnia   . Hx of radiation therapy 02/2013    right lung  . Diabetes mellitus without complication     pt denies diabetes, dr pandey note says dm 11-19-12 epic  . 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 Malignant neoplasm of bladder, part unspecified   . Malignant neoplasm of bronchus and  lung, unspecified site   . Atrial fibrillation    Past Surgical History  Procedure Laterality Date  . Cystoscopy w/ dilation of bladder    . Tonsillectomy  age 65  . Endobronchial ultrasound Bilateral 12/15/2012    Procedure: ENDOBRONCHIAL ULTRASOUND;  Surgeon: Brand Males, MD;  Location: [facility]OSCOPY;  Service: Cardiopulmonary;  Laterality: Bilateral;  . Colonoscopy  2009    [facility]   Social History:  reports that he quit smoking about 7 years ago. His smoking use included Cigarettes. He has  a 75 pack-year smoking history. He quit smokeless tobacco use about 8 weeks ago. His smokeless tobacco use included Chew. He reports that he does not drink alcohol or use illicit drugs.  Allergies  Allergen Reactions  . Morphine And Related Hives    Family History  Problem Relation Age of Onset  . Kidney disease Brother   . Hypertension Mother   . Diabetes Mother   . Cancer Father     type unknown  . Stroke Father      Prior to Admission medications   Medication Sig Start Date End Date Taking? Authorizing Provider  amoxicillin-clavulanate (AUGMENTIN) 400-57 MG/5ML suspension Take 400 mg by mouth 2 (two) times daily.   Yes Historical Provider, MD  flecainide (TAMBOCOR) 100 MG tablet Take 1 tablet (100 mg total) by mouth 2 (two) times daily. 07/17/14  Yes Hosie Poisson, MD  HYDROcodone-acetaminophen (HYCET) 7.5-325 mg/15 ml solution Take 10 mLs by mouth every 3 (three) hours as needed for moderate pain. 07/17/14  Yes Hosie Poisson, MD  lactose free nutrition (BOOST PLUS) LIQD Take 237 mLs by mouth 4 (four) times daily. 07/17/14  Yes Hosie Poisson, MD  lidocaine (XYLOCAINE) 2 % solution Use as directed 20 mLs in the mouth or throat every 6 (six) hours as needed for mouth pain. 06/22/14  Yes Maryanna Shape, NP  metoprolol tartrate (LOPRESSOR) 25 MG tablet Take 1 tablet (25 mg total) by mouth 2 (two) times daily. 01/22/14  Yes Fay Records, MD  midodrine (PROAMATINE) 2.5 MG tablet Take 2.5 mg by mouth 2 (two) times daily with a meal.  04/20/14  Yes Historical Provider, MD  omeprazole (PRILOSEC) 40 MG capsule Take 40 mg by mouth every morning. 07/22/14  Yes Historical Provider, MD  OVER THE COUNTER MEDICATION Take 2 tablets by mouth at bedtime. "Dulcolax with stool softener."   Yes Historical Provider, MD  oxyCODONE-acetaminophen (PERCOCET/ROXICET) 5-325 MG per tablet Take 1 tablet by mouth every 6 (six) hours as needed. For pain 06/28/14  Yes Historical Provider, MD  [place]    Yes Historical Provider, MD  simvastatin (ZOCOR) 20 MG tablet Take one tablet by mouth once daily to lower cholesterol 10/21/13  Yes [name], MD  temazepam (RESTORIL) 30 MG capsule Take 1 capsule (30 mg total) by mouth at bedtime as needed for sleep. 07/02/14  Yes [name], MD  Ipratropium-Albuterol (COMBIVENT) 20-100 MCG/ACT AERS respimat Inhale 1 puff into the lungs every 6 (six) hours. Patient not taking: Reported on 07/24/2014 06/03/14   Carlton Adam, PA-C  ipratropium-albuterol (DUONEB) 0.5-2.5 (3) MG/3ML SOLN Take 3 mLs by nebulization every 6 (six) hours. Patient not taking: Reported on 07/24/2014 07/17/14   Hosie Poisson, MD  pantoprazole sodium (PROTONIX) 40 mg/20 mL PACK Place 40 mLs (80 mg total) into feeding tube daily. Patient not taking: Reported on 07/24/2014 07/17/14   Hosie Poisson, MD   Physical Exam: Filed Vitals:   07/24/14 2030  07/24/14 2045 07/24/14 2100 07/24/14 2105  BP: 123/102 116/66 100/65   Pulse: 106 78 89 92  Temp:      TempSrc:      Resp: '17 19 13 17  '$ SpO2: 98% 99% 98% 96%    Wt Readings from Last 3 Encounters:  07/16/14 78.5 kg (173 lb 1 oz)  07/06/14 77.701 kg (171 lb 4.8 oz)  07/01/14 78.926 kg (174 lb)    General:  Appears calm and comfortable Eyes: PERRL, normal lids, irises & conjunctiva ENT: grossly normal hearing, lips & tongue Neck: no LAD, masses or thyromegaly Cardiovascular: Tachycardic at 112 bpm, irregularly irregular, no m/r/g. No LE edema. Telemetry: Atrial fibrillation  Respiratory: Bilateral rhonchi. Abdomen: soft, ntnd Skin: no rash or induration seen on limited exam Musculoskeletal: grossly normal tone BUE/BLE Psychiatric: grossly normal mood and affect, speech fluent and appropriate Neurologic: grossly non-focal.          Labs on Admission:  Basic Metabolic Panel:  Recent Labs Lab 07/24/14 1420 07/24/14 1451  NA 135  --   K 4.0  --   CL 100  --   CO2 26  --   GLUCOSE 115  --   BUN 17  --   CREATININE 0.81   --   CALCIUM 9.1  --   MG  --  2.1   Liver Function Tests: No results for input(s): AST, ALT, ALKPHOS, BILITOT, PROT, ALBUMIN in the last 168 hours. No results for input(s): LIPASE, AMYLASE in the last 168 hours. No results for input(s): AMMONIA in the last 168 hours. CBC:  Recent Labs Lab 07/24/14 1420  WBC 15.7  HGB 11.9  HCT 37.7  MCV 94.3  PLT 467   Cardiac Enzymes: No results for input(s): CKTOTAL, CKMB, CKMBINDEX, TROPONINI in the last 168 hours.  BNP (last 3 results)  Recent Labs  07/14/14 1916  BNP 333.5    ProBNP (last 3 results) No results for input(s): PROBNP in the last 8760 hours.  CBG: No results for input(s): GLUCAP in the last 168 hours.  Radiological Exams on Admission: Dg Chest 2 View  07/24/2014   CLINICAL DATA:  Shortness of breath and difficulty swallowing.  EXAM: CHEST  2 VIEW  COMPARISON:  07/14/2014  FINDINGS: Right chest wall port a catheter is noted with tip in the cavoatrial junction. The heart size appears normal. Right pleural effusion is again identified. No left effusion. Right lower lobe lung mass is stable from previous exam.  IMPRESSION: 1. No acute findings and no significant change from 07/14/2014   Electronically Signed   By: Kerby Moors M.D.   On: 07/24/2014 16:01    EKG: Independently reviewed.  Ventricular Rate: 147 PR Interval: 168 QRS Duration: 96 QT Interval: 335 QTC Calculation: 524 R Axis: 19 Atrial fibrillation ST depression, probably rate related Prolonged QT interval.   Assessment/Plan Principal Problem:   Atrial fibrillation with rapid ventricular response Active Problems:   Dysphagia    Reactive airway disease   Lung mass   History of bladder cancer   DM type 2 with diabetic peripheral neuropathy   1. Admit the patient to the telemetry unit for cardiac monitoring. Since the patient was unable to take his beta blocker and antiarrhythmic medications earlier today, I will continue that PLT S and  drip to be titrated as needed. I have also added metoprolol 2.5 mg IV push every 4 hours, since the IV administration given earlier seemed to have made the patient's rhythm converted to  sinus. 2. Will probably need an endoscopy with dilatation if possible. 3. For reactive airway disease and cough, I will switch from DuoNeb to levalbuterol and ipratropium every 6 hours via neb. 4. Should follow up with oncology as scheduled. 5.   Should follow up with oncology as scheduled. 6.   Not taking any medications moment, we will do CBG monitoring while in the hospital.  Dr. Fuller Plan was formally consulted by Dr. Darl Householder.  Code Status: Full. DVT Prophylaxis: Lovenox and SCDs. [place] Spouse home [phone] mobile [phone]  Disposition Plan: Home with outpatient follow-up.  Time spent: 60 minutes  Reubin Milan [facility] Pager [phone]

## 2014-07-24 NOTE — ED Provider Notes (Signed)
CSN: [id number]     Arrival date & time 07/24/14  1340 History   First MD Initiated Contact with Patient 07/24/14 1501     Chief Complaint  Patient presents with  . Shortness of Breath  . Dysphagia  . Chest Pain     (Consider location/radiation/quality/duration/timing/severity/associated sxs/prior Treatment) The history is provided by the patient.  Steve [name] is a 74 y.o. male hx of HL, COPD, lung cancer and he with shortness of breath, trouble swallowing. He was recently admitted for rapid A. fib, pneumonia. He finished a course of Augmentin. He has been having trouble swallowing and noticed that he has been aspirating a lot. He also has been having productive cough as well. Denies any fevers or chills. Today he has trouble keeping food down or his medicine. He is in constant chest pain since discharge. Also has some shortness of breath as well. Of note, he did have a CT angio 2 weeks ago that showed no PE.    Past Medical History  Diagnosis Date  . Dizziness and giddiness     positional vertigo  . Hyperlipidemia   . Impotence of organic origin   . Osteoarthritis, shoulder   . Allergy   . Hypothyroidism     pt denies thryoid disease, dr pandey note 11-19-12 says subclinical hypothryoid in epic  . COPD (chronic obstructive pulmonary disease)     pt denies  . Insomnia   . Hx of radiation therapy 02/2013    right lung  . Diabetes mellitus without complication     pt denies diabetes, dr pandey note says dm 11-19-12 epic  . 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 Malignant neoplasm of bladder, part unspecified   . Malignant neoplasm of bronchus and lung, unspecified site   . Atrial fibrillation    Past Surgical History  Procedure Laterality Date  . Cystoscopy w/ dilation of bladder    . Tonsillectomy  age 28  . Endobronchial ultrasound Bilateral 12/15/2012    Procedure: ENDOBRONCHIAL ULTRASOUND;  Surgeon: Brand Males, MD;  Location: [facility];   Service: Cardiopulmonary;  Laterality: Bilateral;  . Colonoscopy  2009    [facility]   Family History  Problem Relation Age of Onset  . Kidney disease Brother   . Hypertension Mother   . Diabetes Mother   . Cancer Father     type unknown  . Stroke Father    History  Substance Use Topics  . Smoking status: Former Smoker -- 1.50 packs/day for 50 years    Types: Cigarettes    Quit date: 01/09/2007  . Smokeless tobacco: Former Systems developer    Types: Chew    Quit date: 05/28/2014  . Alcohol Use: No    Review of Systems  Respiratory: Positive for shortness of breath.   Cardiovascular: Positive for chest pain.  All other systems reviewed and are negative.     Allergies  Morphine and related  Home Medications   Prior to Admission medications   Medication Sig Start Date End Date Taking? Authorizing Provider  amoxicillin-clavulanate (AUGMENTIN) 400-57 MG/5ML suspension Take 400 mg by mouth 2 (two) times daily.   Yes Historical Provider, MD  flecainide (TAMBOCOR) 100 MG tablet Take 1 tablet (100 mg total) by mouth 2 (two) times daily. 07/17/14  Yes Hosie Poisson, MD  HYDROcodone-acetaminophen (HYCET) 7.5-325 mg/15 ml solution Take 10 mLs by mouth every 3 (three) hours as needed for moderate pain. 07/17/14  Yes Hosie Poisson, MD  lactose free nutrition (BOOST PLUS) LIQD Take 237 mLs by mouth 4 (four) times daily. 07/17/14  Yes Hosie Poisson, MD  lidocaine (XYLOCAINE) 2 % solution Use as directed 20 mLs in the mouth or throat every 6 (six) hours as needed for mouth pain. 06/22/14  Yes Maryanna Shape, NP  metoprolol tartrate (LOPRESSOR) 25 MG tablet Take 1 tablet (25 mg total) by mouth 2 (two) times daily. 01/22/14  Yes Fay Records, MD  midodrine (PROAMATINE) 2.5 MG tablet Take 2.5 mg by mouth 2 (two) times daily with a meal.  04/20/14  Yes Historical Provider, MD  omeprazole (PRILOSEC) 40 MG capsule Take 40 mg by mouth every morning. 07/22/14  Yes Historical Provider, MD  OVER THE COUNTER MEDICATION  Take 2 tablets by mouth at bedtime. "Dulcolax with stool softener."   Yes Historical Provider, MD  oxyCODONE-acetaminophen (PERCOCET/ROXICET) 5-325 MG per tablet Take 1 tablet by mouth every 6 (six) hours as needed. For pain 06/28/14  Yes Historical Provider, MD  [place]   Yes Historical Provider, MD  simvastatin (ZOCOR) 20 MG tablet Take one tablet by mouth once daily to lower cholesterol 10/21/13  Yes [name], MD  temazepam (RESTORIL) 30 MG capsule Take 1 capsule (30 mg total) by mouth at bedtime as needed for sleep. 07/02/14  Yes [name], MD  Ipratropium-Albuterol (COMBIVENT) 20-100 MCG/ACT AERS respimat Inhale 1 puff into the lungs every 6 (six) hours. Patient not taking: Reported on 07/24/2014 06/03/14   Carlton Adam, PA-C  ipratropium-albuterol (DUONEB) 0.5-2.5 (3) MG/3ML SOLN Take 3 mLs by nebulization every 6 (six) hours. Patient not taking: Reported on 07/24/2014 07/17/14   Hosie Poisson, MD  pantoprazole sodium (PROTONIX) 40 mg/20 mL PACK Place 40 mLs (80 mg total) into feeding tube daily. Patient not taking: Reported on 07/24/2014 07/17/14   Hosie Poisson, MD   BP 95/58 mmHg  Pulse 121  Temp(Src) 97.4 F (36.3 C) (Oral)  Resp 16  SpO2 98% Physical Exam  Constitutional: He is oriented to person, place, and time.  Chronically ill, uncomfortable   HENT:  Head: Normocephalic.  Mouth/Throat: Oropharynx is clear and moist.  Eyes: Conjunctivae are normal. Pupils are equal, round, and reactive to light.  Neck: Normal range of motion. Neck supple.  Cardiovascular: Regular rhythm and normal heart sounds.   Slightly tachy   Pulmonary/Chest: Effort normal.  Dec breath sounds throughout, especially L side. + mild diffuse wheezing   Abdominal: Soft. Bowel sounds are normal. He exhibits no distension. There is no tenderness. There is no rebound.  Musculoskeletal: Normal range of motion. He exhibits no edema or tenderness.  Neurological: He is alert and  oriented to person, place, and time. No cranial nerve deficit. Coordination normal.  Skin: Skin is warm and dry.  Psychiatric: He has a normal mood and affect. His behavior is normal. Judgment and thought content normal.  Nursing note and vitals reviewed.   ED Course  Procedures (including critical care time)  CRITICAL CARE Performed by: Darl Householder, DAVID   Total critical care time: 30 min   Critical care time was exclusive of separately billable procedures and treating other patients.  Critical care was necessary to treat or prevent imminent or life-threatening deterioration.  Critical care was time spent personally by me on the following activities: development of treatment plan with patient and/or surrogate as well as nursing, discussions with consultants, evaluation of patient's response to treatment, examination of patient, obtaining history from patient or surrogate, ordering  and performing treatments and interventions, ordering and review of laboratory studies, ordering and review of radiographic studies, pulse oximetry and re-evaluation of patient's condition.   Labs Review Labs Reviewed  BASIC METABOLIC PANEL - Abnormal; Notable for the following:    Chloride 100 (*)    Glucose, Bld 115 (*)    All other components within normal limits  CBC - Abnormal; Notable for the following:    WBC 15.7 (*)    RBC 4.00 (*)    Hemoglobin 11.9 (*)    HCT 37.7 (*)    RDW 16.3 (*)    Platelets 467 (*)    All other components within normal limits  CULTURE, BLOOD (ROUTINE X 2)  CULTURE, BLOOD (ROUTINE X 2)  MAGNESIUM  I-STAT TROPOININ, ED  I-STAT CG4 LACTIC ACID, ED  I-STAT CG4 LACTIC ACID, ED    Imaging Review Dg Chest 2 View  07/24/2014   CLINICAL DATA:  Shortness of breath and difficulty swallowing.  EXAM: CHEST  2 VIEW  COMPARISON:  07/14/2014  FINDINGS: Right chest wall port a catheter is noted with tip in the cavoatrial junction. The heart size appears normal. Right pleural effusion  is again identified. No left effusion. Right lower lobe lung mass is stable from previous exam.  IMPRESSION: 1. No acute findings and no significant change from 07/14/2014   Electronically Signed   By: Kerby Moors M.D.   On: 07/24/2014 16:01     EKG Interpretation   Date/Time:  Saturday July 24 2014 17:50:29 EDT Ventricular Rate:  147 PR Interval:  168 QRS Duration: 96 QT Interval:  335 QTC Calculation: 524 R Axis:   19 Text Interpretation:  Atrial fibrillation ST depression, probably rate  related Prolonged QT interval rapid afib new since previous EKG  Confirmed  by YAO  MD, DAVID ([id number]) on 07/24/2014 6:32:18 PM      MDM   Final diagnoses:  None    Steve [name] is a 74 y.o. male here with chest pain, shortness of breath, trouble swallowing. Concerned for possible aspiration pneumonia from dysphagia. Will repeat sepsis workup. Will likely admit for pain control.   6 PM Patient felt better initially. CXR showed no pneumonia. WBC stable at 15. Labs unremarkable. Tolerated fluids after pain meds. I think likely pain from esophageal stricture. Consulted Dr. Collene Mares, who recommend f/u on Monday given that he is able to tolerate PO. Ambulated to the bathroom just now and then went into rapid afib. Given cardizem then started drip. Will admit for rapid afib.     Wandra Arthurs, MD 07/24/14 [phone]

## 2014-07-25 DIAGNOSIS — R1310 Dysphagia, unspecified: Secondary | ICD-10-CM

## 2014-07-25 DIAGNOSIS — R918 Other nonspecific abnormal finding of lung field: Secondary | ICD-10-CM

## 2014-07-25 LAB — COMPREHENSIVE METABOLIC PANEL
ALK PHOS: 53 U/L (ref 38–126)
ALT: 19 U/L (ref 17–63)
AST: 13 U/L — AB (ref 15–41)
Albumin: 2.5 g/dL — ABNORMAL LOW (ref 3.5–5.0)
Anion gap: 6 (ref 5–15)
BUN: 17 mg/dL (ref 6–20)
CALCIUM: 8.2 mg/dL — AB (ref 8.9–10.3)
CO2: 27 mmol/L (ref 22–32)
Chloride: 103 mmol/L (ref 101–111)
Creatinine, Ser: 0.67 mg/dL (ref 0.61–1.24)
GFR calc Af Amer: >60 mL/min (ref >60)
GFR calc non Af Amer: >60 mL/min (ref >60)
GLUCOSE: 106 mg/dL — AB (ref 65–99)
POTASSIUM: 4 mmol/L (ref 3.5–5.1)
SODIUM: 136 mmol/L (ref 135–145)
Total Bilirubin: 0.5 mg/dL (ref 0.3–1.2)
Total Protein: 6 g/dL — ABNORMAL LOW (ref 6.5–8.1)

## 2014-07-25 LAB — CBC
HEMATOCRIT: 32.3 % — AB (ref 39.0–52.0)
Hemoglobin: 10 g/dL — ABNORMAL LOW (ref 13.0–17.0)
MCH: 29.2 pg (ref 26.0–34.0)
MCHC: 31 g/dL (ref 30.0–36.0)
MCV: 94.4 fL (ref 78.0–100.0)
PLATELETS: 414 10*3/uL — AB (ref 150–400)
RBC: 3.42 MIL/uL — ABNORMAL LOW (ref 4.22–5.81)
RDW: 16.5 % — AB (ref 11.5–15.5)
WBC: 12.7 10*3/uL — ABNORMAL HIGH (ref 4.0–10.5)

## 2014-07-25 LAB — GLUCOSE, CAPILLARY
GLUCOSE-CAPILLARY: 89 mg/dL (ref 65–99)
GLUCOSE-CAPILLARY: 90 mg/dL (ref 65–99)
GLUCOSE-CAPILLARY: 94 mg/dL (ref 65–99)
GLUCOSE-CAPILLARY: 96 mg/dL (ref 65–99)
Glucose-Capillary: 99 mg/dL (ref 65–99)

## 2014-07-25 MED ORDER — FLEET ENEMA 7-19 GM/118ML RE ENEM: 1.0000 | ENEMA | Freq: Once | RECTAL | Status: DC

## 2014-07-25 MED ORDER — METOPROLOL TARTRATE 25 MG PO TABS
25.0000 mg | ORAL_TABLET | Freq: Two times a day (BID) | ORAL | Status: DC
  Administered 2014-07-25 (×2): 25 mg via ORAL
  Filled 2014-07-25 (×2): qty 1

## 2014-07-25 MED ORDER — OXYCODONE HCL 5 MG/5ML PO SOLN
5.0000 mg | ORAL | Status: DC | PRN
  Administered 2014-07-25 – 2014-07-29 (×13): 5 mg via ORAL
  Filled 2014-07-25 (×14): qty 5

## 2014-07-25 MED ORDER — HYDROMORPHONE HCL 1 MG/ML IJ SOLN
1.0000 mg | INTRAMUSCULAR | Status: DC | PRN
  Administered 2014-07-25 – 2014-07-29 (×19): 1 mg via INTRAVENOUS
  Filled 2014-07-25 (×19): qty 1

## 2014-07-25 MED ORDER — OXYCODONE HCL ER 15 MG PO T12A
15.0000 mg | EXTENDED_RELEASE_TABLET | Freq: Every day | ORAL | Status: DC
  Administered 2014-07-25 – 2014-07-28 (×3): 15 mg via ORAL
  Filled 2014-07-25 (×4): qty 1

## 2014-07-25 MED ORDER — DOCUSATE SODIUM 100 MG PO CAPS
200.0000 mg | ORAL_CAPSULE | Freq: Every day | ORAL | Status: DC
  Administered 2014-07-25 – 2014-07-26 (×2): 200 mg via ORAL
  Filled 2014-07-25 (×2): qty 2

## 2014-07-25 MED ORDER — DOCUSATE SODIUM 50 MG/5ML PO LIQD: 200.0000 mg | Freq: Every day | ORAL | Status: DC

## 2014-07-25 MED ORDER — PANTOPRAZOLE SODIUM 40 MG IV SOLR
40.0000 mg | Freq: Two times a day (BID) | INTRAVENOUS | Status: DC
  Administered 2014-07-25 – 2014-07-27 (×5): 40 mg via INTRAVENOUS
  Filled 2014-07-25 (×5): qty 40

## 2014-07-25 MED ORDER — HYDROMORPHONE HCL 1 MG/ML IJ SOLN
0.5000 mg | Freq: Once | INTRAMUSCULAR | Status: AC
  Administered 2014-07-25: 0.5 mg via INTRAVENOUS

## 2014-07-25 MED ORDER — FLECAINIDE ACETATE 100 MG PO TABS
100.0000 mg | ORAL_TABLET | Freq: Two times a day (BID) | ORAL | Status: DC
  Administered 2014-07-25 – 2014-07-27 (×5): 100 mg via ORAL
  Filled 2014-07-25 (×6): qty 1

## 2014-07-25 MED ORDER — MORPHINE SULFATE ER 15 MG PO TBCR: 15.0000 mg | EXTENDED_RELEASE_TABLET | Freq: Every day | ORAL | Status: DC

## 2014-07-25 MED ORDER — MINERAL OIL RE ENEM
1.0000 | ENEMA | Freq: Once | RECTAL | Status: AC
  Administered 2014-07-25: 1 via RECTAL
  Filled 2014-07-25 (×2): qty 1

## 2014-07-25 MED ORDER — DICLOFENAC SODIUM 1 % TD GEL
4.0000 g | Freq: Four times a day (QID) | TRANSDERMAL | Status: DC
  Administered 2014-07-25 – 2014-07-29 (×16): 4 g via TOPICAL
  Filled 2014-07-25: qty 100

## 2014-07-25 NOTE — Consult Note (Signed)
Cross cover [facility] Reason for Consult: Difficulty swallowing. Referring Physician: THP  Steve [name] is an 74 y.o. male.  HPI: 74 year old Steve [name] male with multiple medical problems listed below, recently discharged from the hospital when he was evaluated for chest pain and dysphagia. He claims he is to the point where he "cannot take it any more". He says he "cannot go on like this". He has been having to crush his medications and even then he cannot gets his food or medications down. On his discharge from the hospital recently he was advised to stay on liquids only. He feels even that is not an option for him. Every time he tries to swallow he starts to have chest pain with belching and burping and tends to regurgitate. He has had a recent EGD done by Dr. 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 He has tried viscous lidocaine without much luck. He feels the only relief he has from the chest pain which worsens with swallowing is the "pain medications' he is getting every 2 hours. He also has constipation from the chronic use of narcotics. He has had some weight loss but is not sure as to how much he has lost.    Past Medical History  Diagnosis Date  . Dizziness and giddiness     positional vertigo  . Hyperlipidemia   . Impotence of organic origin   . Osteoarthritis, shoulder   . Allergy   . Hypothyroidism     pt denies thryoid disease, dr pandey note 11-19-12 says subclinical hypothryoid in epic  . COPD (chronic obstructive pulmonary disease)     pt denies  . Insomnia   . Hx of radiation therapy 02/2013    right lung  . Diabetes mellitus without complication     pt denies diabetes, dr pandey note says dm 11-19-12 epic  . 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 Malignant neoplasm of bladder, part unspecified   . Malignant neoplasm of bronchus and lung, unspecified site   . Atrial fibrillation    Past  Surgical History  Procedure Laterality Date  . Cystoscopy w/ dilation of bladder    . Tonsillectomy  age 50  . Endobronchial ultrasound Bilateral 12/15/2012    Procedure: ENDOBRONCHIAL ULTRASOUND;  Surgeon: Brand Males, MD;  Location: [facility];  Service: Cardiopulmonary;  Laterality: Bilateral;  . Colonoscopy  2009    [facility]   Family History  Problem Relation Age of Onset  . Kidney disease Brother   . Hypertension Mother   . Diabetes Mother   . Cancer Father     type unknown  . Stroke Father    Social History:  reports that he quit smoking about 7 years ago. His smoking use included Cigarettes. He has a 75 pack-year smoking history. He quit smokeless tobacco use about 8 weeks ago. His smokeless tobacco use included Chew. He reports that he does not drink alcohol or use illicit drugs.  Allergies:  Allergies  Allergen Reactions  . Morphine And Related Hives   Medications: I have reviewed the patient's current medications.  Results for orders placed or performed during the hospital encounter of 07/24/14 (from the past 48 hour(s))  Basic metabolic panel     Status: Abnormal   Collection Time: 07/24/14  2:20 PM  Result Value Ref Range   Sodium 135 135 - 145 mmol/L   Potassium 4.0 3.5 - 5.1 mmol/L  Chloride 100 (L) 101 - 111 mmol/L   CO2 26 22 - 32 mmol/L   Glucose, Bld 115 (H) 65 - 99 mg/dL   BUN 17 6 - 20 mg/dL   Creatinine, Ser 0.81 0.61 - 1.24 mg/dL   Calcium 9.1 8.9 - 10.3 mg/dL   GFR calc non Af Amer >60 >60 mL/min   GFR calc Af Amer >60 >60 mL/min    Comment: (NOTE) The eGFR has been calculated using the CKD EPI equation. This calculation has not been validated in all clinical situations. eGFR's persistently <60 mL/min signify possible Chronic Kidney Disease.    Anion gap 9 5 - 15  CBC     Status: Abnormal   Collection Time: 07/24/14  2:20 PM  Result Value Ref Range   WBC 15.7 (H) 4.0 - 10.5 K/uL   RBC 4.00 (L) 4.22 - 5.81 MIL/uL   Hemoglobin 11.9 (L)  13.0 - 17.0 g/dL   HCT 37.7 (L) 39.0 - 52.0 %   MCV 94.3 78.0 - 100.0 fL   MCH 29.8 26.0 - 34.0 pg   MCHC 31.6 30.0 - 36.0 g/dL   RDW 16.3 (H) 11.5 - 15.5 %   Platelets 467 (H) 150 - 400 K/uL  I-stat troponin, ED     Status: None   Collection Time: 07/24/14  2:35 PM  Result Value Ref Range   Troponin i, poc 0.00 0.00 - 0.08 ng/mL   Comment 3            Comment: Due to the release kinetics of cTnI, a negative result within the first hours of the onset of symptoms does not rule out myocardial infarction with certainty. If myocardial infarction is still suspected, repeat the test at appropriate intervals.   Magnesium     Status: None   Collection Time: 07/24/14  2:51 PM  Result Value Ref Range   Magnesium 2.1 1.7 - 2.4 mg/dL  Blood culture (routine x 2)     Status: None (Preliminary result)   Collection Time: 07/24/14  3:35 PM  Result Value Ref Range   Specimen Description BLOOD RIGHT ANTECUBITAL    Special Requests BOTTLES DRAWN AEROBIC AND ANAEROBIC 5CC    Culture PENDING    Report Status PENDING   I-Stat CG4 Lactic Acid, ED     Status: None   Collection Time: 07/24/14  3:43 PM  Result Value Ref Range   Lactic Acid, Venous 1.07 0.5 - 2.0 mmol/L  Comprehensive metabolic panel     Status: Abnormal   Collection Time: 07/25/14  6:00 AM  Result Value Ref Range   Sodium 136 135 - 145 mmol/L   Potassium 4.0 3.5 - 5.1 mmol/L   Chloride 103 101 - 111 mmol/L   CO2 27 22 - 32 mmol/L   Glucose, Bld 106 (H) 65 - 99 mg/dL   BUN 17 6 - 20 mg/dL   Creatinine, Ser 0.67 0.61 - 1.24 mg/dL   Calcium 8.2 (L) 8.9 - 10.3 mg/dL   Total Protein 6.0 (L) 6.5 - 8.1 g/dL   Albumin 2.5 (L) 3.5 - 5.0 g/dL   AST 13 (L) 15 - 41 U/L   ALT 19 17 - 63 U/L   Alkaline Phosphatase 53 38 - 126 U/L   Total Bilirubin 0.5 0.3 - 1.2 mg/dL   GFR calc non Af Amer >60 >60 mL/min   GFR calc Af Amer >60 >60 mL/min    Comment: (NOTE) The eGFR has been calculated using the CKD EPI  equation. This calculation has  not been validated in all clinical situations. eGFR's persistently <60 mL/min signify possible Chronic Kidney Disease.    Anion gap 6 5 - 15  CBC     Status: Abnormal   Collection Time: 07/25/14  6:00 AM  Result Value Ref Range   WBC 12.7 (H) 4.0 - 10.5 K/uL   RBC 3.42 (L) 4.22 - 5.81 MIL/uL   Hemoglobin 10.0 (L) 13.0 - 17.0 g/dL   HCT 32.3 (L) 39.0 - 52.0 %   MCV 94.4 78.0 - 100.0 fL   MCH 29.2 26.0 - 34.0 pg   MCHC 31.0 30.0 - 36.0 g/dL   RDW 16.5 (H) 11.5 - 15.5 %   Platelets 414 (H) 150 - 400 K/uL   Dg Chest 2 View  07/24/2014   CLINICAL DATA:  Shortness of breath and difficulty swallowing.  EXAM: CHEST  2 VIEW  COMPARISON:  07/14/2014  FINDINGS: Right chest wall port a catheter is noted with tip in the cavoatrial junction. The heart size appears normal. Right pleural effusion is again identified. No left effusion. Right lower lobe lung mass is stable from previous exam.  IMPRESSION: 1. No acute findings and no significant change from 07/14/2014   Electronically Signed   By: Kerby Moors M.D.   On: 07/24/2014 16:01   Review of Systems  Constitutional: Positive for weight loss.  HENT: Negative.   Eyes: Negative.   Cardiovascular: Positive for chest pain.  Genitourinary: Negative.   Musculoskeletal: Positive for back pain.  Skin: Negative.   Neurological: Positive for weakness.  Endo/Heme/Allergies: Negative.   Psychiatric/Behavioral: Negative.    Blood pressure 110/62, pulse 87, temperature 98.4 F (36.9 C), temperature source Oral, resp. rate 18, height $RemoveBe'5\' 11"'NFfzmfchF$  (1.803 m), weight 73.4 kg (161 lb 13.1 oz), SpO2 96 %. Physical Exam  Constitutional: He is oriented to person, place, and time. He appears well-developed and well-nourished.  HENT:  Head: Normocephalic and atraumatic.  Eyes: Conjunctivae and EOM are normal. Pupils are equal, round, and reactive to light.  Neck: Normal range of motion. Neck supple.  Respiratory: Effort normal and breath sounds normal.  GI: Soft.  Bowel sounds are normal.  Musculoskeletal: Normal range of motion.  Neurological: He is alert and oriented to person, place, and time.  Skin: Skin is warm and dry.  Psychiatric: He has a normal mood and affect. His behavior is normal. Thought content normal.   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 Stenting is not considered an option. Lung cancer treated with chemotherapy and immunotherapy. He may need a PEG. Will discuss with [facility]. Dr. Olevia Perches to see tomorrow. 2) Narcotic induced constipation.  3) History of atrial fibrillation.  4) Osteoarthritis. 5) Hypothyroidism. 6) History of bladder cancer.  Vitoria Conyer 07/25/2014, 8:21 AM

## 2014-07-25 NOTE — Progress Notes (Signed)
Patient requesting an extra dose of Dilaudid, in order to help relieve pain, so that patient can go to sleep.  PCP on call was notified.

## 2014-07-25 NOTE — Progress Notes (Signed)
Pt ambulated on RA w/ 02 sat 87-92%, and on 02 at 95-100%.  Rosine Beat, RN

## 2014-07-25 NOTE — Progress Notes (Signed)
[facility] Progress Note   Steve [name] EXN:[id number] DOB: 1940/04/29 DOA: 07/24/2014 PCP: [name], MD  Brief narrative: Steve [name] is a 74 y.o. male with a history of atrial fibrillation, lung cancer, diabetes mellitus, esophagitis and esophageal ulcer found on EGD on 6/23. The patient was admitted from 7/6 through 7/9 for chest pain. He was determined to have pneumonia and was treated for this. His main complaint which was chest pain did not improve. He also had odynophagia likely secondary to above-mentioned esophagitis/ulcer and he was recommended to take a liquid diet and was placed on Protonix. He was not able to obtain the proton X for a number of days and then eventually went back to daily Prilosec. He presented again to the hospital with his ongoing and odynophagia and resultant chest pain. It was so severe that he was having trouble swallowing.  Subjective: Chest pain is uncontrolled. He was able to swallow a couple of his pills this morning though. Asking for increased pain medication. Continues to be constipated even after the Fleet enema- he did have a small bowel movement.  Assessment/Plan: Principal Problem:   Odynophagia - Esophageal narrowing -Secondary to esophagitis and extrinsic from tumor mass-increase PPI to twice a day  - Have added OxyContin for her prolonged pain control-he can continue liquid oxycodone for breakthrough pain - decided by GI that stenting would not be appropriate for this- continue treatment for lung CA to help shrink the tumor burden - full liquid diet  Active Problems:  Left-sided chest pain -This is separate from above-mentioned pain and occurs when he has to take deep frequent breaths for example when he ambulates-suspect it is musculoskeletal-increase in pain medication mentioned above might help -A CT of the chest last week on 7/6 did not reveal any underlying pathology for this pain  A. Fib ----continue flecainide and  metoprolol which he was able to tolerate today -Holding midodrine -Not on anticoagulation  Right lung mass -metastatic lung cancer -He is supposed to [facility] at [facility] to see if he needs a bronchial stent  Constipation -Due to narcotics -Repeat Fleet enema-add Colace at bedtime   Code Status: Full code Family Communication: Wife bedside Disposition Plan: Home tomorrow if he is able to swallow DVT prophylaxis: Lovenox Consultants: Procedures:  Antibiotics: Anti-infectives    None      Objective: Filed Weights   07/24/14 2221  Weight: 73.4 kg (161 lb 13.1 oz)   No intake or output data in the 24 hours ending 07/25/14 1132   Vitals Filed Vitals:   07/24/14 2105 07/24/14 2221 07/24/14 2237 07/25/14 0300  BP:   120/70 110/62  Pulse: 92  90 87  Temp:   97.8 F (36.6 C) 98.4 F (36.9 C)  TempSrc:   Oral Oral  Resp: '17  16 18  '$ Height:  '5\' 11"'$  (1.803 m)    Weight:  73.4 kg (161 lb 13.1 oz)    SpO2: 96%  99% 96%    Exam:  General:  Pt is alert, not in acute distress  HEENT: No icterus, No thrush, oral mucosa moist  Cardiovascular: regular rate and rhythm, S1/S2 No murmur  Respiratory: clear to auscultation bilaterally   Abdomen: Soft, +Bowel sounds, non tender, non distended, no guarding  MSK: No LE edema, cyanosis or clubbing  Data Reviewed: Basic Metabolic Panel:  Recent Labs Lab 07/24/14 1420 07/24/14 1451 07/25/14 0600  NA 135  --  136  K 4.0  --  4.0  CL 100*  --  103  CO2 26  --  27  GLUCOSE 115*  --  106*  BUN 17  --  17  CREATININE 0.81  --  0.67  CALCIUM 9.1  --  8.2*  MG  --  2.1  --    Liver Function Tests:  Recent Labs Lab 07/25/14 0600  AST 13*  ALT 19  ALKPHOS 53  BILITOT 0.5  PROT 6.0*  ALBUMIN 2.5*   No results for input(s): LIPASE, AMYLASE in the last 168 hours. No results for input(s): AMMONIA in the last 168 hours. CBC:  Recent Labs Lab 07/24/14 1420 07/25/14 0600  WBC 15.7* 12.7*  HGB 11.9* 10.0*  HCT 37.7*  32.3*  MCV 94.3 94.4  PLT 467* 414*   Cardiac Enzymes: No results for input(s): CKTOTAL, CKMB, CKMBINDEX, TROPONINI in the last 168 hours. BNP (last 3 results)  Recent Labs  07/14/14 1916  BNP 333.5*    ProBNP (last 3 results) No results for input(s): PROBNP in the last 8760 hours.  CBG:  Recent Labs Lab 07/25/14 0908  GLUCAP 89    Recent Results (from the past 240 hour(s))  Blood culture (routine x 2)     Status: None (Preliminary result)   Collection Time: 07/24/14  3:35 PM  Result Value Ref Range Status   Specimen Description BLOOD RIGHT ANTECUBITAL  Final   Special Requests BOTTLES DRAWN AEROBIC AND ANAEROBIC 5CC  Final   Culture PENDING  Incomplete   Report Status PENDING  Incomplete     Studies: Dg Chest 2 View  07/24/2014   CLINICAL DATA:  Shortness of breath and difficulty swallowing.  EXAM: CHEST  2 VIEW  COMPARISON:  07/14/2014  FINDINGS: Right chest wall port a catheter is noted with tip in the cavoatrial junction. The heart size appears normal. Right pleural effusion is again identified. No left effusion. Right lower lobe lung mass is stable from previous exam.  IMPRESSION: 1. No acute findings and no significant change from 07/14/2014   Electronically Signed   By: Kerby Moors M.D.   On: 07/24/2014 16:01    Scheduled Meds:  Scheduled Meds: . aspirin  300 mg Rectal Daily  . diclofenac sodium  4 g Topical QID  . docusate sodium  200 mg Oral QHS  . enoxaparin (LOVENOX) injection  40 mg Subcutaneous QHS  . flecainide  100 mg Oral BID  . metoprolol tartrate  25 mg Oral BID  . OxyCODONE  15 mg Oral QHS  . pantoprazole (PROTONIX) IV  40 mg Intravenous Q12H  . sodium chloride  10-40 mL Intracatheter Q12H   Continuous Infusions:   Time spent on care of this patient: 35 min   Kennedy, MD 07/25/2014, 11:32 AM  LOS: 1 day   [facility] Office  [phone] Pager - Text Page per [email or web address] If 7PM-7AM, please contact night-coverage  [email or web address]

## 2014-07-25 NOTE — Progress Notes (Addendum)
RN placed the bed into the "sleep mode". As per patient's request.

## 2014-07-26 ENCOUNTER — Encounter (HOSPITAL_COMMUNITY): Payer: Self-pay | Admitting: Physician Assistant

## 2014-07-26 DIAGNOSIS — C3430 Malignant neoplasm of lower lobe, unspecified bronchus or lung: Secondary | ICD-10-CM

## 2014-07-26 DIAGNOSIS — C3431 Malignant neoplasm of lower lobe, right bronchus or lung: Secondary | ICD-10-CM

## 2014-07-26 DIAGNOSIS — K222 Esophageal obstruction: Secondary | ICD-10-CM

## 2014-07-26 DIAGNOSIS — I4891 Unspecified atrial fibrillation: Secondary | ICD-10-CM

## 2014-07-26 DIAGNOSIS — E1142 Type 2 diabetes mellitus with diabetic polyneuropathy: Secondary | ICD-10-CM

## 2014-07-26 DIAGNOSIS — G629 Polyneuropathy, unspecified: Secondary | ICD-10-CM

## 2014-07-26 DIAGNOSIS — R1310 Dysphagia, unspecified: Secondary | ICD-10-CM

## 2014-07-26 DIAGNOSIS — J4530 Mild persistent asthma, uncomplicated: Secondary | ICD-10-CM

## 2014-07-26 LAB — GLUCOSE, CAPILLARY
Glucose-Capillary: 98 mg/dL (ref 65–99)
Glucose-Capillary: 106 mg/dL — ABNORMAL HIGH (ref 65–99)
Glucose-Capillary: 101 mg/dL — ABNORMAL HIGH (ref 65–99)
Glucose-Capillary: 95 mg/dL (ref 65–99)

## 2014-07-26 MED ORDER — METOPROLOL TARTRATE 1 MG/ML IV SOLN
5.0000 mg | Freq: Once | INTRAVENOUS | Status: AC
  Administered 2014-07-26: 5 mg via INTRAVENOUS
  Filled 2014-07-26 (×2): qty 5

## 2014-07-26 MED ORDER — BOOST PLUS PO LIQD
237.0000 mL | Freq: Two times a day (BID) | ORAL | Status: DC
  Administered 2014-07-26 – 2014-07-28 (×2): 237 mL via ORAL
  Filled 2014-07-26 (×6): qty 237

## 2014-07-26 MED ORDER — DILTIAZEM HCL 100 MG IV SOLR
5.0000 mg/h | INTRAVENOUS | Status: DC
  Administered 2014-07-26: 5 mg/h via INTRAVENOUS
  Filled 2014-07-26: qty 100

## 2014-07-26 MED ORDER — DILTIAZEM LOAD VIA INFUSION
10.0000 mg | Freq: Once | INTRAVENOUS | Status: AC
  Administered 2014-07-26: 10 mg via INTRAVENOUS
  Filled 2014-07-26: qty 10

## 2014-07-26 MED ORDER — DILTIAZEM HCL ER COATED BEADS 120 MG PO CP24: 120.0000 mg | ORAL_CAPSULE | Freq: Every day | ORAL | Status: DC

## 2014-07-26 MED ORDER — DILTIAZEM HCL ER COATED BEADS 180 MG PO CP24
360.0000 mg | ORAL_CAPSULE | Freq: Every day | ORAL | Status: DC
  Administered 2014-07-26 – 2014-07-27 (×2): 360 mg via ORAL
  Filled 2014-07-26 (×3): qty 2

## 2014-07-26 MED ORDER — ENOXAPARIN SODIUM 40 MG/0.4ML ~~LOC~~ SOLN
40.0000 mg | Freq: Every day | SUBCUTANEOUS | Status: DC
  Administered 2014-07-27 – 2014-07-28 (×2): 40 mg via SUBCUTANEOUS
  Filled 2014-07-26 (×2): qty 0.4

## 2014-07-26 MED ORDER — BOOST PLUS PO LIQD
237.0000 mL | Freq: Three times a day (TID) | ORAL | Status: DC
  Administered 2014-07-26: 237 mL via ORAL
  Filled 2014-07-26 (×2): qty 237

## 2014-07-26 NOTE — Progress Notes (Signed)
Reason for Consult: Odynophagia secondary to esophageal stricture  Referring Physician(s): Dr. Olevia Perches  History of Present Illness: Steve [name] is a 74 y.o. male with lung cancer. Pt has developed progressive odynophagia and is found to have significant esophageal stricture. GI was not able to pass scope. Currently pt only on liquids diet which is not enough nutrition. IR is asked to place Perc G-tube. Chart, PMHx, meds, labs, imaging reviewed.   Past Medical History  Diagnosis Date  . Dizziness and giddiness     positional vertigo  . Hyperlipidemia   . Impotence of organic origin   . Osteoarthritis, shoulder   . Allergy   . Hypothyroidism     pt denies thryoid disease, dr pandey note 11-19-12 says subclinical hypothryoid in epic  . COPD (chronic obstructive pulmonary disease)     pt denies  . Insomnia   . Hx of radiation therapy 02/2013    right lung  . Diabetes mellitus without complication     pt denies diabetes, dr pandey note says dm 11-19-12 epic  . 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 Malignant neoplasm of bladder, part unspecified   . Malignant neoplasm of bronchus and lung, unspecified site   . PAF (paroxysmal atrial fibrillation)     a. not on anticoag due to oncologic issues, on flecainide.  . Paroxysmal SVT (supraventricular tachycardia)   . Chronic respiratory failure   . Esophageal stricture   . Esophagitis   . Reactive airway disease     Past Surgical History  Procedure Laterality Date  . Cystoscopy w/ dilation of bladder    . Tonsillectomy  age 72  . Endobronchial ultrasound Bilateral 12/15/2012    Procedure: ENDOBRONCHIAL ULTRASOUND;  Surgeon: Brand Males, MD;  Location: [facility];  Service: Cardiopulmonary;  Laterality: Bilateral;  . Colonoscopy  2009    [facility]    Allergies: Morphine and related  Medications:  Current facility-administered medications:  .  aspirin suppository 300 mg, 300 mg, Rectal,  Daily, Reubin Milan, MD, 300 mg at 07/24/14 2300 .  diclofenac sodium (VOLTAREN) 1 % transdermal gel 4 g, 4 g, Topical, QID, Debbe Odea, MD, 4 g at 07/26/14 0942 .  diltiazem (CARDIZEM CD) 24 hr capsule 360 mg, 360 mg, Oral, Daily, Dayna N Dunn, PA-C, 360 mg at 07/26/14 1109 .  docusate sodium (COLACE) capsule 200 mg, 200 mg, Oral, QHS, Saima Rizwan, MD, 200 mg at 07/25/14 2210 .  [START ON 07/27/2014] enoxaparin (LOVENOX) injection 40 mg, 40 mg, Subcutaneous, QHS, Ascencion Dike, PA-C .  flecainide (TAMBOCOR) tablet 100 mg, 100 mg, Oral, BID, Debbe Odea, MD, 100 mg at 07/26/14 0943 .  HYDROmorphone (DILAUDID) injection 1 mg, 1 mg, Intravenous, Q4H PRN, Debbe Odea, MD, 1 mg at 07/26/14 1131 .  ipratropium (ATROVENT) nebulizer solution 0.5 mg, 0.5 mg, Nebulization, Q6H PRN, Reubin Milan, MD .  lactose free nutrition (BOOST PLUS) liquid 237 mL, 237 mL, Oral, TID WC, Clayton Bibles, RD, 237 mL at 07/26/14 1121 .  levalbuterol (XOPENEX) nebulizer solution 1.25 mg, 1.25 mg, Nebulization, Q6H PRN, Reubin Milan, MD .  ondansetron [facility]) tablet 4 mg, 4 mg, Oral, Q6H PRN **OR** ondansetron (ZOFRAN) injection 4 mg, 4 mg, Intravenous, Q6H PRN, Reubin Milan, MD .  OxyCODONE (OXYCONTIN) 12 hr tablet 15 mg, 15 mg, Oral, QHS, Debbe Odea, MD, 15 mg at 07/25/14 2210 .  oxyCODONE (ROXICODONE) 5 MG/5ML solution 5 mg, 5 mg, Oral, Q4H PRN,  Debbe Odea, MD, 5 mg at 07/26/14 0945 .  pantoprazole (PROTONIX) injection 40 mg, 40 mg, Intravenous, Q12H, Debbe Odea, MD, 40 mg at 07/26/14 0942 .  sodium chloride 0.9 % injection 10-40 mL, 10-40 mL, Intracatheter, PRN, Reubin Milan, MD .  sodium chloride 0.9 % injection 10-40 mL, 10-40 mL, Intracatheter, Q12H, Reubin Milan, MD, 10 mL at 07/24/14 2330  Facility-Administered Medications Ordered in Other Encounters:  .  sodium chloride 0.9 % injection 10 mL, 10 mL, Intravenous, PRN, Curt Bears, MD, 10 mL at 06/08/14 1216     Family History  Problem Relation Age of Onset  . Kidney disease Brother   . Hypertension Mother   . Diabetes Mother   . Cancer Father     type unknown  . Stroke Father     History   Social History  . Marital Status: Married    Spouse Name: N/A  . Number of Children: 3  . Years of Education: N/A   Occupational History  . business owner    Social History Main Topics  . Smoking status: Former Smoker -- 1.50 packs/day for 50 years    Types: Cigarettes    Quit date: 01/09/2007  . Smokeless tobacco: Former Systems developer    Types: Chew    Quit date: 05/28/2014  . Alcohol Use: No  . Drug Use: No  . Sexual Activity: Not on file   Other Topics Concern  . None   Social History Narrative     Review of Systems: A 12 point ROS discussed and pertinent positives are indicated in the HPI above.  All other systems are negative.  Review of Systems  Vital Signs: BP 130/84 mmHg  Pulse 115  Temp(Src) 99 F (37.2 C) (Oral)  Resp 16  Ht '5\' 11"'$  (1.803 m)  Wt 161 lb 13.1 oz (73.4 kg)  BMI 22.58 kg/m2  SpO2 93%  Physical Exam  Constitutional: He is oriented to person, place, and time. He appears well-developed. No distress.  HENT:  Mouth/Throat: Oropharynx is clear and moist.  Neck: Normal range of motion. No JVD present. No tracheal deviation present.  Cardiovascular:  Irreg irreg  Pulmonary/Chest: Effort normal and breath sounds normal. No respiratory distress.  Abdominal: Soft. Bowel sounds are normal. He exhibits no distension.  Neurological: He is alert and oriented to person, place, and time.  Psychiatric: He has a normal mood and affect. Judgment normal.    Imaging: Dg Chest 2 View  07/24/2014   CLINICAL DATA:  Shortness of breath and difficulty swallowing.  EXAM: CHEST  2 VIEW  COMPARISON:  07/14/2014  FINDINGS: Right chest wall port a catheter is noted with tip in the cavoatrial junction. The heart size appears normal. Right pleural effusion is again identified. No left  effusion. Right lower lobe lung mass is stable from previous exam.  IMPRESSION: 1. No acute findings and no significant change from 07/14/2014   Electronically Signed   By: Kerby Moors M.D.   On: 07/24/2014 16:01   Ct Angio Chest Pe W/cm &/or Wo Cm  07/14/2014   CLINICAL DATA:  74 year old male with history of lung cancer and COPD complaining of shortness of breath.  EXAM: CT ANGIOGRAPHY CHEST WITH CONTRAST  TECHNIQUE: Multidetector CT imaging of the chest was performed using the standard protocol during bolus administration of intravenous contrast. Multiplanar CT image reconstructions and MIPs were obtained to evaluate the vascular anatomy.  CONTRAST:  192m OMNIPAQUE IOHEXOL 350 MG/ML SOLN  COMPARISON:  Chest radiograph  dated 07/14/2014 and CT dated 05/14/2014.  FINDINGS: Mild emphysematous changes. There is stable appearing 7.0 x 4.5 cm right lower lobe mass with encasement of the right middle and right lower lobe bronchi and occlusion of the right lower lobe bronchus. There is associated postobstructive atelectasis versus pneumonia. Stable moderate right pleural effusion again noted. There is apparent extension of the mass into the right hilar and subcarinal region.  Mild atherosclerotic calcification of the aorta. No CT evidence of pulmonary embolism. There is encasement and complete occlusion of the right lower lobe pulmonary artery branch by the lung mass. No cardiomegaly. There is coronary vascular calcification. Small pericardial effusion.  Right pectoral Port-A-Cath with tip in central SVC. Partially visualized subcentimeter left hepatic hypodense lesion, incompletely characterized. Degenerative changes of the spine. No acute fracture.  Review of the MIP images confirms the above findings.  IMPRESSION: No CT evidence of pulmonary embolism.  Stable appearing right lower lobe mass with encasement of the right lower lobe pulmonary arteries branch as well as right lower lobe bronchus. There is  postobstructive atelectasis/ pneumonia.  Grossly stable right pleural effusion.  Scratch   Electronically Signed   By: Anner Crete M.D.   On: 07/14/2014 22:45   Dg Esophagus  06/29/2014   CLINICAL DATA:  Dysphagia and odynophagia. Choking and coughing. Solid food and liquids sticking in throat and chest. Previous radiation therapy and chemotherapy for lung carcinoma.  EXAM: ESOPHOGRAM / BARIUM SWALLOW / BARIUM TABLET STUDY  TECHNIQUE: Combined double contrast and single contrast examination performed using effervescent crystals, thick barium liquid, and thin barium liquid. The patient was observed with fluoroscopy swallowing a 13 mm barium sulphate tablet.  FLUOROSCOPY TIME:  Fluoroscopy Time:  2 minutes 1 second  Number of Acquired Images:  11  COMPARISON:  Recent chest CT on 05/14/2014  FINDINGS: No evidence of vestibular penetration or aspiration during swallowing. The pharynx has a normal appearance.  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 This corresponds with subcarinal mediastinal lymphadenopathy seen on recent chest CT.  No definite findings of esophagitis noted. Esophageal motility is within normal limits. No evidence of hiatal hernia or gastroesophgeal reflux. An ingested 58m barium tablet passed freely through the esophagus, and into the stomach.  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 This stricture does not prevent passage of liquid barium or 13 mm barium tablet.   Electronically Signed   By: JEarle GellM.D.   On: 06/29/2014 14:33   Dg Chest Port 1 View  07/14/2014   CLINICAL DATA:  Worsening shortness of breath and chest pain. Currently on chemotherapy for right lower lobe lung carcinoma.  EXAM: PORTABLE CHEST - 1 VIEW  COMPARISON:  06/10/2014  FINDINGS: Right-sided power port remains in appropriate position. Right infrahilar mass  like opacity shows no significant change allowing for differences in positioning. Pleural- parenchymal scarring at right lung base appears stable. No new areas of pulmonary opacity are identified. No evidence of pneumothorax or pleural effusion. Heart size is within normal limits.  IMPRESSION: No significant change in right infrahilar masslike opacity and right basilar scarring.   Electronically Signed   By: JEarle GellM.D.   On: 07/14/2014 19:33    Labs:  CBC:  Recent Labs  07/14/14 1915 07/16/14 0435 07/24/14 1420 07/25/14 0600  WBC 20.0* 16.1* 15.7* 12.7*  HGB 13.8 9.7* 11.9* 10.0*  HCT 43.5 29.9*  37.7* 32.3*  PLT 602* 454* 467* 414*    COAGS:  Recent Labs  11/16/13 0755 01/28/14 1725  INR 1.10 CANCELED  APTT 35 CANCELED    BMP:  Recent Labs  07/15/14 0206 07/16/14 0435 07/24/14 1420 07/25/14 0600  NA 135 135 135 136  K 3.8 3.9 4.0 4.0  CL 101 104 100* 103  CO2 '27 25 26 27  '$ GLUCOSE 115* 148* 115* 106*  BUN '18 17 17 17  '$ CALCIUM 8.8* 8.9 9.1 8.2*  CREATININE 0.72 0.62 0.81 0.67  GFRNONAA >60 >60 >60 >60  GFRAA >60 >60 >60 >60    LIVER FUNCTION TESTS:  Recent Labs  07/06/14 1325 07/14/14 1915 07/15/14 0206 07/25/14 0600  BILITOT 0.50 0.6 0.5 0.5  AST '15 16 17 '$ 13*  ALT '13 19 19 19  '$ ALKPHOS 86 81 75 53  PROT 7.2 8.3* 7.0 6.0*  ALBUMIN 2.9* 3.5 3.0* 2.5*    TUMOR MARKERS: No results for input(s): AFPTM, CEA, CA199, CHROMGRNA in the last 8760 hours.  Assessment and Plan: PCM/FTT secondary to severe esophageal stricture. Plan for percutaneous gastrostomy tube, likely push through/balloon retention. Risks and Benefits discussed with the patient including, but not limited to the need for a barium enema during the procedure, bleeding, infection, peritonitis, or damage to adjacent structures. All of the patient's questions were answered, patient is agreeable to proceed. Consent signed and in chart. NPO p MN Hold Lovenox tonight  Thank you for this  interesting consult.  I greatly enjoyed meeting Steve [name] and look forward to participating in their care.  SignedAscencion Dike 07/26/2014, 1:10 PM   I spent a total of 20 Minutes  in face to face in clinical consultation, greater than 50% of which was counseling/coordinating care for gastrostomy tube placement.

## 2014-07-26 NOTE — Care Management Important Message (Signed)
Important Message  Patient Details  Name: Steve [name] MRN: [id number] Date of Birth: 1940-06-12   Medicare Important Message Given:  [facility] notification given    Camillo Flaming 07/26/2014, 12:06 [place] Message  Patient Details  Name: Steve [name] MRN: [id number] Date of Birth: 1940-02-09   Medicare Important Message Given:  Yes-second notification given    Camillo Flaming 07/26/2014, 12:06 PM

## 2014-07-26 NOTE — Evaluation (Signed)
Physical Therapy Evaluation Patient Details Name: Steve [name] MRN: [id number] DOB: 11/28/1940 Today's Date: 07/26/2014   History of Present Illness  74 y.o. male with h/o lung cancer, s/p chemo and radiation,  peripheral neuropathy admitted with difficulty swallowing, a fib.  Clinical Impression  Pt admitted with above diagnosis. Pt currently with functional limitations due to the deficits listed below (see PT Problem List). Pt reports dyspnea and L sided chest pain with ambulation at baseline. He walked 200' with rollator independently,SaO2 93% on RA, HR 115, 3/4 dyspnea. Instructed pt in energy conservation techniques and in UE exercises with resistive band. No post acute PT needed. He would benefit from rollator for home. Pt will benefit from skilled PT to increase their independence and safety with mobility to allow discharge to the venue listed below.       Follow Up Recommendations No PT follow up    Equipment Recommendations  Other (comment) (4 wheeled walker)   Recommendations for Other Services OT consult     Precautions / Restrictions Precautions Precautions: Fall Precaution Comments: pt had several episodes of syncope related to cardiac issues which are now controlled with medication per pt/wife Restrictions Weight Bearing Restrictions: No      Mobility  Bed Mobility Overal bed mobility: Independent                Transfers Overall transfer level: Needs assistance Equipment used: 4-wheeled walker Transfers: Sit to/from Stand Sit to Stand: Supervision         General transfer comment: verbal cues for hand placement, no physical assist needed  Ambulation/Gait Ambulation/Gait assistance: Modified independent (Device/Increase time) Ambulation Distance (Feet): 200 Feet Assistive device: 4-wheeled walker Gait Pattern/deviations: WFL(Within Functional Limits) Gait velocity: WFL Gait velocity interpretation: at or above normal speed for  age/gender General Gait Details: SaO2 93% on RA walking, HR 115, 3/4 dyspnea, 7/10 L chest pain with walking (this is baseline per pt), steady, no LOB  Stairs            Wheelchair Mobility    Modified Rankin (Stroke Patients Only)       Balance Overall balance assessment: Modified Independent                                           Pertinent Vitals/Pain      Home Living Family/patient expects to be discharged to:: Private residence Living Arrangements: Spouse/significant other Available Help at Discharge: Family;Available PRN/intermittently Type of Home: House Home Access: Stairs to enter   Entrance Stairs-Number of Steps: 1 Home Layout: Two level Home Equipment: Walker - 2 wheels;Shower seat - built in;Cane - single point      Prior Function Level of Independence: Independent with assistive device(s)               Hand Dominance        Extremity/Trunk Assessment   Upper Extremity Assessment: Overall WFL for tasks assessed           Lower Extremity Assessment: Overall WFL for tasks assessed (peripheral neuropathy, feet and hands "feel cold", sensation intact to light touch)      Cervical / Trunk Assessment: Kyphotic  Communication   Communication: No difficulties  Cognition Arousal/Alertness: Awake/alert Behavior During Therapy: WFL for tasks assessed/performed Overall Cognitive Status: Within Functional Limits for tasks assessed  General Comments      Exercises Other Exercises Other Exercises: Orange theraband (level 2) -instructed pt in shoulder elevation, elbow flexion and extension       Assessment/Plan    PT Assessment Patient needs continued PT services  PT Diagnosis Acute pain;Generalized weakness   PT Problem List Decreased activity tolerance;Cardiopulmonary status limiting activity  PT Treatment Interventions Gait training;Therapeutic exercise   PT Goals (Current goals can  be found in the Care Plan section) Acute Rehab PT Goals Patient Stated Goal: to be able to walk without getting so SOB PT Goal Formulation: With patient/family Time For Goal Achievement: 08/09/14 Potential to Achieve Goals: Good    Frequency Min 3X/week   Barriers to discharge        Co-evaluation               End of Session Equipment Utilized During Treatment: Gait belt Activity Tolerance: Patient limited by fatigue;Patient limited by pain Patient left: in chair;with call bell/phone within reach;with family/visitor present Nurse Communication: Mobility status         Time: 1010-1103 PT Time Calculation (min) (ACUTE ONLY): 53 min   Charges:   PT Evaluation $Initial PT Evaluation Tier I: 1 Procedure PT Treatments $Gait Training: 8-22 mins $Therapeutic Exercise: 8-22 mins $Therapeutic Activity: 8-22 mins   PT G Codes:        Philomena Doheny 07/26/2014, 11:10 AM [phone]

## 2014-07-26 NOTE — Progress Notes (Signed)
Initial Nutrition Assessment  DOCUMENTATION CODES:   Severe malnutrition in context of chronic illness  INTERVENTION:   Bolus feedings: Initiate 1/2 can of Osmolite 1.5 QID  30 ml Prostat BID.   Provides 710 kcal and 60g of protein  Recommend monitor magnesium, potassium, and phosphorus daily for at least 3 days, MD to replete as needed, as pt is at risk for refeeding syndrome given severe malnutrition status and poor PO intake PTA.  Goal: Advance as tolerated to 6 cans of Osmolite 1.5 daily. Tube feeding regimen provides 2330 kcal (100% of needs), 119 grams of protein, and 1086 ml of H2O.  Recommend free water flushes of 60 ml before and after each bolus. Patient will require additional water intake of 240 ml BID.  RD to follow-up for initiation of tube feeding after PEG placement.  Continue Boost Plus BID, each provides 360 kcal and 14g of protein.  NUTRITION DIAGNOSIS:   Malnutrition related to chronic illness as evidenced by severe depletion of body fat, severe depletion of muscle mass, percent weight loss.  GOAL:   Patient will meet greater than or equal to 90% of their needs  MONITOR:   PO intake, Labs, Weight trends, TF tolerance, Skin, I & O's  REASON FOR ASSESSMENT:   Consult Enteral/tube feeding initiation and management  ASSESSMENT:   74 y.o. male with a history of atrial fibrillation, lung cancer, diabetes mellitus, esophagitis and esophageal ulcer found on EGD on 6/23.  Pt reports not being able to eat or drink anything since Saturday. Pt with esophageal stricture. Pt requested Boost today but was able to tolerate sips at most.  Pt scheduled for PEG placement tomorrow. RD provided TF recommendations above. Pt to discharge on bolus feeds.   Pt has lost 16 lb since 6/02 (9% weight loss x 1.5 months, significant for time frame).  Nutrition-Focused physical exam completed. Findings are severe fat depletion, severe muscle depletion, and no edema.   Labs  reviewed.  Diet Order:  Diet full liquid Room service appropriate?: Yes; Fluid consistency:: Thin Diet NPO time specified Except for: Sips with Meds  Skin:  Reviewed, no issues  Last BM:  7/16  Height:   Ht Readings from Last 1 Encounters:  07/24/14 '5\' 11"'$  (1.803 m)    Weight:   Wt Readings from Last 1 Encounters:  07/24/14 161 lb 13.1 oz (73.4 kg)    Ideal Body Weight:  78.2 kg  Wt Readings from Last 10 Encounters:  07/24/14 161 lb 13.1 oz (73.4 kg)  07/16/14 173 lb 1 oz (78.5 kg)  07/06/14 171 lb 4.8 oz (77.701 kg)  07/01/14 174 lb (78.926 kg)  06/28/14 174 lb 4 oz (79.039 kg)  06/22/14 176 lb 11.2 oz (80.151 kg)  06/14/14 177 lb (80.287 kg)  06/10/14 178 lb (80.74 kg)  06/01/14 179 lb (81.194 kg)  05/11/14 177 lb 12.8 oz (80.65 kg)    BMI:  Body mass index is 22.58 kg/(m^2).  Estimated Nutritional Needs:   Kcal:  2200-2400  Protein:  110-120g  Fluid:  2.2L/day  EDUCATION NEEDS:   Education needs addressed  Clayton Bibles, MS, RD, LDN Pager: [phone] After Hours Pager: [phone]

## 2014-07-26 NOTE — Consult Note (Signed)
Cardiology Consultation Note  Patient ID: Steve [name], MRN: [id number], DOB/AGE: 73-21-1942 73 y.o. Admit date: 07/24/2014   Date of Consult: 07/26/2014 Primary Physician: [name], MD Primary Cardiologist: Harrington Challenger  Chief Complaint: unable to swallow without pain Reason for Consultation: AF  HPI: [name] is a 74 y/o M with history of PAF (not on anticoag due to oncologic issues, on flecainide), SVT, reactive airway disease, subclinical hypothyroidism, metastatic non-small-cell lung cancer (with symptomatic/radiographic evidence of disease progression despite chemo/radiation), HLD, chronic dyspnea/respiratory failure, bladder CA who presented to [facility] with worsening odynophagia and dysphagia. Recent EGD by Dr. 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 He was admitted earlier this month for acute on chronic respiratory failure felt due to progression of malignancy +/- HCAP, with possible plan for airway stent at [facility]. CTA was negative for PE. He was found to be in AF that admission and converted on a higher dose of flecainide. He was also on diltiazem which was d/c'd prior to discharge. He has also had "24/7" chest pain for the past several months, particularly with swallowing, with prior negative troponins. Last echo 07/15/14 EF 55-60%, no RWMA. He was readmitted to [facility] 07/25/14 with severe pain on swallowing. He reports continued constant right-sided chest pain which is worse with swallowing and improved with pain medications. On Saturday 07/24/14 he couldn't even take his medications crushed, and ended up regurgitating anything that he was able to take that day. Due to persistent sx, he came to the ER. In the ER he was given a DuoNeb tx then went for enema placement due to constipation from narcotics and became tachycardic and tachypneic. He was noted to be back in AF RVR HR 140s. He received mag sulfate and 2.'5mg'$  of metoprolol and  converted to NSR in the ER. Since that time he's been on diltiazem drip, intermittently on IV metoprolol, and now back on flecainide/oral metoprolol with paroxysms of NSR and AF HR 110-120s. He has noted 40-50lb weight loss over several months' time. He reports continued "terrible" chronic DOE and discomfort with swallowing. GI saw the patient this admission and felt that the stenting is not felt to be an option and that he may require PEG. He is not really aware of his heart rhythm. Still on dilt '10mg'$ /hr.  Past Medical History  Diagnosis Date  . Dizziness and giddiness     positional vertigo  . Hyperlipidemia   . Impotence of organic origin   . Osteoarthritis, shoulder   . Allergy   . Hypothyroidism     pt denies thryoid disease, dr pandey note 11-19-12 says subclinical hypothryoid in epic  . COPD (chronic obstructive pulmonary disease)     pt denies  . Insomnia   . Hx of radiation therapy 02/2013    right lung  . Diabetes mellitus without complication     pt denies diabetes, dr pandey note says dm 11-19-12 epic  . 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 Malignant neoplasm of bladder, part unspecified   . Malignant neoplasm of bronchus and lung, unspecified site   . PAF (paroxysmal atrial fibrillation)     a. not on anticoag due to oncologic issues, on flecainide.  . Paroxysmal SVT (supraventricular tachycardia)   . Chronic respiratory failure   . Esophageal stricture   . Esophagitis   . Reactive airway disease       Most Recent Cardiac Studies: 2D Echo 07/2014 -  Left ventricle: The cavity size was normal. There was mild concentric hypertrophy. Systolic function was normal. The estimated ejection fraction was in the range of 55% to 60%. Wall motion was normal; there were no regional wall motion abnormalities. - Mitral valve: Calcified annulus.   Surgical History:  Past Surgical History  Procedure Laterality Date  . Cystoscopy w/ dilation of bladder     . Tonsillectomy  age 32  . Endobronchial ultrasound Bilateral 12/15/2012    Procedure: ENDOBRONCHIAL ULTRASOUND;  Surgeon: Brand Males, MD;  Location: [facility];  Service: Cardiopulmonary;  Laterality: Bilateral;  . Colonoscopy  2009    [facility]     Home Meds: Prior to Admission medications   Medication Sig Start Date End Date Taking? Authorizing Provider  amoxicillin-clavulanate (AUGMENTIN) 400-57 MG/5ML suspension Take 400 mg by mouth 2 (two) times daily.   Yes Historical Provider, MD  flecainide (TAMBOCOR) 100 MG tablet Take 1 tablet (100 mg total) by mouth 2 (two) times daily. 07/17/14  Yes Hosie Poisson, MD  HYDROcodone-acetaminophen (HYCET) 7.5-325 mg/15 ml solution Take 10 mLs by mouth every 3 (three) hours as needed for moderate pain. 07/17/14  Yes Hosie Poisson, MD  lactose free nutrition (BOOST PLUS) LIQD Take 237 mLs by mouth 4 (four) times daily. 07/17/14  Yes Hosie Poisson, MD  lidocaine (XYLOCAINE) 2 % solution Use as directed 20 mLs in the mouth or throat every 6 (six) hours as needed for mouth pain. 06/22/14  Yes Maryanna Shape, NP  metoprolol tartrate (LOPRESSOR) 25 MG tablet Take 1 tablet (25 mg total) by mouth 2 (two) times daily. 01/22/14  Yes Fay Records, MD  midodrine (PROAMATINE) 2.5 MG tablet Take 2.5 mg by mouth 2 (two) times daily with a meal.  04/20/14  Yes Historical Provider, MD  omeprazole (PRILOSEC) 40 MG capsule Take 40 mg by mouth every morning. 07/22/14  Yes Historical Provider, MD  OVER THE COUNTER MEDICATION Take 2 tablets by mouth at bedtime. "Dulcolax with stool softener."   Yes Historical Provider, MD  oxyCODONE-acetaminophen (PERCOCET/ROXICET) 5-325 MG per tablet Take 1 tablet by mouth every 6 (six) hours as needed. For pain 06/28/14  Yes Historical Provider, MD  [place]   Yes Historical Provider, MD  simvastatin (ZOCOR) 20 MG tablet Take one tablet by mouth once daily to lower cholesterol 10/21/13  Yes [name], MD   temazepam (RESTORIL) 30 MG capsule Take 1 capsule (30 mg total) by mouth at bedtime as needed for sleep. 07/02/14  Yes [name], MD  Ipratropium-Albuterol (COMBIVENT) 20-100 MCG/ACT AERS respimat Inhale 1 puff into the lungs every 6 (six) hours. Patient not taking: Reported on 07/24/2014 06/03/14   Carlton Adam, PA-C  ipratropium-albuterol (DUONEB) 0.5-2.5 (3) MG/3ML SOLN Take 3 mLs by nebulization every 6 (six) hours. Patient not taking: Reported on 07/24/2014 07/17/14   Hosie Poisson, MD  pantoprazole sodium (PROTONIX) 40 mg/20 mL PACK Place 40 mLs (80 mg total) into feeding tube daily. Patient not taking: Reported on 07/24/2014 07/17/14   Hosie Poisson, MD    Inpatient Medications:  . aspirin  300 mg Rectal Daily  . diclofenac sodium  4 g Topical QID  . docusate sodium  200 mg Oral QHS  . enoxaparin (LOVENOX) injection  40 mg Subcutaneous QHS  . flecainide  100 mg Oral BID  . metoprolol tartrate  25 mg Oral BID  . OxyCODONE  15 mg Oral QHS  . pantoprazole (PROTONIX) IV  40 mg Intravenous Q12H  .  sodium chloride  10-40 mL Intracatheter Q12H      Allergies:  Allergies  Allergen Reactions  . Morphine And Related Hives    History   Social History  . Marital Status: Married    Spouse Name: N/A  . Number of Children: 3  . Years of Education: N/A   Occupational History  . business owner    Social History Main Topics  . Smoking status: Former Smoker -- 1.50 packs/day for 50 years    Types: Cigarettes    Quit date: 01/09/2007  . Smokeless tobacco: Former Systems developer    Types: Chew    Quit date: 05/28/2014  . Alcohol Use: No  . Drug Use: No  . Sexual Activity: Not on file   Other Topics Concern  . Not on file   Social History Narrative     Family History  Problem Relation Age of Onset  . Kidney disease Brother   . Hypertension Mother   . Diabetes Mother   . Cancer Father     type unknown  . Stroke Father      Review of Systems:No bleeding issues. No fever. All  other systems reviewed and are otherwise negative except as noted above.  Labs:  Lab Results  Component Value Date   WBC 12.7* 07/25/2014   HGB 10.0* 07/25/2014   HCT 32.3* 07/25/2014   MCV 94.4 07/25/2014   PLT 414* 07/25/2014    Recent Labs Lab 07/25/14 0600  NA 136  K 4.0  CL 103  CO2 27  BUN 17  CREATININE 0.67  CALCIUM 8.2*  PROT 6.0*  BILITOT 0.5  ALKPHOS 53  ALT 19  AST 13*  GLUCOSE 106*   Lab Results  Component Value Date   CHOL 180 04/28/2013   HDL 37* 04/28/2013   LDLCALC 110* 04/28/2013   TRIG 167* 04/28/2013   Radiology/Studies:  Dg Chest 2 View  07/24/2014   CLINICAL DATA:  Shortness of breath and difficulty swallowing.  EXAM: CHEST  2 VIEW  COMPARISON:  07/14/2014  FINDINGS: Right chest wall port a catheter is noted with tip in the cavoatrial junction. The heart size appears normal. Right pleural effusion is again identified. No left effusion. Right lower lobe lung mass is stable from previous exam.  IMPRESSION: 1. No acute findings and no significant change from 07/14/2014   Electronically Signed   By: Kerby Moors M.D.   On: 07/24/2014 16:01   Ct Angio Chest Pe W/cm &/or Wo Cm  07/14/2014   CLINICAL DATA:  74 year old male with history of lung cancer and COPD complaining of shortness of breath.  EXAM: CT ANGIOGRAPHY CHEST WITH CONTRAST  TECHNIQUE: Multidetector CT imaging of the chest was performed using the standard protocol during bolus administration of intravenous contrast. Multiplanar CT image reconstructions and MIPs were obtained to evaluate the vascular anatomy.  CONTRAST:  12m OMNIPAQUE IOHEXOL 350 MG/ML SOLN  COMPARISON:  Chest radiograph dated 07/14/2014 and CT dated 05/14/2014.  FINDINGS: Mild emphysematous changes. There is stable appearing 7.0 x 4.5 cm right lower lobe mass with encasement of the right middle and right lower lobe bronchi and occlusion of the right lower lobe bronchus. There is associated postobstructive atelectasis versus  pneumonia. Stable moderate right pleural effusion again noted. There is apparent extension of the mass into the right hilar and subcarinal region.  Mild atherosclerotic calcification of the aorta. No CT evidence of pulmonary embolism. There is encasement and complete occlusion of the right lower lobe pulmonary artery branch  by the lung mass. No cardiomegaly. There is coronary vascular calcification. Small pericardial effusion.  Right pectoral Port-A-Cath with tip in central SVC. Partially visualized subcentimeter left hepatic hypodense lesion, incompletely characterized. Degenerative changes of the spine. No acute fracture.  Review of the MIP images confirms the above findings.  IMPRESSION: No CT evidence of pulmonary embolism.  Stable appearing right lower lobe mass with encasement of the right lower lobe pulmonary arteries branch as well as right lower lobe bronchus. There is postobstructive atelectasis/ pneumonia.  Grossly stable right pleural effusion.  Scratch   Electronically Signed   By: Anner Crete M.D.   On: 07/14/2014 22:45   Dg Esophagus  06/29/2014   CLINICAL DATA:  Dysphagia and odynophagia. Choking and coughing. Solid food and liquids sticking in throat and chest. Previous radiation therapy and chemotherapy for lung carcinoma.  EXAM: ESOPHOGRAM / BARIUM SWALLOW / BARIUM TABLET STUDY  TECHNIQUE: Combined double contrast and single contrast examination performed using effervescent crystals, thick barium liquid, and thin barium liquid. The patient was observed with fluoroscopy swallowing a 13 mm barium sulphate tablet.  FLUOROSCOPY TIME:  Fluoroscopy Time:  2 minutes 1 second  Number of Acquired Images:  11  COMPARISON:  Recent chest CT on 05/14/2014  FINDINGS: No evidence of vestibular penetration or aspiration during swallowing. The pharynx has a normal appearance.  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 This corresponds with subcarinal mediastinal  lymphadenopathy seen on recent chest CT.  No definite findings of esophagitis noted. Esophageal motility is within normal limits. No evidence of hiatal hernia or gastroesophgeal reflux. An ingested 95m barium tablet passed freely through the esophagus, and into the stomach.  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 This stricture does not prevent passage of liquid barium or 13 mm barium tablet.   Electronically Signed   By: JEarle GellM.D.   On: 06/29/2014 14:33   Dg Chest Port 1 View  07/14/2014   CLINICAL DATA:  Worsening shortness of breath and chest pain. Currently on chemotherapy for right lower lobe lung carcinoma.  EXAM: PORTABLE CHEST - 1 VIEW  COMPARISON:  06/10/2014  FINDINGS: Right-sided power port remains in appropriate position. Right infrahilar mass like opacity shows no significant change allowing for differences in positioning. Pleural- parenchymal scarring at right lung base appears stable. No new areas of pulmonary opacity are identified. No evidence of pneumothorax or pleural effusion. Heart size is within normal limits.  IMPRESSION: No significant change in right infrahilar masslike opacity and right basilar scarring.   Electronically Signed   By: JEarle GellM.D.   On: 07/14/2014 19:33    Wt Readings from Last 3 Encounters:  07/24/14 161 lb 13.1 oz (73.4 kg)  07/16/14 173 lb 1 oz (78.5 kg)  07/06/14 171 lb 4.8 oz (77.701 kg)    EKG:  07/24/14: NSR, QTC 444 07/24/14: atrial fib 135bpm nonspecific ST-T changes  Physical Exam: Blood pressure 100/64, pulse 109, temperature 99 F (37.2 C), temperature source Oral, resp. rate 16, height '5\' 11"'$  (1.803 m), weight 161 lb 13.1 oz (73.4 kg), SpO2 98 %. General: Thin WM in no acute distress. Head: Normocephalic, atraumatic, sclera non-icteric, no xanthomas, nares are without discharge.  Neck: JVD not  elevated. Lungs: Significant rhonchi on the left side, mild rhonchi on the right, coarse BS throughout, no wheezing or rales. Breathing is unlabored. Heart: Irregularly irregular,  mildly tachycardic with S1 S2. No murmurs, rubs, or gallops appreciated. Abdomen: Soft, non-tender, non-distended with normoactive bowel sounds. No hepatomegaly. No rebound/guarding. No obvious abdominal masses. Msk:  Strength and tone appear normal for age. Extremities: No clubbing or cyanosis. No edema.  Distal pedal pulses are 2+ and equal bilaterally. Neuro: Alert and oriented X 3. No facial asymmetry. No focal deficit. Moves all extremities spontaneously. Psych:  Responds to questions appropriately with a normal affect.    Assessment and Plan:   1. Persistent odynophagia/dysphagia and chest discomfort with recent esophageal stricture from extrinsic compression and esophagitis 2. Metastatic lung cancer 3. Chronic respiratory failure 4. Paroxysmal atrial fibrillation 5. H/o subclinical hypothyroidism with normal TSH in 06/2014  Suspect atrial fib recurrence brought on by recurrent pain and dyspnea, as well as missing his medicines on Saturday in the setting of severe odynophagia and regurgitation. Given reactive airway disease we feel diltiazem would probably be a better choice for rate control than metoprolol. Per d/w MD, will discontinue metoprolol & start Diltiazem '360mg'$  daily to reduce the number of pills he requires daily. If the '360mg'$  capsule is too big for him to swallow he may need to try smaller capsule sizes to accomplish this dose. Agree with continuing current (higher) dose of flecainide for now. Not previously felt to be a candidate for anticoagulation due to malignancy problems. We can revisit this as an outpatient depending on improvement. Tight control of rhythm may be difficult in the face of numerous medical issues. Watch BP. May be worthwhile to get oncology input this admission given recurrent  problems.   SignedMelina Copa PA-C 07/26/2014, 9:08 AM Pager: [phone]  Patient seen and examined and history reviewed. Agree with above findings and plan. 74 yo WM seen for Pafib. History of metastatic and progressive lung CA with esophageal compression. Chronic pain and cough. Now with intermittent AFib with RVR. It has been difficult for him to take his pills due to dsyphagia and esophageal stricture. Will continue Flecainide 100 mg bid. Will try Cardizem CD only for rate control to try and minimize any bronchospasm from metoprolol. I think that his Afib will be increasingly hard to control with his progressive CA. Fortunately he is minimally symptomatic with his arrhythmia.   Peter Martinique, [place] 07/26/2014 9:41 AM

## 2014-07-26 NOTE — Progress Notes (Signed)
[facility] Progress Note   Steve [name] YJE:[id number] DOB: [birth year] DOA: 07/24/2014 PCP: [name], MD  Brief narrative: Steve [name] is a 74 y.o. male with a history of atrial fibrillation, lung cancer, diabetes mellitus, esophagitis and esophageal ulcer found on EGD on 6/23. The patient was admitted from 7/6 through 7/9 for chest pain. He was determined to have pneumonia and was treated for this. His main complaint which was chest pain did not improve. He also had odynophagia likely secondary to above-mentioned esophagitis/ulcer and he was recommended to take a liquid diet and was placed on Protonix. He was not able to obtain the proton X for a number of days and then eventually went back to daily Prilosec. He presented again to the hospital with his ongoing and odynophagia and resultant chest pain. It was so severe that he was having trouble swallowing.  Subjective: Chest pain recurrs when ambulating a long distance- was able to ambulate back and forth to the ends of the hall and back to his room  - able to drink fluid and take medications again since yesterday.   Assessment/Plan: Principal Problem:   Odynophagia - Esophageal narrowing -Secondary to esophagitis and extrinsic from tumor mass-increase PPI to twice a day  - Have added OxyContin for her prolonged pain control-he can continue liquid oxycodone for breakthrough pain - decided by GI that stenting would not be appropriate for this- continue treatment for lung CA to help shrink the tumor burden - full liquid diet - GI has decided today to place PEG tube for nutrition   Active Problems:  Left-sided chest pain -This is separate from above-mentioned pain and occurs when he has to take deep frequent breaths for example when he ambulates-suspect it is musculoskeletal-increased in pain medication as mentioned above-interestingly pain is not limiting his ambulation - per pt eval- he does not need HHPT -A CT of the  chest last week on 7/6 did not reveal any underlying pathology for this pain  A. Fib - HR uncontrolled since last night despite cardizem infusion in addition to Flecainide and Metoprolol-   called for cardiology eval- they saw him on the recent admission and increased his Flecainide dose- they have decided today to stop Metoprolol and start Oral Cardizem 360 mg daily -Not on anticoagulation due to Cancer  Right lung mass -metastatic lung cancer -He is supposed to [facility] to see if he needs a bronchial stent  Constipation? -no BM after 2 enemas therefore likely does not have much stool- he has not been eating much and this may be why he has had no BMs - added Colace at bedtime as he is on narcotics   Code Status: Full code Family Communication: Wife bedside Disposition Plan: Home tomorrow if he is able to swallow DVT prophylaxis: Lovenox Consultants: Procedures:  Antibiotics: Anti-infectives    None      Objective: Filed Weights   07/24/14 2221  Weight: 73.4 kg (161 lb 13.1 oz)    Intake/Output Summary (Last 24 hours) at 07/26/14 1315 Last data filed at 07/26/14 0700  Gross per 24 hour  Intake 537.17 ml  Output      0 ml  Net 537.17 ml     Vitals Filed Vitals:   07/25/14 2021 07/26/14 0300 07/26/14 0419 07/26/14 1109  BP: 132/73 113/71 100/64 130/84  Pulse: 91 116 109 115  Temp: 98.3 F (36.8 C)  99 F (37.2 C)   TempSrc: Oral  Oral  Resp: 16  16   Height:      Weight:      SpO2: 96% 97% 98% 93%    Exam:  General:  Pt is alert, not in acute distress  HEENT: No icterus, No thrush, oral mucosa moist  Cardiovascular: regular rate and rhythm, S1/S2 No murmur  Respiratory: clear to auscultation bilaterally   Abdomen: Soft, +Bowel sounds, non tender, non distended, no guarding  MSK: No LE edema, cyanosis or clubbing  Data Reviewed: Basic Metabolic Panel:  Recent Labs Lab 07/24/14 1420 07/24/14 1451 07/25/14 0600  NA 135  --  136  K  4.0  --  4.0  CL 100*  --  103  CO2 26  --  27  GLUCOSE 115*  --  106*  BUN 17  --  17  CREATININE 0.81  --  0.67  CALCIUM 9.1  --  8.2*  MG  --  2.1  --    Liver Function Tests:  Recent Labs Lab 07/25/14 0600  AST 13*  ALT 19  ALKPHOS 53  BILITOT 0.5  PROT 6.0*  ALBUMIN 2.5*   No results for input(s): LIPASE, AMYLASE in the last 168 hours. No results for input(s): AMMONIA in the last 168 hours. CBC:  Recent Labs Lab 07/24/14 1420 07/25/14 0600  WBC 15.7* 12.7*  HGB 11.9* 10.0*  HCT 37.7* 32.3*  MCV 94.3 94.4  PLT 467* 414*   Cardiac Enzymes: No results for input(s): CKTOTAL, CKMB, CKMBINDEX, TROPONINI in the last 168 hours. BNP (last 3 results)  Recent Labs  07/14/14 1916  BNP 333.5*    ProBNP (last 3 results) No results for input(s): PROBNP in the last 8760 hours.  CBG:  Recent Labs Lab 07/25/14 1245 07/25/14 1638 07/25/14 2124 07/26/14 0757 07/26/14 1129  GLUCAP 99 94 96 98 106*    Recent Results (from the past 240 hour(s))  Blood culture (routine x 2)     Status: None (Preliminary result)   Collection Time: 07/24/14  3:35 PM  Result Value Ref Range Status   Specimen Description BLOOD RIGHT ANTECUBITAL  Final   Special Requests BOTTLES DRAWN AEROBIC AND ANAEROBIC 5CC  Final   Culture   Final    NO GROWTH 2 DAYS Performed at [facility]    Report Status PENDING  Incomplete  Blood culture (routine x 2)     Status: None (Preliminary result)   Collection Time: 07/24/14  3:38 PM  Result Value Ref Range Status   Specimen Description BLOOD LEFT HAND  Final   Special Requests BOTTLES DRAWN AEROBIC AND ANAEROBIC 3CC EACH  Final   Culture   Final    NO GROWTH 2 DAYS Performed at [facility]    Report Status PENDING  Incomplete     Studies: Dg Chest 2 View  07/24/2014   CLINICAL DATA:  Shortness of breath and difficulty swallowing.  EXAM: CHEST  2 VIEW  COMPARISON:  07/14/2014  FINDINGS: Right chest wall port a catheter is  noted with tip in the cavoatrial junction. The heart size appears normal. Right pleural effusion is again identified. No left effusion. Right lower lobe lung mass is stable from previous exam.  IMPRESSION: 1. No acute findings and no significant change from 07/14/2014   Electronically Signed   By: Kerby Moors M.D.   On: 07/24/2014 16:01    Scheduled Meds:  Scheduled Meds: . aspirin  300 mg Rectal Daily  . diclofenac sodium  4 g Topical QID  .  diltiazem  360 mg Oral Daily  . docusate sodium  200 mg Oral QHS  . [START ON 07/27/2014] enoxaparin (LOVENOX) injection  40 mg Subcutaneous QHS  . flecainide  100 mg Oral BID  . lactose free nutrition  237 mL Oral TID WC  . OxyCODONE  15 mg Oral QHS  . pantoprazole (PROTONIX) IV  40 mg Intravenous Q12H  . sodium chloride  10-40 mL Intracatheter Q12H   Continuous Infusions:   Time spent on care of this patient: 35 min   [place], MD 07/26/2014, 1:15 PM  LOS: 2 days   [facility] Office  [phone] Pager - Text Page per [email or web address] If 7PM-7AM, please contact night-coverage [email or web address]

## 2014-07-26 NOTE — Progress Notes (Signed)
     [facility] Progress Note  Subjective:  Patient and his wife are agreeable to feeding tube.  Patient unable to obtain the calories that he needs by mouth.  Objective:  Vital signs in last 24 hours: Temp:  [97.6 F (36.4 C)-99 F (37.2 C)] 99 F (37.2 C) (07/18 0419) Pulse Rate:  [82-116] 109 (07/18 0419) Resp:  [16] 16 (07/18 0419) BP: (100-132)/(64-84) 130/84 mmHg (07/18 1109) SpO2:  [96 %-100 %] 98 % (07/18 0419) Last BM Date: 07/24/14 General:  Alert, thin, in NAD Heart:  Irregularly irregular Pulm:  Coarse BS noted. Abdomen:  Soft, non-distended. Normal bowel sounds.  Non-tender. Extremities:  Without edema. Neurologic:  Alert and  oriented x4;  grossly normal neurologically. Psych:  Alert and cooperative. Normal mood and affect.  Intake/Output from previous day: 07/17 0701 - 07/18 0700 In: 537.2 [P.O.:480; I.V.:17.2] Out: -   Lab Results:  Recent Labs  07/24/14 1420 07/25/14 0600  WBC 15.7* 12.7*  HGB 11.9* 10.0*  HCT 37.7* 32.3*  PLT 467* 414*   BMET  Recent Labs  07/24/14 1420 07/25/14 0600  NA 135 136  K 4.0 4.0  CL 100* 103  CO2 26 27  GLUCOSE 115* 106*  BUN 17 17  CREATININE 0.81 0.67  CALCIUM 9.1 8.2*   LFT  Recent Labs  07/25/14 0600  PROT 6.0*  ALBUMIN 2.5*  AST 13*  ALT 19  ALKPHOS 53  BILITOT 0.5   Dg Chest 2 View  07/24/2014   CLINICAL DATA:  Shortness of breath and difficulty swallowing.  EXAM: CHEST  2 VIEW  COMPARISON:  07/14/2014  FINDINGS: Right chest wall port a catheter is noted with tip in the cavoatrial junction. The heart size appears normal. Right pleural effusion is again identified. No left effusion. Right lower lobe lung mass is stable from previous exam.  IMPRESSION: 1. No acute findings and no significant change from 07/14/2014   Electronically Signed   By: Kerby Moors M.D.   On: 07/24/2014 16:01   Assessment / Plan: 1) Dysphagia/Odynophagia with history of erosive esophagitis and a stricture  from extrinsic compression secondary to lung cancer/lymphadenopathy. Stenting/dilation is not considered an option.  May improve if cancer responds to treatment, but for now needs some source of nutrition.  Patient and his wife are agreeable to gastrostomy tube. 2) Narcotic induced constipation.  3) Atrial fibrillation with RVR:  Not on anti-coagulation.  4) Osteoarthritis. 5) Hypothyroidism. 6) History of bladder cancer.  *Will consult IR for gastrostomy tube placement.  Patient can get nutrition via tube and eat for pleasure as tolerated.   LOS: 2 days   ZEHR, JESSICA D.  07/26/2014, 11:11 AM  Pager number [phone]  Attending MD note:   I have taken a history, examined the patient, and reviewed the chart. I agree with the Advanced Practitioner's impression and recommendations. 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 He and his wife agree with gastrostomy placement. Recommend placement in Radiology due to severe restriction of the esophagus.which may preclude passage of the scope and the gastrostomy T-bumper. Please let us know if further help needed. Pt has been scheduled for IR placement tomorrow.  [facility] Pager # [phone]

## 2014-07-27 ENCOUNTER — Inpatient Hospital Stay (HOSPITAL_COMMUNITY): Payer: Medicare HMO

## 2014-07-27 ENCOUNTER — Telehealth: Payer: Self-pay | Admitting: Medical Oncology

## 2014-07-27 LAB — CBC
HEMATOCRIT: 33.9 % — AB (ref 39.0–52.0)
Hemoglobin: 10.6 g/dL — ABNORMAL LOW (ref 13.0–17.0)
MCH: 29.5 pg (ref 26.0–34.0)
MCHC: 31.3 g/dL (ref 30.0–36.0)
MCV: 94.4 fL (ref 78.0–100.0)
PLATELETS: 463 10*3/uL — AB (ref 150–400)
RBC: 3.59 MIL/uL — ABNORMAL LOW (ref 4.22–5.81)
RDW: 16.2 % — AB (ref 11.5–15.5)
WBC: 12.3 10*3/uL — AB (ref 4.0–10.5)

## 2014-07-27 LAB — BASIC METABOLIC PANEL
Anion gap: 9 (ref 5–15)
BUN: 10 mg/dL (ref 6–20)
CHLORIDE: 100 mmol/L — AB (ref 101–111)
CO2: 25 mmol/L (ref 22–32)
Calcium: 8.8 mg/dL — ABNORMAL LOW (ref 8.9–10.3)
Creatinine, Ser: 0.62 mg/dL (ref 0.61–1.24)
GFR calc Af Amer: >60 mL/min (ref >60)
GFR calc non Af Amer: >60 mL/min (ref >60)
GLUCOSE: 99 mg/dL (ref 65–99)
Potassium: 3.8 mmol/L (ref 3.5–5.1)
Sodium: 134 mmol/L — ABNORMAL LOW (ref 135–145)

## 2014-07-27 LAB — GLUCOSE, CAPILLARY
Glucose-Capillary: 103 mg/dL — ABNORMAL HIGH (ref 65–99)
Glucose-Capillary: 92 mg/dL (ref 65–99)
Glucose-Capillary: 160 mg/dL — ABNORMAL HIGH (ref 65–99)

## 2014-07-27 MED ORDER — MIDAZOLAM HCL 2 MG/2ML IJ SOLN
INTRAMUSCULAR | Status: AC
  Filled 2014-07-27: qty 6

## 2014-07-27 MED ORDER — FLECAINIDE ACETATE 100 MG PO TABS
100.0000 mg | ORAL_TABLET | Freq: Two times a day (BID) | ORAL | Status: DC
  Administered 2014-07-28 – 2014-07-29 (×3): 100 mg
  Filled 2014-07-27 (×5): qty 1

## 2014-07-27 MED ORDER — GLUCAGON HCL RDNA (DIAGNOSTIC) 1 MG IJ SOLR
INTRAMUSCULAR | Status: AC
  Filled 2014-07-27: qty 1

## 2014-07-27 MED ORDER — IOHEXOL 300 MG/ML  SOLN
25.0000 mL | Freq: Once | INTRAMUSCULAR | Status: AC | PRN
  Administered 2014-07-27: 25 mL

## 2014-07-27 MED ORDER — TEMAZEPAM 15 MG PO CAPS
30.0000 mg | ORAL_CAPSULE | Freq: Every evening | ORAL | Status: DC | PRN
  Administered 2014-07-29: 30 mg
  Filled 2014-07-27: qty 2

## 2014-07-27 MED ORDER — DILTIAZEM HCL 60 MG PO TABS: 120.0000 mg | ORAL_TABLET | Freq: Three times a day (TID) | ORAL | Status: DC

## 2014-07-27 MED ORDER — CEFAZOLIN SODIUM-DEXTROSE 2-3 GM-% IV SOLR
2.0000 g | INTRAVENOUS | Status: AC
  Administered 2014-07-27: 2 g via INTRAVENOUS

## 2014-07-27 MED ORDER — BACITRACIN-NEOMYCIN-POLYMYXIN 400-5-5000 EX OINT
1.0000 "application " | TOPICAL_OINTMENT | Freq: Every day | CUTANEOUS | Status: DC
  Administered 2014-07-29: 1 via TOPICAL

## 2014-07-27 MED ORDER — LIDOCAINE VISCOUS 2 % MT SOLN
OROMUCOSAL | Status: AC
  Administered 2014-07-27: 17:00:00 via INTRAUTERINE
  Filled 2014-07-27: qty 15

## 2014-07-27 MED ORDER — FENTANYL CITRATE (PF) 100 MCG/2ML IJ SOLN
INTRAMUSCULAR | Status: AC | PRN
  Administered 2014-07-27 (×2): 25 ug via INTRAVENOUS
  Administered 2014-07-27: 50 ug via INTRAVENOUS

## 2014-07-27 MED ORDER — FENTANYL CITRATE (PF) 100 MCG/2ML IJ SOLN
INTRAMUSCULAR | Status: AC
  Filled 2014-07-27: qty 4

## 2014-07-27 MED ORDER — PANTOPRAZOLE SODIUM 40 MG IV SOLR
40.0000 mg | INTRAVENOUS | Status: DC
  Administered 2014-07-28 – 2014-07-29 (×2): 40 mg via INTRAVENOUS
  Filled 2014-07-27 (×2): qty 40

## 2014-07-27 MED ORDER — CEFAZOLIN SODIUM-DEXTROSE 2-3 GM-% IV SOLR
INTRAVENOUS | Status: AC
  Filled 2014-07-27: qty 50

## 2014-07-27 MED ORDER — MIDAZOLAM HCL 2 MG/2ML IJ SOLN
INTRAMUSCULAR | Status: AC | PRN
  Administered 2014-07-27: 1 mg via INTRAVENOUS
  Administered 2014-07-27 (×3): 0.5 mg via INTRAVENOUS

## 2014-07-27 MED ORDER — TEMAZEPAM 15 MG PO CAPS: 30.0000 mg | ORAL_CAPSULE | Freq: Every evening | ORAL | Status: DC | PRN

## 2014-07-27 MED ORDER — DILTIAZEM 12 MG/ML ORAL SUSPENSION
90.0000 mg | Freq: Four times a day (QID) | ORAL | Status: DC
  Filled 2014-07-27 (×6): qty 9

## 2014-07-27 MED ORDER — LIDOCAINE HCL 1 % IJ SOLN
INTRAMUSCULAR | Status: AC
  Filled 2014-07-27: qty 20

## 2014-07-27 MED ORDER — SENNOSIDES-DOCUSATE SODIUM 8.6-50 MG PO TABS
2.0000 | ORAL_TABLET | Freq: Two times a day (BID) | ORAL | Status: DC
  Administered 2014-07-28 – 2014-07-29 (×3): 2
  Filled 2014-07-27 (×4): qty 2

## 2014-07-27 NOTE — Telephone Encounter (Signed)
I told Steve [name] will see pt today or tomorrow.

## 2014-07-27 NOTE — Procedures (Signed)
Interventional Radiology Procedure Note  Procedure: Placement of a percutaneous gastrostomy tube.  55F.  Complications: None Recommendations:  - Low wall suction for 24 hours. - No meds or nutrition for 24 hours.  - Routine care  Signed,  Dulcy Fanny. Earleen Newport, DO

## 2014-07-27 NOTE — Progress Notes (Signed)
Spoke with pt and wife at bedside concerning [facility] needs.  Pt and wife had questions concerning when he go home with new PEG tube;care of the PEG, how much food to put in it and where to get supplements from. Pt selected [facility] for [facility]. Referral given to in house rep with [facility].

## 2014-07-27 NOTE — Progress Notes (Signed)
[facility] Progress Note   TAYVIAN HOLYCROSS DJM:[id number] DOB: 1940-07-19 DOA: 07/24/2014 PCP: [name], MD  Brief narrative: Steve [name] is a 74 y.o. male with a history of atrial fibrillation, lung cancer, diabetes mellitus, esophagitis and esophageal ulcer found on EGD on 6/23. The patient was admitted from 7/6 through 7/9 for chest pain. He was determined to have pneumonia and was treated for this. His main complaint which was chest pain did not improve. He also had odynophagia likely secondary to above-mentioned esophagitis/ulcer and he was recommended to take a liquid diet and was placed on Protonix. He was not able to obtain the proton X for a number of days and then eventually went back to daily Prilosec. He presented again to the hospital with his ongoing and odynophagia and resultant chest pain. It was so severe that he was having trouble swallowing.  Subjective: No complaints other than recurrence of chest pain with ambulation. No nausea, vomiting, abdominal pain or diarrhea. Awaiting PEG tube placement.  Assessment/Plan: Principal Problem:   Odynophagia - Esophageal narrowing - Secondary to extrinsic from tumor mass  -Decided by GI that stenting would not be appropriate for this- continue treatment for lung CA to help shrink the tumor burden - full liquid diet - GI has decided to place PEG tube for nutrition - although he can swallow, PO intake is poor - Have added OxyContin at bedtime for prolonged pain control-he can continue liquid oxycodone for breakthrough pain - all meds can be changed to per tube after PEG today except Oxycontin- if he cannot swallow it, can switch to a Fentanyl patch - Nutrition consulted and advised on bolus feeds - can start when ok with GI  - have requested face to face for [facility]- no need for PT per PT eval-   Active Problems:  Left-sided chest pain -This is separate from above-mentioned pain and occurs when he has to take  deep frequent breaths for example when he ambulates-suspect it is musculoskeletal-increased in pain medication as mentioned above-interestingly pain is not limiting his ambulation - per pt eval, he does not need HHPT -A CT of the chest last week on 7/6 did not reveal any underlying pathology for this pain  A. Fib - HR uncontrolled since last night despite cardizem infusion in addition to Flecainide and Metoprolol-   called for cardiology eval- they saw him on the recent admission and increased his Flecainide dose- they have decided today to stop Metoprolol and start Oral Cardizem 360 mg daily- having trouble swallowing it- changed to liquid -Not on anticoagulation due to Cancer  Right lung mass -metastatic lung cancer -He is supposed to follow up at [facility] to see if he needs a bronchial stent - dr Julien Nordmann to see him tomorrow  Constipation? -no BM after 2 enemas therefore likely does not have much stool- he has not been eating much and this may be why he has had no BMs - added Colace at bedtime as he is on narcotics   Code Status: Full code Family Communication: Wife bedside Disposition Plan: Home tomorrow if everything OK after PEG DVT prophylaxis: Lovenox Consultants: GI, oncology  Procedures:  Antibiotics: Anti-infectives    Start     Dose/Rate Route Frequency Ordered Stop   07/27/14 1415  ceFAZolin (ANCEF) IVPB 2 g/50 mL premix     2 g 100 mL/hr over 30 Minutes Intravenous  Once 07/27/14 1413        Objective: [employer]  07/24/14 2221  Weight: 73.4 kg (161 lb 13.1 oz)    Intake/Output Summary (Last 24 hours) at 07/27/14 1438 Last data filed at 07/27/14 0839  Gross per 24 hour  Intake    600 ml  Output      0 ml  Net    600 ml     Vitals Filed Vitals:   07/26/14 1434 07/26/14 2118 07/27/14 0418 07/27/14 1358  BP: 106/62 94/80 113/77 101/69  Pulse: 89 108 83 114  Temp: 97.6 F (36.4 C)  98 F (36.7 C) 98.3 F (36.8 C)  TempSrc: Oral  Oral Oral  Resp: '16 16  16 16  '$ Height:      Weight:      SpO2: 94% 95% 98% 96%    Exam:  General:  Pt is alert, not in acute distress  HEENT: No icterus, No thrush, oral mucosa moist  Cardiovascular: regular rate and rhythm, S1/S2 No murmur  Respiratory: clear to auscultation bilaterally   Abdomen: Soft, +Bowel sounds, non tender, non distended, no guarding  MSK: No LE edema, cyanosis or clubbing  Data Reviewed: Basic Metabolic Panel:  Recent Labs Lab 07/24/14 1420 07/24/14 1451 07/25/14 0600 07/27/14 0520  NA 135  --  136 134*  K 4.0  --  4.0 3.8  CL 100*  --  103 100*  CO2 26  --  27 25  GLUCOSE 115*  --  106* 99  BUN 17  --  17 10  CREATININE 0.81  --  0.67 0.62  CALCIUM 9.1  --  8.2* 8.8*  MG  --  2.1  --   --    Liver Function Tests:  Recent Labs Lab 07/25/14 0600  AST 13*  ALT 19  ALKPHOS 53  BILITOT 0.5  PROT 6.0*  ALBUMIN 2.5*   No results for input(s): LIPASE, AMYLASE in the last 168 hours. No results for input(s): AMMONIA in the last 168 hours. CBC:  Recent Labs Lab 07/24/14 1420 07/25/14 0600 07/27/14 0520  WBC 15.7* 12.7* 12.3*  HGB 11.9* 10.0* 10.6*  HCT 37.7* 32.3* 33.9*  MCV 94.3 94.4 94.4  PLT 467* 414* 463*   Cardiac Enzymes: No results for input(s): CKTOTAL, CKMB, CKMBINDEX, TROPONINI in the last 168 hours. BNP (last 3 results)  Recent Labs  07/14/14 1916  BNP 333.5*    ProBNP (last 3 results) No results for input(s): PROBNP in the last 8760 hours.  CBG:  Recent Labs Lab 07/26/14 1129 07/26/14 1635 07/26/14 2120 07/27/14 0730 07/27/14 1208  GLUCAP 106* 101* 95 103* 92    Recent Results (from the past 240 hour(s))  Blood culture (routine x 2)     Status: None (Preliminary result)   Collection Time: 07/24/14  3:35 PM  Result Value Ref Range Status   Specimen Description BLOOD RIGHT ANTECUBITAL  Final   Special Requests BOTTLES DRAWN AEROBIC AND ANAEROBIC 5CC  Final   Culture   Final    NO GROWTH 2 DAYS Performed at [facility]    Report Status PENDING  Incomplete  Blood culture (routine x 2)     Status: None (Preliminary result)   Collection Time: 07/24/14  3:38 PM  Result Value Ref Range Status   Specimen Description BLOOD LEFT HAND  Final   Special Requests BOTTLES DRAWN AEROBIC AND ANAEROBIC [facility] EACH  Final   Culture   Final    NO GROWTH 2 DAYS Performed at [facility]    Report Status PENDING  Incomplete     Studies: No results found.  Scheduled Meds:  Scheduled Meds: . aspirin  300 mg Rectal Daily  .  ceFAZolin (ANCEF) IV  2 g Intravenous Once  . diclofenac sodium  4 g Topical QID  . [START ON 07/28/2014] diltiazem  90 mg Per Tube 4 times per day  . enoxaparin (LOVENOX) injection  40 mg Subcutaneous QHS  . flecainide  100 mg Per Tube BID  . lactose free nutrition  237 mL Oral BID BM  . OxyCODONE  15 mg Oral QHS  . pantoprazole (PROTONIX) IV  40 mg Intravenous Q12H  . senna-docusate  2 tablet Per Tube BID  . sodium chloride  10-40 mL Intracatheter Q12H   Continuous Infusions:   Time spent on care of this patient: 35 min   [place], MD 07/27/2014, 2:38 PM  LOS: 3 days   [facility] Office  [phone] Pager - Text Page per [email or web address] If 7PM-7AM, please contact night-coverage [email or web address]

## 2014-07-27 NOTE — Clinical Documentation Improvement (Signed)
Supporting Information: Protein calorie malnutrition/FTT secondary to severe esophageal stricture per 7/18 progress notes.   Per 07/26/14  Registered Dietician's evaluation, patient meets criteria for Severe malnutrition in context of chronic illness. Nutrition-Focused physical exam completed. Findings are severe fat depletion, severe muscle depletion, and no edema.   Treatment: Bolus feedings: Initiate 1/2 can of Osmolite 1.5 QID  30 ml Prostat BID.  Provides 710 kcal and 60g of protein    Possible Clinical Condtion: . Severity: --Mild Malnutrition (first degree) --Moderate Malnutrition (second degree) --Severe Malnutrition (third degree) . Avoid documenting a range of severity, such as "moderate to severe" --Other . Document any associated diagnoses/conditions   Thank Sherian Maroon Documentation Specialist [phone] [email or web address]

## 2014-07-27 NOTE — Progress Notes (Signed)
Patient Name: Steve [name] Date of Encounter: 07/27/2014  Primary Cardiologist: Dr. Harrington Challenger   Principal Problem:   Odynophagia Active Problems:   Reactive airway disease   Lung mass   History of bladder cancer   DM type 2 with diabetic peripheral neuropathy   Lung cancer, lower lobe   Atrial fibrillation with rapid ventricular response   Esophageal stricture    SUBJECTIVE  Less chest pain with swallowing today. Complain the size of medication this morning, says the 2 tablets of '180mg'$  diltiazem still too big to swallow  CURRENT MEDS . aspirin  300 mg Rectal Daily  . diclofenac sodium  4 g Topical QID  . diltiazem  360 mg Oral Daily  . docusate sodium  200 mg Oral QHS  . enoxaparin (LOVENOX) injection  40 mg Subcutaneous QHS  . flecainide  100 mg Oral BID  . lactose free nutrition  237 mL Oral BID BM  . OxyCODONE  15 mg Oral QHS  . pantoprazole (PROTONIX) IV  40 mg Intravenous Q12H  . sodium chloride  10-40 mL Intracatheter Q12H    OBJECTIVE  Filed Vitals:   07/26/14 1109 07/26/14 1434 07/26/14 2118 07/27/14 0418  BP: 130/84 106/62 94/80 113/77  Pulse: 115 89 108 83  Temp:  97.6 F (36.4 C)  98 F (36.7 C)  TempSrc:  Oral  Oral  Resp:  '16 16 16  '$ Height:      Weight:      SpO2: 93% 94% 95% 98%    Intake/Output Summary (Last 24 hours) at 07/27/14 0846 Last data filed at 07/27/14 0839  Gross per 24 hour  Intake    600 ml  Output      0 ml  Net    600 ml   Filed Weights   07/24/14 2221  Weight: 161 lb 13.1 oz (73.4 kg)    PHYSICAL EXAM  General: Pleasant, NAD. Neuro: Alert and oriented X 3. Moves all extremities spontaneously. Psych: Normal affect. HEENT:  Normal  Neck: Supple without bruits or JVD. Lungs:  Resp regular and unlabored, CTA. Heart: irregular. no s3, s4, or murmurs. Abdomen: Soft, non-tender, non-distended, BS + x 4.  Extremities: No clubbing, cyanosis or edema. DP/PT/Radials 2+ and equal bilaterally.  Accessory Clinical  Findings  CBC  Recent Labs  07/25/14 0600 07/27/14 0520  WBC 12.7* 12.3*  HGB 10.0* 10.6*  HCT 32.3* 33.9*  MCV 94.4 94.4  PLT 414* 150*   Basic Metabolic Panel  Recent Labs  07/24/14 1451 07/25/14 0600 07/27/14 0520  NA  --  136 134*  K  --  4.0 3.8  CL  --  103 100*  CO2  --  27 25  GLUCOSE  --  106* 99  BUN  --  17 10  CREATININE  --  0.67 0.62  CALCIUM  --  8.2* 8.8*  MG 2.1  --   --    Liver Function Tests  Recent Labs  07/25/14 0600  AST 13*  ALT 19  ALKPHOS 53  BILITOT 0.5  PROT 6.0*  ALBUMIN 2.5*    TELE A-fib with HR 100-110s    ECG  No new EKG  Echocardiogram  LV EF: 55% -  60%  ------------------------------------------------------------------- Indications:   Atrial fibrillation - 427.31.  ------------------------------------------------------------------- History:  PMH: Lung Cancer. Chest pain. Dyspnea. Chronic obstructive pulmonary disease.  ------------------------------------------------------------------- Study Conclusions  - Left ventricle: The cavity size was normal. There was mild concentric hypertrophy. Systolic function was normal.  The estimated ejection fraction was in the range of 55% to 60%. Wall motion was normal; there were no regional wall motion abnormalities. - Mitral valve: Calcified annulus.    Radiology/Studies  Dg Chest 2 View  07/24/2014   CLINICAL DATA:  Shortness of breath and difficulty swallowing.  EXAM: CHEST  2 VIEW  COMPARISON:  07/14/2014  FINDINGS: Right chest wall port a catheter is noted with tip in the cavoatrial junction. The heart size appears normal. Right pleural effusion is again identified. No left effusion. Right lower lobe lung mass is stable from previous exam.  IMPRESSION: 1. No acute findings and no significant change from 07/14/2014   Electronically Signed   By: Kerby Moors M.D.   On: 07/24/2014 16:01   Ct Angio Chest Pe W/cm &/or Wo Cm  07/14/2014   CLINICAL  DATA:  74 year old male with history of lung cancer and COPD complaining of shortness of breath.  EXAM: CT ANGIOGRAPHY CHEST WITH CONTRAST  TECHNIQUE: Multidetector CT imaging of the chest was performed using the standard protocol during bolus administration of intravenous contrast. Multiplanar CT image reconstructions and MIPs were obtained to evaluate the vascular anatomy.  CONTRAST:  136m OMNIPAQUE IOHEXOL 350 MG/ML SOLN  COMPARISON:  Chest radiograph dated 07/14/2014 and CT dated 05/14/2014.  FINDINGS: Mild emphysematous changes. There is stable appearing 7.0 x 4.5 cm right lower lobe mass with encasement of the right middle and right lower lobe bronchi and occlusion of the right lower lobe bronchus. There is associated postobstructive atelectasis versus pneumonia. Stable moderate right pleural effusion again noted. There is apparent extension of the mass into the right hilar and subcarinal region.  Mild atherosclerotic calcification of the aorta. No CT evidence of pulmonary embolism. There is encasement and complete occlusion of the right lower lobe pulmonary artery branch by the lung mass. No cardiomegaly. There is coronary vascular calcification. Small pericardial effusion.  Right pectoral Port-A-Cath with tip in central SVC. Partially visualized subcentimeter left hepatic hypodense lesion, incompletely characterized. Degenerative changes of the spine. No acute fracture.  Review of the MIP images confirms the above findings.  IMPRESSION: No CT evidence of pulmonary embolism.  Stable appearing right lower lobe mass with encasement of the right lower lobe pulmonary arteries branch as well as right lower lobe bronchus. There is postobstructive atelectasis/ pneumonia.  Grossly stable right pleural effusion.  Scratch   Electronically Signed   By: AAnner CreteM.D.   On: 07/14/2014 22:45   Dg Esophagus  06/29/2014   CLINICAL DATA:  Dysphagia and odynophagia. Choking and coughing. Solid food and liquids  sticking in throat and chest. Previous radiation therapy and chemotherapy for lung carcinoma.  EXAM: ESOPHOGRAM / BARIUM SWALLOW / BARIUM TABLET STUDY  TECHNIQUE: Combined double contrast and single contrast examination performed using effervescent crystals, thick barium liquid, and thin barium liquid. The patient was observed with fluoroscopy swallowing a 13 mm barium sulphate tablet.  FLUOROSCOPY TIME:  Fluoroscopy Time:  2 minutes 1 second  Number of Acquired Images:  11  COMPARISON:  Recent chest CT on 05/14/2014  FINDINGS: No evidence of vestibular penetration or aspiration during swallowing. The pharynx has a normal appearance.  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 This corresponds with subcarinal mediastinal lymphadenopathy seen on recent chest CT.  No definite findings of esophagitis noted. Esophageal motility is within normal limits. No evidence of hiatal hernia or gastroesophgeal reflux. An ingested 141mbarium tablet passed freely through the esophagus, and into  the stomach.  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 This stricture does not prevent passage of liquid barium or 13 mm barium tablet.   Electronically Signed   By: Earle Gell M.D.   On: 06/29/2014 14:33   Dg Chest Port 1 View  07/14/2014   CLINICAL DATA:  Worsening shortness of breath and chest pain. Currently on chemotherapy for right lower lobe lung carcinoma.  EXAM: PORTABLE CHEST - 1 VIEW  COMPARISON:  06/10/2014  FINDINGS: Right-sided power port remains in appropriate position. Right infrahilar mass like opacity shows no significant change allowing for differences in positioning. Pleural- parenchymal scarring at right lung base appears stable. No new areas of pulmonary opacity are identified. No evidence of pneumothorax or pleural effusion. Heart size is within normal limits.   IMPRESSION: No significant change in right infrahilar masslike opacity and right basilar scarring.   Electronically Signed   By: Earle Gell M.D.   On: 07/14/2014 19:33    ASSESSMENT AND PLAN  1. Persistent odynophagia/dysphagia and chest discomfort with recent esophageal stricture from extrinsic compression and esophagitis   2. Metastatic non-small cell lung cancer  3. Chronic respiratory failure  4. Paroxysmal atrial flutter/fibrillation (not on anticoag due to oncologic issues, on flecainide)  - Given reactive airway dx, off BB, switched to diltiazem '360mg'$  daily.  - went back to a-flutter around 3:20pm on 7/18, HR currently 100-110s, has been receiving 2 tablets of '180mg'$  diltiazem given swallowing issue. However ?how much is actually being absorbed in the stomach given esophageal stricture problem   - GI likely placed PEG tube today, should improve medication absorption afterward. Will continue current medication for now.   Addendum: discussed with pharmacist, the '360mg'$  CD diltiazem cannot be crushed during delivery, therefore given the need for PEG tube and facilitate drug delivery, will change to '90mg'$  short acting liquid diltiazem q6HR tomorrow which is much better absorbed  5. H/o subclinical hypothyroidism with normal TSH in 06/2014  SignedAlmyra Deforest PA-C Pager: [phone] Patient seen and examined and history reviewed. Agree with above findings and plan. I agree with above. Inability to take po pills regularly has limited our ability to control his afib/flutter. Once feeding tube in place we can switch cardizem to liquid formulation and also give flecainide regularly.  Kenzel Ruesch [place], [place] 07/27/2014 12:53 PM

## 2014-07-28 DIAGNOSIS — D72829 Elevated white blood cell count, unspecified: Secondary | ICD-10-CM

## 2014-07-28 DIAGNOSIS — D630 Anemia in neoplastic disease: Secondary | ICD-10-CM

## 2014-07-28 LAB — GLUCOSE, CAPILLARY
GLUCOSE-CAPILLARY: 136 mg/dL — AB (ref 65–99)
Glucose-Capillary: 130 mg/dL — ABNORMAL HIGH (ref 65–99)
Glucose-Capillary: 165 mg/dL — ABNORMAL HIGH (ref 65–99)
Glucose-Capillary: 132 mg/dL — ABNORMAL HIGH (ref 65–99)

## 2014-07-28 MED ORDER — JEVITY 1.2 CAL PO LIQD: 1000.0000 mL | ORAL | Status: DC

## 2014-07-28 MED ORDER — FREE WATER
120.0000 mL | Freq: Four times a day (QID) | Status: DC
  Administered 2014-07-28 – 2014-07-29 (×5): 120 mL

## 2014-07-28 MED ORDER — PRO-STAT SUGAR FREE PO LIQD
30.0000 mL | Freq: Two times a day (BID) | ORAL | Status: DC
  Administered 2014-07-28 – 2014-07-29 (×2): 30 mL
  Filled 2014-07-28 (×6): qty 30

## 2014-07-28 MED ORDER — OSMOLITE 1.5 CAL PO LIQD
237.0000 mL | Freq: Four times a day (QID) | ORAL | Status: DC
  Administered 2014-07-28 (×2): 237 mL
  Administered 2014-07-29: 120 mL
  Administered 2014-07-29: 237 mL
  Filled 2014-07-28 (×7): qty 237

## 2014-07-28 MED ORDER — DILTIAZEM 12 MG/ML ORAL SUSPENSION
90.0000 mg | Freq: Four times a day (QID) | ORAL | Status: DC
  Administered 2014-07-28 – 2014-07-29 (×5): 90 mg
  Filled 2014-07-28 (×8): qty 9

## 2014-07-28 MED ORDER — DILTIAZEM 12 MG/ML ORAL SUSPENSION
90.0000 mg | Freq: Four times a day (QID) | ORAL | Status: AC
  Administered 2014-07-28 (×2): 90 mg via ORAL
  Filled 2014-07-28 (×2): qty 9

## 2014-07-28 NOTE — Progress Notes (Addendum)
Nutrition Follow-up  DOCUMENTATION CODES:   Severe malnutrition in context of chronic illness  INTERVENTION:  Bolus feedings: Initiate 1/2 can of Osmolite 1.5 QID  30 ml Prostat BID.  Provides 710 kcal and 60g of protein  Recommend monitor magnesium, potassium, and phosphorus daily for at least 3 days, MD to replete as needed, as pt is at risk for refeeding syndrome given severe malnutrition status and poor PO intake PTA.  Goal: Advance as tolerated to 6 cans of Osmolite 1.5 daily. Tube feeding regimen provides 2330 kcal (100% of needs), 119 grams of protein, and 1086 ml of H2O.  Recommend free water flushes of 60 ml before and after each bolus. Patient will require additional water intake of 240 ml BID.  - Continue Boost Plus BID, each supplement provides 360 kcal and 14 grams of protein - RD will continue to monitor for needs  NUTRITION DIAGNOSIS:   Malnutrition related to chronic illness as evidenced by severe depletion of body fat, severe depletion of muscle mass, percent weight loss. -ongoing  GOAL:   Patient will meet greater than or equal to 90% of their needs -unmet  MONITOR:   PO intake, Labs, Weight trends, TF tolerance, Skin, I & O's  ASSESSMENT:   74 y.o. male with a history of atrial fibrillation, lung cancer, diabetes mellitus, esophagitis and esophageal ulcer found on EGD on 6/23.  7/20 - PEG placed at 1713 yesterday by IR - TF to be initiated at 1700 today; TF outlined above. RD to initiate and manage TF order was placed 7/18 - Pt continues with FLD and Boost Plus but has been unable to meet needs PO - Per rounds this AM, pt with extreme difficulty in swallowing - Medications reviewed. Labs reviewed; CBGs: 92-160 mg/dL, Na: 134 mmol/L, Cl: 100 mmol/L, Ca: 8.8 mg/dL  ADDENDUM: Pt states he ate 1 cup strawberry yogurt and 1 cup vanilla ice cream earlier today. He also sipped on Boost but states taste alteration is getting worse. Pt had consumed ~1/3  vanilla pudding at time of visit.   7/18 - Pt reports inability to eat or drink anything since 7/16 - Esophageal stricture present - Requested Boost Plus and was able to tolerate sips at most - Pt has lost 16 lb since 6/02 (9% weight loss x 1.5 months, significant for time frame)  Diet Order:  Diet full liquid Room service appropriate?: Yes; Fluid consistency:: Thin  Skin:  Reviewed, no issues  Last BM:  7/16  Height:   Ht Readings from Last 1 Encounters:  07/24/14 '5\' 11"'$  (1.803 m)    Weight:   Wt Readings from Last 1 Encounters:  07/24/14 161 lb 13.1 oz (73.4 kg)    Ideal Body Weight:  78.2 kg  Wt Readings from Last 10 Encounters:  07/24/14 161 lb 13.1 oz (73.4 kg)  07/16/14 173 lb 1 oz (78.5 kg)  07/06/14 171 lb 4.8 oz (77.701 kg)  07/01/14 174 lb (78.926 kg)  06/28/14 174 lb 4 oz (79.039 kg)  06/22/14 176 lb 11.2 oz (80.151 kg)  06/14/14 177 lb (80.287 kg)  06/10/14 178 lb (80.74 kg)  06/01/14 179 lb (81.194 kg)  05/11/14 177 lb 12.8 oz (80.65 kg)    BMI:  Body mass index is 22.58 kg/(m^2).  Estimated Nutritional Needs:   Kcal:  2200-2400  Protein:  110-120g  Fluid:  2.2L/day  EDUCATION NEEDS:   Education needs addressed    Jarome Matin, RD, LDN Inpatient Clinical Dietitian Pager # [phone] After hours/weekend  pager # [phone]

## 2014-07-28 NOTE — Progress Notes (Signed)
Patient Name: Steve [name] Date of Encounter: 07/28/2014  Principal Problem:   Odynophagia Active Problems:   Reactive airway disease   Lung mass   History of bladder cancer   DM type 2 with diabetic peripheral neuropathy   Lung cancer, lower lobe   Atrial fibrillation with rapid ventricular response   Esophageal stricture    SUBJECTIVE  PEG tube placed yesterday, however can not use until 5pm today. Denies chest pain, sob or palpitation. He is able to take sips of water.   CURRENT MEDS . aspirin  300 mg Rectal Daily  . diclofenac sodium  4 g Topical QID  . diltiazem  90 mg Per Tube 4 times per day  . enoxaparin (LOVENOX) injection  40 mg Subcutaneous QHS  . flecainide  100 mg Per Tube BID  . lactose free nutrition  237 mL Oral BID BM  . neomycin-bacitracin-polymyxin  1 application Topical Daily  . OxyCODONE  15 mg Oral QHS  . pantoprazole (PROTONIX) IV  40 mg Intravenous Q24H  . senna-docusate  2 tablet Per Tube BID  . sodium chloride  10-40 mL Intracatheter Q12H    OBJECTIVE  Filed Vitals:   07/27/14 1731 07/27/14 2119 07/28/14 0234 07/28/14 0632  BP: 101/63 138/76 109/87 96/69  Pulse: 104 129 129 103  Temp: 98.2 F (36.8 C) 97.8 F (36.6 C) 98.4 F (36.9 C) 97.6 F (36.4 C)  TempSrc: Oral Oral Axillary Oral  Resp: '20 20 20 20  '$ Height:      Weight:      SpO2: 93% 94% 96% 97%    Intake/Output Summary (Last 24 hours) at 07/28/14 0750 Last data filed at 07/28/14 0634  Gross per 24 hour  Intake      0 ml  Output    200 ml  Net   -200 ml   Filed Weights   07/24/14 2221  Weight: 161 lb 13.1 oz (73.4 kg)    PHYSICAL EXAM  General: Pleasant, NAD. Neuro: Alert and oriented X 3. Moves all extremities spontaneously. Psych: Normal affect. HEENT:  Normal  Neck: Supple without bruits or JVD. Lungs:  Resp regular and unlabored, CTA. Heart: irregular no s3, s4, or murmurs. Abdomen: Soft, PEG tube placed, non-distended, BS + x 4.  Extremities: No  clubbing, cyanosis or edema. DP/PT/Radials 2+ and equal bilaterally.  Accessory Clinical Findings  CBC  Recent Labs  07/27/14 0520  WBC 12.3*  HGB 10.6*  HCT 33.9*  MCV 94.4  PLT 761*   Basic Metabolic Panel  Recent Labs  07/27/14 0520  NA 134*  K 3.8  CL 100*  CO2 25  GLUCOSE 99  BUN 10  CREATININE 0.62  CALCIUM 8.8*    TELE  Atrial flutter/fibrillation at rate of 90-100s. Was in 130s yesterday evening.   Radiology/Studies  Dg Chest 2 View  07/24/2014   CLINICAL DATA:  Shortness of breath and difficulty swallowing.  EXAM: CHEST  2 VIEW  COMPARISON:  07/14/2014  FINDINGS: Right chest wall port a catheter is noted with tip in the cavoatrial junction. The heart size appears normal. Right pleural effusion is again identified. No left effusion. Right lower lobe lung mass is stable from previous exam.  IMPRESSION: 1. No acute findings and no significant change from 07/14/2014   Electronically Signed   By: Kerby Moors M.D.   On: 07/24/2014 16:01   Ct Angio Chest Pe W/cm &/or Wo Cm  07/14/2014   CLINICAL DATA:  74 year old male with history  of lung cancer and COPD complaining of shortness of breath.  EXAM: CT ANGIOGRAPHY CHEST WITH CONTRAST  TECHNIQUE: Multidetector CT imaging of the chest was performed using the standard protocol during bolus administration of intravenous contrast. Multiplanar CT image reconstructions and MIPs were obtained to evaluate the vascular anatomy.  CONTRAST:  172m OMNIPAQUE IOHEXOL 350 MG/ML SOLN  COMPARISON:  Chest radiograph dated 07/14/2014 and CT dated 05/14/2014.  FINDINGS: Mild emphysematous changes. There is stable appearing 7.0 x 4.5 cm right lower lobe mass with encasement of the right middle and right lower lobe bronchi and occlusion of the right lower lobe bronchus. There is associated postobstructive atelectasis versus pneumonia. Stable moderate right pleural effusion again noted. There is apparent extension of the mass into the right hilar  and subcarinal region.  Mild atherosclerotic calcification of the aorta. No CT evidence of pulmonary embolism. There is encasement and complete occlusion of the right lower lobe pulmonary artery branch by the lung mass. No cardiomegaly. There is coronary vascular calcification. Small pericardial effusion.  Right pectoral Port-A-Cath with tip in central SVC. Partially visualized subcentimeter left hepatic hypodense lesion, incompletely characterized. Degenerative changes of the spine. No acute fracture.  Review of the MIP images confirms the above findings.  IMPRESSION: No CT evidence of pulmonary embolism.  Stable appearing right lower lobe mass with encasement of the right lower lobe pulmonary arteries branch as well as right lower lobe bronchus. There is postobstructive atelectasis/ pneumonia.  Grossly stable right pleural effusion.  Scratch   Electronically Signed   By: AAnner CreteM.D.   On: 07/14/2014 22:45   Ir Gastrostomy Tube Mod Sed  07/27/2014   CLINICAL DATA:  74year old male with a history of dysphagia/strictures. He has been referred for percutaneous gastrostomy placement.  EXAM: PERCUTANEOUS GASTROSTOMY  FLUOROSCOPY TIME:  2 minutes 30 seconds minutes  MEDICATIONS AND MEDICAL HISTORY: Versed 2.5 mg, Fentanyl 125 mcg.  2 g Ancef  ANESTHESIA/SEDATION: Moderate sedation time: 27 minutes  CONTRAST:  25 cc Omni 300 enteric  PROCEDURE: The procedure, risks, benefits, and alternatives were explained to the patient. Questions regarding the procedure were encouraged and answered. The patient understands and consents to the procedure.  The epigastrium was prepped with Betadine in a sterile fashion, and a sterile drape was applied covering the operative field. A sterile gown and sterile gloves were used for the procedure.  A 5-French orogastric tube is placed under fluoroscopic guidance. Scout imaging of the abdomen confirms barium within the transverse colon.  The stomach was distended with gas. Under  fluoroscopic guidance, an 18 gauge needle was utilized to puncture the anterior wall of the body of the stomach. An Amplatz wire was advanced through the needle passing a T fastener into the lumen of the stomach. The T fastener was secured for gastropexy. A second T fastener was deployed using an 18 gauge needle adjacent to the first deployment.  Serial dilation was performed over the Amplatz wire at the location of the first puncture, with placement of a 20 French peel-away sheath. An 18 gauge balloon retention gastrostomy tube was then placed through the peel-away sheath over the wire. Once we confirmed position, the wire and peel-away sheath were removed. Contrast infused through the tube confirmed position and AP and lateral position.  The patient tolerated the procedure well and remained hemodynamically stable throughout.  No complications were encountered and no significant blood loss was encountered.  COMPLICATIONS: None  FINDINGS: The image demonstrates placement of a 20-French balloon retention gastrostomy tube  into the body of the stomach.  IMPRESSION: Status post placement of 20 French balloon retention gastrostomy.  Signed,  Dulcy Fanny. Earleen Newport DO  [facility]  [facility]  PLAN: Decompression for 24 hours on low wall suction.  The tube may be used starting after 24 hours.   Electronically Signed   By: Corrie Mckusick D.O.   On: 07/27/2014 17:49   Dg Esophagus  06/29/2014   CLINICAL DATA:  Dysphagia and odynophagia. Choking and coughing. Solid food and liquids sticking in throat and chest. Previous radiation therapy and chemotherapy for lung carcinoma.  EXAM: ESOPHOGRAM / BARIUM SWALLOW / BARIUM TABLET STUDY  TECHNIQUE: Combined double contrast and single contrast examination performed using effervescent crystals, thick barium liquid, and thin barium liquid. The patient was observed with fluoroscopy swallowing a 13 mm barium sulphate tablet.  FLUOROSCOPY TIME:   Fluoroscopy Time:  2 minutes 1 second  Number of Acquired Images:  11  COMPARISON:  Recent chest CT on 05/14/2014  FINDINGS: No evidence of vestibular penetration or aspiration during swallowing. The pharynx has a normal appearance.  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 This corresponds with subcarinal mediastinal lymphadenopathy seen on recent chest CT.  No definite findings of esophagitis noted. Esophageal motility is within normal limits. No evidence of hiatal hernia or gastroesophgeal reflux. An ingested 32m barium tablet passed freely through the esophagus, and into the stomach.  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 This stricture does not prevent passage of liquid barium or 13 mm barium tablet.   Electronically Signed   By: JEarle GellM.D.   On: 06/29/2014 14:33   Dg Chest Port 1 View  07/14/2014   CLINICAL DATA:  Worsening shortness of breath and chest pain. Currently on chemotherapy for right lower lobe lung carcinoma.  EXAM: PORTABLE CHEST - 1 VIEW  COMPARISON:  06/10/2014  FINDINGS: Right-sided power port remains in appropriate position. Right infrahilar mass like opacity shows no significant change allowing for differences in positioning. Pleural- parenchymal scarring at right lung base appears stable. No new areas of pulmonary opacity are identified. No evidence of pneumothorax or pleural effusion. Heart size is within normal limits.  IMPRESSION: No significant change in right infrahilar masslike opacity and right basilar scarring.   Electronically Signed   By: JEarle GellM.D.   On: 07/14/2014 19:33    ASSESSMENT AND PLAN  1. Persistent odynophagia/dysphagia and chest discomfort with recent esophageal stricture from extrinsic compression and esophagitis - currently chest pain free  2. Metastatic non-small cell lung  cancer  3. Chronic respiratory failure  4. Paroxysmal atrial flutter/fibrillation (not on anticoag due to oncologic issues, on flecainide) - Given reactive airway dx, off BB, switched to diltiazem '360mg'$  daily, however swallowing issue. Now on '90mg'$  short acting liquid diltiazem q6HR however unable to use PEG tube until 5pm today. - went back to a-flutter around 3:20pm on 7/18, HR currently 90s-100s, coming down  5. H/o subclinical hypothyroidism - normal TSH in 06/2014  SJarrett SohoPA-C Pager [phone] Patient seen and examined and history reviewed. Agree with above findings and plan. Still in and out of AFib/flutter. Rate currently in 90s. No symptoms. Now with PEG tube in place- will be able to use later tonight. Continue liquid formulation of diltiazem and current dose of flecainide.   Peter JMartinique MArkoma7/20/2016 1:29 PM   '

## 2014-07-28 NOTE — Progress Notes (Signed)
Physical Therapy Treatment Patient Details Name: Steve [name] MRN: [id number] DOB: 26-Jul-1940 Today's Date: 07/28/2014    History of Present Illness 74 y.o. male with h/o lung cancer, s/p chemo and radiation,  peripheral neuropathy admitted with difficulty swallowing, a fib.    PT Comments    Assisted OOB to amb a limited distance in hallway with pt's new 4 WW/cruiser.  Assisted back to bed per request.  Session limited by increased ABD pain and SOB.   Follow Up Recommendations  No PT follow up     Equipment Recommendations       Recommendations for Other Services       Precautions / Restrictions Precautions Precautions: Fall Restrictions Weight Bearing Restrictions: No    Mobility  Bed Mobility Overal bed mobility: Needs Assistance Bed Mobility: Supine to Sit;Sit to Supine     Supine to sit: Min assist Sit to supine: Min assist   General bed mobility comments: pt required increased assist due to recent ABD proceedure wuth increased c/o "soreness"  Transfers Overall transfer level: Needs assistance Equipment used: 4-wheeled walker Transfers: Sit to/from Stand Sit to Stand: Supervision;Min guard         General transfer comment: verbal cues for hand placement, no physical assist needed.  Safety cueing to lock brakes.  Ambulation/Gait Ambulation/Gait assistance: Min guard;Min assist Ambulation Distance (Feet): 50 Feet (25 feet x 2 one sitting rest break) Assistive device: 4-wheeled walker   Gait velocity: decreased   General Gait Details: decreased amb tolerance due to recent ABD proceedure and increased c/o "soreness".  Still 3/4 DOE with RA 93% and HR 114.     Stairs            Wheelchair Mobility    Modified Rankin (Stroke Patients Only)       Balance                                    Cognition Arousal/Alertness: Awake/alert Behavior During Therapy: WFL for tasks assessed/performed Overall Cognitive Status: Within  Functional Limits for tasks assessed                      Exercises      General Comments        Pertinent Vitals/Pain Pain Assessment: No/denies pain    Home Living                      Prior Function            PT Goals (current goals can now be found in the care plan section) Progress towards PT goals: Progressing toward goals    Frequency  Min 3X/week    PT Plan      Co-evaluation             End of Session Equipment Utilized During Treatment: Gait belt Activity Tolerance: Patient limited by fatigue;Patient limited by pain Patient left: in bed;with call bell/phone within reach     Time: 1410-1428 PT Time Calculation (min) (ACUTE ONLY): 18 min  Charges:  $Gait Training: 8-22 mins                    G Codes:      Rica Koyanagi  PTA [facility]  Acute  Rehab Pager      [phone]

## 2014-07-28 NOTE — Progress Notes (Signed)
Patient ID: Steve [name], male   DOB: 25-Aug-1940, 74 y.o.   MRN: [id number]     Referring Physician(s): Dr. Olevia Perches  Chief Complaint:  Odynophagia secondary to esophageal stricture   Subjective:  Steve [name] is a 74 y.o. male with lung cancer who developed progressive odynophagia secondary significant esophageal stricture.   He is s/p percutaneous gastrostomy tube placement yesterday.  He is doing well.  He states the area at the tube is a little sore, he states he expected that, but otherwise he is OK.  Allergies: Morphine and related  Medications: Prior to Admission medications   Medication Sig Start Date End Date Taking? Authorizing Provider  amoxicillin-clavulanate (AUGMENTIN) 400-57 MG/5ML suspension Take 400 mg by mouth 2 (two) times daily.   Yes Historical Provider, MD  flecainide (TAMBOCOR) 100 MG tablet Take 1 tablet (100 mg total) by mouth 2 (two) times daily. 07/17/14  Yes Hosie Poisson, MD  HYDROcodone-acetaminophen (HYCET) 7.5-325 mg/15 ml solution Take 10 mLs by mouth every 3 (three) hours as needed for moderate pain. 07/17/14  Yes Hosie Poisson, MD  lactose free nutrition (BOOST PLUS) LIQD Take 237 mLs by mouth 4 (four) times daily. 07/17/14  Yes Hosie Poisson, MD  lidocaine (XYLOCAINE) 2 % solution Use as directed 20 mLs in the mouth or throat every 6 (six) hours as needed for mouth pain. 06/22/14  Yes Maryanna Shape, NP  metoprolol tartrate (LOPRESSOR) 25 MG tablet Take 1 tablet (25 mg total) by mouth 2 (two) times daily. 01/22/14  Yes Fay Records, MD  midodrine (PROAMATINE) 2.5 MG tablet Take 2.5 mg by mouth 2 (two) times daily with a meal.  04/20/14  Yes Historical Provider, MD  omeprazole (PRILOSEC) 40 MG capsule Take 40 mg by mouth every morning. 07/22/14  Yes Historical Provider, MD  OVER THE COUNTER MEDICATION Take 2 tablets by mouth at bedtime. "Dulcolax with stool softener."   Yes Historical Provider, MD  oxyCODONE-acetaminophen (PERCOCET/ROXICET) 5-325 MG per tablet Take  1 tablet by mouth every 6 (six) hours as needed. For pain 06/28/14  Yes Historical Provider, MD  [place]   Yes Historical Provider, MD  simvastatin (ZOCOR) 20 MG tablet Take one tablet by mouth once daily to lower cholesterol 10/21/13  Yes [name], MD  temazepam (RESTORIL) 30 MG capsule Take 1 capsule (30 mg total) by mouth at bedtime as needed for sleep. 07/02/14  Yes [name], MD  Ipratropium-Albuterol (COMBIVENT) 20-100 MCG/ACT AERS respimat Inhale 1 puff into the lungs every 6 (six) hours. Patient not taking: Reported on 07/24/2014 06/03/14   Carlton Adam, PA-C  ipratropium-albuterol (DUONEB) 0.5-2.5 (3) MG/3ML SOLN Take 3 mLs by nebulization every 6 (six) hours. Patient not taking: Reported on 07/24/2014 07/17/14   Hosie Poisson, MD  pantoprazole sodium (PROTONIX) 40 mg/20 mL PACK Place 40 mLs (80 mg total) into feeding tube daily. Patient not taking: Reported on 07/24/2014 07/17/14   Hosie Poisson, MD     Vital Signs: BP 96/69 mmHg  Pulse 103  Temp(Src) 97.6 F (36.4 C) (Oral)  Resp 20  Ht '5\' 11"'$  (1.803 m)  Wt 161 lb 13.1 oz (73.4 kg)  BMI 22.58 kg/m2  SpO2 97%  Physical Exam   Awake and alert Abdomen Soft, NTND G-tube in place.  Some particles seen in the tube, currently to suction. I flushed the tube easily with an irrigation set and cleared the particulate matter. It has not yet been used and he is scheduled to start  tube feeds this evening around 6pm  Imaging: Dg Chest 2 View  07/24/2014   CLINICAL DATA:  Shortness of breath and difficulty swallowing.  EXAM: CHEST  2 VIEW  COMPARISON:  07/14/2014  FINDINGS: Right chest wall port a catheter is noted with tip in the cavoatrial junction. The heart size appears normal. Right pleural effusion is again identified. No left effusion. Right lower lobe lung mass is stable from previous exam.  IMPRESSION: 1. No acute findings and no significant change from 07/14/2014   Electronically Signed   By:  Kerby Moors M.D.   On: 07/24/2014 16:01   Ir Gastrostomy Tube Mod Sed  07/27/2014   CLINICAL DATA:  74 year old male with a history of dysphagia/strictures. He has been referred for percutaneous gastrostomy placement.  EXAM: PERCUTANEOUS GASTROSTOMY  FLUOROSCOPY TIME:  2 minutes 30 seconds minutes  MEDICATIONS AND MEDICAL HISTORY: Versed 2.5 mg, Fentanyl 125 mcg.  2 g Ancef  ANESTHESIA/SEDATION: Moderate sedation time: 27 minutes  CONTRAST:  25 cc Omni 300 enteric  PROCEDURE: The procedure, risks, benefits, and alternatives were explained to the patient. Questions regarding the procedure were encouraged and answered. The patient understands and consents to the procedure.  The epigastrium was prepped with Betadine in a sterile fashion, and a sterile drape was applied covering the operative field. A sterile gown and sterile gloves were used for the procedure.  A 5-French orogastric tube is placed under fluoroscopic guidance. Scout imaging of the abdomen confirms barium within the transverse colon.  The stomach was distended with gas. Under fluoroscopic guidance, an 18 gauge needle was utilized to puncture the anterior wall of the body of the stomach. An Amplatz wire was advanced through the needle passing a T fastener into the lumen of the stomach. The T fastener was secured for gastropexy. A second T fastener was deployed using an 18 gauge needle adjacent to the first deployment.  Serial dilation was performed over the Amplatz wire at the location of the first puncture, with placement of a 20 French peel-away sheath. An 18 gauge balloon retention gastrostomy tube was then placed through the peel-away sheath over the wire. Once we confirmed position, the wire and peel-away sheath were removed. Contrast infused through the tube confirmed position and AP and lateral position.  The patient tolerated the procedure well and remained hemodynamically stable throughout.  No complications were encountered and no  significant blood loss was encountered.  COMPLICATIONS: None  FINDINGS: The image demonstrates placement of a 20-French balloon retention gastrostomy tube into the body of the stomach.  IMPRESSION: Status post placement of 20 French balloon retention gastrostomy.  Signed,  Dulcy Fanny. Earleen Newport DO  [facility]  [facility]  PLAN: Decompression for 24 hours on low wall suction.  The tube may be used starting after 24 hours.   Electronically Signed   By: Corrie Mckusick D.O.   On: 07/27/2014 17:49    Labs:  CBC:  Recent Labs  07/16/14 0435 07/24/14 1420 07/25/14 0600 07/27/14 0520  WBC 16.1* 15.7* 12.7* 12.3*  HGB 9.7* 11.9* 10.0* 10.6*  HCT 29.9* 37.7* 32.3* 33.9*  PLT 454* 467* 414* 463*    COAGS:  Recent Labs  11/16/13 0755 01/28/14 1725  INR 1.10 CANCELED  APTT 35 CANCELED    BMP:  Recent Labs  07/16/14 0435 07/24/14 1420 07/25/14 0600 07/27/14 0520  NA 135 135 136 134*  K 3.9 4.0 4.0 3.8  CL 104 100* 103 100*  CO2 25 26  27 25  GLUCOSE 148* 115* 106* 99  BUN '17 17 17 10  '$ CALCIUM 8.9 9.1 8.2* 8.8*  CREATININE 0.62 0.81 0.67 0.62  GFRNONAA >60 >60 >60 >60  GFRAA >60 >60 >60 >60    LIVER FUNCTION TESTS:  Recent Labs  07/06/14 1325 07/14/14 1915 07/15/14 0206 07/25/14 0600  BILITOT 0.50 0.6 0.5 0.5  AST '15 16 17 '$ 13*  ALT '13 19 19 19  '$ ALKPHOS 86 81 75 53  PROT 7.2 8.3* 7.0 6.0*  ALBUMIN 2.9* 3.5 3.0* 2.5*    Assessment and Plan:  S/P Perc G-tube 07/27/2014  Doing well  Start tube feeds this evening  Signed: Lavaeh Bau S Tijuan Dantes PA-C 07/28/2014, 9:52 AM   I spent a total of 15 Minutes in face to face in clinical consultation/evaluation, greater than 50% of which was counseling/coordinating care for perc g-tube.

## 2014-07-28 NOTE — Progress Notes (Signed)
[facility]  [phone]   Patient Care Team: [name], MD as PCP - General (Internal Medicine)  HOSPITAL CONSULT  NOTE  HPI:  Steve [name] is a 74 year old man wit a history of Metastatic non-small cell lung cancer on systemic chemotherapy with gemcitabine as described below, admitted on 7/16 with worsening dysphagia with solids and liquids following a recent hospitalization from 7/6-7/9 with same complaints. Denies fevers, chills, night sweats, vision changes. Denies any worsening respiratory complaints. Denies any cardiac pain but had tachycardia with A fib/RVR controlled with meds, returning to sinus rhythm. Denies lower extremity swelling. He reported odynophagia. Denies nausea or vomiting. He is constipated. Denies abdominal pain. Appetite is decreased, having lost 8 lbs in a month time. Denies any dysuria. Denies abnormal skin rashes, or neuropathy. Denies any bleeding issues such as epistaxis, hematemesis, hematuria or hematochezia. His main complaint is a 'throbbing, constant " left rib pain "below the heart", which he stated has been present since initiation of [name]. He wants to discuss these symptoms with Dr. Julien Nordmann. Of note, CT angio on 7/19 was negative for PE, showing stable disease. Recent EGD done by Dr. 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 Due to the severe symptoms, he had a PEG tube placed by IR on 7/19. He will be initiated on tube feedings late today. We have been kindly informed of the patient's admission.   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   Primary site: Lung (Right)  Staging method: AJCC 7th Edition  Clinical: Stage IIIA (T2b, N2, M0)  Summary: Stage  IIIA (T2b, N2, M0)   PRIOR THERAPY:  1) Concurrent chemoradiation with weekly carboplatin for an AUC of 2 and paclitaxel 45 mg/m2, status post 4 cycles. 2) Consolidation chemotherapy with carboplatin for AUC of 5 and paclitaxel 175 mg/M2 every 3 weeks with Neulasta support, first cycle on 03/23/2013. Status post 3 cycles. 3) Immunotherapy with Nivolumab 3 mg/KG every 2 weeks. First dose 09/21/2013. Status post 12 cycles.   CURRENT THERAPY: Systemic chemotherapy with gemcitabine 1000 MG/M2 on days 1 and 8 every 3 weeks. First dose 03/30/2014. Status post 4 cycles.  MEDICATIONS: Scheduled Meds: . aspirin  300 mg Rectal Daily  . diclofenac sodium  4 g Topical QID  . diltiazem  90 mg Per Tube 4 times per day  . diltiazem  90 mg Oral 4 times per day  . enoxaparin (LOVENOX) injection  40 mg Subcutaneous QHS  . flecainide  100 mg Per Tube BID  . lactose free nutrition  237 mL Oral BID BM  . neomycin-bacitracin-polymyxin  1 application Topical Daily  . OxyCODONE  15 mg Oral QHS  . pantoprazole (PROTONIX) IV  40 mg Intravenous Q24H  . senna-docusate  2 tablet Per Tube BID  . sodium chloride  10-40 mL Intracatheter Q12H   Continuous Infusions:  PRN Meds:.HYDROmorphone (DILAUDID) injection, ipratropium, levalbuterol, ondansetron **OR** ondansetron (ZOFRAN) IV, oxyCODONE, sodium chloride, temazepam    ALLERGIES:   Allergies  Allergen Reactions  . Morphine And Related Hives     PHYSICAL EXAMINATION:  Filed Vitals:   07/28/14 0632  BP: 96/69  Pulse: 103  Temp: 97.6 F (36.4 C)  Resp: 20   Filed Weights   07/24/14 2221  Weight: 161 lb  13.1 oz (73.4 kg)    GENERAL:alert, no distress and uncomfortable, "miserable " due to left rib pain and dysphagia SKIN: skin color, texture, turgor are normal, no rashes or significant lesions EYES: normal, conjunctiva are pink and non-injected, sclera clear OROPHARYNX:no exudate, no erythema and lips, buccal mucosa, and tongue normal  NECK:  supple, thyroid normal size, non-tender, without nodularity LYMPH:  no palpable lymphadenopathy in the cervical, axillary or inguinal LUNGS: bilateral expiratory rhonchi without wheezing. normal breathing effort HEART: regular rate & rhythm and no murmurs and no lower extremity edema ABDOMEN: soft, non-tender and normal bowel sounds. PEG tube in place. Musculoskeletal:no cyanosis of digits and no clubbing  PSYCH: alert & oriented x 3 with fluent speech NEURO: no focal motor/sensory deficits   LABORATORY/RADIOLOGY DATA:   Recent Labs Lab 07/24/14 1420 07/25/14 0600 07/27/14 0520  WBC 15.7* 12.7* 12.3*  HGB 11.9* 10.0* 10.6*  HCT 37.7* 32.3* 33.9*  PLT 467* 414* 463*  MCV 94.3 94.4 94.4  MCH 29.8 29.2 29.5  MCHC 31.6 31.0 31.3  RDW 16.3* 16.5* 16.2*    CMP    Recent Labs Lab 07/24/14 1420 07/24/14 1451 07/25/14 0600 07/27/14 0520  NA 135  --  136 134*  K 4.0  --  4.0 3.8  CL 100*  --  103 100*  CO2 26  --  27 25  GLUCOSE 115*  --  106* 99  BUN 17  --  17 10  CREATININE 0.81  --  0.67 0.62  CALCIUM 9.1  --  8.2* 8.8*  MG  --  2.1  --   --   AST  --   --  13*  --   ALT  --   --  19  --   ALKPHOS  --   --  53  --   BILITOT  --   --  0.5  --         Component Value Date/Time   BILITOT 0.5 07/25/2014 0600   BILITOT 0.50 07/06/2014 1325    Liver Function Tests:  Recent Labs Lab 07/25/14 0600  AST 13*  ALT 19  ALKPHOS 53  BILITOT 0.5  PROT 6.0*  ALBUMIN 2.5*   CBG:  Recent Labs Lab 07/26/14 1635 07/26/14 2120 07/27/14 0730 07/27/14 1208 07/27/14 2221  GLUCAP 101* 95 103* 92 160*   ogy Studies:  Dg Chest 2 View  07/24/2014   CLINICAL DATA:  Shortness of breath and difficulty swallowing.  EXAM: CHEST  2 VIEW  COMPARISON:  07/14/2014  FINDINGS: Right chest wall port a catheter is noted with tip in the cavoatrial junction. The heart size appears normal. Right pleural effusion is again identified. No left effusion. Right lower lobe lung mass is  stable from previous exam.  IMPRESSION: 1. No acute findings and no significant change from 07/14/2014   Electronically Signed   By: Kerby Moors M.D.   On: 07/24/2014 16:01   Ct Angio Chest Pe W/cm &/or Wo Cm  07/14/2014   CLINICAL DATA:  74 year old male with history of lung cancer and COPD complaining of shortness of breath.  EXAM: CT ANGIOGRAPHY CHEST WITH CONTRAST  TECHNIQUE: Multidetector CT imaging of the chest was performed using the standard protocol during bolus administration of intravenous contrast. Multiplanar CT image reconstructions and MIPs were obtained to evaluate the vascular anatomy.  CONTRAST:  140m OMNIPAQUE IOHEXOL 350 MG/ML SOLN  COMPARISON:  Chest radiograph dated 07/14/2014 and CT dated 05/14/2014.  FINDINGS: Mild emphysematous changes. There  is stable appearing 7.0 x 4.5 cm right lower lobe mass with encasement of the right middle and right lower lobe bronchi and occlusion of the right lower lobe bronchus. There is associated postobstructive atelectasis versus pneumonia. Stable moderate right pleural effusion again noted. There is apparent extension of the mass into the right hilar and subcarinal region.  Mild atherosclerotic calcification of the aorta. No CT evidence of pulmonary embolism. There is encasement and complete occlusion of the right lower lobe pulmonary artery branch by the lung mass. No cardiomegaly. There is coronary vascular calcification. Small pericardial effusion.  Right pectoral Port-A-Cath with tip in central SVC. Partially visualized subcentimeter left hepatic hypodense lesion, incompletely characterized. Degenerative changes of the spine. No acute fracture.  Review of the MIP images confirms the above findings.  IMPRESSION: No CT evidence of pulmonary embolism.  Stable appearing right lower lobe mass with encasement of the right lower lobe pulmonary arteries branch as well as right lower lobe bronchus. There is postobstructive atelectasis/ pneumonia.  Grossly  stable right pleural effusion.  Scratch   Electronically Signed   By: Anner Crete M.D.   On: 07/14/2014 22:45   Ir Gastrostomy Tube Mod Sed  07/27/2014   CLINICAL DATA:  74 year old male with a history of dysphagia/strictures. He has been referred for percutaneous gastrostomy placement.  EXAM: PERCUTANEOUS GASTROSTOMY  FLUOROSCOPY TIME:  2 minutes 30 seconds minutes  MEDICATIONS AND MEDICAL HISTORY: Versed 2.5 mg, Fentanyl 125 mcg.  2 g Ancef  ANESTHESIA/SEDATION: Moderate sedation time: 27 minutes  CONTRAST:  25 cc Omni 300 enteric  PROCEDURE: The procedure, risks, benefits, and alternatives were explained to the patient. Questions regarding the procedure were encouraged and answered. The patient understands and consents to the procedure.  The epigastrium was prepped with Betadine in a sterile fashion, and a sterile drape was applied covering the operative field. A sterile gown and sterile gloves were used for the procedure.  A 5-French orogastric tube is placed under fluoroscopic guidance. Scout imaging of the abdomen confirms barium within the transverse colon.  The stomach was distended with gas. Under fluoroscopic guidance, an 18 gauge needle was utilized to puncture the anterior wall of the body of the stomach. An Amplatz wire was advanced through the needle passing a T fastener into the lumen of the stomach. The T fastener was secured for gastropexy. A second T fastener was deployed using an 18 gauge needle adjacent to the first deployment.  Serial dilation was performed over the Amplatz wire at the location of the first puncture, with placement of a 20 French peel-away sheath. An 18 gauge balloon retention gastrostomy tube was then placed through the peel-away sheath over the wire. Once we confirmed position, the wire and peel-away sheath were removed. Contrast infused through the tube confirmed position and AP and lateral position.  The patient tolerated the procedure well and remained  hemodynamically stable throughout.  No complications were encountered and no significant blood loss was encountered.  COMPLICATIONS: None  FINDINGS: The image demonstrates placement of a 20-French balloon retention gastrostomy tube into the body of the stomach.  IMPRESSION: Status post placement of 20 French balloon retention gastrostomy.  Signed,  Dulcy Fanny. Earleen Newport DO  [facility]  [facility]  PLAN: Decompression for 24 hours on low wall suction.  The tube may be used starting after 24 hours.   Electronically Signed   By: Corrie Mckusick D.O.   On: 07/27/2014 17:49   Dg Esophagus  06/29/2014  CLINICAL DATA:  Dysphagia and odynophagia. Choking and coughing. Solid food and liquids sticking in throat and chest. Previous radiation therapy and chemotherapy for lung carcinoma.  EXAM: ESOPHOGRAM / BARIUM SWALLOW / BARIUM TABLET STUDY  TECHNIQUE: Combined double contrast and single contrast examination performed using effervescent crystals, thick barium liquid, and thin barium liquid. The patient was observed with fluoroscopy swallowing a 13 mm barium sulphate tablet.  FLUOROSCOPY TIME:  Fluoroscopy Time:  2 minutes 1 second  Number of Acquired Images:  11  COMPARISON:  Recent chest CT on 05/14/2014  FINDINGS: No evidence of vestibular penetration or aspiration during swallowing. The pharynx has a normal appearance.  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 This corresponds with subcarinal mediastinal lymphadenopathy seen on recent chest CT.  No definite findings of esophagitis noted. Esophageal motility is within normal limits. No evidence of hiatal hernia or gastroesophgeal reflux. An ingested 43m barium tablet passed freely through the esophagus, and into the stomach.  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 This  stricture does not prevent passage of liquid barium or 13 mm barium tablet.   Electronically Signed   By: JEarle GellM.D.   On: 06/29/2014 14:33   Dg Chest Port 1 View  07/14/2014   CLINICAL DATA:  Worsening shortness of breath and chest pain. Currently on chemotherapy for right lower lobe lung carcinoma.  EXAM: PORTABLE CHEST - 1 VIEW  COMPARISON:  06/10/2014  FINDINGS: Right-sided power port remains in appropriate position. Right infrahilar mass like opacity shows no significant change allowing for differences in positioning. Pleural- parenchymal scarring at right lung base appears stable. No new areas of pulmonary opacity are identified. No evidence of pneumothorax or pleural effusion. Heart size is within normal limits.  IMPRESSION: No significant change in right infrahilar masslike opacity and right basilar scarring.   Electronically Signed   By: JEarle GellM.D.   On: 07/14/2014 19:33       ASSESSMENT AND PLAN:  Metastatic non-small cell lung cancer: Currently on systemic chemotherapy with single agent gemcitabine due to progression of the disease  He is status post 4 cycles.  Cycle 5 [facility] placed on hold [facility]  Latest CT angio is negative for PE, showing stable metastatic disease. No indication of bony disease  Plan on restaging CT scan of the abdomen and pelvis with contrast to reevaluate his disease. Further discussion with Dr. MJulien Nordmannlater today   Dysphagia Odynophagia Due to extrinsic compression and esophagitis Pain is controlled with current regimen He is s/p PEG tube placed by IR on 7/19. He will be initiated on tube feedings late today Appreciate GI  And Nutrition involvement  Anemia in neoplastic disease No transfusion is indicated at this time Monitor counts closely Transfuse blood to maintain a Hb of 8 g or if the patient is acutely bleeding   Leukocytosis This is due to recent steroids,  inflammation, pain No intervention is indicated at this time Will continue to monitor  DVT prophylaxis On Lovenox  Full Code   Other medical issues as per admitting team   WIntegris Grove HospitalE, PA-C 07/28/2014, 8:58 AM  ADDENDUM: Hematology/Oncology Attending: The patient is seen and examined. I agree with the above note. This is a very pleasant 74years old Steve [name] male with history of metastatic non-small cell lung cancer,  squamous cell carcinoma status post several chemotherapy regimens and most recently treated with single agent gemcitabine. This was discontinued secondary to severe retrosternal and epigastric pain. He was evaluated by gastroenterology and found to have gastric ulcer and currently receiving treatment for this condition. He had a PEG tube placed for nutrition yesterday. The recent CT scan of the chest performed on 07/14/2014 showed no significant evidence for disease progression. The patient continues to have significant shortness of breath as well as the epigastric pain. He is scheduled to see Dr. Kerin Ransom at [facility] next week for evaluation of his condition and to see if he would benefit from stent placement. I will arrange for the patient a follow-up appointment with me in 2 weeks for reevaluation and discussion of his treatment options after his evaluation at [facility]. Continue current pain medication and treatment for the esophagitis. Thank you so much for taking good care of Steve [name], I will continue to follow up the patient with you and assist in his management on as-needed basis.

## 2014-07-28 NOTE — Progress Notes (Signed)
[facility] Progress Note   KINGSON LOHMEYER ACZ:[id number] DOB: [birth year] DOA: 07/24/2014 PCP: [name], MD  Brief narrative: Steve [name] is a 74 y.o. male with a history of atrial fibrillation, lung cancer, diabetes mellitus, esophagitis and esophageal ulcer found on EGD on 6/23. The patient was admitted from 7/6 through 7/9 for chest pain. He was determined to have pneumonia and was treated for this. His main complaint which was chest pain did not improve. He also had odynophagia likely secondary to above-mentioned esophagitis/ulcer and he was recommended to take a liquid diet and was placed on Protonix. He was not able to obtain the proton X for a number of days and then eventually went back to daily Prilosec. He presented again to the hospital with his ongoing and odynophagia and resultant chest pain. It was so severe that he was having trouble swallowing.  Subjective: Patient has no new complaints. Some abdominal discomfort. Has not started to feed yet  Assessment/Plan: Principal Problem:   Odynophagia - Esophageal narrowing - Secondary to extrinsic from tumor mass  -Decided by GI that stenting would not be appropriate for this- continue treatment for lung CA to help shrink the tumor burden - full liquid diet - GI has decided to place PEG tube for nutrition - although he can swallow, PO intake is poor - Continue supportive therapy   Active Problems:  Left-sided chest pain -This is separate from above-mentioned pain and occurs when he has to take deep frequent breaths for example when he ambulates-suspect it is musculoskeletal-increased in pain medication as mentioned above-interestingly pain is not limiting his ambulation - per pt eval, he does not need HHPT -A CT of the chest last week on 7/6 did not reveal any underlying pathology for this pain  A. Fib - Continue current regimen. - Not on anticoagulation due to cancer  Right lung mass -metastatic lung cancer -  He is supposed to follow up at [facility] to see if he needs a bronchial stent - Oncology on board  Constipation - continue on Colace at bedtime as he is on narcotics   Code Status: Full code Family Communication: Wife bedside Disposition Plan: Home tomorrow if everything OK after PEG DVT prophylaxis: Lovenox Consultants: GI, oncology  Procedures:  Antibiotics: Anti-infectives    Start     Dose/Rate Route Frequency Ordered Stop   07/27/14 1700  ceFAZolin (ANCEF) IVPB 2 g/50 mL premix     2 g 100 mL/hr over 30 Minutes Intravenous 30 min pre-op 07/27/14 1413 07/27/14 1704   07/27/14 1537  ceFAZolin (ANCEF) 2-3 GM-% IVPB SOLR    Comments:  Margaretmary Dys   : cabinet override      07/27/14 1537 07/27/14 1636      Objective: Filed Weights   07/24/14 2221  Weight: 73.4 kg (161 lb 13.1 oz)    Intake/Output Summary (Last 24 hours) at 07/28/14 1739 Last data filed at 07/28/14 1223  Gross per 24 hour  Intake    120 ml  Output    400 ml  Net   -280 ml     Vitals Filed Vitals:   07/28/14 0234 07/28/14 0632 07/28/14 1018 07/28/14 1223  BP: 109/87 96/69 117/66 125/65  Pulse: 129 103 108 97  Temp: 98.4 F (36.9 C) 97.6 F (36.4 C)  97.9 F (36.6 C)  TempSrc: Axillary Oral  Oral  Resp: '20 20  19  '$ Height:      Weight:      SpO2: 96%  97%  96%    Exam:  General:  Pt is alert, not in acute distress  HEENT: No icterus, No thrush, oral mucosa moist  Cardiovascular: regular rate and rhythm, S1/S2 No murmur  Respiratory: clear to auscultation bilaterally , no wheezes  Abdomen: Soft, +Bowel sounds, non tender, non distended, no guarding  MSK: No LE edema, cyanosis or clubbing  Data Reviewed: Basic Metabolic Panel:  Recent Labs Lab 07/24/14 1420 07/24/14 1451 07/25/14 0600 07/27/14 0520  NA 135  --  136 134*  K 4.0  --  4.0 3.8  CL 100*  --  103 100*  CO2 26  --  27 25  GLUCOSE 115*  --  106* 99  BUN 17  --  17 10  CREATININE 0.81  --  0.67 0.62  CALCIUM 9.1  --   8.2* 8.8*  MG  --  2.1  --   --    Liver Function Tests:  Recent Labs Lab 07/25/14 0600  AST 13*  ALT 19  ALKPHOS 53  BILITOT 0.5  PROT 6.0*  ALBUMIN 2.5*   No results for input(s): LIPASE, AMYLASE in the last 168 hours. No results for input(s): AMMONIA in the last 168 hours. CBC:  Recent Labs Lab 07/24/14 1420 07/25/14 0600 07/27/14 0520  WBC 15.7* 12.7* 12.3*  HGB 11.9* 10.0* 10.6*  HCT 37.7* 32.3* 33.9*  MCV 94.3 94.4 94.4  PLT 467* 414* 463*   Cardiac Enzymes: No results for input(s): CKTOTAL, CKMB, CKMBINDEX, TROPONINI in the last 168 hours. BNP (last 3 results)  Recent Labs  07/14/14 1916  BNP 333.5*    ProBNP (last 3 results) No results for input(s): PROBNP in the last 8760 hours.  CBG:  Recent Labs Lab 07/27/14 0730 07/27/14 1208 07/27/14 2221 07/28/14 1156 07/28/14 1655  GLUCAP 103* 92 160* 136* 165*    Recent Results (from the past 240 hour(s))  Blood culture (routine x 2)     Status: None (Preliminary result)   Collection Time: 07/24/14  3:35 PM  Result Value Ref Range Status   Specimen Description BLOOD RIGHT ANTECUBITAL  Final   Special Requests BOTTLES DRAWN AEROBIC AND ANAEROBIC 5CC  Final   Culture   Final    NO GROWTH 4 DAYS Performed at [facility]    Report Status PENDING  Incomplete  Blood culture (routine x 2)     Status: None (Preliminary result)   Collection Time: 07/24/14  3:38 PM  Result Value Ref Range Status   Specimen Description BLOOD LEFT HAND  Final   Special Requests BOTTLES DRAWN AEROBIC AND ANAEROBIC 3CC EACH  Final   Culture   Final    NO GROWTH 4 DAYS Performed at [facility]    Report Status PENDING  Incomplete     Studies: Ir Gastrostomy Tube Mod Sed  07/27/2014   CLINICAL DATA:  74 year old male with a history of dysphagia/strictures. He has been referred for percutaneous gastrostomy placement.  EXAM: PERCUTANEOUS GASTROSTOMY  FLUOROSCOPY TIME:  2 minutes 30 seconds minutes   MEDICATIONS AND MEDICAL HISTORY: Versed 2.5 mg, Fentanyl 125 mcg.  2 g Ancef  ANESTHESIA/SEDATION: Moderate sedation time: 27 minutes  CONTRAST:  25 cc Omni 300 enteric  PROCEDURE: The procedure, risks, benefits, and alternatives were explained to the patient. Questions regarding the procedure were encouraged and answered. The patient understands and consents to the procedure.  The epigastrium was prepped with Betadine in a sterile fashion, and a sterile drape was applied covering  the operative field. A sterile gown and sterile gloves were used for the procedure.  A 5-French orogastric tube is placed under fluoroscopic guidance. Scout imaging of the abdomen confirms barium within the transverse colon.  The stomach was distended with gas. Under fluoroscopic guidance, an 18 gauge needle was utilized to puncture the anterior wall of the body of the stomach. An Amplatz wire was advanced through the needle passing a T fastener into the lumen of the stomach. The T fastener was secured for gastropexy. A second T fastener was deployed using an 18 gauge needle adjacent to the first deployment.  Serial dilation was performed over the Amplatz wire at the location of the first puncture, with placement of a 20 French peel-away sheath. An 18 gauge balloon retention gastrostomy tube was then placed through the peel-away sheath over the wire. Once we confirmed position, the wire and peel-away sheath were removed. Contrast infused through the tube confirmed position and AP and lateral position.  The patient tolerated the procedure well and remained hemodynamically stable throughout.  No complications were encountered and no significant blood loss was encountered.  COMPLICATIONS: None  FINDINGS: The image demonstrates placement of a 20-French balloon retention gastrostomy tube into the body of the stomach.  IMPRESSION: Status post placement of 20 French balloon retention gastrostomy.  Signed,  Dulcy Fanny. Earleen Newport DO  [facility]  [facility]  PLAN: Decompression for 24 hours on low wall suction.  The tube may be used starting after 24 hours.   Electronically Signed   By: Corrie Mckusick D.O.   On: 07/27/2014 17:49    Scheduled Meds:  Scheduled Meds: . aspirin  300 mg Rectal Daily  . diclofenac sodium  4 g Topical QID  . diltiazem  90 mg Per Tube 4 times per day  . enoxaparin (LOVENOX) injection  40 mg Subcutaneous QHS  . feeding supplement (OSMOLITE 1.5 CAL)  237 mL Per Tube QID  . feeding supplement (PRO-STAT SUGAR FREE 64)  30 mL Per Tube BID  . flecainide  100 mg Per Tube BID  . free water  120 mL Per Tube QID  . lactose free nutrition  237 mL Oral BID BM  . neomycin-bacitracin-polymyxin  1 application Topical Daily  . OxyCODONE  15 mg Oral QHS  . pantoprazole (PROTONIX) IV  40 mg Intravenous Q24H  . senna-docusate  2 tablet Per Tube BID  . sodium chloride  10-40 mL Intracatheter Q12H   Continuous Infusions:   Time spent on care of this patient: 35 min   Velvet Bathe, MD 07/28/2014, 5:39 PM  LOS: 4 days   [facility] Office  [phone] Pager - Text Page per [email or web address] If 7PM-7AM, please contact night-coverage [email or web address]

## 2014-07-29 LAB — CULTURE, BLOOD (ROUTINE X 2)
CULTURE: NO GROWTH
Culture: NO GROWTH

## 2014-07-29 LAB — GLUCOSE, CAPILLARY
Glucose-Capillary: 130 mg/dL — ABNORMAL HIGH (ref 65–99)
Glucose-Capillary: 143 mg/dL — ABNORMAL HIGH (ref 65–99)

## 2014-07-29 MED ORDER — OXYCODONE HCL ER 15 MG PO T12A: 15.0000 mg | EXTENDED_RELEASE_TABLET | Freq: Every day | ORAL | Status: DC

## 2014-07-29 MED ORDER — HEPARIN SOD (PORK) LOCK FLUSH 100 UNIT/ML IV SOLN
500.0000 [IU] | INTRAVENOUS | Status: DC
  Filled 2014-07-29: qty 5

## 2014-07-29 MED ORDER — PANTOPRAZOLE SODIUM 40 MG PO PACK: 40.0000 mg | PACK | Freq: Every day | ORAL | Status: DC

## 2014-07-29 MED ORDER — OSMOLITE 1.2 CAL PO LIQD
237.0000 mL | Freq: Four times a day (QID) | ORAL | Status: DC
  Filled 2014-07-29 (×2): qty 237

## 2014-07-29 MED ORDER — TEMAZEPAM 30 MG PO CAPS: 30.0000 mg | ORAL_CAPSULE | Freq: Every evening | ORAL | Status: AC | PRN

## 2014-07-29 MED ORDER — HEPARIN SOD (PORK) LOCK FLUSH 100 UNIT/ML IV SOLN
500.0000 [IU] | INTRAVENOUS | Status: DC | PRN
  Administered 2014-07-29: 500 [IU]
  Filled 2014-07-29: qty 5

## 2014-07-29 MED ORDER — DILTIAZEM 12 MG/ML ORAL SUSPENSION: 90.0000 mg | Freq: Four times a day (QID) | ORAL | Status: DC

## 2014-07-29 MED ORDER — PRO-STAT SUGAR FREE PO LIQD: 30.0000 mL | Freq: Two times a day (BID) | ORAL | Status: AC

## 2014-07-29 MED ORDER — OXYCODONE HCL 5 MG/5ML PO SOLN: 5.0000 mg | ORAL | Status: DC | PRN

## 2014-07-29 MED ORDER — ASPIRIN 81 MG PO TABS: 81.0000 mg | ORAL_TABLET | Freq: Every day | ORAL | Status: DC

## 2014-07-29 MED ORDER — FREE WATER: 120.0000 mL | Freq: Four times a day (QID) | Status: AC

## 2014-07-29 MED ORDER — SIMVASTATIN 20 MG PO TABS: 20.0000 mg | ORAL_TABLET | Freq: Every day | ORAL | Status: AC

## 2014-07-29 MED ORDER — OSMOLITE 1.5 CAL PO LIQD: 237.0000 mL | Freq: Four times a day (QID) | ORAL | Status: AC

## 2014-07-29 MED ORDER — BOOST PLUS PO LIQD: 237.0000 mL | Freq: Two times a day (BID) | ORAL | Status: DC

## 2014-07-29 MED ORDER — FLECAINIDE ACETATE 100 MG PO TABS: 100.0000 mg | ORAL_TABLET | Freq: Two times a day (BID) | ORAL | Status: AC

## 2014-07-29 NOTE — Progress Notes (Signed)
[facility] ED Cm consulted to assist with home health for pt  CM reviewed EPIC notes to find on 07/27/14 that Unit Cm completed referral for services for pt . Pt selected [facility] for [facility]. Referral given to in house rep with [facility].

## 2014-07-29 NOTE — Discharge Summary (Signed)
Physician Discharge Summary  Steve [name] YDX:[id number] DOB: 07/21/1940 DOA: 07/24/2014  PCP: [name], MD  Admit date: 07/24/2014 Discharge date: 07/29/2014  Time spent: > 35 minutes  Recommendations for Outpatient Follow-up:  1. Patient to follow-up with his primary care physician for further evaluation recommendations. 2. Should any questions arise regarding nutritional supplementation please feel free to discuss recommendations with dietitian here at [facility] 3. Please have patient follow up with gastroenterology for further evaluation recommendations  Discharge Diagnoses:  Principal Problem:   Odynophagia Active Problems:   Reactive airway disease   Lung mass   History of bladder cancer   DM type 2 with diabetic peripheral neuropathy   Lung cancer, lower lobe   Atrial fibrillation with rapid ventricular response   Esophageal stricture   Discharge Condition: Stable  Diet recommendation: Patient is on tube feeds Osmolite 1.5 QID  Filed Weights   07/24/14 2221 07/29/14 0455  Weight: 73.4 kg (161 lb 13.1 oz) 72.7 kg (160 lb 4.4 oz)    History of present illness:  Steve [name] is a 74 y.o. male with a history of atrial fibrillation, lung cancer, diabetes mellitus, esophagitis and esophageal ulcer found on EGD on 6/23. The patient was admitted from 7/6 through 7/9 for chest pain. He was determined to have pneumonia and was treated for this. His main complaint which was chest pain did not improve. He also had odynophagia likely secondary to above-mentioned esophagitis/ulcer and he was recommended to take a liquid diet and was placed on Protonix. He was not able to obtain the proton X for a number of days and then eventually went back to daily Prilosec. He presented again to the hospital with his ongoing and odynophagia and resultant chest pain. It was so severe that he was having trouble swallowing.  Hospital Course:  Principal Problem:  Odynophagia - Esophageal  narrowing - Secondary to extrinsic from tumor mass -Decided by GI that stenting would not be appropriate for this- continue treatment for lung CA to help shrink the tumor burden - Diet per dietitian of which patient recently had PEG tube placed and can continue nutritional supplementation as recommended by dietitian. - GI has decided to place PEG tube for nutrition - although he can swallow, PO intake is poor  Active Problems:  Left-sided chest pain - continue prn pain medication. -A CT of the chest last week on 7/6 did not reveal any underlying pathology for this pain  A. Fib - Cardiology on board and recommended the following:  4. Paroxysmal atrial flutter/fibrillation (not on anticoag due to oncologic issues, on flecainide) - converted to NSR this AM - Now on '90mg'$  short acting liquid diltiazem q6HR via PEG tube . - went back to a-flutter around 3:20pm on 7/18, converted back to NSR around 2:11 am on 7/21 after started on liquis short acting diltiazem suspension - continue ASA, fleicainide and liquid diltiazem.  - Not on anticoagulation due to cancer  Right lung mass -metastatic lung cancer - He is supposed to follow up at [facility] to see if he needs a bronchial stent   Procedures:  G-tube placement  Consultations: Jarome Matin, RD, LDN Inpatient Clinical Dietitian Cardiology  GI  Discharge Exam: Filed Vitals:   07/29/14 0455  BP: 125/61  Pulse: 107  Temp: 99 F (37.2 C)  Resp: 18    General: Pt in nad, alert and awake Cardiovascular: rrr, no rubs Respiratory: no increased wob, no wheezes, equal chest rise.  Discharge Instructions  Discharge Instructions    Call MD for:  difficulty breathing, headache or visual disturbances    Complete by:  As directed      Call MD for:  redness, tenderness, or signs of infection (pain, swelling, redness, odor or green/yellow discharge around incision site)    Complete by:  As directed       Call MD for:  temperature >100.4    Complete by:  As directed      Diet - low sodium heart healthy    Complete by:  As directed      Discharge instructions    Complete by:  As directed   Please be sure to follow up with your primary care physician in 1-2 weeks. Also nutritional feeds should be advanced as recommended by your nutritionist while in house. Call the nutritionist here at [facility] should any questions arise.     Increase activity slowly    Complete by:  As directed           Current Discharge Medication List    START taking these medications   Details  Amino Acids-Protein Hydrolys (FEEDING SUPPLEMENT, PRO-STAT SUGAR FREE 64,) LIQD Place 30 mLs into feeding tube 2 (two) times daily. Qty: 900 mL, Refills: 0    aspirin 81 MG tablet Place 1 tablet (81 mg total) into feeding tube daily. Qty: 30 tablet, Refills: 0    diltiazem (CARDIZEM) 10 mg/ml oral suspension Place 9 mLs (90 mg total) into feeding tube every 6 (six) hours. Qty: 9 mL, Refills: 0    OxyCODONE (OXYCONTIN) 15 mg T12A 12 hr tablet Take 1 tablet (15 mg total) by mouth at bedtime. Qty: 30 tablet, Refills: 0    oxyCODONE (ROXICODONE) 5 MG/5ML solution Take 5 mLs (5 mg total) by mouth every 4 (four) hours as needed for moderate pain. Qty: 473 mL, Refills: 0    Water For Irrigation, Sterile (FREE WATER) SOLN Place 120 mLs into feeding tube 4 (four) times daily.      CONTINUE these medications which have CHANGED   Details  flecainide (TAMBOCOR) 100 MG tablet Place 1 tablet (100 mg total) into feeding tube 2 (two) times daily. Qty: 30 tablet, Refills: 0    !! lactose free nutrition (BOOST PLUS) LIQD Take 237 mLs by mouth 2 (two) times daily between meals. Refills: 0    !! Nutritional Supplements (FEEDING SUPPLEMENT, OSMOLITE 1.5 CAL,) LIQD Place 237 mLs into feeding tube 4 (four) times daily. Qty: 90 Bottle, Refills: 0    simvastatin (ZOCOR) 20 MG tablet Place 1 tablet (20 mg total) into feeding tube daily. Take  one tablet by mouth once daily to lower cholesterol Qty: 90 tablet, Refills: 3    temazepam (RESTORIL) 30 MG capsule Place 1 capsule (30 mg total) into feeding tube at bedtime as needed for sleep. Qty: 30 capsule, Refills: 0     !! - Potential duplicate medications found. Please discuss with provider.    CONTINUE these medications which have NOT CHANGED   Details  lidocaine (XYLOCAINE) 2 % solution Use as directed 20 mLs in the mouth or throat every 6 (six) hours as needed for mouth pain. Qty: 480 mL, Refills: 1    pantoprazole sodium (PROTONIX) 40 mg/20 mL PACK Place 40 mLs (80 mg total) into feeding tube daily. Qty: 30 each, Refills: 1      STOP taking these medications     amoxicillin-clavulanate (AUGMENTIN) 400-57 MG/5ML suspension      HYDROcodone-acetaminophen (HYCET) 7.5-325 mg/15 ml  solution      metoprolol tartrate (LOPRESSOR) 25 MG tablet      midodrine (PROAMATINE) 2.5 MG tablet      omeprazole (PRILOSEC) 40 MG capsule      OVER THE COUNTER MEDICATION      oxyCODONE-acetaminophen (PERCOCET/ROXICET) 5-325 MG per tablet      PRESCRIPTION MEDICATION      Ipratropium-Albuterol (COMBIVENT) 20-100 MCG/ACT AERS respimat      ipratropium-albuterol (DUONEB) 0.5-2.5 (3) MG/3ML SOLN        Allergies  Allergen Reactions  . Morphine And Related Hives      The results of significant diagnostics from this hospitalization (including imaging, microbiology, ancillary and laboratory) are listed below for reference.    Significant Diagnostic Studies: Dg Chest 2 View  07/24/2014   CLINICAL DATA:  Shortness of breath and difficulty swallowing.  EXAM: CHEST  2 VIEW  COMPARISON:  07/14/2014  FINDINGS: Right chest wall port a catheter is noted with tip in the cavoatrial junction. The heart size appears normal. Right pleural effusion is again identified. No left effusion. Right lower lobe lung mass is stable from previous exam.  IMPRESSION: 1. No acute findings and no significant  change from 07/14/2014   Electronically Signed   By: Kerby Moors M.D.   On: 07/24/2014 16:01   Ct Angio Chest Pe W/cm &/or Wo Cm  07/14/2014   CLINICAL DATA:  74 year old male with history of lung cancer and COPD complaining of shortness of breath.  EXAM: CT ANGIOGRAPHY CHEST WITH CONTRAST  TECHNIQUE: Multidetector CT imaging of the chest was performed using the standard protocol during bolus administration of intravenous contrast. Multiplanar CT image reconstructions and MIPs were obtained to evaluate the vascular anatomy.  CONTRAST:  151m OMNIPAQUE IOHEXOL 350 MG/ML SOLN  COMPARISON:  Chest radiograph dated 07/14/2014 and CT dated 05/14/2014.  FINDINGS: Mild emphysematous changes. There is stable appearing 7.0 x 4.5 cm right lower lobe mass with encasement of the right middle and right lower lobe bronchi and occlusion of the right lower lobe bronchus. There is associated postobstructive atelectasis versus pneumonia. Stable moderate right pleural effusion again noted. There is apparent extension of the mass into the right hilar and subcarinal region.  Mild atherosclerotic calcification of the aorta. No CT evidence of pulmonary embolism. There is encasement and complete occlusion of the right lower lobe pulmonary artery branch by the lung mass. No cardiomegaly. There is coronary vascular calcification. Small pericardial effusion.  Right pectoral Port-A-Cath with tip in central SVC. Partially visualized subcentimeter left hepatic hypodense lesion, incompletely characterized. Degenerative changes of the spine. No acute fracture.  Review of the MIP images confirms the above findings.  IMPRESSION: No CT evidence of pulmonary embolism.  Stable appearing right lower lobe mass with encasement of the right lower lobe pulmonary arteries branch as well as right lower lobe bronchus. There is postobstructive atelectasis/ pneumonia.  Grossly stable right pleural effusion.  Scratch   Electronically Signed   By: AAnner CreteM.D.   On: 07/14/2014 22:45   Ir Gastrostomy Tube Mod Sed  07/27/2014   CLINICAL DATA:  74year old male with a history of dysphagia/strictures. He has been referred for percutaneous gastrostomy placement.  EXAM: PERCUTANEOUS GASTROSTOMY  FLUOROSCOPY TIME:  2 minutes 30 seconds minutes  MEDICATIONS AND MEDICAL HISTORY: Versed 2.5 mg, Fentanyl 125 mcg.  2 g Ancef  ANESTHESIA/SEDATION: Moderate sedation time: 27 minutes  CONTRAST:  25 cc Omni 300 enteric  PROCEDURE: The procedure, risks, benefits, and alternatives were explained  to the patient. Questions regarding the procedure were encouraged and answered. The patient understands and consents to the procedure.  The epigastrium was prepped with Betadine in a sterile fashion, and a sterile drape was applied covering the operative field. A sterile gown and sterile gloves were used for the procedure.  A 5-French orogastric tube is placed under fluoroscopic guidance. Scout imaging of the abdomen confirms barium within the transverse colon.  The stomach was distended with gas. Under fluoroscopic guidance, an 18 gauge needle was utilized to puncture the anterior wall of the body of the stomach. An Amplatz wire was advanced through the needle passing a T fastener into the lumen of the stomach. The T fastener was secured for gastropexy. A second T fastener was deployed using an 18 gauge needle adjacent to the first deployment.  Serial dilation was performed over the Amplatz wire at the location of the first puncture, with placement of a 20 French peel-away sheath. An 18 gauge balloon retention gastrostomy tube was then placed through the peel-away sheath over the wire. Once we confirmed position, the wire and peel-away sheath were removed. Contrast infused through the tube confirmed position and AP and lateral position.  The patient tolerated the procedure well and remained hemodynamically stable throughout.  No complications were encountered and no significant  blood loss was encountered.  COMPLICATIONS: None  FINDINGS: The image demonstrates placement of a 20-French balloon retention gastrostomy tube into the body of the stomach.  IMPRESSION: Status post placement of 20 French balloon retention gastrostomy.  Signed,  Dulcy Fanny. Earleen Newport DO  [facility]  [facility]  PLAN: Decompression for 24 hours on low wall suction.  The tube may be used starting after 24 hours.   Electronically Signed   By: Corrie Mckusick D.O.   On: 07/27/2014 17:49   Dg Chest Port 1 View  07/14/2014   CLINICAL DATA:  Worsening shortness of breath and chest pain. Currently on chemotherapy for right lower lobe lung carcinoma.  EXAM: PORTABLE CHEST - 1 VIEW  COMPARISON:  06/10/2014  FINDINGS: Right-sided power port remains in appropriate position. Right infrahilar mass like opacity shows no significant change allowing for differences in positioning. Pleural- parenchymal scarring at right lung base appears stable. No new areas of pulmonary opacity are identified. No evidence of pneumothorax or pleural effusion. Heart size is within normal limits.  IMPRESSION: No significant change in right infrahilar masslike opacity and right basilar scarring.   Electronically Signed   By: Earle Gell M.D.   On: 07/14/2014 19:33    Microbiology: Recent Results (from the past 240 hour(s))  Blood culture (routine x 2)     Status: None   Collection Time: 07/24/14  3:35 PM  Result Value Ref Range Status   Specimen Description BLOOD RIGHT ANTECUBITAL  Final   Special Requests BOTTLES DRAWN AEROBIC AND ANAEROBIC 5CC  Final   Culture   Final    NO GROWTH 5 DAYS Performed at [facility]    Report Status 07/29/2014 FINAL  Final  Blood culture (routine x 2)     Status: None   Collection Time: 07/24/14  3:38 PM  Result Value Ref Range Status   Specimen Description BLOOD LEFT HAND  Final   Special Requests BOTTLES DRAWN AEROBIC AND ANAEROBIC [facility] EACH  Final    Culture   Final    NO GROWTH 5 DAYS Performed at [facility]    Report Status 07/29/2014 FINAL  Final  Labs: Basic Metabolic Panel:  Recent Labs Lab 07/24/14 1420 07/24/14 1451 07/25/14 0600 07/27/14 0520  NA 135  --  136 134*  K 4.0  --  4.0 3.8  CL 100*  --  103 100*  CO2 26  --  27 25  GLUCOSE 115*  --  106* 99  BUN 17  --  17 10  CREATININE 0.81  --  0.67 0.62  CALCIUM 9.1  --  8.2* 8.8*  MG  --  2.1  --   --    Liver Function Tests:  Recent Labs Lab 07/25/14 0600  AST 13*  ALT 19  ALKPHOS 53  BILITOT 0.5  PROT 6.0*  ALBUMIN 2.5*   No results for input(s): LIPASE, AMYLASE in the last 168 hours. No results for input(s): AMMONIA in the last 168 hours. CBC:  Recent Labs Lab 07/24/14 1420 07/25/14 0600 07/27/14 0520  WBC 15.7* 12.7* 12.3*  HGB 11.9* 10.0* 10.6*  HCT 37.7* 32.3* 33.9*  MCV 94.3 94.4 94.4  PLT 467* 414* 463*   Cardiac Enzymes: No results for input(s): CKTOTAL, CKMB, CKMBINDEX, TROPONINI in the last 168 hours. BNP: BNP (last 3 results)  Recent Labs  07/14/14 1916  BNP 333.5*    ProBNP (last 3 results) No results for input(s): PROBNP in the last 8760 hours.  CBG:  Recent Labs Lab 07/28/14 1156 07/28/14 1655 07/28/14 2208 07/29/14 0807 07/29/14 1212  GLUCAP 136* 165* 132* 130* 143*       Signed:  Velvet Bathe  [facility] 07/29/2014, 5:29 PM

## 2014-07-29 NOTE — Progress Notes (Signed)
CM spoke with Steve [name] to have RN to speak with Dr Wendee Beavers about enteral feedings orders for pt to assist [facility]

## 2014-07-29 NOTE — Progress Notes (Signed)
Pt ready to take the full Osmolyte 1.5 2109m. Wife administer it & pt tolerated well.

## 2014-07-29 NOTE — Progress Notes (Signed)
Patient Name: Steve [name] Date of Encounter: 07/29/2014  Principal Problem:   Odynophagia Active Problems:   Reactive airway disease   Lung mass   History of bladder cancer   DM type 2 with diabetic peripheral neuropathy   Lung cancer, lower lobe   Atrial fibrillation with rapid ventricular response   Esophageal stricture    SUBJECTIVE  Slept ok last night. Went back to NSR around 2:11 am this morning, currently maintaining NSR. Denies any SOB.   CURRENT MEDS . aspirin  300 mg Rectal Daily  . diclofenac sodium  4 g Topical QID  . diltiazem  90 mg Per Tube 4 times per day  . enoxaparin (LOVENOX) injection  40 mg Subcutaneous QHS  . feeding supplement (OSMOLITE 1.5 CAL)  237 mL Per Tube QID  . feeding supplement (PRO-STAT SUGAR FREE 64)  30 mL Per Tube BID  . flecainide  100 mg Per Tube BID  . free water  120 mL Per Tube QID  . lactose free nutrition  237 mL Oral BID BM  . neomycin-bacitracin-polymyxin  1 application Topical Daily  . OxyCODONE  15 mg Oral QHS  . pantoprazole (PROTONIX) IV  40 mg Intravenous Q24H  . senna-docusate  2 tablet Per Tube BID  . sodium chloride  10-40 mL Intracatheter Q12H    OBJECTIVE  Filed Vitals:   07/28/14 0632 07/28/14 1018 07/28/14 1223 07/29/14 0455  BP: 96/69 117/66 125/65 125/61  Pulse: 103 108 97 107  Temp: 97.6 F (36.4 C)  97.9 F (36.6 C) 99 F (37.2 C)  TempSrc: Oral  Oral Oral  Resp: '20  19 18  '$ Height:      Weight:    160 lb 4.4 oz (72.7 kg)  SpO2: 97%  96% 91%    Intake/Output Summary (Last 24 hours) at 07/29/14 0951 Last data filed at 07/29/14 0459  Gross per 24 hour  Intake    834 ml  Output    700 ml  Net    134 ml   Filed Weights   07/24/14 2221 07/29/14 0455  Weight: 161 lb 13.1 oz (73.4 kg) 160 lb 4.4 oz (72.7 kg)    PHYSICAL EXAM  General: Pleasant, NAD. Neuro: Alert and oriented X 3. Moves all extremities spontaneously. Psych: Normal affect. HEENT:  Normal  Neck: Supple without bruits or  JVD. Lungs:  Resp regular and unlabored, CTA. Heart: RRR no s3, s4, or murmurs. Abdomen: Soft, PEG tube placed, non-distended, BS + x 4.  Extremities: No clubbing, cyanosis or edema. DP/PT/Radials 2+ and equal bilaterally.  Accessory Clinical Findings  CBC  Recent Labs  07/27/14 0520  WBC 12.3*  HGB 10.6*  HCT 33.9*  MCV 94.4  PLT 601*   Basic Metabolic Panel  Recent Labs  07/27/14 0520  NA 134*  K 3.8  CL 100*  CO2 25  GLUCOSE 99  BUN 10  CREATININE 0.62  CALCIUM 8.8*    TELE  Converted to NSR around 2:11AM this morning  Radiology/Studies  Dg Chest 2 View  07/24/2014   CLINICAL DATA:  Shortness of breath and difficulty swallowing.  EXAM: CHEST  2 VIEW  COMPARISON:  07/14/2014  FINDINGS: Right chest wall port a catheter is noted with tip in the cavoatrial junction. The heart size appears normal. Right pleural effusion is again identified. No left effusion. Right lower lobe lung mass is stable from previous exam.  IMPRESSION: 1. No acute findings and no significant change from 07/14/2014  Electronically Signed   By: Kerby Moors M.D.   On: 07/24/2014 16:01   Ct Angio Chest Pe W/cm &/or Wo Cm  07/14/2014   CLINICAL DATA:  74 year old male with history of lung cancer and COPD complaining of shortness of breath.  EXAM: CT ANGIOGRAPHY CHEST WITH CONTRAST  TECHNIQUE: Multidetector CT imaging of the chest was performed using the standard protocol during bolus administration of intravenous contrast. Multiplanar CT image reconstructions and MIPs were obtained to evaluate the vascular anatomy.  CONTRAST:  133m OMNIPAQUE IOHEXOL 350 MG/ML SOLN  COMPARISON:  Chest radiograph dated 07/14/2014 and CT dated 05/14/2014.  FINDINGS: Mild emphysematous changes. There is stable appearing 7.0 x 4.5 cm right lower lobe mass with encasement of the right middle and right lower lobe bronchi and occlusion of the right lower lobe bronchus. There is associated postobstructive atelectasis versus  pneumonia. Stable moderate right pleural effusion again noted. There is apparent extension of the mass into the right hilar and subcarinal region.  Mild atherosclerotic calcification of the aorta. No CT evidence of pulmonary embolism. There is encasement and complete occlusion of the right lower lobe pulmonary artery branch by the lung mass. No cardiomegaly. There is coronary vascular calcification. Small pericardial effusion.  Right pectoral Port-A-Cath with tip in central SVC. Partially visualized subcentimeter left hepatic hypodense lesion, incompletely characterized. Degenerative changes of the spine. No acute fracture.  Review of the MIP images confirms the above findings.  IMPRESSION: No CT evidence of pulmonary embolism.  Stable appearing right lower lobe mass with encasement of the right lower lobe pulmonary arteries branch as well as right lower lobe bronchus. There is postobstructive atelectasis/ pneumonia.  Grossly stable right pleural effusion.  Scratch   Electronically Signed   By: AAnner CreteM.D.   On: 07/14/2014 22:45   Ir Gastrostomy Tube Mod Sed  07/27/2014   CLINICAL DATA:  73year old male with a history of dysphagia/strictures. He has been referred for percutaneous gastrostomy placement.  EXAM: PERCUTANEOUS GASTROSTOMY  FLUOROSCOPY TIME:  2 minutes 30 seconds minutes  MEDICATIONS AND MEDICAL HISTORY: Versed 2.5 mg, Fentanyl 125 mcg.  2 g Ancef  ANESTHESIA/SEDATION: Moderate sedation time: 27 minutes  CONTRAST:  25 cc Omni 300 enteric  PROCEDURE: The procedure, risks, benefits, and alternatives were explained to the patient. Questions regarding the procedure were encouraged and answered. The patient understands and consents to the procedure.  The epigastrium was prepped with Betadine in a sterile fashion, and a sterile drape was applied covering the operative field. A sterile gown and sterile gloves were used for the procedure.  A 5-French orogastric tube is placed under fluoroscopic  guidance. Scout imaging of the abdomen confirms barium within the transverse colon.  The stomach was distended with gas. Under fluoroscopic guidance, an 18 gauge needle was utilized to puncture the anterior wall of the body of the stomach. An Amplatz wire was advanced through the needle passing a T fastener into the lumen of the stomach. The T fastener was secured for gastropexy. A second T fastener was deployed using an 18 gauge needle adjacent to the first deployment.  Serial dilation was performed over the Amplatz wire at the location of the first puncture, with placement of a 20 French peel-away sheath. An 18 gauge balloon retention gastrostomy tube was then placed through the peel-away sheath over the wire. Once we confirmed position, the wire and peel-away sheath were removed. Contrast infused through the tube confirmed position and AP and lateral position.  The patient tolerated the  procedure well and remained hemodynamically stable throughout.  No complications were encountered and no significant blood loss was encountered.  COMPLICATIONS: None  FINDINGS: The image demonstrates placement of a 20-French balloon retention gastrostomy tube into the body of the stomach.  IMPRESSION: Status post placement of 20 French balloon retention gastrostomy.  Signed,  Dulcy Fanny. Earleen Newport DO  [facility]  [facility]  PLAN: Decompression for 24 hours on low wall suction.  The tube may be used starting after 24 hours.   Electronically Signed   By: Corrie Mckusick D.O.   On: 07/27/2014 17:49   Dg Esophagus  06/29/2014   CLINICAL DATA:  Dysphagia and odynophagia. Choking and coughing. Solid food and liquids sticking in throat and chest. Previous radiation therapy and chemotherapy for lung carcinoma.  EXAM: ESOPHOGRAM / BARIUM SWALLOW / BARIUM TABLET STUDY  TECHNIQUE: Combined double contrast and single contrast examination performed using effervescent crystals, thick barium liquid,  and thin barium liquid. The patient was observed with fluoroscopy swallowing a 13 mm barium sulphate tablet.  FLUOROSCOPY TIME:  Fluoroscopy Time:  2 minutes 1 second  Number of Acquired Images:  11  COMPARISON:  Recent chest CT on 05/14/2014  FINDINGS: No evidence of vestibular penetration or aspiration during swallowing. The pharynx has a normal appearance.  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 This corresponds with subcarinal mediastinal lymphadenopathy seen on recent chest CT.  No definite findings of esophagitis noted. Esophageal motility is within normal limits. No evidence of hiatal hernia or gastroesophgeal reflux. An ingested 32m barium tablet passed freely through the esophagus, and into the stomach.  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 This stricture does not prevent passage of liquid barium or 13 mm barium tablet.   Electronically Signed   By: JEarle GellM.D.   On: 06/29/2014 14:33   Dg Chest Port 1 View  07/14/2014   CLINICAL DATA:  Worsening shortness of breath and chest pain. Currently on chemotherapy for right lower lobe lung carcinoma.  EXAM: PORTABLE CHEST - 1 VIEW  COMPARISON:  06/10/2014  FINDINGS: Right-sided power port remains in appropriate position. Right infrahilar mass like opacity shows no significant change allowing for differences in positioning. Pleural- parenchymal scarring at right lung base appears stable. No new areas of pulmonary opacity are identified. No evidence of pneumothorax or pleural effusion. Heart size is within normal limits.  IMPRESSION: No significant change in right infrahilar masslike opacity and right basilar scarring.   Electronically Signed   By: JEarle GellM.D.   On: 07/14/2014 19:33    ASSESSMENT AND PLAN  1. Persistent odynophagia/dysphagia and chest discomfort with recent esophageal  stricture from extrinsic compression and esophagitis  2. Metastatic non-small cell lung cancer  3. Chronic respiratory failure  4. Paroxysmal atrial flutter/fibrillation (not on anticoag due to oncologic issues, on flecainide) - converted to NSR this AM  - Now on '90mg'$  short acting liquid diltiazem q6HR via PEG tube .  - went back to a-flutter around 3:20pm on 7/18, converted back to NSR around 2:11 am on 7/21 after started on liquis short acting diltiazem suspension  - continue ASA, fleicainide and liquid diltiazem. No further cardiac recommendation.   5. H/o subclinical hypothyroidism - normal TSH in 06/2014  Signed, MAlmyra DeforestPA-C Pager [phone] seen and examined and history reviewed. Agree with above findings and plan.  Patient denies any cardiac complaints. Now in NSR. Even when in AFib/flutter rate is satisfactory. Will continue current doses of diltiazem and Flecainide. He is stable for DC from our standpoint and has a follow up appointment with Dr. Dorris Carnes on Monday.Please call with questions.  Peter Martinique, [place] 07/29/2014 11:06 AM

## 2014-07-29 NOTE — Progress Notes (Signed)
Pt & wife given instruction about Peg site care, tube feeding, water flushes, & medication administration via tube. They both done return demonstration & feel comfortable doing it at home.

## 2014-07-29 NOTE — Progress Notes (Signed)
Physical Therapy Treatment Patient Details Name: Steve [name] MRN: [id number] DOB: 1940-11-23 Today's Date: 07/29/2014    History of Present Illness 74 y.o. male with h/o lung cancer, s/p chemo and radiation,  peripheral neuropathy admitted with difficulty swallowing, a fib.    PT Comments    Pt feeling better today.  Assisted OOB to amb a greater distance in hallway.  Pt hopeful to go home soon (today).   Follow Up Recommendations  No PT follow up     Equipment Recommendations   (has a 3NT)    Recommendations for Other Services       Precautions / Restrictions Precautions Precautions: Fall Restrictions Weight Bearing Restrictions: No    Mobility  Bed Mobility Overal bed mobility: Needs Assistance       Supine to sit: Supervision;Min guard Sit to supine: Supervision;Min guard   General bed mobility comments: increased time with decreased c/o "soreness" ABD  Transfers Overall transfer level: Needs assistance Equipment used: 4-wheeled walker Transfers: Sit to/from Stand Sit to Stand: Supervision         General transfer comment: good safety cognition and use of hands  Ambulation/Gait Ambulation/Gait assistance: Supervision;Min guard Ambulation Distance (Feet): 85 Feet Assistive device: 4-wheeled walker Gait Pattern/deviations: Step-through pattern;Trunk flexed     General Gait Details: tolerated increased amb distance   Stairs            Wheelchair Mobility    Modified Rankin (Stroke Patients Only)       Balance                                    Cognition                            Exercises      General Comments        Pertinent Vitals/Pain      Home Living                      Prior Function            PT Goals (current goals can now be found in the care plan section) Progress towards PT goals: Progressing toward goals    Frequency  Min 3X/week    PT Plan      Co-evaluation              End of Session Equipment Utilized During Treatment: Gait belt Activity Tolerance: Patient tolerated treatment well Patient left: in bed;with call bell/phone within reach     Time: 0958-1010 PT Time Calculation (min) (ACUTE ONLY): 12 min  Charges:  $Gait Training: 8-22 mins                    G Codes:      Rica Koyanagi  PTA [facility]  Acute  Rehab Pager      [phone]

## 2014-07-29 NOTE — Progress Notes (Signed)
Pharmacy IV to PO conversion  The patient is receiving pantoprazole by the intravenous route.  Based on criteria approved by the Pharmacy and [facility], the medication is being converted to the equivalent oral dose form for administration through G-tube.   No active GI bleeding or impaired absorption  Not s/p esophagectomy  Documented ability to take oral medications for > 24 hr  Plan to continue treatment for at least 1 day  If you have any questions about this conversion, please contact the Pharmacy Department (ext [phone]).  Thank you.  Reuel Boom, PharmD Pager: [phone] 07/29/2014, 1:45 PM

## 2014-07-29 NOTE — Progress Notes (Signed)
NUTRITION NOTE  Pt seen for full follow-up assessment yesterday. RN paged RD earlier this AM and stated that pt has been feeling full after TF boluses and wanted to talk with RD.   Pt on FLD and had requested prune juice as he is wanting to have a BM; RD called and ordered this for pt. He states that he has been feeling slight abdominal pain after TF boluses. He indicates that he has not been consuming much PO and that his stomach "is shrunken and needs time to stretched." Asked pt if he was receiving 1/2 can or full can of TF at each bolus and he indicates full cans have been given. Talked with pt and his wife about RD desire to start TF slowly and slowly advance to goal. Communicated with RN concerning order for 1/2 can Osmolite 1.5 QID placed by RD yesterday. This is to ensure pt comfort and to limit risk of refeeding.  Continue 1/2 can Osmolite 1.5 QID today and RD will re-assess tomorrow (07/30/14) for possibility to advance. If pt continues to have abdominal discomfort with TF boluses or if there is concern for slow motility, recommend order for prokinetic agent.   RD will continue to follow per protocol.    Jarome Matin, RD, LDN Inpatient Clinical Dietitian Pager # [phone] After hours/weekend pager # [phone]

## 2014-07-29 NOTE — Care Management Important Message (Signed)
Important Message  Patient Details  Name: Steve [name] MRN: [id number] Date of Birth: 26-Jan-1940   Medicare Important Message Given:  Yes-third notification given    Camillo Flaming 07/29/2014, 11:45 AMImportant Message  Patient Details  Name: Steve [name] MRN: [id number] Date of Birth: [birth year]   Medicare Important Message Given:  Yes-third notification given    Camillo Flaming 07/29/2014, 11:45 AM

## 2014-07-30 ENCOUNTER — Telehealth: Payer: Self-pay | Admitting: Internal Medicine

## 2014-07-30 ENCOUNTER — Other Ambulatory Visit: Payer: Self-pay | Admitting: Medical Oncology

## 2014-07-30 DIAGNOSIS — K222 Esophageal obstruction: Secondary | ICD-10-CM | POA: Diagnosis not present

## 2014-07-30 DIAGNOSIS — C3490 Malignant neoplasm of unspecified part of unspecified bronchus or lung: Secondary | ICD-10-CM | POA: Diagnosis not present

## 2014-07-30 DIAGNOSIS — Z431 Encounter for attention to gastrostomy: Secondary | ICD-10-CM | POA: Diagnosis not present

## 2014-07-30 DIAGNOSIS — R1319 Other dysphagia: Secondary | ICD-10-CM | POA: Diagnosis not present

## 2014-07-30 MED ORDER — DILTIAZEM HCL 90 MG PO TABS: 90.0000 mg | ORAL_TABLET | Freq: Four times a day (QID) | ORAL | Status: AC

## 2014-07-30 NOTE — Telephone Encounter (Signed)
Wife called and can't get liquid Cardizem filled at her convenient [employer].  She would need to seek out a compounding pharmacy and just can't manage to get this done.  Discussed with Ronnald Collum pharmacist. She said if it wasn't time release it could be crushed.    Cardizem order changed from liquid to the equivilant pill  Patients wife called and told her prescription was sent to [facility] as his other ones

## 2014-07-30 NOTE — Telephone Encounter (Signed)
New Message       Pt's wife calling wanting to know if pt can get switched to Cartizem tablets. Please call back and advise.

## 2014-08-02 ENCOUNTER — Encounter: Payer: Self-pay | Admitting: Internal Medicine

## 2014-08-02 ENCOUNTER — Ambulatory Visit (INDEPENDENT_AMBULATORY_CARE_PROVIDER_SITE_OTHER): Payer: Medicare HMO | Admitting: Internal Medicine

## 2014-08-02 VITALS — BP 110/52 | HR 111 | Ht 71.0 in | Wt 163.0 lb

## 2014-08-02 DIAGNOSIS — I4891 Unspecified atrial fibrillation: Secondary | ICD-10-CM | POA: Diagnosis not present

## 2014-08-02 DIAGNOSIS — R002 Palpitations: Secondary | ICD-10-CM | POA: Diagnosis not present

## 2014-08-02 NOTE — Progress Notes (Signed)
Cardiology Office Note   Date:  08/02/2014   ID:  Steve [name], DOB 05-29-1940, MRN [id number]  PCP:  [name], MD  Cardiologist:   Dorris Carnes, MD   No chief complaint on file.  F/U of intermitt atrial fib and SVT    History of Present Illness: Steve [name] is a 74 y.o. male with a history of Paroxysmal atrial flutter/fib  Not on coumadin  On Flecanide.  Also a history of metastatic nonsmall cell Ca lung.  Odynophagia/dysphagia  Since I saw him in April he has been more SOB   He has not had any dizzy spells like he did with the SVT Does have problems swallowing  Is on feeding tube Overall does not feel good        Current Outpatient Prescriptions  Medication Sig Dispense Refill  . Amino Acids-Protein Hydrolys (FEEDING SUPPLEMENT, PRO-STAT SUGAR FREE 64,) LIQD Place 30 mLs into feeding tube 2 (two) times daily. 900 mL 0  . aspirin 81 MG tablet Place 1 tablet (81 mg total) into feeding tube daily. 30 tablet 0  . diltiazem (CARDIZEM) 90 MG tablet Take 1 tablet (90 mg total) by mouth every 6 (six) hours. 360 tablet 4  . flecainide (TAMBOCOR) 100 MG tablet Place 1 tablet (100 mg total) into feeding tube 2 (two) times daily. 30 tablet 0  . lactose free nutrition (BOOST PLUS) LIQD Take 237 mLs by mouth 2 (two) times daily between meals.  0  . lidocaine (XYLOCAINE) 2 % solution Use as directed 20 mLs in the mouth or throat every 6 (six) hours as needed for mouth pain. 480 mL 1  . Nutritional Supplements (FEEDING SUPPLEMENT, OSMOLITE 1.5 CAL,) LIQD Place 237 mLs into feeding tube 4 (four) times daily. 90 Bottle 0  . omeprazole (PRILOSEC) 40 MG capsule Take 40 mg by mouth 2 (two) times daily.   4  . OxyCODONE (OXYCONTIN) 15 mg T12A 12 hr tablet Take 1 tablet (15 mg total) by mouth at bedtime. 30 tablet 0  . oxyCODONE (ROXICODONE) 5 MG/5ML solution Take 5 mLs (5 mg total) by mouth every 4 (four) hours as needed for moderate pain. 473 mL 0  . pantoprazole sodium (PROTONIX) 40  mg/20 mL PACK Place 40 mLs (80 mg total) into feeding tube daily. 30 each 1  . simvastatin (ZOCOR) 20 MG tablet Place 1 tablet (20 mg total) into feeding tube daily. Take one tablet by mouth once daily to lower cholesterol 90 tablet 3  . temazepam (RESTORIL) 30 MG capsule Place 1 capsule (30 mg total) into feeding tube at bedtime as needed for sleep. 30 capsule 0  . Water For Irrigation, Sterile (FREE WATER) SOLN Place 120 mLs into feeding tube 4 (four) times daily.     No current facility-administered medications for this visit.   Facility-Administered Medications Ordered in Other Visits  Medication Dose Route Frequency Provider Last Rate Last Dose  . sodium chloride 0.9 % injection 10 mL  10 mL Intravenous PRN Curt Bears, MD   10 mL at 06/08/14 1216    Allergies:   Morphine and related   Past Medical History  Diagnosis Date  . Dizziness and giddiness     positional vertigo  . Hyperlipidemia   . Impotence of organic origin   . Osteoarthritis, shoulder   . Allergy   . Hypothyroidism     pt denies thryoid disease, dr pandey note 11-19-12 says subclinical hypothryoid in epic  . COPD (chronic obstructive  pulmonary disease)     pt denies  . Insomnia   . Hx of radiation therapy 02/2013    right lung  . Diabetes mellitus without complication     pt denies diabetes, dr pandey note says dm 11-19-12 epic  . 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 Malignant neoplasm of bladder, part unspecified   . Malignant neoplasm of bronchus and lung, unspecified site   . PAF (paroxysmal atrial fibrillation)     a. not on anticoag due to oncologic issues, on flecainide.  . Paroxysmal SVT (supraventricular tachycardia)   . Chronic respiratory failure   . Esophageal stricture   . Esophagitis   . Reactive airway disease     Past Surgical History  Procedure Laterality Date  . Cystoscopy w/ dilation of bladder    . Tonsillectomy  age 73  . Endobronchial ultrasound Bilateral  12/15/2012    Procedure: ENDOBRONCHIAL ULTRASOUND;  Surgeon: Brand Males, MD;  Location: [facility] END[facility];  Service: Cardiopulmonary;  Laterality: Bilateral;  . Colonoscopy  2009    Cut Off GI     Social History:  The patient  reports that he quit smoking about 7 years ago. His smoking use included Cigarettes. He has a 75 pack-year smoking history. He quit smokeless tobacco use about 2 months ago. His smokeless tobacco use included Chew. He reports that he does not drink alcohol or use illicit drugs.   Family History:  The patient's family history includes Cancer in his father; Diabetes in his mother; Hypertension in his mother; Kidney disease in his brother; Stroke in his father.    ROS:  Please see the history of present illness. All other systems are reviewed and  Negative to the above problem except as noted.    PHYSICAL EXAM: VS:  BP 110/52 mmHg  Pulse 111  Ht '5\' 11"'$  (1.803 m)  Wt 163 lb (73.936 kg)  BMI 22.74 kg/m2  GEN: Well nourished, well developed, in no acute distress HEENT: normal Neck: no JVD, carotid bruits, or masses Cardiac: RRR; no murmurs, rubs, or gallops,no edema  Respiratory: Decreased airflow  Some wheezes, rhonchi GI: soft, nontender, nondistended, + BS  No hepatomegaly  MS: no deformity Moving all extremities   Skin: warm and dry, no rash Neuro:  Strength and sensation are intact Psych: euthymic mood, full affect   EKG:  EKG is ordered today.  ST 113 bpm     Lipid Panel    Component Value Date/Time   CHOL 180 04/28/2013 0819   TRIG 167* 04/28/2013 0819   HDL 37* 04/28/2013 0819   CHOLHDL 4.9 04/28/2013 0819   LDLCALC 110* 04/28/2013 0819      Wt Readings from Last 3 Encounters:  08/02/14 163 lb (73.936 kg)  07/29/14 160 lb 4.4 oz (72.7 kg)  07/16/14 173 lb 1 oz (78.5 kg)      ASSESSMENT AND PLAN: 1Dyspnea.  I think the primary problem is pulmonary.  CT with  RLL mass with encasement of broncus and arteries.   Volume status appears OK I  would set up for event monitor to see if symptoms correlate with tachycarrhythmia  2.  Rhythm  Continue meds  Event monitor as noted    F/U later in fall Pt has appt at [facility] this week. Signed, Dorris Carnes, MD  08/02/2014 10:03 AM    [address], [place], [place]  [address] Phone: [phone]; Fax: [phone]

## 2014-08-02 NOTE — Patient Instructions (Signed)
Medication Instructions:  Your physician recommends that you continue on your current medications as directed. Please refer to the Current Medication list given to you today.    Labwork:   Testing/Procedures:  Your physician has recommended that you wear an event monitor. Event monitors are medical devices that record the heart's electrical activity. Doctors most often [place] these monitors to diagnose arrhythmias. Arrhythmias are problems with the speed or rhythm of the heartbeat. The monitor is a small, portable device. You can wear one while you do your normal daily activities. This is usually used to diagnose what is causing palpitations/syncope (passing out).   Follow-Up:  3 MONTHS WITH  DR ROSS    Any Other Special Instructions Will Be Listed Below (If Applicable).

## 2014-08-05 ENCOUNTER — Emergency Department (HOSPITAL_COMMUNITY): Payer: Medicare HMO

## 2014-08-05 ENCOUNTER — Inpatient Hospital Stay (HOSPITAL_COMMUNITY)
Admission: EM | Admit: 2014-08-05 | Discharge: 2014-08-13 | DRG: 393 | Disposition: A | Discharge status: Hospice | Payer: Medicare HMO | Attending: Internal Medicine | Admitting: Internal Medicine

## 2014-08-05 ENCOUNTER — Encounter (HOSPITAL_COMMUNITY): Payer: Self-pay

## 2014-08-05 DIAGNOSIS — G8929 Other chronic pain: Secondary | ICD-10-CM | POA: Diagnosis present

## 2014-08-05 DIAGNOSIS — R1314 Dysphagia, pharyngoesophageal phase: Secondary | ICD-10-CM | POA: Diagnosis present

## 2014-08-05 DIAGNOSIS — J449 Chronic obstructive pulmonary disease, unspecified: Secondary | ICD-10-CM | POA: Diagnosis present

## 2014-08-05 DIAGNOSIS — F17220 Nicotine dependence, chewing tobacco, uncomplicated: Secondary | ICD-10-CM | POA: Diagnosis present

## 2014-08-05 DIAGNOSIS — K668 Other specified disorders of peritoneum: Secondary | ICD-10-CM | POA: Diagnosis present

## 2014-08-05 DIAGNOSIS — M19019 Primary osteoarthritis, unspecified shoulder: Secondary | ICD-10-CM | POA: Diagnosis present

## 2014-08-05 DIAGNOSIS — D72819 Decreased white blood cell count, unspecified: Secondary | ICD-10-CM | POA: Diagnosis present

## 2014-08-05 DIAGNOSIS — Z809 Family history of malignant neoplasm, unspecified: Secondary | ICD-10-CM

## 2014-08-05 DIAGNOSIS — T451X5A Adverse effect of antineoplastic and immunosuppressive drugs, initial encounter: Secondary | ICD-10-CM | POA: Diagnosis present

## 2014-08-05 DIAGNOSIS — K9429 Other complications of gastrostomy: Secondary | ICD-10-CM | POA: Diagnosis present

## 2014-08-05 DIAGNOSIS — Z885 Allergy status to narcotic agent status: Secondary | ICD-10-CM

## 2014-08-05 DIAGNOSIS — I4891 Unspecified atrial fibrillation: Secondary | ICD-10-CM | POA: Diagnosis present

## 2014-08-05 DIAGNOSIS — E119 Type 2 diabetes mellitus without complications: Secondary | ICD-10-CM | POA: Diagnosis present

## 2014-08-05 DIAGNOSIS — J90 Pleural effusion, not elsewhere classified: Secondary | ICD-10-CM | POA: Diagnosis present

## 2014-08-05 DIAGNOSIS — Z931 Gastrostomy status: Secondary | ICD-10-CM | POA: Diagnosis not present

## 2014-08-05 DIAGNOSIS — C3431 Malignant neoplasm of lower lobe, right bronchus or lung: Secondary | ICD-10-CM | POA: Diagnosis present

## 2014-08-05 DIAGNOSIS — Z79899 Other long term (current) drug therapy: Secondary | ICD-10-CM | POA: Diagnosis not present

## 2014-08-05 DIAGNOSIS — R1310 Dysphagia, unspecified: Secondary | ICD-10-CM | POA: Diagnosis not present

## 2014-08-05 DIAGNOSIS — Z789 Other specified health status: Secondary | ICD-10-CM | POA: Diagnosis not present

## 2014-08-05 DIAGNOSIS — Z7982 Long term (current) use of aspirin: Secondary | ICD-10-CM

## 2014-08-05 DIAGNOSIS — Z515 Encounter for palliative care: Secondary | ICD-10-CM | POA: Diagnosis not present

## 2014-08-05 DIAGNOSIS — R0600 Dyspnea, unspecified: Secondary | ICD-10-CM | POA: Diagnosis present

## 2014-08-05 DIAGNOSIS — R1012 Left upper quadrant pain: Secondary | ICD-10-CM | POA: Diagnosis present

## 2014-08-05 DIAGNOSIS — Z823 Family history of stroke: Secondary | ICD-10-CM | POA: Diagnosis not present

## 2014-08-05 DIAGNOSIS — D6481 Anemia due to antineoplastic chemotherapy: Secondary | ICD-10-CM | POA: Diagnosis present

## 2014-08-05 DIAGNOSIS — Z85118 Personal history of other malignant neoplasm of bronchus and lung: Secondary | ICD-10-CM | POA: Diagnosis not present

## 2014-08-05 DIAGNOSIS — Z833 Family history of diabetes mellitus: Secondary | ICD-10-CM | POA: Diagnosis not present

## 2014-08-05 DIAGNOSIS — R0602 Shortness of breath: Secondary | ICD-10-CM | POA: Diagnosis not present

## 2014-08-05 DIAGNOSIS — Z8249 Family history of ischemic heart disease and other diseases of the circulatory system: Secondary | ICD-10-CM

## 2014-08-05 DIAGNOSIS — K2210 Ulcer of esophagus without bleeding: Secondary | ICD-10-CM | POA: Diagnosis present

## 2014-08-05 DIAGNOSIS — Z79891 Long term (current) use of opiate analgesic: Secondary | ICD-10-CM | POA: Diagnosis not present

## 2014-08-05 DIAGNOSIS — E46 Unspecified protein-calorie malnutrition: Secondary | ICD-10-CM | POA: Diagnosis not present

## 2014-08-05 DIAGNOSIS — J9809 Other diseases of bronchus, not elsewhere classified: Secondary | ICD-10-CM | POA: Insufficient documentation

## 2014-08-05 DIAGNOSIS — E785 Hyperlipidemia, unspecified: Secondary | ICD-10-CM | POA: Diagnosis present

## 2014-08-05 DIAGNOSIS — E43 Unspecified severe protein-calorie malnutrition: Secondary | ICD-10-CM | POA: Insufficient documentation

## 2014-08-05 DIAGNOSIS — Z6822 Body mass index (BMI) 22.0-22.9, adult: Secondary | ICD-10-CM | POA: Diagnosis not present

## 2014-08-05 DIAGNOSIS — C799 Secondary malignant neoplasm of unspecified site: Secondary | ICD-10-CM | POA: Diagnosis present

## 2014-08-05 DIAGNOSIS — I471 Supraventricular tachycardia: Secondary | ICD-10-CM | POA: Diagnosis present

## 2014-08-05 DIAGNOSIS — I480 Paroxysmal atrial fibrillation: Secondary | ICD-10-CM | POA: Diagnosis present

## 2014-08-05 DIAGNOSIS — E440 Moderate protein-calorie malnutrition: Secondary | ICD-10-CM | POA: Diagnosis not present

## 2014-08-05 DIAGNOSIS — K222 Esophageal obstruction: Secondary | ICD-10-CM

## 2014-08-05 DIAGNOSIS — Z66 Do not resuscitate: Secondary | ICD-10-CM | POA: Diagnosis present

## 2014-08-05 DIAGNOSIS — C3481 Malignant neoplasm of overlapping sites of right bronchus and lung: Secondary | ICD-10-CM | POA: Diagnosis not present

## 2014-08-05 DIAGNOSIS — K2960 Other gastritis without bleeding: Secondary | ICD-10-CM | POA: Diagnosis present

## 2014-08-05 DIAGNOSIS — R062 Wheezing: Secondary | ICD-10-CM | POA: Diagnosis present

## 2014-08-05 HISTORY — DX: Acute upper respiratory infection, unspecified: J06.9

## 2014-08-05 HISTORY — DX: Chronic ethmoidal sinusitis: J32.2

## 2014-08-05 HISTORY — DX: Orthostatic hypotension: I95.1

## 2014-08-05 HISTORY — DX: Syncope and collapse: R55

## 2014-08-05 LAB — CBC WITH DIFFERENTIAL/PLATELET
BASOS ABS: 0 10*3/uL (ref 0.0–0.1)
Basophils Relative: 0 % (ref 0–1)
Eosinophils Absolute: 0 10*3/uL (ref 0.0–0.7)
Eosinophils Relative: 0 % (ref 0–5)
HCT: 32.9 % — ABNORMAL LOW (ref 39.0–52.0)
Hemoglobin: 10.4 g/dL — ABNORMAL LOW (ref 13.0–17.0)
LYMPHS ABS: 0.9 10*3/uL (ref 0.7–4.0)
Lymphocytes Relative: 8 % — ABNORMAL LOW (ref 12–46)
MCH: 28.6 pg (ref 26.0–34.0)
MCHC: 31.6 g/dL (ref 30.0–36.0)
MCV: 90.4 fL (ref 78.0–100.0)
MONO ABS: 0.2 10*3/uL (ref 0.1–1.0)
Monocytes Relative: 2 % — ABNORMAL LOW (ref 3–12)
NEUTROS ABS: 11 10*3/uL — AB (ref 1.7–7.7)
NEUTROS PCT: 90 % — AB (ref 43–77)
Platelets: 613 10*3/uL — ABNORMAL HIGH (ref 150–400)
RBC: 3.64 MIL/uL — ABNORMAL LOW (ref 4.22–5.81)
RDW: 16.2 % — ABNORMAL HIGH (ref 11.5–15.5)
WBC: 12.1 10*3/uL — ABNORMAL HIGH (ref 4.0–10.5)

## 2014-08-05 LAB — COMPREHENSIVE METABOLIC PANEL
ALT: 38 U/L (ref 17–63)
AST: 24 U/L (ref 15–41)
Albumin: 2.5 g/dL — ABNORMAL LOW (ref 3.5–5.0)
Alkaline Phosphatase: 97 U/L (ref 38–126)
Anion gap: 10 (ref 5–15)
BILIRUBIN TOTAL: 0.4 mg/dL (ref 0.3–1.2)
BUN: 10 mg/dL (ref 6–20)
CHLORIDE: 94 mmol/L — AB (ref 101–111)
CO2: 29 mmol/L (ref 22–32)
CREATININE: 0.57 mg/dL — AB (ref 0.61–1.24)
Calcium: 9.5 mg/dL (ref 8.9–10.3)
GFR calc non Af Amer: >60 mL/min (ref >60)
GLUCOSE: 168 mg/dL — AB (ref 65–99)
POTASSIUM: 3.6 mmol/L (ref 3.5–5.1)
Sodium: 133 mmol/L — ABNORMAL LOW (ref 135–145)
TOTAL PROTEIN: 6.7 g/dL (ref 6.5–8.1)

## 2014-08-05 LAB — ABO/RH: ABO/RH(D): O NEG

## 2014-08-05 LAB — TYPE AND SCREEN
ABO/RH(D): O NEG
Antibody Screen: NEGATIVE

## 2014-08-05 LAB — I-STAT TROPONIN, ED: Troponin i, poc: 0.01 ng/mL (ref 0.00–0.08)

## 2014-08-05 LAB — I-STAT CG4 LACTIC ACID, ED: LACTIC ACID, VENOUS: 1.36 mmol/L (ref 0.5–2.0)

## 2014-08-05 MED ORDER — DILTIAZEM HCL 25 MG/5ML IV SOLN
10.0000 mg | Freq: Once | INTRAVENOUS | Status: AC
  Administered 2014-08-05: 10 mg via INTRAVENOUS
  Filled 2014-08-05: qty 5

## 2014-08-05 MED ORDER — PIPERACILLIN-TAZOBACTAM 3.375 G IVPB 30 MIN
3.3750 g | Freq: Once | INTRAVENOUS | Status: AC
  Administered 2014-08-05: 3.375 g via INTRAVENOUS
  Filled 2014-08-05: qty 50

## 2014-08-05 MED ORDER — DEXTROSE 5 % IV SOLN
5.0000 mg/h | Freq: Once | INTRAVENOUS | Status: AC
  Administered 2014-08-05: 5 mg/h via INTRAVENOUS
  Filled 2014-08-05: qty 100

## 2014-08-05 MED ORDER — HYDROMORPHONE HCL 1 MG/ML IJ SOLN
1.0000 mg | Freq: Once | INTRAMUSCULAR | Status: AC
  Administered 2014-08-05: 1 mg via INTRAVENOUS
  Filled 2014-08-05: qty 1

## 2014-08-05 MED ORDER — IOHEXOL 300 MG/ML  SOLN
100.0000 mL | Freq: Once | INTRAMUSCULAR | Status: AC | PRN
Start: 2014-08-05 — End: 2014-08-05
  Administered 2014-08-05: 100 mL via INTRAVENOUS

## 2014-08-05 MED ORDER — SODIUM CHLORIDE 0.9 % IV BOLUS (SEPSIS)
1000.0000 mL | Freq: Once | INTRAVENOUS | Status: AC
  Administered 2014-08-05: 1000 mL via INTRAVENOUS

## 2014-08-05 MED ORDER — IOHEXOL 300 MG/ML  SOLN
25.0000 mL | Freq: Once | INTRAMUSCULAR | Status: AC | PRN
  Administered 2014-08-05: 25 mL via ORAL

## 2014-08-05 MED ORDER — DILTIAZEM HCL 90 MG PO TABS: 90.0000 mg | ORAL_TABLET | Freq: Once | ORAL | Status: DC

## 2014-08-05 NOTE — ED Provider Notes (Signed)
CSN: [id number]     Arrival date & time 08/05/14  1655 History   First MD Initiated Contact with Patient 08/05/14 1740     Chief Complaint  Patient presents with  . Abdominal Problem      (Consider location/radiation/quality/duration/timing/severity/associated sxs/prior Treatment) HPI Comments: P had bronchial stent placement at [facility] today for known non-small cell lung cancer, per the pulmonary fellow stent placement was unremarkable. They obtained a postop film to confirm stent placement.  The patient was found to have small amount subdiaphragmatic of free air.  Patient reports having chronic left upper quadrant pain radiating to his chest daily for several months.  He had a recent endoscopy which showed esophageal ulcers, and erosive gastritis.  He states pain is not any worse today.  He has had a G-tube placed for oral supplementation several weeks ago as well.   Patient is a 74 y.o. male presenting with abdominal pain. The history is provided by the patient. No language interpreter was used.  Abdominal Pain Pain location:  LUQ Pain quality: shooting   Pain radiates to:  Chest Pain severity:  Moderate Duration: months. Timing:  Constant Progression:  Unchanged Chronicity:  Chronic Context comment:  Known gastric ulcers Relieved by:  Nothing Worsened by:  Nothing tried Ineffective treatments:  None tried Associated symptoms: anorexia, chest pain, cough and shortness of breath   Associated symptoms: no constipation, no diarrhea, no dysuria, no fatigue, no fever, no flatus, no nausea and no vomiting   Risk factors: being elderly     Past Medical History  Diagnosis Date  . Dizziness and giddiness     positional vertigo  . Hyperlipidemia   . Impotence of organic origin   . Osteoarthritis, shoulder   . Allergy   . Hypothyroidism     pt denies thryoid disease, dr pandey note 11-19-12 says subclinical hypothryoid in epic  . COPD (chronic obstructive pulmonary disease)     pt  denies  . Insomnia   . Hx of radiation therapy 02/2013    right lung  . Diabetes mellitus without complication     pt denies diabetes, dr pandey note says dm 11-19-12 epic  . 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 Malignant neoplasm of bladder, part unspecified   . Malignant neoplasm of bronchus and lung, unspecified site   . PAF (paroxysmal atrial fibrillation)     a. not on anticoag due to oncologic issues, on flecainide.  . Paroxysmal SVT (supraventricular tachycardia)   . Chronic respiratory failure   . Esophageal stricture   . Esophagitis   . Reactive airway disease   . Acute URI 07/29/2012  . Syncope 12/21/2013    Jan 2016 with documented orthostatic B/P and rapid PSVT on holter   . Ethmoid sinusitis 03/10/2014  . Orthostatic hypotension-on Midodrine 07/15/2014   Past Surgical History  Procedure Laterality Date  . Cystoscopy w/ dilation of bladder    . Tonsillectomy  age 89  . Endobronchial ultrasound Bilateral 12/15/2012    Procedure: ENDOBRONCHIAL ULTRASOUND;  Surgeon: Brand Males, MD;  Location: [facility] ENDOSCOPY;  Service: Cardiopulmonary;  Laterality: Bilateral;  . Colonoscopy  2009    [facility]   Family History  Problem Relation Age of Onset  . Kidney disease Brother   . Hypertension Mother   . Diabetes Mother   . Cancer Father     type unknown  . Stroke Father    History  Substance Use Topics  . Smoking status: Former  Smoker -- 1.50 packs/day for 50 years    Types: Cigarettes    Quit date: 01/09/2007  . Smokeless tobacco: Former Systems developer    Types: Chew    Quit date: 05/28/2014  . Alcohol Use: No    Review of Systems  Constitutional: Negative for fever, activity change, appetite change and fatigue.  HENT: Negative for congestion, facial swelling, rhinorrhea and trouble swallowing.   Eyes: Negative for photophobia and pain.  Respiratory: Positive for cough and shortness of breath. Negative for chest tightness.   Cardiovascular: Positive for  chest pain. Negative for leg swelling.  Gastrointestinal: Positive for abdominal pain and anorexia. Negative for nausea, vomiting, diarrhea, constipation and flatus.  Endocrine: Negative for polydipsia and polyuria.  Genitourinary: Negative for dysuria, urgency, decreased urine volume and difficulty urinating.  Musculoskeletal: Negative for back pain and gait problem.  Skin: Negative for color change, rash and wound.  Allergic/Immunologic: Negative for immunocompromised state.  Neurological: Negative for dizziness, facial asymmetry, speech difficulty, weakness, numbness and headaches.  Psychiatric/Behavioral: Negative for confusion, decreased concentration and agitation.      Allergies  Morphine and Morphine and related  Home Medications   Prior to Admission medications   Medication Sig Start Date End Date Taking? Authorizing Provider  albuterol (PROVENTIL) (5 MG/ML) 0.5% nebulizer solution Inhale 0.5 mLs into the lungs 2 (two) times daily. 08/05/14 11/03/14 Yes Historical Provider, MD  aspirin 81 MG tablet Place 1 tablet (81 mg total) into feeding tube daily. 07/29/14  Yes Velvet Bathe, MD  diltiazem (CARDIZEM) 90 MG tablet Take 1 tablet (90 mg total) by mouth every 6 (six) hours. 07/30/14  Yes Fay Records, MD  flecainide (TAMBOCOR) 100 MG tablet Place 1 tablet (100 mg total) into feeding tube 2 (two) times daily. 07/30/14  Yes Velvet Bathe, MD  guaiFENesin (MUCINEX) 600 MG 12 hr tablet Take 600 mg by mouth every 12 (twelve) hours as needed for cough.   Yes Historical Provider, MD  lactose free nutrition (BOOST PLUS) LIQD Take 237 mLs by mouth 2 (two) times daily between meals. 07/29/14  Yes Velvet Bathe, MD  lidocaine (XYLOCAINE) 2 % solution Use as directed 20 mLs in the mouth or throat every 6 (six) hours as needed for mouth pain. 06/22/14  Yes Maryanna Shape, NP  Nutritional Supplements (FEEDING SUPPLEMENT, OSMOLITE 1.5 CAL,) LIQD Place 237 mLs into feeding tube 4 (four) times daily.  07/29/14  Yes Velvet Bathe, MD  omeprazole (PRILOSEC) 40 MG capsule Take 40 mg by mouth 2 (two) times daily.  07/22/14  Yes Historical Provider, MD  oxyCODONE (ROXICODONE) 5 MG/5ML solution Take 5 mLs (5 mg total) by mouth every 4 (four) hours as needed for moderate pain. 07/29/14  Yes Velvet Bathe, MD  polyethylene glycol (MIRALAX / GLYCOLAX) packet Take 17 g by mouth daily as needed for mild constipation or moderate constipation.    Yes Historical Provider, MD  simvastatin (ZOCOR) 20 MG tablet Place 1 tablet (20 mg total) into feeding tube daily. Take one tablet by mouth once daily to lower cholesterol 07/29/14  Yes Velvet Bathe, MD  sodium chloride HYPERTONIC 3 % nebulizer solution Inhale 3 mLs into the lungs 2 (two) times daily. 08/05/14 11/03/14 Yes Historical Provider, MD  temazepam (RESTORIL) 30 MG capsule Place 1 capsule (30 mg total) into feeding tube at bedtime as needed for sleep. 07/29/14  Yes Velvet Bathe, MD  Water For Irrigation, Sterile (FREE WATER) SOLN Place 120 mLs into feeding tube 4 (four) times daily. 07/29/14  Yes Velvet Bathe, MD  Amino Acids-Protein Hydrolys (FEEDING SUPPLEMENT, PRO-STAT SUGAR FREE 64,) LIQD Place 30 mLs into feeding tube 2 (two) times daily. Patient not taking: Reported on 08/05/2014 07/29/14   Velvet Bathe, MD  OxyCODONE (OXYCONTIN) 15 mg T12A 12 hr tablet Take 1 tablet (15 mg total) by mouth at bedtime. Patient not taking: Reported on 08/05/2014 07/29/14   Velvet Bathe, MD  pantoprazole sodium (PROTONIX) 40 mg/20 mL PACK Place 40 mLs (80 mg total) into feeding tube daily. Patient not taking: Reported on 08/05/2014 07/17/14   Hosie Poisson, MD   BP 111/75 mmHg  Pulse 98  Temp(Src) 97.7 F (36.5 C) (Oral)  Resp 14  SpO2 98% Physical Exam  Constitutional: He is oriented to person, place, and time. He appears well-developed and well-nourished. No distress.  HENT:  Head: Normocephalic and atraumatic.  Mouth/Throat: No oropharyngeal exudate.  Eyes: Pupils are equal,  round, and reactive to light.  Neck: Normal range of motion. Neck supple.  Cardiovascular: Normal rate, regular rhythm and normal heart sounds.  Exam reveals no gallop and no friction rub.   No murmur heard. Pulmonary/Chest: Effort normal and breath sounds normal. No respiratory distress. He has no wheezes. He has no rales.  Abdominal: Soft. Bowel sounds are normal. He exhibits no distension and no mass. There is tenderness in the left upper quadrant. There is no rebound and no guarding.    Musculoskeletal: Normal range of motion. He exhibits no edema or tenderness.  Neurological: He is alert and oriented to person, place, and time.  Skin: Skin is warm and dry.  Psychiatric: He has a normal mood and affect.    ED Course  Procedures (including critical care time) Labs Review Labs Reviewed  CBC WITH DIFFERENTIAL/PLATELET - Abnormal; Notable for the following:    WBC 12.1 (*)    RBC 3.64 (*)    Hemoglobin 10.4 (*)    HCT 32.9 (*)    RDW 16.2 (*)    Platelets 613 (*)    Neutrophils Relative % 90 (*)    Neutro Abs 11.0 (*)    Lymphocytes Relative 8 (*)    Monocytes Relative 2 (*)    All other components within normal limits  COMPREHENSIVE METABOLIC PANEL - Abnormal; Notable for the following:    Sodium 133 (*)    Chloride 94 (*)    Glucose, Bld 168 (*)    Creatinine, Ser 0.57 (*)    Albumin 2.5 (*)    All other components within normal limits  I-STAT CG4 LACTIC ACID, ED  I-STAT TROPOININ, ED  TYPE AND SCREEN  ABO/RH    Imaging Review Ct Chest W Contrast  08/05/2014   CLINICAL DATA:  Left mainstem bronchus stent placed this morning. Was found to have free intraperitoneal air and was sent to the emergency room.  EXAM: CT CHEST, ABDOMEN, AND PELVIS WITH CONTRAST  TECHNIQUE: Multidetector CT imaging of the chest, abdomen and pelvis was performed following the standard protocol during bolus administration of intravenous contrast.  CONTRAST:  19m OMNIPAQUE IOHEXOL 300 MG/ML SOLN,  1034mOMNIPAQUE IOHEXOL 300 MG/ML SOLN  COMPARISON:  07/14/2014  FINDINGS: CT CHEST FINDINGS  The left mainstem bronchus stent appears well-positioned. Endo stent lumen is widely patent. There is no significant change in the peripherally enhancing centrally hypodense 5.5 cm sub- carinal mass. There is no significant change in the central right lower lobe peripherally enhancing 4.7 x 7.2 cm mass. There is a moderate right pleural effusion which is marginally  larger than on 07/14/2014. There is consolidation and air bronchograms in the posterior basal segment of the left lower lobe, new, and there is a lesser degree of consolidation and volume loss in the lateral basal segment. There is a small pericardial effusion, unchanged. There is mild esophageal dilatation with an air-fluid level and this appears a little worsened. It may be due to compromise to the esophagus by the subcarinal mass.  There is no pneumothorax or pneumomediastinum.  CT ABDOMEN AND PELVIS FINDINGS  There is a large volume free intraperitoneal air. Most of the air is collected anteriorly over the liver and around the stomach. There also is a small volume of subcutaneous air in the left upper quadrant, above and below the percutaneous gastrostomy but not directly around it. On sagittal image 78, series 7 there are 2 air bubbles tracking alongside the percutaneous gastrostomy tube in the abdominal wall musculature.  No focal inflammatory changes are evident in the abdomen or pelvis. No focally inflamed or irregular bowel is evident to identified location of a perforated viscus. There are unremarkable appearances of the colon. The stomach is remarkable only for presence of the percutaneous gastrostomy. There is good contrast distention of the stomach and duodenum with no evidence of contrast extravasation. Small bowel is unremarkable.  There are normal appearances of the liver, gallbladder, bile ducts, pancreas, spleen, adrenals and kidneys.  The  abdominal aorta is normal in caliber with moderate atherosclerotic calcification.  No significant skeletal lesions are evident.  IMPRESSION: 1. Large volume free intraperitoneal air. No focal abnormality is evident to identify a perforated viscus. 2. There is subcutaneous air in the left upper quadrant and in the lower left chest. There also are 2 air bubbles tracking along the percutaneous gastrostomy tube within the anterior abdominal wall. This suggests the possibility of the percutaneous gastrostomy being the etiology of the free air, but it is not conclusive. The gastrostomy device itself appears to be satisfactorily positioned. 3. In the chest, the left mainstem bronchial stent appears to be satisfactorily positioned. There is new consolidation of left lower lobe basal segments. There is worsened esophageal dilatation with air-fluid level, probably due to compromise of the esophagus by the sub- carinal mass. There is slight interval enlargement of a moderate right pleural effusion.   Electronically Signed   By: Andreas Newport M.D.   On: 08/05/2014 21:11   Ct Abdomen Pelvis W Contrast  08/05/2014   CLINICAL DATA:  Left mainstem bronchus stent placed this morning. Was found to have free intraperitoneal air and was sent to the emergency room.  EXAM: CT CHEST, ABDOMEN, AND PELVIS WITH CONTRAST  TECHNIQUE: Multidetector CT imaging of the chest, abdomen and pelvis was performed following the standard protocol during bolus administration of intravenous contrast.  CONTRAST:  41m OMNIPAQUE IOHEXOL 300 MG/ML SOLN, 1034mOMNIPAQUE IOHEXOL 300 MG/ML SOLN  COMPARISON:  07/14/2014  FINDINGS: CT CHEST FINDINGS  The left mainstem bronchus stent appears well-positioned. Endo stent lumen is widely patent. There is no significant change in the peripherally enhancing centrally hypodense 5.5 cm sub- carinal mass. There is no significant change in the central right lower lobe peripherally enhancing 4.7 x 7.2 cm mass. There  is a moderate right pleural effusion which is marginally larger than on 07/14/2014. There is consolidation and air bronchograms in the posterior basal segment of the left lower lobe, new, and there is a lesser degree of consolidation and volume loss in the lateral basal segment. There is a small pericardial effusion,  unchanged. There is mild esophageal dilatation with an air-fluid level and this appears a little worsened. It may be due to compromise to the esophagus by the subcarinal mass.  There is no pneumothorax or pneumomediastinum.  CT ABDOMEN AND PELVIS FINDINGS  There is a large volume free intraperitoneal air. Most of the air is collected anteriorly over the liver and around the stomach. There also is a small volume of subcutaneous air in the left upper quadrant, above and below the percutaneous gastrostomy but not directly around it. On sagittal image 78, series 7 there are 2 air bubbles tracking alongside the percutaneous gastrostomy tube in the abdominal wall musculature.  No focal inflammatory changes are evident in the abdomen or pelvis. No focally inflamed or irregular bowel is evident to identified location of a perforated viscus. There are unremarkable appearances of the colon. The stomach is remarkable only for presence of the percutaneous gastrostomy. There is good contrast distention of the stomach and duodenum with no evidence of contrast extravasation. Small bowel is unremarkable.  There are normal appearances of the liver, gallbladder, bile ducts, pancreas, spleen, adrenals and kidneys.  The abdominal aorta is normal in caliber with moderate atherosclerotic calcification.  No significant skeletal lesions are evident.  IMPRESSION: 1. Large volume free intraperitoneal air. No focal abnormality is evident to identify a perforated viscus. 2. There is subcutaneous air in the left upper quadrant and in the lower left chest. There also are 2 air bubbles tracking along the percutaneous gastrostomy tube  within the anterior abdominal wall. This suggests the possibility of the percutaneous gastrostomy being the etiology of the free air, but it is not conclusive. The gastrostomy device itself appears to be satisfactorily positioned. 3. In the chest, the left mainstem bronchial stent appears to be satisfactorily positioned. There is new consolidation of left lower lobe basal segments. There is worsened esophageal dilatation with air-fluid level, probably due to compromise of the esophagus by the sub- carinal mass. There is slight interval enlargement of a moderate right pleural effusion.   Electronically Signed   By: Andreas Newport M.D.   On: 08/05/2014 21:11   Dg Abd Acute W/chest  08/05/2014   CLINICAL DATA:  Air in the abdomen. Patient had a stent placed in the left lung this morning. History of lung cancer.  EXAM: DG ABDOMEN ACUTE W/ 1V CHEST  COMPARISON:  July 24, 2014  FINDINGS: There is a small right pleural effusion enlarged compared to the prior exam. Prominence of right hilum is identified unchanged. The left lung is clear. The heart size is normal. Right central venous line is unchanged.  There is free air. Focal dilated bowel loop is noted in the lower abdomen/pelvis. Radiopaque needle like densities are projected over the abdomen. A PEG tube is identified.  IMPRESSION: Free air. Further evaluation with CT of abdomen pelvis is recommended.  These results will be called to the ordering clinician or representative by the Radiologist Assistant, and communication documented in the PACS or zVision Dashboard.   Electronically Signed   By: Abelardo Diesel M.D.   On: 08/05/2014 18:16     EKG Interpretation   Date/Time:  Thursday August 05 2014 18:08:57 EDT Ventricular Rate:  128 PR Interval:    QRS Duration: 89 QT Interval:  325 QTC Calculation: 474 R Axis:   9 Text Interpretation:  Atrial fibrillation Nonspecific T abnormalities,  lateral leads No significant change since last tracing Confirmed by    [place]  MD, [place] [phone]) on 08/05/2014  6:26:38 PM      MDM   Final diagnoses:  Free intraperitoneal air  Atrial fibrillation with RVR    Pt is a 74 y.o. male with Pmhx as above who presents with report of free subdiaphragmatic air seen on post-procedure XR after bronchial stending of R lised lung mass a [facility] today.  Patient states he's been having left upper quadrant pain with radiation to his chest for several months and that he has had a recent EGD which showed esophageal ulcers and erosive gastritis.  He states his pain is present today, though not particularly worse.  On exam, he is tachycardic in A. fib with rate in the 1 teens to 130s.  Blood pressure stable.  He is afebrile.  He has a G-tube in place, which is well-appearing, he has some upper abdominal tenderness.  Normal bowel sounds, no tympany, no rebound or guarding.  Stat acute abdominal series here also shows free air.  2 peripheral IVs in place.  Patient beginning IV fluid resuscitation given dose of IV Zofran, [facility] surgery will be consulted for further recommendations. Dr. Johney Maine will see pt, rec CT chest, A/P  8:25 PM Dr. Johney Maine has seen pt, agrees he is not septic appearing or peritonitic. Waiting for CT. prior did not improve with 2 IV boluses of diltiazem.  The patient was placed on diltiazem drip for rate control of A. fib  CT shows large free air.  No definitive area of perforated viscus, he has subcutaneous air in the left upper quadrant and in the left lower chest, also has to air bubbles tracking along the percutaneous gastrostomy tube within the anterior abdominal wall with suggest possibility of perked G-tube in etiology of free air.  This is not conclusive.  The device itself is satisfactorily positioned as is the mainstem bronchial stent.  Dr. gross aware of results, he feels would likely need IR to place larger G-tube tomorrow with recommend admission for further monitoring.  Spoke with critical care, who  given patient's well appearance and stabilized vital signs, believe patient go to go to stepdown.  [facility] will admit.       Ernestina Patches, MD 08/06/14 [phone]

## 2014-08-05 NOTE — ED Notes (Signed)
Pt sent to the ED by [facility], due to having "air in his abdomen."  Pt's wife reports that Pt had a stent placed into L lung this morning.  Denies n/v/d.  Denies abdominal or any new pain.  Hx of lung CA.  Last chemo 6-8 weeks ago.

## 2014-08-05 NOTE — H&P (Signed)
[facility] History and Physical  Steve [name] FGH:[id number] DOB: Aug 04, 1940 DOA: 08/05/2014  Referring physician: Dr Tawnya Crook PCP: [name], MD   Chief Complaint:  Refer to ED due to Abnormal X Ray.   HPI: Steve [name] is a 74 y.o. male With PMH significant for A fib, COPD, Diabetes, Non small lung cell cancer, Patient had bronchial stent placement at [facility] today 7-28, per report pulmonary fellow relates stent placement went well. Patient was on his way home from [facility] when  he received a call, that he had air in the abdomen and he was refer to the near  ED.  Patient also has been having left upper quadrant pain for months. He had endoscopy that showed esophageal ulcer and erosive gastritis. He has had G tube placed on July 19-2016. He denies worsening abdominal pain. Denies worsening dyspnea.  In the ED he was found to be in A fib RVR. He didn't take his medications today.  Dr Johney Maine with sx evaluated patient. He recommend Ct abdomen, pelvis and chest which showed: Large volume free intraperitoneal air. No focal abnormality is evident to identify a perforated viscus. There is subcutaneous air in the left upper quadrant and in the lower left chest. There also are 2 air bubbles tracking along the percutaneous gastrostomy tube within the anterior abdominal wall. this suggests the possibility of the percutaneous gastrostomy being the etiology of the free air, but it is not conclusive. The gastrostomy device itself appears to be satisfactorily positioned.  Review of Systems:  Negative, except as per HPI.   Past Medical History  Diagnosis Date  . Dizziness and giddiness     positional vertigo  . Hyperlipidemia   . Impotence of organic origin   . Osteoarthritis, shoulder   . Allergy   . Hypothyroidism     pt denies thryoid disease, dr pandey note 11-19-12 says subclinical hypothryoid in epic  . COPD (chronic obstructive pulmonary disease)     pt denies  . Insomnia   . Hx of  radiation therapy 02/2013    right lung  . Diabetes mellitus without complication     pt denies diabetes, dr pandey note says dm 11-19-12 epic  . 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 Malignant neoplasm of bladder, part unspecified   . Malignant neoplasm of bronchus and lung, unspecified site   . PAF (paroxysmal atrial fibrillation)     a. not on anticoag due to oncologic issues, on flecainide.  . Paroxysmal SVT (supraventricular tachycardia)   . Chronic respiratory failure   . Esophageal stricture   . Esophagitis   . Reactive airway disease   . Acute URI 07/29/2012  . Syncope 12/21/2013    Jan 2016 with documented orthostatic B/P and rapid PSVT on holter   . Ethmoid sinusitis 03/10/2014  . Orthostatic hypotension-on Midodrine 07/15/2014   Past Surgical History  Procedure Laterality Date  . Cystoscopy w/ dilation of bladder    . Tonsillectomy  age 63  . Endobronchial ultrasound Bilateral 12/15/2012    Procedure: ENDOBRONCHIAL ULTRASOUND;  Surgeon: Brand Males, MD;  Location: [facility];  Service: Cardiopulmonary;  Laterality: Bilateral;  . Colonoscopy  2009    [facility]   Social History:  reports that he quit smoking about 7 years ago. His smoking use included Cigarettes. He has a 75 pack-year smoking history. He quit smokeless tobacco use about 2 months ago. His smokeless tobacco use included Chew. He reports that he does  not drink alcohol or use illicit drugs.  Allergies  Allergen Reactions  . Morphine Hives  . Morphine And Related Hives    Family History  Problem Relation Age of Onset  . Kidney disease Brother   . Hypertension Mother   . Diabetes Mother   . Cancer Father     type unknown  . Stroke Father     Prior to Admission medications   Medication Sig Start Date End Date Taking? Authorizing Provider  albuterol (PROVENTIL) (5 MG/ML) 0.5% nebulizer solution Inhale 0.5 mLs into the lungs 2 (two) times daily. 08/05/14 11/03/14 Yes Historical  Provider, MD  aspirin 81 MG tablet Place 1 tablet (81 mg total) into feeding tube daily. 07/29/14  Yes Velvet Bathe, MD  diltiazem (CARDIZEM) 90 MG tablet Take 1 tablet (90 mg total) by mouth every 6 (six) hours. 07/30/14  Yes Fay Records, MD  flecainide (TAMBOCOR) 100 MG tablet Place 1 tablet (100 mg total) into feeding tube 2 (two) times daily. 07/30/14  Yes Velvet Bathe, MD  guaiFENesin (MUCINEX) 600 MG 12 hr tablet Take 600 mg by mouth every 12 (twelve) hours as needed for cough.   Yes Historical Provider, MD  lactose free nutrition (BOOST PLUS) LIQD Take 237 mLs by mouth 2 (two) times daily between meals. 07/29/14  Yes Velvet Bathe, MD  lidocaine (XYLOCAINE) 2 % solution Use as directed 20 mLs in the mouth or throat every 6 (six) hours as needed for mouth pain. 06/22/14  Yes Maryanna Shape, NP  Nutritional Supplements (FEEDING SUPPLEMENT, OSMOLITE 1.5 CAL,) LIQD Place 237 mLs into feeding tube 4 (four) times daily. 07/29/14  Yes Velvet Bathe, MD  omeprazole (PRILOSEC) 40 MG capsule Take 40 mg by mouth 2 (two) times daily.  07/22/14  Yes Historical Provider, MD  oxyCODONE (ROXICODONE) 5 MG/5ML solution Take 5 mLs (5 mg total) by mouth every 4 (four) hours as needed for moderate pain. 07/29/14  Yes Velvet Bathe, MD  polyethylene glycol (MIRALAX / GLYCOLAX) packet Take 17 g by mouth daily as needed for mild constipation or moderate constipation.    Yes Historical Provider, MD  simvastatin (ZOCOR) 20 MG tablet Place 1 tablet (20 mg total) into feeding tube daily. Take one tablet by mouth once daily to lower cholesterol 07/29/14  Yes Velvet Bathe, MD  sodium chloride HYPERTONIC 3 % nebulizer solution Inhale 3 mLs into the lungs 2 (two) times daily. 08/05/14 11/03/14 Yes Historical Provider, MD  temazepam (RESTORIL) 30 MG capsule Place 1 capsule (30 mg total) into feeding tube at bedtime as needed for sleep. 07/29/14  Yes Velvet Bathe, MD  Water For Irrigation, Sterile (FREE WATER) SOLN Place 120 mLs into  feeding tube 4 (four) times daily. 07/29/14  Yes Velvet Bathe, MD  Amino Acids-Protein Hydrolys (FEEDING SUPPLEMENT, PRO-STAT SUGAR FREE 64,) LIQD Place 30 mLs into feeding tube 2 (two) times daily. Patient not taking: Reported on 08/05/2014 07/29/14   Velvet Bathe, MD  OxyCODONE (OXYCONTIN) 15 mg T12A 12 hr tablet Take 1 tablet (15 mg total) by mouth at bedtime. Patient not taking: Reported on 08/05/2014 07/29/14   Velvet Bathe, MD  pantoprazole sodium (PROTONIX) 40 mg/20 mL PACK Place 40 mLs (80 mg total) into feeding tube daily. Patient not taking: Reported on 08/05/2014 07/17/14   Hosie Poisson, MD   Physical Exam: Filed Vitals:   08/05/14 2100 08/05/14 2130 08/05/14 2200 08/05/14 2224  BP: 102/70 105/71 100/68 101/73  Pulse: 117 79 101 101  Temp:  TempSrc:      Resp: '15 17 16 18  '$ SpO2: 100% 100% 100% 100%    Wt Readings from Last 3 Encounters:  08/02/14 73.936 kg (163 lb)  07/29/14 72.7 kg (160 lb 4.4 oz)  07/16/14 78.5 kg (173 lb 1 oz)    General:  Appears calm and comfortable Eyes: PERRL, normal lids, irises & conjunctiva ENT: grossly normal hearing, lips & tongue Neck: no LAD, masses or thyromegaly Cardiovascular: RRR, no m/r/g. No LE edema. Respiratory: Crackles left side. Normal respiratory effort. Bilateral air movement.  Abdomen: soft, ntnd, G tube in place with yellowish drainage  Skin: no rash or induration seen on limited exam Musculoskeletal: grossly normal tone BUE/BLE Psychiatric: grossly normal mood and affect, speech fluent and appropriate Neurologic: grossly non-focal.          Labs on Admission:  Basic Metabolic Panel:  Recent Labs Lab 08/05/14 1825  NA 133*  K 3.6  CL 94*  CO2 29  GLUCOSE 168*  BUN 10  CREATININE 0.57*  CALCIUM 9.5   Liver Function Tests:  Recent Labs Lab 08/05/14 1825  AST 24  ALT 38  ALKPHOS 97  BILITOT 0.4  PROT 6.7  ALBUMIN 2.5*   No results for input(s): LIPASE, AMYLASE in the last 168 hours. No results for  input(s): AMMONIA in the last 168 hours. CBC:  Recent Labs Lab 08/05/14 1825  WBC 12.1*  NEUTROABS 11.0*  HGB 10.4*  HCT 32.9*  MCV 90.4  PLT 613*   Cardiac Enzymes: No results for input(s): CKTOTAL, CKMB, CKMBINDEX, TROPONINI in the last 168 hours.  BNP (last 3 results)  Recent Labs  07/14/14 1916  BNP 333.5*    ProBNP (last 3 results) No results for input(s): PROBNP in the last 8760 hours.  CBG: No results for input(s): GLUCAP in the last 168 hours.  Radiological Exams on Admission: Ct Chest W Contrast  08/05/2014   CLINICAL DATA:  Left mainstem bronchus stent placed this morning. Was found to have free intraperitoneal air and was sent to the emergency room.  EXAM: CT CHEST, ABDOMEN, AND PELVIS WITH CONTRAST  TECHNIQUE: Multidetector CT imaging of the chest, abdomen and pelvis was performed following the standard protocol during bolus administration of intravenous contrast.  CONTRAST:  72m OMNIPAQUE IOHEXOL 300 MG/ML SOLN, 1066mOMNIPAQUE IOHEXOL 300 MG/ML SOLN  COMPARISON:  07/14/2014  FINDINGS: CT CHEST FINDINGS  The left mainstem bronchus stent appears well-positioned. Endo stent lumen is widely patent. There is no significant change in the peripherally enhancing centrally hypodense 5.5 cm sub- carinal mass. There is no significant change in the central right lower lobe peripherally enhancing 4.7 x 7.2 cm mass. There is a moderate right pleural effusion which is marginally larger than on 07/14/2014. There is consolidation and air bronchograms in the posterior basal segment of the left lower lobe, new, and there is a lesser degree of consolidation and volume loss in the lateral basal segment. There is a small pericardial effusion, unchanged. There is mild esophageal dilatation with an air-fluid level and this appears a little worsened. It may be due to compromise to the esophagus by the subcarinal mass.  There is no pneumothorax or pneumomediastinum.  CT ABDOMEN AND PELVIS  FINDINGS  There is a large volume free intraperitoneal air. Most of the air is collected anteriorly over the liver and around the stomach. There also is a small volume of subcutaneous air in the left upper quadrant, above and below the percutaneous gastrostomy but not directly  around it. On sagittal image 78, series 7 there are 2 air bubbles tracking alongside the percutaneous gastrostomy tube in the abdominal wall musculature.  No focal inflammatory changes are evident in the abdomen or pelvis. No focally inflamed or irregular bowel is evident to identified location of a perforated viscus. There are unremarkable appearances of the colon. The stomach is remarkable only for presence of the percutaneous gastrostomy. There is good contrast distention of the stomach and duodenum with no evidence of contrast extravasation. Small bowel is unremarkable.  There are normal appearances of the liver, gallbladder, bile ducts, pancreas, spleen, adrenals and kidneys.  The abdominal aorta is normal in caliber with moderate atherosclerotic calcification.  No significant skeletal lesions are evident.  IMPRESSION: 1. Large volume free intraperitoneal air. No focal abnormality is evident to identify a perforated viscus. 2. There is subcutaneous air in the left upper quadrant and in the lower left chest. There also are 2 air bubbles tracking along the percutaneous gastrostomy tube within the anterior abdominal wall. This suggests the possibility of the percutaneous gastrostomy being the etiology of the free air, but it is not conclusive. The gastrostomy device itself appears to be satisfactorily positioned. 3. In the chest, the left mainstem bronchial stent appears to be satisfactorily positioned. There is new consolidation of left lower lobe basal segments. There is worsened esophageal dilatation with air-fluid level, probably due to compromise of the esophagus by the sub- carinal mass. There is slight interval enlargement of a  moderate right pleural effusion.   Electronically Signed   By: Andreas Newport M.D.   On: 08/05/2014 21:11   Ct Abdomen Pelvis W Contrast  08/05/2014   CLINICAL DATA:  Left mainstem bronchus stent placed this morning. Was found to have free intraperitoneal air and was sent to the emergency room.  EXAM: CT CHEST, ABDOMEN, AND PELVIS WITH CONTRAST  TECHNIQUE: Multidetector CT imaging of the chest, abdomen and pelvis was performed following the standard protocol during bolus administration of intravenous contrast.  CONTRAST:  56m OMNIPAQUE IOHEXOL 300 MG/ML SOLN, 1073mOMNIPAQUE IOHEXOL 300 MG/ML SOLN  COMPARISON:  07/14/2014  FINDINGS: CT CHEST FINDINGS  The left mainstem bronchus stent appears well-positioned. Endo stent lumen is widely patent. There is no significant change in the peripherally enhancing centrally hypodense 5.5 cm sub- carinal mass. There is no significant change in the central right lower lobe peripherally enhancing 4.7 x 7.2 cm mass. There is a moderate right pleural effusion which is marginally larger than on 07/14/2014. There is consolidation and air bronchograms in the posterior basal segment of the left lower lobe, new, and there is a lesser degree of consolidation and volume loss in the lateral basal segment. There is a small pericardial effusion, unchanged. There is mild esophageal dilatation with an air-fluid level and this appears a little worsened. It may be due to compromise to the esophagus by the subcarinal mass.  There is no pneumothorax or pneumomediastinum.  CT ABDOMEN AND PELVIS FINDINGS  There is a large volume free intraperitoneal air. Most of the air is collected anteriorly over the liver and around the stomach. There also is a small volume of subcutaneous air in the left upper quadrant, above and below the percutaneous gastrostomy but not directly around it. On sagittal image 78, series 7 there are 2 air bubbles tracking alongside the percutaneous gastrostomy tube in the  abdominal wall musculature.  No focal inflammatory changes are evident in the abdomen or pelvis. No focally inflamed or irregular bowel is  evident to identified location of a perforated viscus. There are unremarkable appearances of the colon. The stomach is remarkable only for presence of the percutaneous gastrostomy. There is good contrast distention of the stomach and duodenum with no evidence of contrast extravasation. Small bowel is unremarkable.  There are normal appearances of the liver, gallbladder, bile ducts, pancreas, spleen, adrenals and kidneys.  The abdominal aorta is normal in caliber with moderate atherosclerotic calcification.  No significant skeletal lesions are evident.  IMPRESSION: 1. Large volume free intraperitoneal air. No focal abnormality is evident to identify a perforated viscus. 2. There is subcutaneous air in the left upper quadrant and in the lower left chest. There also are 2 air bubbles tracking along the percutaneous gastrostomy tube within the anterior abdominal wall. This suggests the possibility of the percutaneous gastrostomy being the etiology of the free air, but it is not conclusive. The gastrostomy device itself appears to be satisfactorily positioned. 3. In the chest, the left mainstem bronchial stent appears to be satisfactorily positioned. There is new consolidation of left lower lobe basal segments. There is worsened esophageal dilatation with air-fluid level, probably due to compromise of the esophagus by the sub- carinal mass. There is slight interval enlargement of a moderate right pleural effusion.   Electronically Signed   By: Andreas Newport M.D.   On: 08/05/2014 21:11   Dg Abd Acute W/chest  08/05/2014   CLINICAL DATA:  Air in the abdomen. Patient had a stent placed in the left lung this morning. History of lung cancer.  EXAM: DG ABDOMEN ACUTE W/ 1V CHEST  COMPARISON:  July 24, 2014  FINDINGS: There is a small right pleural effusion enlarged compared to the  prior exam. Prominence of right hilum is identified unchanged. The left lung is clear. The heart size is normal. Right central venous line is unchanged.  There is free air. Focal dilated bowel loop is noted in the lower abdomen/pelvis. Radiopaque needle like densities are projected over the abdomen. A PEG tube is identified.  IMPRESSION: Free air. Further evaluation with CT of abdomen pelvis is recommended.  These results will be called to the ordering clinician or representative by the Radiologist Assistant, and communication documented in the PACS or zVision Dashboard.   Electronically Signed   By: Abelardo Diesel M.D.   On: 08/05/2014 18:16    EKG: Independently reviewed. A fib RVR.   Assessment/Plan Principal Problem:   Pneumoperitoneum of unknown etiology Active Problems:   Primary cancer of right lower lobe of lung   PAF- on Flecainide,    Wheezing   Pleural effusion, right   Dysphagia, pharyngoesophageal phase   Dyspnea   Malnutrition of moderate degree   Erosive esophagitis on recent endoscopy   Atrial fibrillation with rapid ventricular response   Odynophagia   Esophageal stricture   Gastrostomy in place  1-Pneumoperitoneo:  Dr Johney Maine suspect this is related to Peg Tube leak.  Admit to step down unit.  Surgery following.  IV zosyn, NPO status.   2-A fib RVR; continue with flecainide,ok to use per G tube,discussed with Dr Johney Maine.  Continue with IV Cardizem.   3-Lung Cancer; S/P stent bronchus at [facility] today; patient appears to be stable.  Nebulizer treatments.   4-Esophageal stricture, ulcer: IV protonix. Start nutrition when ok by surgery.    Code Status:  DVT Prophylaxis: SCD.  Family Communication; care discussed with patient.  Disposition Plan: Expect 3 to 4 days inpatient.   Time spent: 75 minutes.   Carroll Lingelbach,  [facility] Pager [phone]

## 2014-08-05 NOTE — ED Notes (Addendum)
Pt placed on 2L 0x. Trouble breathing and stated he would like to have it. Level at 97% on 2L

## 2014-08-05 NOTE — Consult Note (Signed)
[name] [place], [place] [address] Phone: [phone] FAX: [phone]     AKARI CRYSLER  12-Sep-1940 [id number]  CARE TEAM:  PCP: [name], MD  Outpatient Care Team: Patient Care Team: [name], MD as PCP - General (Internal Medicine) Curt Bears, MD as Consulting Physician (Oncology) Lafayette Dragon, MD as Consulting Physician (Gastroenterology) [facility] Elie Confer, MD as Consulting Physician (Pulmonary Disease) Fay Records, MD as Consulting Physician (Cardiology)  Inpatient Treatment Team: Treatment Team: Attending Provider: Ernestina Patches, MD; Technician: Chrisandra Carota, NT; Consulting Physician: Nolon Nations, MD; Registered Nurse: Hollace Hayward, RN  This patient is a 74 y.o.male who presents today for surgical evaluation at the request of Ernestina Patches, Dirk Dress ED MD.   Reason for evaluation: Pneumoperitoneum  74 year old male diagnosed with non-small cell lung cancer signify-pack-year history of tobacco in past.  Has been on palliative radiation chemotherapy for some time.  Followed by Dr. Earlie Server.  Was admitted for worsening odynophagia and dysphagia.  Found to have mid-esophageal impingement and esophageal ulceration most likely from extrinsic compression from tumor and lymphadenopathy.  Gastric body looked fine.  There is been discussion of gastritis or gastric ulcers and there is none noted on the EGD nor in their assessment.  Not felt to be amenable to esophageal stenting.  Because he had dysphasia and could not only tolerate small liquids and had poor appetite, it was felt he would benefit from better enteral nutrition.  Therefore he had interventional radiology place a 20Fr percutaneous gastrostomy tube 9 days ago, 19 July, 2016.    Apparently he underwent stenting of his right bronchus at [facility] earlier today.   I can see the H&P but cannot find any procedure note.  Chest x-ray was  done there.  Patient was allowed to go home.  Once they were almost in [place], they received a call that he had free air on x-ray and was recommended to go to the emergency room immediately.  He came to [facility] emergency room.  Wife at bedside.  X-rays here confirmed free air under both diaphragms.  Surgical consultation requested  Patient notes he has had upper abdominal pain for several months.  Nothing particularly worse today.  Been tolerating 4 cans of tube feeds a day.  Some leakage around the tube so.  Changing the dressing twice a day.  Not severe.   He still has severe dysphasia but is tolerating his baseline of sipping water and thin liquids.  No major change.  No retching.  No nausea or vomiting.  No severe pain with swallowing.  He denies any major abdominal pain.  No pain with the car ride or coughing or turning.  His issues with atrial fibrillation with occasional rapid ventricular response.  On aspirin only.  Not more aggressively anticoagulated given his cancer.  Heart rate is been 110 to 120s in the emergency room.  He is not been able to get his medications today though.  Receiving diltiazem in the emergency room.  Past Medical History  Diagnosis Date  . Dizziness and giddiness     positional vertigo  . Hyperlipidemia   . Impotence of organic origin   . Osteoarthritis, shoulder   . Allergy   . Hypothyroidism     pt denies thryoid disease, dr pandey note 11-19-12 says subclinical hypothryoid in epic  . COPD (chronic obstructive pulmonary disease)     pt denies  .  Insomnia   . Hx of radiation therapy 02/2013    right lung  . Diabetes mellitus without complication     pt denies diabetes, dr pandey note says dm 11-19-12 epic  . 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 Malignant neoplasm of bladder, part unspecified   . Malignant neoplasm of bronchus and lung, unspecified site   . PAF (paroxysmal atrial fibrillation)     a. not on anticoag due to oncologic  issues, on flecainide.  . Paroxysmal SVT (supraventricular tachycardia)   . Chronic respiratory failure   . Esophageal stricture   . Esophagitis   . Reactive airway disease     Past Surgical History  Procedure Laterality Date  . Cystoscopy w/ dilation of bladder    . Tonsillectomy  age 76  . Endobronchial ultrasound Bilateral 12/15/2012    Procedure: ENDOBRONCHIAL ULTRASOUND;  Surgeon: Brand Males, MD;  Location: [facility] ENDOSCOPY;  Service: Cardiopulmonary;  Laterality: Bilateral;  . Colonoscopy  2009    [facility]    History   Social History  . Marital Status: Married    Spouse Name: N/A  . Number of Children: 3  . Years of Education: N/A   Occupational History  . business owner    Social History Main Topics  . Smoking status: Former Smoker -- 1.50 packs/day for 50 years    Types: Cigarettes    Quit date: 01/09/2007  . Smokeless tobacco: Former Systems developer    Types: Chew    Quit date: 05/28/2014  . Alcohol Use: No  . Drug Use: No  . Sexual Activity: Not on file   Other Topics Concern  . Not on file   Social History Narrative    Family History  Problem Relation Age of Onset  . Kidney disease Brother   . Hypertension Mother   . Diabetes Mother   . Cancer Father     type unknown  . Stroke Father     Current Facility-Administered Medications  Medication Dose Route Frequency Provider Last Rate Last Dose  . diltiazem (CARDIZEM) injection 10 mg  10 mg Intravenous Once Ernestina Patches, MD       Current Outpatient Prescriptions  Medication Sig Dispense Refill  . albuterol (PROVENTIL) (5 MG/ML) 0.5% nebulizer solution Inhale 0.5 mLs into the lungs 2 (two) times daily.    Marland Kitchen aspirin 81 MG tablet Place 1 tablet (81 mg total) into feeding tube daily. 30 tablet 0  . diltiazem (CARDIZEM) 90 MG tablet Take 1 tablet (90 mg total) by mouth every 6 (six) hours. 360 tablet 4  . flecainide (TAMBOCOR) 100 MG tablet Place 1 tablet (100 mg total) into feeding tube 2 (two) times daily.  30 tablet 0  . guaiFENesin (MUCINEX) 600 MG 12 hr tablet Take 600 mg by mouth every 12 (twelve) hours as needed for cough.    . lactose free nutrition (BOOST PLUS) LIQD Take 237 mLs by mouth 2 (two) times daily between meals.  0  . lidocaine (XYLOCAINE) 2 % solution Use as directed 20 mLs in the mouth or throat every 6 (six) hours as needed for mouth pain. 480 mL 1  . Nutritional Supplements (FEEDING SUPPLEMENT, OSMOLITE 1.5 CAL,) LIQD Place 237 mLs into feeding tube 4 (four) times daily. 90 Bottle 0  . omeprazole (PRILOSEC) 40 MG capsule Take 40 mg by mouth 2 (two) times daily.   4  . oxyCODONE (ROXICODONE) 5 MG/5ML solution Take 5 mLs (5 mg total) by mouth every  4 (four) hours as needed for moderate pain. 473 mL 0  . polyethylene glycol (MIRALAX / GLYCOLAX) packet Take 17 g by mouth daily as needed for mild constipation or moderate constipation.     . simvastatin (ZOCOR) 20 MG tablet Place 1 tablet (20 mg total) into feeding tube daily. Take one tablet by mouth once daily to lower cholesterol 90 tablet 3  . sodium chloride HYPERTONIC 3 % nebulizer solution Inhale 3 mLs into the lungs 2 (two) times daily.    . temazepam (RESTORIL) 30 MG capsule Place 1 capsule (30 mg total) into feeding tube at bedtime as needed for sleep. 30 capsule 0  . Water For Irrigation, Sterile (FREE WATER) SOLN Place 120 mLs into feeding tube 4 (four) times daily.    . Amino Acids-Protein Hydrolys (FEEDING SUPPLEMENT, PRO-STAT SUGAR FREE 64,) LIQD Place 30 mLs into feeding tube 2 (two) times daily. (Patient not taking: Reported on 08/05/2014) 900 mL 0  . OxyCODONE (OXYCONTIN) 15 mg T12A 12 hr tablet Take 1 tablet (15 mg total) by mouth at bedtime. (Patient not taking: Reported on 08/05/2014) 30 tablet 0  . pantoprazole sodium (PROTONIX) 40 mg/20 mL PACK Place 40 mLs (80 mg total) into feeding tube daily. (Patient not taking: Reported on 08/05/2014) 30 each 1   Facility-Administered Medications Ordered in Other Encounters   Medication Dose Route Frequency Provider Last Rate Last Dose  . sodium chloride 0.9 % injection 10 mL  10 mL Intravenous PRN Curt Bears, MD   10 mL at 06/08/14 1216     Allergies  Allergen Reactions  . Morphine Hives  . Morphine And Related Hives    ROS: Constitutional:  No fevers, chills, sweats.  Weight stable Eyes:  No vision changes, No discharge HENT:  No sore throats, nasal drainage Lymph: No neck swelling, No bruising easily Pulmonary:  + cough, + productive sputum CV: No orthopnea, PND.  DOE.  No exertional chest/neck/shoulder/arm pain. GI:  No personal nor family history of GI/colon cancer, inflammatory bowel disease, irritable bowel syndrome, allergy such as Celiac Sprue, dietary/dairy problems, colitis, ulcers nor gastritis.  No recent sick contacts/gastroenteritis.  No travel outside the country.  No changes in diet.  Intermittent constipation and loose stools.  On chronic narcotics and Miralax/Dulcolax Renal: No UTIs, No hematuria Genital:  No drainage, bleeding, masses Musculoskeletal: No severe joint pain.  Good ROM major joints Skin:  No sores or lesions.  No rashes Heme/Lymph:  No easy bleeding.  No swollen lymph nodes Neuro: No focal weakness/numbness.  No seizures Psych: No suicidal ideation.  No hallucinations  BP 107/67 mmHg  Pulse 129  Temp(Src) 97.7 F (36.5 C) (Oral)  Resp 16  SpO2 96%  Physical Exam: General: Pt awake/alert/oriented x4 in no major acute distress.  Common relaxed.  Moving to sitting position quite easily. Eyes: PERRL, normal EOM. Sclera nonicteric Neuro: CN II-XII intact w/o focal sensory/motor deficits. Lymph: No head/neck/groin lymphadenopathy Psych:  No delerium/psychosis/paranoia HENT: Normocephalic, Mucus membranes moist.  No thrush Neck: Supple, No tracheal deviation Chest: No pain.  Good respiratory excursion.  Some wheezing and crackles a special on the right side.  Productive cough. CV:  Pulses intact.  Regular he  irregular rhythm.  Heart rate 105-125  Abdomen: Soft, Nondistended.  Nontender.  No incarcerated hernias.  Left upper quadrant gastrostomy tube in place.  Mild erythema and mucous drainage.  No severe cellulitis.  No abscess.  No guarding.  No rebound tenderness.  No pain with cough bed shake  or percussion. Ext:  SCDs BLE.  No significant edema.  No cyanosis Skin: No petechiae / purpurea.  No major sores Musculoskeletal: No severe joint pain.  Good ROM major joints   Results:   Labs: Results for orders placed or performed during the hospital encounter of 08/05/14 (from the past 48 hour(s))  Type and screen     Status: None   Collection Time: 08/05/14  6:16 PM  Result Value Ref Range   ABO/RH(D) O NEG    Antibody Screen NEG    Sample Expiration 08/08/2014   CBC with Differential/Platelet     Status: Abnormal   Collection Time: 08/05/14  6:25 PM  Result Value Ref Range   WBC 12.1 (H) 4.0 - 10.5 K/uL   RBC 3.64 (L) 4.22 - 5.81 MIL/uL   Hemoglobin 10.4 (L) 13.0 - 17.0 g/dL   HCT 32.9 (L) 39.0 - 52.0 %   MCV 90.4 78.0 - 100.0 fL   MCH 28.6 26.0 - 34.0 pg   MCHC 31.6 30.0 - 36.0 g/dL   RDW 16.2 (H) 11.5 - 15.5 %   Platelets 613 (H) 150 - 400 K/uL   Neutrophils Relative % 90 (H) 43 - 77 %   Neutro Abs 11.0 (H) 1.7 - 7.7 K/uL   Lymphocytes Relative 8 (L) 12 - 46 %   Lymphs Abs 0.9 0.7 - 4.0 K/uL   Monocytes Relative 2 (L) 3 - 12 %   Monocytes Absolute 0.2 0.1 - 1.0 K/uL   Eosinophils Relative 0 0 - 5 %   Eosinophils Absolute 0.0 0.0 - 0.7 K/uL   Basophils Relative 0 0 - 1 %   Basophils Absolute 0.0 0.0 - 0.1 K/uL  Comprehensive metabolic panel     Status: Abnormal   Collection Time: 08/05/14  6:25 PM  Result Value Ref Range   Sodium 133 (L) 135 - 145 mmol/L   Potassium 3.6 3.5 - 5.1 mmol/L   Chloride 94 (L) 101 - 111 mmol/L   CO2 29 22 - 32 mmol/L   Glucose, Bld 168 (H) 65 - 99 mg/dL   BUN 10 6 - 20 mg/dL   Creatinine, Ser 0.57 (L) 0.61 - 1.24 mg/dL   Calcium 9.5 8.9 - 10.3  mg/dL   Total Protein 6.7 6.5 - 8.1 g/dL   Albumin 2.5 (L) 3.5 - 5.0 g/dL   AST 24 15 - 41 U/L   ALT 38 17 - 63 U/L   Alkaline Phosphatase 97 38 - 126 U/L   Total Bilirubin 0.4 0.3 - 1.2 mg/dL   GFR calc non Af Amer >60 >60 mL/min   GFR calc Af Amer >60 >60 mL/min    Comment: (NOTE) The eGFR has been calculated using the CKD EPI equation. This calculation has not been validated in all clinical situations. eGFR's persistently <60 mL/min signify possible Chronic Kidney Disease.    Anion gap 10 5 - 15  I-stat troponin, ED     Status: None   Collection Time: 08/05/14  6:28 PM  Result Value Ref Range   Troponin i, poc 0.01 0.00 - 0.08 ng/mL   Comment 3            Comment: Due to the release kinetics of cTnI, a negative result within the first hours of the onset of symptoms does not rule out myocardial infarction with certainty. If myocardial infarction is still suspected, repeat the test at appropriate intervals.   I-Stat CG4 Lactic Acid, ED     Status:  None   Collection Time: 08/05/14  6:31 PM  Result Value Ref Range   Lactic Acid, Venous 1.36 0.5 - 2.0 mmol/L    Imaging / Studies: Dg Chest 2 View  07/24/2014   CLINICAL DATA:  Shortness of breath and difficulty swallowing.  EXAM: CHEST  2 VIEW  COMPARISON:  07/14/2014  FINDINGS: Right chest wall port a catheter is noted with tip in the cavoatrial junction. The heart size appears normal. Right pleural effusion is again identified. No left effusion. Right lower lobe lung mass is stable from previous exam.  IMPRESSION: 1. No acute findings and no significant change from 07/14/2014   Electronically Signed   By: Kerby Moors M.D.   On: 07/24/2014 16:01   Ct Angio Chest Pe W/cm &/or Wo Cm  07/14/2014   CLINICAL DATA:  74 year old male with history of lung cancer and COPD complaining of shortness of breath.  EXAM: CT ANGIOGRAPHY CHEST WITH CONTRAST  TECHNIQUE: Multidetector CT imaging of the chest was performed using the standard  protocol during bolus administration of intravenous contrast. Multiplanar CT image reconstructions and MIPs were obtained to evaluate the vascular anatomy.  CONTRAST:  176mL OMNIPAQUE IOHEXOL 350 MG/ML SOLN  COMPARISON:  Chest radiograph dated 07/14/2014 and CT dated 05/14/2014.  FINDINGS: Mild emphysematous changes. There is stable appearing 7.0 x 4.5 cm right lower lobe mass with encasement of the right middle and right lower lobe bronchi and occlusion of the right lower lobe bronchus. There is associated postobstructive atelectasis versus pneumonia. Stable moderate right pleural effusion again noted. There is apparent extension of the mass into the right hilar and subcarinal region.  Mild atherosclerotic calcification of the aorta. No CT evidence of pulmonary embolism. There is encasement and complete occlusion of the right lower lobe pulmonary artery branch by the lung mass. No cardiomegaly. There is coronary vascular calcification. Small pericardial effusion.  Right pectoral Port-A-Cath with tip in central SVC. Partially visualized subcentimeter left hepatic hypodense lesion, incompletely characterized. Degenerative changes of the spine. No acute fracture.  Review of the MIP images confirms the above findings.  IMPRESSION: No CT evidence of pulmonary embolism.  Stable appearing right lower lobe mass with encasement of the right lower lobe pulmonary arteries branch as well as right lower lobe bronchus. There is postobstructive atelectasis/ pneumonia.  Grossly stable right pleural effusion.  Scratch   Electronically Signed   By: Anner Crete M.D.   On: 07/14/2014 22:45   Ir Gastrostomy Tube Mod Sed  07/27/2014   CLINICAL DATA:  74 year old male with a history of dysphagia/strictures. He has been referred for percutaneous gastrostomy placement.  EXAM: PERCUTANEOUS GASTROSTOMY  FLUOROSCOPY TIME:  2 minutes 30 seconds minutes  MEDICATIONS AND MEDICAL HISTORY: Versed 2.5 mg, Fentanyl 125 mcg.  2 g Ancef   ANESTHESIA/SEDATION: Moderate sedation time: 27 minutes  CONTRAST:  25 cc Omni 300 enteric  PROCEDURE: The procedure, risks, benefits, and alternatives were explained to the patient. Questions regarding the procedure were encouraged and answered. The patient understands and consents to the procedure.  The epigastrium was prepped with Betadine in a sterile fashion, and a sterile drape was applied covering the operative field. A sterile gown and sterile gloves were used for the procedure.  A 5-French orogastric tube is placed under fluoroscopic guidance. Scout imaging of the abdomen confirms barium within the transverse colon.  The stomach was distended with gas. Under fluoroscopic guidance, an 18 gauge needle was utilized to puncture the anterior wall of the body of the  stomach. An Amplatz wire was advanced through the needle passing a T fastener into the lumen of the stomach. The T fastener was secured for gastropexy. A second T fastener was deployed using an 18 gauge needle adjacent to the first deployment.  Serial dilation was performed over the Amplatz wire at the location of the first puncture, with placement of a 20 French peel-away sheath. An 18 gauge balloon retention gastrostomy tube was then placed through the peel-away sheath over the wire. Once we confirmed position, the wire and peel-away sheath were removed. Contrast infused through the tube confirmed position and AP and lateral position.  The patient tolerated the procedure well and remained hemodynamically stable throughout.  No complications were encountered and no significant blood loss was encountered.  COMPLICATIONS: None  FINDINGS: The image demonstrates placement of a 20-French balloon retention gastrostomy tube into the body of the stomach.  IMPRESSION: Status post placement of 20 French balloon retention gastrostomy.  Signed,  Dulcy Fanny. Earleen Newport DO  [facility]  [facility]  PLAN: Decompression for  24 hours on low wall suction.  The tube may be used starting after 24 hours.   Electronically Signed   By: Corrie Mckusick D.O.   On: 07/27/2014 17:49   Dg Chest Port 1 View  07/14/2014   CLINICAL DATA:  Worsening shortness of breath and chest pain. Currently on chemotherapy for right lower lobe lung carcinoma.  EXAM: PORTABLE CHEST - 1 VIEW  COMPARISON:  06/10/2014  FINDINGS: Right-sided power port remains in appropriate position. Right infrahilar mass like opacity shows no significant change allowing for differences in positioning. Pleural- parenchymal scarring at right lung base appears stable. No new areas of pulmonary opacity are identified. No evidence of pneumothorax or pleural effusion. Heart size is within normal limits.  IMPRESSION: No significant change in right infrahilar masslike opacity and right basilar scarring.   Electronically Signed   By: Earle Gell M.D.   On: 07/14/2014 19:33   Dg Abd Acute W/chest  08/05/2014   CLINICAL DATA:  Air in the abdomen. Patient had a stent placed in the left lung this morning. History of lung cancer.  EXAM: DG ABDOMEN ACUTE W/ 1V CHEST  COMPARISON:  July 24, 2014  FINDINGS: There is a small right pleural effusion enlarged compared to the prior exam. Prominence of right hilum is identified unchanged. The left lung is clear. The heart size is normal. Right central venous line is unchanged.  There is free air. Focal dilated bowel loop is noted in the lower abdomen/pelvis. Radiopaque needle like densities are projected over the abdomen. A PEG tube is identified.  IMPRESSION: Free air. Further evaluation with CT of abdomen pelvis is recommended.  These results will be called to the ordering clinician or representative by the Radiologist Assistant, and communication documented in the PACS or zVision Dashboard.   Electronically Signed   By: Abelardo Diesel M.D.   On: 08/05/2014 18:16    Medications / Allergies: per chart  Antibiotics: Anti-infectives    Start      Dose/Rate Route Frequency Ordered Stop   08/05/14 1830  piperacillin-tazobactam (ZOSYN) IVPB 3.375 g     3.375 g 100 mL/hr over 30 Minutes Intravenous  Once 08/05/14 1818 08/05/14 1926      Assessment  Nikan L Reppert  74 y.o. male       Problem List:  Principal Problem:   Pneumoperitoneum of unknown etiology Active Problems:   Primary cancer of right lower  lobe of lung   PAF- on Flecainide,    Wheezing   Pleural effusion, right   Dysphagia, pharyngoesophageal phase   Dyspnea   Malnutrition of moderate degree   Erosive esophagitis on recent endoscopy   Atrial fibrillation with rapid ventricular response   Odynophagia   Esophageal stricture   Gastrostomy in place   Pneumoperitoneum without peritonitis.  Recent gastrostomy tube placement  Plan:  I suspect that he has some leaking around the gastrostomy tube either initially or current and mild.  He does not have any peritonitis.  However, he is a diabetic and has had some chemotherapy on chronic narcotics.  That being said usually you have some indication of clinical decline and abdominal pain and he has neither at this time.  EGD a month ago showed no gastritis or ulceration.  He does not have any history of diverticulosis or diverticulitis.  Discussed the case with Dr. Daryll Brod with interventional radiology who reviewed the films with me.  Discussed with Dr. Neomia Glass with emergency room Department.  Discussed with the patient and his wife.  I recommend CT scan of chest/abdomen/pelvis with oral and IV contrast.  Make sure that there is no gastric tube dislodgment or obvious leak.  If there is no fluid collection or any obvious contrast leak, could just follow the pneumoperitoneum with oral antibiotics as that should resolve on its own by exam & radiology.  If there is evidence of G tube dislodgment or leak, would require gastrostomy tube replacement/upsizing.  If he is clinically stable, it would be worth investigating  whether the tube can be upsized with interventional radiology versus with diagnostic laparoscopy.  Given his pulmonary issues and oxygen requirement, would not like to proceed with surgery first.  He does not look like he is clinically declining at this moment.  He may benefit from aspiration of his pneumoperitoneum and possible drainage of any fluid collections found.  IV antibiotics.  If he is not improved or is not a candidate for interventional radiology, may require surgery for abdominal washout and new gastrostomy tube placement.  At this point, the patient just wants to go home.  He feels like nothing is different for the past several weeks.  He is tired of going to the emergency room and getting admitted.  He does think it is reasonable to get the CAT scans though.  His wife definitely wants that as well.  Left obtained that first and regroup.  Recommend controlling his atrial fibrillation better.  Hopefully is just a matter of him getting his usual medications.      Adin Hector, M.D., F.A.C.S. [facility] [address] [place], [place] [address] [phone] Main / Paging   08/05/2014  Note: Portions of this report may have been transcribed using voice recognition software. Every effort was made to ensure accuracy; however, inadvertent computerized transcription errors may be present.   Any transcriptional errors that result from this process are unintentional.

## 2014-08-06 ENCOUNTER — Ambulatory Visit (HOSPITAL_COMMUNITY): Payer: Medicare HMO

## 2014-08-06 DIAGNOSIS — E43 Unspecified severe protein-calorie malnutrition: Secondary | ICD-10-CM | POA: Insufficient documentation

## 2014-08-06 DIAGNOSIS — R1310 Dysphagia, unspecified: Secondary | ICD-10-CM

## 2014-08-06 DIAGNOSIS — K668 Other specified disorders of peritoneum: Secondary | ICD-10-CM | POA: Diagnosis present

## 2014-08-06 LAB — COMPREHENSIVE METABOLIC PANEL
ALT: 30 U/L (ref 17–63)
AST: 20 U/L (ref 15–41)
Albumin: 2.2 g/dL — ABNORMAL LOW (ref 3.5–5.0)
Alkaline Phosphatase: 85 U/L (ref 38–126)
Anion gap: 7 (ref 5–15)
BILIRUBIN TOTAL: 0.3 mg/dL (ref 0.3–1.2)
BUN: 11 mg/dL (ref 6–20)
CALCIUM: 8.8 mg/dL — AB (ref 8.9–10.3)
CO2: 29 mmol/L (ref 22–32)
Chloride: 98 mmol/L — ABNORMAL LOW (ref 101–111)
Creatinine, Ser: 0.56 mg/dL — ABNORMAL LOW (ref 0.61–1.24)
GFR calc Af Amer: >60 mL/min (ref >60)
GFR calc non Af Amer: >60 mL/min (ref >60)
GLUCOSE: 153 mg/dL — AB (ref 65–99)
Potassium: 3.5 mmol/L (ref 3.5–5.1)
Sodium: 134 mmol/L — ABNORMAL LOW (ref 135–145)
Total Protein: 6 g/dL — ABNORMAL LOW (ref 6.5–8.1)

## 2014-08-06 LAB — CBC
HEMATOCRIT: 30.2 % — AB (ref 39.0–52.0)
Hemoglobin: 9.6 g/dL — ABNORMAL LOW (ref 13.0–17.0)
MCH: 28.8 pg (ref 26.0–34.0)
MCHC: 31.8 g/dL (ref 30.0–36.0)
MCV: 90.7 fL (ref 78.0–100.0)
Platelets: 579 10*3/uL — ABNORMAL HIGH (ref 150–400)
RBC: 3.33 MIL/uL — ABNORMAL LOW (ref 4.22–5.81)
RDW: 16.4 % — ABNORMAL HIGH (ref 11.5–15.5)
WBC: 11.4 10*3/uL — ABNORMAL HIGH (ref 4.0–10.5)

## 2014-08-06 LAB — GLUCOSE, CAPILLARY
Glucose-Capillary: 168 mg/dL — ABNORMAL HIGH (ref 65–99)
Glucose-Capillary: 136 mg/dL — ABNORMAL HIGH (ref 65–99)
Glucose-Capillary: 147 mg/dL — ABNORMAL HIGH (ref 65–99)
Glucose-Capillary: 114 mg/dL — ABNORMAL HIGH (ref 65–99)

## 2014-08-06 LAB — MRSA PCR SCREENING: MRSA BY PCR: NEGATIVE

## 2014-08-06 MED ORDER — INSULIN ASPART 100 UNIT/ML ~~LOC~~ SOLN
0.0000 [IU] | Freq: Three times a day (TID) | SUBCUTANEOUS | Status: DC
  Administered 2014-08-06 – 2014-08-09 (×2): 2 [IU] via SUBCUTANEOUS
  Administered 2014-08-10: 3 [IU] via SUBCUTANEOUS
  Administered 2014-08-10 – 2014-08-11 (×2): 2 [IU] via SUBCUTANEOUS
  Administered 2014-08-12 (×2): 3 [IU] via SUBCUTANEOUS
  Administered 2014-08-13: 2 [IU] via SUBCUTANEOUS

## 2014-08-06 MED ORDER — ALBUTEROL SULFATE (2.5 MG/3ML) 0.083% IN NEBU
3.0000 mL | INHALATION_SOLUTION | Freq: Two times a day (BID) | RESPIRATORY_TRACT | Status: DC
  Administered 2014-08-06 – 2014-08-13 (×14): 3 mL via RESPIRATORY_TRACT
  Filled 2014-08-06 (×14): qty 3

## 2014-08-06 MED ORDER — DEXTROSE-NACL 5-0.9 % IV SOLN
INTRAVENOUS | Status: DC
  Administered 2014-08-06 – 2014-08-13 (×9): via INTRAVENOUS

## 2014-08-06 MED ORDER — DEXTROSE 5 % IV SOLN
5.0000 mg/h | INTRAVENOUS | Status: DC
  Administered 2014-08-06: 10 mg/h via INTRAVENOUS
  Administered 2014-08-06: 5 mg/h via INTRAVENOUS
  Filled 2014-08-06 (×2): qty 100

## 2014-08-06 MED ORDER — SODIUM CHLORIDE 3 % IN NEBU
5.0000 mL | INHALATION_SOLUTION | Freq: Once | RESPIRATORY_TRACT | Status: AC | PRN
  Filled 2014-08-06: qty 8

## 2014-08-06 MED ORDER — FLECAINIDE ACETATE 100 MG PO TABS
100.0000 mg | ORAL_TABLET | Freq: Two times a day (BID) | ORAL | Status: DC
  Administered 2014-08-06 – 2014-08-10 (×6): 100 mg
  Filled 2014-08-06 (×12): qty 1

## 2014-08-06 MED ORDER — PIPERACILLIN-TAZOBACTAM 3.375 G IVPB
3.3750 g | Freq: Three times a day (TID) | INTRAVENOUS | Status: DC
  Administered 2014-08-06 (×2): 3.375 g via INTRAVENOUS
  Filled 2014-08-06 (×2): qty 50

## 2014-08-06 MED ORDER — PIPERACILLIN-TAZOBACTAM 3.375 G IVPB
3.3750 g | Freq: Four times a day (QID) | INTRAVENOUS | Status: DC
  Administered 2014-08-06 – 2014-08-08 (×7): 3.375 g via INTRAVENOUS
  Filled 2014-08-06 (×9): qty 50

## 2014-08-06 MED ORDER — ONDANSETRON HCL 4 MG/2ML IJ SOLN
4.0000 mg | Freq: Four times a day (QID) | INTRAMUSCULAR | Status: DC | PRN
  Administered 2014-08-06 – 2014-08-09 (×3): 4 mg via INTRAVENOUS
  Filled 2014-08-06 (×3): qty 2

## 2014-08-06 MED ORDER — SODIUM CHLORIDE 0.9 % IV SOLN: INTRAVENOUS | Status: AC

## 2014-08-06 MED ORDER — LIDOCAINE VISCOUS 2 % MT SOLN
20.0000 mL | Freq: Four times a day (QID) | OROMUCOSAL | Status: DC | PRN
  Filled 2014-08-06: qty 20

## 2014-08-06 MED ORDER — ALBUTEROL SULFATE (2.5 MG/3ML) 0.083% IN NEBU
5.0000 mg | INHALATION_SOLUTION | Freq: Once | RESPIRATORY_TRACT | Status: AC
  Administered 2014-08-06: 5 mg via RESPIRATORY_TRACT
  Filled 2014-08-06: qty 6

## 2014-08-06 MED ORDER — HYDROMORPHONE HCL 2 MG/ML IJ SOLN
0.5000 mg | INTRAMUSCULAR | Status: DC | PRN
  Administered 2014-08-06 (×3): 0.5 mg via INTRAVENOUS
  Filled 2014-08-06 (×3): qty 1

## 2014-08-06 MED ORDER — HYDROMORPHONE HCL 2 MG/ML IJ SOLN
0.5000 mg | INTRAMUSCULAR | Status: DC | PRN
  Administered 2014-08-06 – 2014-08-08 (×13): 0.5 mg via INTRAVENOUS
  Filled 2014-08-06 (×14): qty 1

## 2014-08-06 MED ORDER — ONDANSETRON HCL 4 MG PO TABS: 4.0000 mg | ORAL_TABLET | Freq: Four times a day (QID) | ORAL | Status: DC | PRN

## 2014-08-06 MED ORDER — ACETAMINOPHEN 650 MG RE SUPP: 650.0000 mg | Freq: Four times a day (QID) | RECTAL | Status: DC | PRN

## 2014-08-06 MED ORDER — ACETAMINOPHEN 325 MG PO TABS: 650.0000 mg | ORAL_TABLET | Freq: Four times a day (QID) | ORAL | Status: DC | PRN

## 2014-08-06 MED ORDER — TEMAZEPAM 15 MG PO CAPS
30.0000 mg | ORAL_CAPSULE | Freq: Every evening | ORAL | Status: DC | PRN
  Administered 2014-08-10 – 2014-08-12 (×4): 30 mg
  Filled 2014-08-06 (×4): qty 2

## 2014-08-06 MED ORDER — OXYCODONE HCL 5 MG/5ML PO SOLN
5.0000 mg | ORAL | Status: DC | PRN
  Administered 2014-08-06 – 2014-08-07 (×3): 5 mg via ORAL
  Filled 2014-08-06 (×5): qty 5

## 2014-08-06 NOTE — Progress Notes (Signed)
Initial Nutrition Assessment  DOCUMENTATION CODES:   Severe malnutrition in context of chronic illness  INTERVENTION:  - When TF appropriate, recommend Osmolite 1.5, 4 cans/day initially. This will provide 1422 kcal, 60 grams protein, and 724 mL free water - RD will continue to monitor for needs  NUTRITION DIAGNOSIS:   Inadequate oral intake related to inability to eat as evidenced by NPO status.  GOAL:   Patient will meet greater than or equal to 90% of their needs  MONITOR:   Diet advancement, TF tolerance, Weight trends, Labs, I & O's  REASON FOR ASSESSMENT:   Malnutrition Screening Tool  ASSESSMENT:  74 y.o. male With PMH significant for A fib, COPD, Diabetes, Non small lung cell cancer, Patient had bronchial stent placement at [facility] today 7-28, per report pulmonary fellow relates stent placement went well. Patient was on his way home from [facility] when he received a call, that he had air in the abdomen and he was refer to the near ED.  Patient also has been having left upper quadrant pain for months. He had endoscopy that showed esophageal ulcer and erosive gastritis. He has had G tube placed on July 19-2016. He denies worsening abdominal pain. Denies worsening dyspnea.  In the ED he was found to be in A fib RVR. He didn't take his medications today.  Dr Johney Maine with sx evaluated patient. He recommend Ct abdomen, pelvis and chest which showed: Large volume free intraperitoneal air.   Pt seen for MST. BMI indicates normal weight status. Pt with PEG placed 07/27/14 but currently unable to use pending surgery recommendations related to free intraperitoneal air.   Spoke with pt and wife at time of visit but wife provides most of the information. Prior to this admission, since being d/c from the hospital, pt had gotten up to 4 cans Osmolite 1.5/day without issue. He was doing small amounts of liquids PO such as water or ice cream that he had allowed to melt in his mouth. Boost and  applesauce tasted "awful" to him and he refused to consume these.  Goal for TF: 6 cans Osmolite 1.5/day which will provide 2133 kcal, 89 grams protein, and 1086 mL free water. Recommend adding Prostat once/day to this regimen to provide additional 100 kcal and 15 grams of protein.   Pt unable to meet needs due to NPO status. He states last PO intake was Wednesday night (7/27). Moderate to severe muscle and moderate to severe fat wasting. Medications reviewed. Labs reviewed; Na: 134 mmol/L, Cl: 98 mmol/L, Ca: 8.8 mg/dL.   Diet Order:  Diet NPO time specified  Skin:  Reviewed, no issues  Last BM:  PTA  Height:   Ht Readings from Last 1 Encounters:  08/02/14 '5\' 11"'$  (1.803 m)    Weight:   Wt Readings from Last 1 Encounters:  08/02/14 163 lb (73.936 kg)    Ideal Body Weight:  78.18 kg  BMI:  22.7 kg/m2  Estimated Nutritional Needs:   Kcal:  2200-2400  Protein:  110-120 grams  Fluid:  2.2 L/day  EDUCATION NEEDS:   No education needs identified at this time     Jarome Matin, RD, LDN Inpatient Clinical Dietitian Pager # [phone] After hours/weekend pager # [phone]

## 2014-08-06 NOTE — Progress Notes (Signed)
MD paged to clarify what exactly needed to be done with Gastrotomy tube. Original ordered said to place both NGT and gastrostomy to low wall suction. Dr. Barry Dienes clarified that the G tube should be put to gravity without the addition of NG tube. She also stated that the patient should remain NPO with no medications in G tube for 24 hours.

## 2014-08-06 NOTE — Progress Notes (Signed)
Patient ID: Steve [name], male   DOB: 1940/11/30, 74 y.o.   MRN: [id number] [facility] PROGRESS NOTE  COMPTON BRIGANCE NTI:[id number] DOB: January 11, 1940 DOA: 08/05/2014 PCP: [name], MD  Oncology: Dr. Curt Bears  Brief narrative:    74 year old man with0 a history of atrial fibrillation, dyslipidemia, metastatic non-small cell lung cancer (initially diagnosed in 12/2012, status post immunotherapy started 09/2013 (has completed 12 cycles), on systemic chemotherapy with gemcitabine, hospitalized 07/24/14 through 07/29/2014 for odynophagia and dysphagia thought to be from extrinsic compression from tumor not amenable to esophageal stenting. Due to poor nutritional status, PEG tube was placed 07/27/2014 by IR. He underwent stent placement to right bronchus at [facility] earlier on the day of this admission and on the way to [facility] was called that he has free air on x-ray and to go to ED immediately.  Patient reported chronic abdominal pain and leakage around PEG tube. No associated nausea or vomiting. No fevers.   In ED, his BP was 85/66, HR 79-113, RR 5-23, T max 98.7 F and oxygen saturation 92-100% on [place] oxygen support. Blood work revealed WBC count 12.1, hemoglobin 10.4, platelets 613, normal creatinine. Plain abd films showed free air. CT abd demonstrated large volume free intraperitoneal air, subcutaneous air in the left upper quadrant and in the lower left chest. There also are 2 air bubbles tracking along the percutaneous gastrostomy tube within the anterior abdominal wall which suggests the possibility of the percutaneous gastrostomy being the etiology of the free air. In the chest, the left mainstem bronchial stent appears to be satisfactorily positioned. There is new consolidation of left lower lobe basal segments. There is also worsened esophageal dilatation with air-fluid level, probably due to compromise of the esophagus by the sub- carinal mass.  Because he was found to  have a fib with RVR he was started on Cardizem drip and surgery was consulted about the findings of free air on abd x ray and CT.  Assessment/Plan:    Principal Problem: Pneumoperitoneum / Leukocytosis  - Based on CT abdomen possibly PEG tube as etiology because 2 air bubbles noted tracking along the PEG tube within anterior abdominal wall - He is on IV zosyn per surgery recommendations - Per surgery, if pt does not improve or is not a candidate for interventional radiology he may require surgery for abdominal washout and new gastrostomy tube placement.  Active Problems: Metastatic non-small cell lung cancer: - Currently on systemic chemotherapy with single agent gemcitabine due to progression of the disease  Atrial fibrillation with RVR - CHADS2 vasc score at least 3 - HR controlled with Cardizem drip - On aspirin daily   Dysphagia / Odynophagia - Still present but ok with small sips of water - The symptoms are from extrinsic compression and esophagitis  Anemia in neoplastic disease - Hemoglobin stable  - No current indications for transfusion   Protein-calorie malnutrition, severe - In the context of chronic illness - Nutrition consulted   DVT Prophylaxis  - SCD's bilaterally    Code Status: DNR/DNI Family Communication:  plan of care discussed with the patient Disposition Plan: remains in SDU since on Cardizem drip   IV access:  PAC  Procedures and diagnostic studies:    Ct Chest W Contrast 08/05/2014  1. Large volume free intraperitoneal air. No focal abnormality is evident to identify a perforated viscus. 2. There is subcutaneous air in the left upper quadrant and in the lower left chest. There also are  2 air bubbles tracking along the percutaneous gastrostomy tube within the anterior abdominal wall. This suggests the possibility of the percutaneous gastrostomy being the etiology of the free air, but it is not conclusive. The gastrostomy device itself appears to be  satisfactorily positioned. 3. In the chest, the left mainstem bronchial stent appears to be satisfactorily positioned. There is new consolidation of left lower lobe basal segments. There is worsened esophageal dilatation with air-fluid level, probably due to compromise of the esophagus by the sub- carinal mass. There is slight interval enlargement of a moderate right pleural effusion.   Electronically Signed   By: Andreas Newport M.D.   On: 08/05/2014 21:11   Ct Abdomen Pelvis W Contrast 08/05/2014  1. Large volume free intraperitoneal air. No focal abnormality is evident to identify a perforated viscus. 2. There is subcutaneous air in the left upper quadrant and in the lower left chest. There also are 2 air bubbles tracking along the percutaneous gastrostomy tube within the anterior abdominal wall. This suggests the possibility of the percutaneous gastrostomy being the etiology of the free air, but it is not conclusive. The gastrostomy device itself appears to be satisfactorily positioned. 3. In the chest, the left mainstem bronchial stent appears to be satisfactorily positioned. There is new consolidation of left lower lobe basal segments. There is worsened esophageal dilatation with air-fluid level, probably due to compromise of the esophagus by the sub- carinal mass. There is slight interval enlargement of a moderate right pleural effusion.   Electronically Signed   By: Andreas Newport M.D.   On: 08/05/2014 21:11   Dg Abd Acute W/chest 08/05/2014   Free air. Further evaluation with CT of abdomen pelvis is recommended.    Medical Consultants:  Surgery  Other Consultants:  Nutrition Physical therapy  IAnti-Infectives:   Zosyn 08/05/2014 -->    Leisa Lenz, MD  [facility] Pager [phone]  Time spent in minutes: 25 minutes  If 7PM-7AM, please contact night-coverage [email or web address] Password [email or web address] 08/06/2014, 3:45 PM   LOS: 1 day    HPI/Subjective: No acute overnight events.  Patient reports feeling better since admission but overall feeling weak.  Objective: Filed Vitals:   08/06/14 0700 08/06/14 0757 08/06/14 0800 08/06/14 1200  BP: 88/69 108/76 108/76   Pulse: 113 108 105   Temp:   97.5 F (36.4 C) 97.6 F (36.4 C)  TempSrc:   Oral Oral  Resp: '14 14 11   '$ SpO2: 97% 98% 100%     Intake/Output Summary (Last 24 hours) at 08/06/14 1545 Last data filed at 08/06/14 1410  Gross per 24 hour  Intake 1623.51 ml  Output   1225 ml  Net 398.51 ml    Exam:   General:  Pt is alert, follows commands appropriately, not in acute distress  Cardiovascular: tachycardic, appreciate S1, S2  Respiratory: Clear to auscultation bilaterally, no wheezing, no crackles, no rhonchi  Abdomen: Soft, non tender, non distended, bowel sounds present; has PEG tube  Extremities: No edema, pulses DP and PT palpable bilaterally  Neuro: Grossly nonfocal  Data Reviewed: Basic Metabolic Panel:  Recent Labs Lab 08/05/14 1825 08/06/14 0430  NA 133* 134*  K 3.6 3.5  CL 94* 98*  CO2 29 29  GLUCOSE 168* 153*  BUN 10 11  CREATININE 0.57* 0.56*  CALCIUM 9.5 8.8*   Liver Function Tests:  Recent Labs Lab 08/05/14 1825 08/06/14 0430  AST 24 20  ALT 38 30  ALKPHOS 97 85  BILITOT 0.4 0.3  PROT  6.7 6.0*  ALBUMIN 2.5* 2.2*   No results for input(s): LIPASE, AMYLASE in the last 168 hours. No results for input(s): AMMONIA in the last 168 hours. CBC:  Recent Labs Lab 08/05/14 1825 08/06/14 0430  WBC 12.1* 11.4*  NEUTROABS 11.0*  --   HGB 10.4* 9.6*  HCT 32.9* 30.2*  MCV 90.4 90.7  PLT 613* 579*   Cardiac Enzymes: No results for input(s): CKTOTAL, CKMB, CKMBINDEX, TROPONINI in the last 168 hours. BNP: Invalid input(s): POCBNP CBG:  Recent Labs Lab 08/06/14 0745 08/06/14 1126  GLUCAP 168* 136*    Recent Results (from the past 240 hour(s))  MRSA PCR Screening     Status: None   Collection Time: 08/06/14  2:34 AM  Result Value Ref Range Status   MRSA  by PCR NEGATIVE NEGATIVE Final    Comment:        The GeneXpert MRSA Assay (FDA approved for NASAL specimens only), is one component of a comprehensive MRSA colonization surveillance program. It is not intended to diagnose MRSA infection nor to guide or monitor treatment for MRSA infections.      Scheduled Meds: . sodium chloride   Intravenous STAT  . albuterol  3 mL Inhalation BID  . flecainide  100 mg Per Tube BID  . insulin aspart  0-15 Units Subcutaneous TID WC  . piperacillin-tazobactam (ZOSYN)  IV  3.375 g Intravenous Q8H   Continuous Infusions: . dextrose 5 % and 0.9% NaCl 75 mL/hr at 08/06/14 0800  . diltiazem (CARDIZEM) infusion 10 mg/hr (08/06/14 1433)

## 2014-08-06 NOTE — Care Management Note (Signed)
Case Management Note  Patient Details  Name: ZAYLEN SUSMAN MRN: [id number] Date of Birth: February 12, 1940  Subjective/Objective:           A.Fib with rvr         Action/Plan: Date:  August 06, 2014 U.R. performed for needs and level of care. Will continue to follow for Case Management needs.  Velva Harman, RN, BSN, [place]   [phone]  Expected Discharge Date:  08/11/14               Expected Discharge Plan:  Home/Self Care  In-House Referral:  NA  Discharge planning Services  CM Consult  Post Acute Care Choice:  NA Choice offered to:  NA  DME Arranged:  N/A DME Agency:  NA  HH Arranged:  NA HH Agency:  NA  Status of Service:  In process, will continue to follow  Medicare Important Message Given:    Date Medicare IM Given:    Medicare IM give by:    Date Additional Medicare IM Given:    Additional Medicare Important Message give by:     If discussed at [place] of Stay Meetings, dates discussed:    Additional Comments:  Leeroy Cha, RN 08/06/2014, 10:32 AM

## 2014-08-06 NOTE — Progress Notes (Signed)
ANTIBIOTIC CONSULT NOTE  Pharmacy Consult for Zosyn Indication: Intra-abdominal infection  Allergies  Allergen Reactions  . Morphine Hives    Tolerated dilaudid on multiple occasions.  Takes OxyContin at home  . Morphine And Related Hives    Tolerated dilaudid on multiple occasions.  Takes OxyContin at home    Patient Measurements:   Weight 73.9 kg (08/02/14)  Vital Signs: Temp: 97.6 F (36.4 C) (07/29 1200) Temp Source: Oral (07/29 1200) BP: 108/76 mmHg (07/29 0800) Pulse Rate: 105 (07/29 0800)  Labs:  Recent Labs  08/05/14 1825 08/06/14 0430  WBC 12.1* 11.4*  HGB 10.4* 9.6*  PLT 613* 579*  CREATININE 0.57* 0.56*   Estimated Creatinine Clearance: 86 mL/min (by C-G formula based on Cr of 0.56).   Medications, Infusions:  . dextrose 5 % and 0.9% NaCl 75 mL/hr at 08/06/14 0800  . diltiazem (CARDIZEM) infusion 10 mg/hr (08/06/14 1433)   Assessment: 65 yoM found to have pneumoperitoneum without peritonitis.  Pharmacy was consulted to dose Zosyn for intra-abdominal infection.  RN called pharmacy on 7/29 to inform that patient only has one route for IV access and currently needs continuous infusion diltiazem.  Zosyn and diltiazem are incompatible.  Pharmacy is requested to reduce infusion time of Zosyn to decrease interruption time of diltiazem infusion.   Goal of Therapy:  Appropriate abx dosing, eradication of infection.   Plan:   Change to Zosyn 3.375 g IV q6h, each dose over 30 minutes.  If diltiazem infusion is d/c, change back to Zosyn 3.375g IV Q8H infused over 4hrs.    Follow up renal fxn, culture results, and clinical course.   Gretta Arab PharmD, BCPS Pager [phone] 08/06/2014 4:20 PM

## 2014-08-06 NOTE — Progress Notes (Signed)
ANTIBIOTIC CONSULT NOTE - INITIAL  Pharmacy Consult for Zosyn Indication: Intra-abdominal infection  Allergies  Allergen Reactions  . Morphine Hives  . Morphine And Related Hives    Patient Measurements:   Wt=73 kg  Vital Signs: Temp: 97.7 F (36.5 C) (07/28 1710) Temp Source: Oral (07/28 1710) BP: 101/60 mmHg (07/29 0000) Pulse Rate: 96 (07/29 0000) Intake/Output from previous day: 07/28 0701 - 07/29 0700 In: 1100 [I.V.:1100] Out: 350 [Urine:350] Intake/Output from this shift: Total I/O In: 1100 [I.V.:1100] Out: 350 [Urine:350]  Labs:  Recent Labs  08/05/14 1825  WBC 12.1*  HGB 10.4*  PLT 613*  CREATININE 0.57*   Estimated Creatinine Clearance: 86 mL/min (by C-G formula based on Cr of 0.57). No results for input(s): VANCOTROUGH, VANCOPEAK, VANCORANDOM, GENTTROUGH, GENTPEAK, GENTRANDOM, TOBRATROUGH, TOBRAPEAK, TOBRARND, AMIKACINPEAK, AMIKACINTROU, AMIKACIN in the last 72 hours.   Microbiology: Recent Results (from the past 720 hour(s))  Blood culture (routine x 2)     Status: None   Collection Time: 07/14/14  7:39 PM  Result Value Ref Range Status   Specimen Description BLOOD PORTA CATH  Final   Special Requests BOTTLES DRAWN AEROBIC AND ANAEROBIC 5CC  Final   Culture   Final    NO GROWTH 5 DAYS Performed at [facility]    Report Status 07/20/2014 FINAL  Final  Blood culture (routine x 2)     Status: None   Collection Time: 07/14/14  8:39 PM  Result Value Ref Range Status   Specimen Description BLOOD RIGHT HAND  Final   Special Requests BOTTLES DRAWN AEROBIC ONLY 3ML  Final   Culture   Final    NO GROWTH 5 DAYS Performed at [facility]    Report Status 07/20/2014 FINAL  Final  MRSA PCR Screening     Status: None   Collection Time: 07/14/14 11:53 PM  Result Value Ref Range Status   MRSA by PCR NEGATIVE NEGATIVE Final    Comment:        The GeneXpert MRSA Assay (FDA approved for NASAL specimens only), is one component of  a comprehensive MRSA colonization surveillance program. It is not intended to diagnose MRSA infection nor to guide or monitor treatment for MRSA infections.   Blood culture (routine x 2)     Status: None   Collection Time: 07/24/14  3:35 PM  Result Value Ref Range Status   Specimen Description BLOOD RIGHT ANTECUBITAL  Final   Special Requests BOTTLES DRAWN AEROBIC AND ANAEROBIC 5CC  Final   Culture   Final    NO GROWTH 5 DAYS Performed at [facility]    Report Status 07/29/2014 FINAL  Final  Blood culture (routine x 2)     Status: None   Collection Time: 07/24/14  3:38 PM  Result Value Ref Range Status   Specimen Description BLOOD LEFT HAND  Final   Special Requests BOTTLES DRAWN AEROBIC AND ANAEROBIC [facility] EACH  Final   Culture   Final    NO GROWTH 5 DAYS Performed at [facility]    Report Status 07/29/2014 FINAL  Final    Medical History: Past Medical History  Diagnosis Date  . Dizziness and giddiness     positional vertigo  . Hyperlipidemia   . Impotence of organic origin   . Osteoarthritis, shoulder   . Allergy   . Hypothyroidism     pt denies thryoid disease, dr pandey note 11-19-12 says subclinical hypothryoid in epic  . COPD (chronic obstructive pulmonary  disease)     pt denies  . Insomnia   . Hx of radiation therapy 02/2013    right lung  . Diabetes mellitus without complication     pt denies diabetes, dr pandey note says dm 11-19-12 epic  . 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 Malignant neoplasm of bladder, part unspecified   . Malignant neoplasm of bronchus and lung, unspecified site   . PAF (paroxysmal atrial fibrillation)     a. not on anticoag due to oncologic issues, on flecainide.  . Paroxysmal SVT (supraventricular tachycardia)   . Chronic respiratory failure   . Esophageal stricture   . Esophagitis   . Reactive airway disease   . Acute URI 07/29/2012  . Syncope 12/21/2013    Jan 2016 with documented  orthostatic B/P and rapid PSVT on holter   . Ethmoid sinusitis 03/10/2014  . Orthostatic hypotension-on Midodrine 07/15/2014    Medications:   (Not in a hospital admission) Scheduled:   Infusions:  . piperacillin-tazobactam (ZOSYN)  IV     Assessment: 68 yoM found to have pneumoperitoneum without peritonitis. Zosyn per Rx for intra-abdominal infection.   Goal of Therapy:  Treat infection  Plan:   Zosyn 3.375 Gm IV q8h EI  F/u SCr/cultures  Lawana Pai R 08/06/2014,12:44 AM

## 2014-08-07 LAB — GLUCOSE, CAPILLARY
GLUCOSE-CAPILLARY: 106 mg/dL — AB (ref 65–99)
GLUCOSE-CAPILLARY: 117 mg/dL — AB (ref 65–99)
Glucose-Capillary: 101 mg/dL — ABNORMAL HIGH (ref 65–99)
Glucose-Capillary: 105 mg/dL — ABNORMAL HIGH (ref 65–99)
Glucose-Capillary: 106 mg/dL — ABNORMAL HIGH (ref 65–99)
Glucose-Capillary: 133 mg/dL — ABNORMAL HIGH (ref 65–99)
Glucose-Capillary: 90 mg/dL (ref 65–99)

## 2014-08-07 MED ORDER — METOPROLOL TARTRATE 1 MG/ML IV SOLN
2.5000 mg | Freq: Four times a day (QID) | INTRAVENOUS | Status: DC
Start: 2014-08-07 — End: 2014-08-10
  Administered 2014-08-07 – 2014-08-10 (×12): 2.5 mg via INTRAVENOUS
  Filled 2014-08-07 (×12): qty 5

## 2014-08-07 MED ORDER — CHLORHEXIDINE GLUCONATE 0.12 % MT SOLN
15.0000 mL | Freq: Two times a day (BID) | OROMUCOSAL | Status: DC
  Administered 2014-08-07 – 2014-08-13 (×13): 15 mL via OROMUCOSAL
  Filled 2014-08-07 (×13): qty 15

## 2014-08-07 MED ORDER — CETYLPYRIDINIUM CHLORIDE 0.05 % MT LIQD
7.0000 mL | Freq: Two times a day (BID) | OROMUCOSAL | Status: DC
  Administered 2014-08-07 – 2014-08-12 (×9): 7 mL via OROMUCOSAL

## 2014-08-07 MED ORDER — SODIUM CHLORIDE 0.9 % IJ SOLN
10.0000 mL | INTRAMUSCULAR | Status: DC | PRN
  Administered 2014-08-10 – 2014-08-13 (×3): 10 mL
  Filled 2014-08-07 (×3): qty 40

## 2014-08-07 NOTE — Progress Notes (Signed)
Patient ID: Steve [name], male   DOB: 08-13-1940, 74 y.o.   MRN: [id number] [facility] PROGRESS NOTE  Steve [name] GLO:[id number] DOB: 11-26-1940 DOA: 08/05/2014 PCP: [name], MD  Oncology: Dr. Curt Bears  Brief narrative:    74 year old man with0 a history of atrial fibrillation, dyslipidemia, metastatic non-small cell lung cancer (initially diagnosed in 12/2012, status post immunotherapy started 09/2013 (has completed 12 cycles), on systemic chemotherapy with gemcitabine, hospitalized 07/24/14 through 07/29/2014 for odynophagia and dysphagia thought to be from extrinsic compression from tumor not amenable to esophageal stenting. Due to poor nutritional status, PEG tube was placed 07/27/2014 by IR. He underwent stent placement to right bronchus at [facility] earlier on the day of this admission and on the way to [facility] was called that he has free air on x-ray and to go to ED immediately.  Patient reported chronic abdominal pain and leakage around PEG tube. No associated nausea or vomiting. No fevers.   In ED, his BP was 85/66, HR 79-113, RR 5-23, T max 98.7 F and oxygen saturation 92-100% on  oxygen support. Blood work revealed WBC count 12.1, hemoglobin 10.4, platelets 613, normal creatinine. Plain abd films showed free air. CT abd demonstrated large volume free intraperitoneal air, subcutaneous air in the left upper quadrant and in the lower left chest. There also are 2 air bubbles tracking along the percutaneous gastrostomy tube within the anterior abdominal wall which suggests the possibility of the percutaneous gastrostomy being the etiology of the free air. In the chest, the left mainstem bronchial stent appears to be satisfactorily positioned. There is new consolidation of left lower lobe basal segments. There is also worsened esophageal dilatation with air-fluid level, probably due to compromise of the esophagus by the sub- carinal mass.  Because he was found to  have a fib with RVR he was started on Cardizem drip and surgery was consulted about the findings of free air on abd x ray and CT.  Barrier to discharge: Pt on Cardizem drip for which reason he will remain in SDU.  Assessment/Plan:    Principal Problem: Pneumoperitoneum / Leukocytosis  - CT abdomen demonstrated that PEG tube is possibly an etiology of pneumoperitoneum because 2 air bubbles noted tracking along the PEG tube within anterior abdominal wall - Per surgery recommendations we will continue zosyn  - If patient does not improve or is not a candidate for interventional radiology he may require surgery for abdominal washout and new gastrostomy tube placement. As of now he seems to be stable, improving.  Active Problems: Metastatic non-small cell lung cancer: - On systemic chemotherapy with single agent gemcitabine due to progression of the disease  Atrial fibrillation with RVR - CHADS2 vasc score at least 3 - HR 129 so we will continue Cardizem drip  - Continue daily aspirin  Dysphagia / Odynophagia - Fom extrinsic compression and esophagitis - Can tolerate small sips of water  Anemia in neoplastic disease - Hemoglobin stable  - No current indications for transfusion   Protein-calorie malnutrition, severe - In the context of chronic illness - Nutrition consulted   DVT Prophylaxis  - SCD's bilaterally    Code Status: DNR/DNI Family Communication:  plan of care discussed with the patient Disposition Plan: remains in SDU since on Cardizem drip   IV access:  PAC  Procedures and diagnostic studies:    Ct Chest W Contrast 08/05/2014  1. Large volume free intraperitoneal air. No focal abnormality is evident to identify  a perforated viscus. 2. There is subcutaneous air in the left upper quadrant and in the lower left chest. There also are 2 air bubbles tracking along the percutaneous gastrostomy tube within the anterior abdominal wall. This suggests the possibility of the  percutaneous gastrostomy being the etiology of the free air, but it is not conclusive. The gastrostomy device itself appears to be satisfactorily positioned. 3. In the chest, the left mainstem bronchial stent appears to be satisfactorily positioned. There is new consolidation of left lower lobe basal segments. There is worsened esophageal dilatation with air-fluid level, probably due to compromise of the esophagus by the sub- carinal mass. There is slight interval enlargement of a moderate right pleural effusion.   Electronically Signed   By: Andreas Newport M.D.   On: 08/05/2014 21:11   Ct Abdomen Pelvis W Contrast 08/05/2014  1. Large volume free intraperitoneal air. No focal abnormality is evident to identify a perforated viscus. 2. There is subcutaneous air in the left upper quadrant and in the lower left chest. There also are 2 air bubbles tracking along the percutaneous gastrostomy tube within the anterior abdominal wall. This suggests the possibility of the percutaneous gastrostomy being the etiology of the free air, but it is not conclusive. The gastrostomy device itself appears to be satisfactorily positioned. 3. In the chest, the left mainstem bronchial stent appears to be satisfactorily positioned. There is new consolidation of left lower lobe basal segments. There is worsened esophageal dilatation with air-fluid level, probably due to compromise of the esophagus by the sub- carinal mass. There is slight interval enlargement of a moderate right pleural effusion.   Electronically Signed   By: Andreas Newport M.D.   On: 08/05/2014 21:11   Dg Abd Acute W/chest 08/05/2014   Free air. Further evaluation with CT of abdomen pelvis is recommended.    Medical Consultants:  Surgery  Other Consultants:  Nutrition Physical therapy  IAnti-Infectives:   Zosyn 08/05/2014 -->    Leisa Lenz, MD  [facility] Pager [phone]  Time spent in minutes: 25 minutes  If 7PM-7AM, please contact  night-coverage [email or web address] Password [email or web address] 08/07/2014, 7:33 AM   LOS: 2 days    HPI/Subjective: No acute overnight events. Patient reports no nausea.   Objective: Filed Vitals:   08/07/14 0400 08/07/14 0500 08/07/14 0600 08/07/14 0700  BP: 105/78 115/70 114/75 136/80  Pulse: 44 103 111 129  Temp: 97.8 F (36.6 C)     TempSrc: Oral     Resp: '15 14 15 21  '$ Weight: 74.8 kg (164 lb 14.5 oz)     SpO2: 93% 94% 93% 95%    Intake/Output Summary (Last 24 hours) at 08/07/14 0733 Last data filed at 08/07/14 0700  Gross per 24 hour  Intake 736.58 ml  Output   1435 ml  Net -698.42 ml    Exam:   General:  Pt is alert, not in acute distress  Cardiovascular: tachycardic, S1, S2 (+)  Respiratory: no wheezing, coughing with deep inspiration   Abdomen: PEG tube in place, BS appreciated   Extremities: No leg swelling, pulses palpable   Neuro: Nonfocal  Data Reviewed: Basic Metabolic Panel:  Recent Labs Lab 08/05/14 1825 08/06/14 0430  NA 133* 134*  K 3.6 3.5  CL 94* 98*  CO2 29 29  GLUCOSE 168* 153*  BUN 10 11  CREATININE 0.57* 0.56*  CALCIUM 9.5 8.8*   Liver Function Tests:  Recent Labs Lab 08/05/14 1825 08/06/14 0430  AST 24 20  ALT  38 30  ALKPHOS 97 85  BILITOT 0.4 0.3  PROT 6.7 6.0*  ALBUMIN 2.5* 2.2*   No results for input(s): LIPASE, AMYLASE in the last 168 hours. No results for input(s): AMMONIA in the last 168 hours. CBC:  Recent Labs Lab 08/05/14 1825 08/06/14 0430  WBC 12.1* 11.4*  NEUTROABS 11.0*  --   HGB 10.4* 9.6*  HCT 32.9* 30.2*  MCV 90.4 90.7  PLT 613* 579*   Cardiac Enzymes: No results for input(s): CKTOTAL, CKMB, CKMBINDEX, TROPONINI in the last 168 hours. BNP: Invalid input(s): POCBNP CBG:  Recent Labs Lab 08/06/14 1126 08/06/14 1617 08/06/14 1916 08/06/14 2349 08/07/14 0451  GLUCAP 136* 147* 114* 133* 105*    Recent Results (from the past 240 hour(s))  MRSA PCR Screening     Status: None   Collection Time:  08/06/14  2:34 AM  Result Value Ref Range Status   MRSA by PCR NEGATIVE NEGATIVE Final    Comment:        The GeneXpert MRSA Assay (FDA approved for NASAL specimens only), is one component of a comprehensive MRSA colonization surveillance program. It is not intended to diagnose MRSA infection nor to guide or monitor treatment for MRSA infections.      Scheduled Meds: . albuterol  3 mL Inhalation BID  . flecainide  100 mg Per Tube BID  . insulin aspart  0-15 Units Subcutaneous TID WC  . piperacillin-tazobactam (ZOSYN)  IV  3.375 g Intravenous 4 times per day   Continuous Infusions: . dextrose 5 % and 0.9% NaCl 75 mL/hr at 08/06/14 0800  . diltiazem (CARDIZEM) infusion 5 mg/hr (08/06/14 1800)

## 2014-08-07 NOTE — Evaluation (Signed)
Physical Therapy Evaluation Patient Details Name: Steve [name] MRN: [id number] DOB: 1940-10-28 Today's Date: 08/07/2014   History of Present Illness  74 y.o. male admitted with pneumoperitoneum. Pt with bronchial stent placement at [facility] on date of admit and  with h/o lung cancer, s/p chemo and radiation, peripheral neuropathy  Clinical Impression  Pt admitted as above and presenting with functional mobility limitations 2* generalized weakness/deconditioning and SOB/chest pain with exertion.  Pt should progress to dc home with family assist.    Follow Up Recommendations No PT follow up    Equipment Recommendations  None recommended by PT    Recommendations for Other Services OT consult     Precautions / Restrictions Precautions Precautions: Fall Precaution Comments: pt had several episodes of syncope related to cardiac issues which are now controlled with medication per pt/wife Restrictions Weight Bearing Restrictions: No      Mobility  Bed Mobility               General bed mobility comments: OOB with nursing and declines back to bed  Transfers Overall transfer level: Needs assistance Equipment used: Rolling walker (2 wheeled) Transfers: Sit to/from Stand Sit to Stand: Min guard         General transfer comment: good safety cognition and use of hands  Ambulation/Gait Ambulation/Gait assistance: Min assist;Min guard Ambulation Distance (Feet): 150 Feet Assistive device: Rolling walker (2 wheeled) Gait Pattern/deviations: Step-through pattern;Shuffle;Trunk flexed     General Gait Details: min cues for position from RW and pacing.  LTd by increased chest discomfort - pt states from stent placement  Stairs            Wheelchair Mobility    Modified Rankin (Stroke Patients Only)       Balance                                             Pertinent Vitals/Pain Pain Assessment: 0-10 Pain Score: 6  Pain Location: Chest pain  with SOB 2* stent placement. Pain Descriptors / Indicators: Sore Pain Intervention(s): Limited activity within patient's tolerance;Monitored during session    Home Living Family/patient expects to be discharged to:: Private residence Living Arrangements: Spouse/significant other Available Help at Discharge: Family;Available PRN/intermittently Type of Home: House Home Access: Stairs to enter   [employer] of Steps: 2 Home Layout: Able to live on main level with bedroom/bathroom        Prior Function Level of Independence: Independent with assistive device(s)               Hand Dominance        Extremity/Trunk Assessment   Upper Extremity Assessment: Generalized weakness           Lower Extremity Assessment: Generalized weakness      Cervical / Trunk Assessment: Kyphotic  Communication   Communication: No difficulties  Cognition Arousal/Alertness: Awake/alert Behavior During Therapy: WFL for tasks assessed/performed Overall Cognitive Status: Within Functional Limits for tasks assessed                      General Comments      Exercises        Assessment/Plan    PT Assessment Patient needs continued PT services  PT Diagnosis Acute pain;Generalized weakness   PT Problem List Decreased strength;Decreased activity tolerance;Decreased mobility;Cardiopulmonary status limiting activity;Pain;Decreased knowledge of use of DME  PT Treatment Interventions Gait training;Stair training;Functional mobility training;Therapeutic exercise;Balance training;Patient/family education   PT Goals (Current goals can be found in the Care Plan section) Acute Rehab PT Goals Patient Stated Goal: to be able to walk without getting so SOB PT Goal Formulation: With patient/family Time For Goal Achievement: 08/09/14 Potential to Achieve Goals: Good    Frequency Min 3X/week   Barriers to discharge        Co-evaluation               End of Session  Equipment Utilized During Treatment: Gait belt Activity Tolerance: Patient tolerated treatment well;Patient limited by pain Patient left: in chair;with call bell/phone within reach;with nursing/sitter in room Nurse Communication: Mobility status         Time: 1140-1157 PT Time Calculation (min) (ACUTE ONLY): 17 min   Charges:   PT Evaluation $Initial PT Evaluation Tier I: 1 Procedure     PT G Codes:        Circe Chilton Aug 27, 2014, 12:48 PM

## 2014-08-07 NOTE — Progress Notes (Signed)
Pt converted from A. Fib to Sinus Rhythm around 11:30 this am. MD notified. Cardizem D/cd and scheduled iv metoprolol added.

## 2014-08-07 NOTE — Consult Note (Signed)
Chief Complaint: I have a g-tube.    Referring Physician(s): Dr. Lucia Gaskins of General Surgery.   History of Present Illness: Steve [name] is a 74 y.o. 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    Patient received a percutaneous g-tube electively on 07/27/2014 with VIR for nutrition purposes, without complication.    CT completed on the patients admission on 08/05/2014 shows pneumoperitoneum, with no evidence of visceral injury.  Patient reports that he is not having any new significant abdominal pain.   He reports not feeling terribly well, but does state that is not new on this admission.    He has a low WBC count, with no current fevers.    He confirms that before his day of admission, he was not venting the g-tube.     Past Medical History  Diagnosis Date  . Dizziness and giddiness     positional vertigo  . Hyperlipidemia   . Impotence of organic origin   . Osteoarthritis, shoulder   . Allergy   . Hypothyroidism     pt denies thryoid disease, dr pandey note 11-19-12 says subclinical hypothryoid in epic  . COPD (chronic obstructive pulmonary disease)     pt denies  . Insomnia   . Hx of radiation therapy 02/2013    right lung  . Diabetes mellitus without complication     pt denies diabetes, dr pandey note says dm 11-19-12 epic  . 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 Malignant neoplasm of bladder, part unspecified   . Malignant neoplasm of bronchus and lung, unspecified site   . PAF (paroxysmal atrial fibrillation)     a. not on anticoag due to oncologic issues, on flecainide.  . Paroxysmal SVT (supraventricular tachycardia)   . Chronic respiratory failure   . Esophageal stricture   . Esophagitis   . Reactive airway disease   . Acute URI 07/29/2012  . Syncope 12/21/2013    Jan 2016  with documented orthostatic B/P and rapid PSVT on holter   . Ethmoid sinusitis 03/10/2014  . Orthostatic hypotension-on Midodrine 07/15/2014    Past Surgical History  Procedure Laterality Date  . Cystoscopy w/ dilation of bladder    . Tonsillectomy  age 46  . Endobronchial ultrasound Bilateral 12/15/2012    Procedure: ENDOBRONCHIAL ULTRASOUND;  Surgeon: Brand Males, MD;  Location: [facility];  Service: Cardiopulmonary;  Laterality: Bilateral;  . Colonoscopy  2009    [facility]    Allergies: Morphine and Morphine and related  Medications: Prior to Admission medications   Medication Sig Start Date End Date Taking? Authorizing Provider  albuterol (PROVENTIL) (5 MG/ML) 0.5% nebulizer solution Inhale 0.5 mLs into the lungs 2 (two) times daily. 08/05/14 11/03/14 Yes Historical Provider, MD  aspirin 81 MG tablet Place 1 tablet (81 mg total) into feeding tube daily. 07/29/14  Yes Velvet Bathe, MD  diltiazem (CARDIZEM) 90 MG tablet Take 1 tablet (90 mg total) by mouth every 6 (six) hours. 07/30/14  Yes Fay Records, MD  flecainide (TAMBOCOR) 100 MG tablet Place 1 tablet (100 mg total) into feeding tube 2 (two) times daily. 07/30/14  Yes Velvet Bathe, MD  guaiFENesin (MUCINEX) 600 MG 12 hr tablet Take 600 mg by mouth every 12 (twelve) hours as needed for cough.   Yes Historical Provider, MD  lactose free nutrition (BOOST PLUS) LIQD Take 237 mLs by mouth 2 (two) times daily between meals. 07/29/14  Yes Velvet Bathe, MD  lidocaine (XYLOCAINE) 2 % solution Use as directed 20 mLs in the mouth or throat every 6 (six) hours as needed for mouth pain. 06/22/14  Yes Maryanna Shape, NP  Nutritional Supplements (FEEDING SUPPLEMENT, OSMOLITE 1.5 CAL,) LIQD Place 237 mLs into feeding tube 4 (four) times daily. 07/29/14  Yes Velvet Bathe, MD  omeprazole (PRILOSEC) 40 MG capsule Take 40 mg by mouth 2 (two) times daily.  07/22/14  Yes Historical Provider, MD  oxyCODONE (ROXICODONE) 5 MG/5ML solution Take 5 mLs (5 mg  total) by mouth every 4 (four) hours as needed for moderate pain. 07/29/14  Yes Velvet Bathe, MD  polyethylene glycol (MIRALAX / GLYCOLAX) packet Take 17 g by mouth daily as needed for mild constipation or moderate constipation.    Yes Historical Provider, MD  simvastatin (ZOCOR) 20 MG tablet Place 1 tablet (20 mg total) into feeding tube daily. Take one tablet by mouth once daily to lower cholesterol 07/29/14  Yes Velvet Bathe, MD  sodium chloride HYPERTONIC 3 % nebulizer solution Inhale 3 mLs into the lungs 2 (two) times daily. 08/05/14 11/03/14 Yes Historical Provider, MD  temazepam (RESTORIL) 30 MG capsule Place 1 capsule (30 mg total) into feeding tube at bedtime as needed for sleep. 07/29/14  Yes Velvet Bathe, MD  Water For Irrigation, Sterile (FREE WATER) SOLN Place 120 mLs into feeding tube 4 (four) times daily. 07/29/14  Yes Velvet Bathe, MD  Amino Acids-Protein Hydrolys (FEEDING SUPPLEMENT, PRO-STAT SUGAR FREE 64,) LIQD Place 30 mLs into feeding tube 2 (two) times daily. Patient not taking: Reported on 08/05/2014 07/29/14   Velvet Bathe, MD  pantoprazole sodium (PROTONIX) 40 mg/20 mL PACK Place 40 mLs (80 mg total) into feeding tube daily. Patient not taking: Reported on 08/05/2014 07/17/14   Hosie Poisson, MD     Family History  Problem Relation Age of Onset  . Kidney disease Brother   . Hypertension Mother   . Diabetes Mother   . Cancer Father     type unknown  . Stroke Father     History   Social History  . Marital Status: Married    Spouse Name: N/A  . Number of Children: 3  . Years of Education: N/A   Occupational History  . business owner    Social History Main Topics  . Smoking status: Former Smoker -- 1.50 packs/day for 50 years    Types: Cigarettes    Quit date: 01/09/2007  . Smokeless tobacco: Former Systems developer    Types: Chew    Quit date: 05/28/2014  . Alcohol Use: No  . Drug Use: No  . Sexual Activity: Not on file   Other Topics Concern  . None   Social History  Narrative    ECOG Status: 1 - Symptomatic but completely ambulatory  Review of Systems: A 12 point ROS discussed and pertinent positives are indicated in the HPI above.  All other systems are negative.  Review of Systems  Vital Signs: BP 135/84 mmHg  Pulse 102  Temp(Src) 97.8 F (36.6 C) (Oral)  Resp 21  Wt 164 lb 14.5 oz (74.8 kg)  SpO2 92%  Physical Exam  Targeted physical exam demonstrates no evidence of infection at the percutaneous gastrostomy site, with usual changes at the ostomy.  No ballotable fluid collection or abscess.  No muscle guarding or rigidity.  The g-tube is being  vented to a gravity bag.    Mallampati Score:     Imaging: Dg Chest 2 View  07/24/2014   CLINICAL DATA:  Shortness of breath and difficulty swallowing.  EXAM: CHEST  2 VIEW  COMPARISON:  07/14/2014  FINDINGS: Right chest wall port a catheter is noted with tip in the cavoatrial junction. The heart size appears normal. Right pleural effusion is again identified. No left effusion. Right lower lobe lung mass is stable from previous exam.  IMPRESSION: 1. No acute findings and no significant change from 07/14/2014   Electronically Signed   By: Kerby Moors M.D.   On: 07/24/2014 16:01   Ct Chest W Contrast  08/05/2014   CLINICAL DATA:  Left mainstem bronchus stent placed this morning. Was found to have free intraperitoneal air and was sent to the emergency room.  EXAM: CT CHEST, ABDOMEN, AND PELVIS WITH CONTRAST  TECHNIQUE: Multidetector CT imaging of the chest, abdomen and pelvis was performed following the standard protocol during bolus administration of intravenous contrast.  CONTRAST:  29m OMNIPAQUE IOHEXOL 300 MG/ML SOLN, 1026mOMNIPAQUE IOHEXOL 300 MG/ML SOLN  COMPARISON:  07/14/2014  FINDINGS: CT CHEST FINDINGS  The left mainstem bronchus stent appears well-positioned. Endo stent lumen is widely patent. There is no significant change in the peripherally enhancing centrally hypodense 5.5 cm sub-  carinal mass. There is no significant change in the central right lower lobe peripherally enhancing 4.7 x 7.2 cm mass. There is a moderate right pleural effusion which is marginally larger than on 07/14/2014. There is consolidation and air bronchograms in the posterior basal segment of the left lower lobe, new, and there is a lesser degree of consolidation and volume loss in the lateral basal segment. There is a small pericardial effusion, unchanged. There is mild esophageal dilatation with an air-fluid level and this appears a little worsened. It may be due to compromise to the esophagus by the subcarinal mass.  There is no pneumothorax or pneumomediastinum.  CT ABDOMEN AND PELVIS FINDINGS  There is a large volume free intraperitoneal air. Most of the air is collected anteriorly over the liver and around the stomach. There also is a small volume of subcutaneous air in the left upper quadrant, above and below the percutaneous gastrostomy but not directly around it. On sagittal image 78, series 7 there are 2 air bubbles tracking alongside the percutaneous gastrostomy tube in the abdominal wall musculature.  No focal inflammatory changes are evident in the abdomen or pelvis. No focally inflamed or irregular bowel is evident to identified location of a perforated viscus. There are unremarkable appearances of the colon. The stomach is remarkable only for presence of the percutaneous gastrostomy. There is good contrast distention of the stomach and duodenum with no evidence of contrast extravasation. Small bowel is unremarkable.  There are normal appearances of the liver, gallbladder, bile ducts, pancreas, spleen, adrenals and kidneys.  The abdominal aorta is normal in caliber with moderate atherosclerotic calcification.  No significant skeletal lesions are evident.  IMPRESSION: 1. Large volume free intraperitoneal air. No focal abnormality is evident to identify a perforated viscus. 2. There is subcutaneous air in the  left upper quadrant and in the lower left chest. There also are 2 air bubbles tracking along the percutaneous gastrostomy tube within the anterior abdominal wall. This suggests the possibility of the percutaneous gastrostomy being the etiology of the free air, but it is not conclusive. The gastrostomy device itself appears to be satisfactorily positioned. 3. In the chest, the  left mainstem bronchial stent appears to be satisfactorily positioned. There is new consolidation of left lower lobe basal segments. There is worsened esophageal dilatation with air-fluid level, probably due to compromise of the esophagus by the sub- carinal mass. There is slight interval enlargement of a moderate right pleural effusion.   Electronically Signed   By: Andreas Newport M.D.   On: 08/05/2014 21:11   Ct Angio Chest Pe W/cm &/or Wo Cm  07/14/2014   CLINICAL DATA:  74 year old male with history of lung cancer and COPD complaining of shortness of breath.  EXAM: CT ANGIOGRAPHY CHEST WITH CONTRAST  TECHNIQUE: Multidetector CT imaging of the chest was performed using the standard protocol during bolus administration of intravenous contrast. Multiplanar CT image reconstructions and MIPs were obtained to evaluate the vascular anatomy.  CONTRAST:  182m OMNIPAQUE IOHEXOL 350 MG/ML SOLN  COMPARISON:  Chest radiograph dated 07/14/2014 and CT dated 05/14/2014.  FINDINGS: Mild emphysematous changes. There is stable appearing 7.0 x 4.5 cm right lower lobe mass with encasement of the right middle and right lower lobe bronchi and occlusion of the right lower lobe bronchus. There is associated postobstructive atelectasis versus pneumonia. Stable moderate right pleural effusion again noted. There is apparent extension of the mass into the right hilar and subcarinal region.  Mild atherosclerotic calcification of the aorta. No CT evidence of pulmonary embolism. There is encasement and complete occlusion of the right lower lobe pulmonary artery  branch by the lung mass. No cardiomegaly. There is coronary vascular calcification. Small pericardial effusion.  Right pectoral Port-A-Cath with tip in central SVC. Partially visualized subcentimeter left hepatic hypodense lesion, incompletely characterized. Degenerative changes of the spine. No acute fracture.  Review of the MIP images confirms the above findings.  IMPRESSION: No CT evidence of pulmonary embolism.  Stable appearing right lower lobe mass with encasement of the right lower lobe pulmonary arteries branch as well as right lower lobe bronchus. There is postobstructive atelectasis/ pneumonia.  Grossly stable right pleural effusion.  Scratch   Electronically Signed   By: AAnner CreteM.D.   On: 07/14/2014 22:45   Ct Abdomen Pelvis W Contrast  08/05/2014   CLINICAL DATA:  Left mainstem bronchus stent placed this morning. Was found to have free intraperitoneal air and was sent to the emergency room.  EXAM: CT CHEST, ABDOMEN, AND PELVIS WITH CONTRAST  TECHNIQUE: Multidetector CT imaging of the chest, abdomen and pelvis was performed following the standard protocol during bolus administration of intravenous contrast.  CONTRAST:  251mOMNIPAQUE IOHEXOL 300 MG/ML SOLN, 10022mMNIPAQUE IOHEXOL 300 MG/ML SOLN  COMPARISON:  07/14/2014  FINDINGS: CT CHEST FINDINGS  The left mainstem bronchus stent appears well-positioned. Endo stent lumen is widely patent. There is no significant change in the peripherally enhancing centrally hypodense 5.5 cm sub- carinal mass. There is no significant change in the central right lower lobe peripherally enhancing 4.7 x 7.2 cm mass. There is a moderate right pleural effusion which is marginally larger than on 07/14/2014. There is consolidation and air bronchograms in the posterior basal segment of the left lower lobe, new, and there is a lesser degree of consolidation and volume loss in the lateral basal segment. There is a small pericardial effusion, unchanged. There is mild  esophageal dilatation with an air-fluid level and this appears a little worsened. It may be due to compromise to the esophagus by the subcarinal mass.  There is no pneumothorax or pneumomediastinum.  CT ABDOMEN AND PELVIS FINDINGS  There is a large  volume free intraperitoneal air. Most of the air is collected anteriorly over the liver and around the stomach. There also is a small volume of subcutaneous air in the left upper quadrant, above and below the percutaneous gastrostomy but not directly around it. On sagittal image 78, series 7 there are 2 air bubbles tracking alongside the percutaneous gastrostomy tube in the abdominal wall musculature.  No focal inflammatory changes are evident in the abdomen or pelvis. No focally inflamed or irregular bowel is evident to identified location of a perforated viscus. There are unremarkable appearances of the colon. The stomach is remarkable only for presence of the percutaneous gastrostomy. There is good contrast distention of the stomach and duodenum with no evidence of contrast extravasation. Small bowel is unremarkable.  There are normal appearances of the liver, gallbladder, bile ducts, pancreas, spleen, adrenals and kidneys.  The abdominal aorta is normal in caliber with moderate atherosclerotic calcification.  No significant skeletal lesions are evident.  IMPRESSION: 1. Large volume free intraperitoneal air. No focal abnormality is evident to identify a perforated viscus. 2. There is subcutaneous air in the left upper quadrant and in the lower left chest. There also are 2 air bubbles tracking along the percutaneous gastrostomy tube within the anterior abdominal wall. This suggests the possibility of the percutaneous gastrostomy being the etiology of the free air, but it is not conclusive. The gastrostomy device itself appears to be satisfactorily positioned. 3. In the chest, the left mainstem bronchial stent appears to be satisfactorily positioned. There is new  consolidation of left lower lobe basal segments. There is worsened esophageal dilatation with air-fluid level, probably due to compromise of the esophagus by the sub- carinal mass. There is slight interval enlargement of a moderate right pleural effusion.   Electronically Signed   By: Andreas Newport M.D.   On: 08/05/2014 21:11   Ir Gastrostomy Tube Mod Sed  07/27/2014   CLINICAL DATA:  75 year old male with a history of dysphagia/strictures. He has been referred for percutaneous gastrostomy placement.  EXAM: PERCUTANEOUS GASTROSTOMY  FLUOROSCOPY TIME:  2 minutes 30 seconds minutes  MEDICATIONS AND MEDICAL HISTORY: Versed 2.5 mg, Fentanyl 125 mcg.  2 g Ancef  ANESTHESIA/SEDATION: Moderate sedation time: 27 minutes  CONTRAST:  25 cc Omni 300 enteric  PROCEDURE: The procedure, risks, benefits, and alternatives were explained to the patient. Questions regarding the procedure were encouraged and answered. The patient understands and consents to the procedure.  The epigastrium was prepped with Betadine in a sterile fashion, and a sterile drape was applied covering the operative field. A sterile gown and sterile gloves were used for the procedure.  A 5-French orogastric tube is placed under fluoroscopic guidance. Scout imaging of the abdomen confirms barium within the transverse colon.  The stomach was distended with gas. Under fluoroscopic guidance, an 18 gauge needle was utilized to puncture the anterior wall of the body of the stomach. An Amplatz wire was advanced through the needle passing a T fastener into the lumen of the stomach. The T fastener was secured for gastropexy. A second T fastener was deployed using an 18 gauge needle adjacent to the first deployment.  Serial dilation was performed over the Amplatz wire at the location of the first puncture, with placement of a 20 French peel-away sheath. An 18 gauge balloon retention gastrostomy tube was then placed through the peel-away sheath over the wire. Once  we confirmed position, the wire and peel-away sheath were removed. Contrast infused through the tube confirmed position and  AP and lateral position.  The patient tolerated the procedure well and remained hemodynamically stable throughout.  No complications were encountered and no significant blood loss was encountered.  COMPLICATIONS: None  FINDINGS: The image demonstrates placement of a 20-French balloon retention gastrostomy tube into the body of the stomach.  IMPRESSION: Status post placement of 20 French balloon retention gastrostomy.  Signed,  Dulcy Fanny. Earleen Newport DO  [facility]  [facility]  PLAN: Decompression for 24 hours on low wall suction.  The tube may be used starting after 24 hours.   Electronically Signed   By: Corrie Mckusick D.O.   On: 07/27/2014 17:49   Dg Chest Port 1 View  07/14/2014   CLINICAL DATA:  Worsening shortness of breath and chest pain. Currently on chemotherapy for right lower lobe lung carcinoma.  EXAM: PORTABLE CHEST - 1 VIEW  COMPARISON:  06/10/2014  FINDINGS: Right-sided power port remains in appropriate position. Right infrahilar mass like opacity shows no significant change allowing for differences in positioning. Pleural- parenchymal scarring at right lung base appears stable. No new areas of pulmonary opacity are identified. No evidence of pneumothorax or pleural effusion. Heart size is within normal limits.  IMPRESSION: No significant change in right infrahilar masslike opacity and right basilar scarring.   Electronically Signed   By: Earle Gell M.D.   On: 07/14/2014 19:33   Dg Abd Acute W/chest  08/05/2014   CLINICAL DATA:  Air in the abdomen. Patient had a stent placed in the left lung this morning. History of lung cancer.  EXAM: DG ABDOMEN ACUTE W/ 1V CHEST  COMPARISON:  July 24, 2014  FINDINGS: There is a small right pleural effusion enlarged compared to the prior exam. Prominence of right hilum is identified unchanged. The  left lung is clear. The heart size is normal. Right central venous line is unchanged.  There is free air. Focal dilated bowel loop is noted in the lower abdomen/pelvis. Radiopaque needle like densities are projected over the abdomen. A PEG tube is identified.  IMPRESSION: Free air. Further evaluation with CT of abdomen pelvis is recommended.  These results will be called to the ordering clinician or representative by the Radiologist Assistant, and communication documented in the PACS or zVision Dashboard.   Electronically Signed   By: Abelardo Diesel M.D.   On: 08/05/2014 18:16    Labs:  CBC:  Recent Labs  07/25/14 0600 07/27/14 0520 08/05/14 1825 08/06/14 0430  WBC 12.7* 12.3* 12.1* 11.4*  HGB 10.0* 10.6* 10.4* 9.6*  HCT 32.3* 33.9* 32.9* 30.2*  PLT 414* 463* 613* 579*    COAGS:  Recent Labs  11/16/13 0755 01/28/14 1725  INR 1.10 CANCELED  APTT 35 CANCELED    BMP:  Recent Labs  07/25/14 0600 07/27/14 0520 08/05/14 1825 08/06/14 0430  NA 136 134* 133* 134*  K 4.0 3.8 3.6 3.5  CL 103 100* 94* 98*  CO2 '27 25 29 29  '$ GLUCOSE 106* 99 168* 153*  BUN '17 10 10 11  '$ CALCIUM 8.2* 8.8* 9.5 8.8*  CREATININE 0.67 0.62 0.57* 0.56*  GFRNONAA >60 >60 >60 >60  GFRAA >60 >60 >60 >60    LIVER FUNCTION TESTS:  Recent Labs  07/15/14 0206 07/25/14 0600 08/05/14 1825 08/06/14 0430  BILITOT 0.5 0.5 0.4 0.3  AST 17 13* 24 20  ALT 19 19 38 30  ALKPHOS 75 53 97 85  PROT 7.0 6.0* 6.7 6.0*  ALBUMIN 3.0* 2.5* 2.5* 2.2*  TUMOR MARKERS: No results for input(s): AFPTM, CEA, CA199, CHROMGRNA in the last 8760 hours.  Assessment and Plan:  74 year old male with a history of pneumoperitoneum discovered incidentally after left bronchial stent placed at [facility], admitted for work-up.  VIR placed percutaneous G-tube on 07/27/2014, which is shown in good position on a CT abdomen from 08/05/2014.  No evidence on CT of other organ injury.    Suspect that the pneumoperitoneum is secondary to  spontaneous decompression of the stomach gas into peritoneum.    Discussed with surgery.  Agree with current G-tube vent/decompression.    Plan is for fluoroscopic study on Monday to evaluate the function/placement of the g-tube.    Agree that improving nutrition status will improve chance of sealing the ostomy to the peritoneum.     SignedCorrie Mckusick 08/07/2014, 2:17 PM   I spent a total of 20 Minutes    in face to face in clinical consultation, greater than 50% of which was counseling/coordinating care for gastrostomy tube care, pneumoperitoneum.

## 2014-08-07 NOTE — Progress Notes (Signed)
General Surgery Note  LOS: 2 days  POD -     Assessment/Plan: 1. Pneumoperitoneum  Probably secondary to G tube  On Zosyn  Now NPO since 7/28 (he was readmitted here Thursday night)  1A.  G tube placed by IR - J. Wagner on 07/27/2014  This was placed because the 5.5 cm subcarinal mass appears to be impinging on the esophagus and obstructing it.  It looks like Dr. Johney Maine spoke with Dr. Annamaria Boots on 7/28, but I don't see where radiology has seen the patient.  I have tried to call radiology today, but I am having trouble getting through.  2.  Metastatic non small cell lung cancer - sees Dr. Mayme Genta  Left mainstem bronchial stent - this was placed at [facility] consolidation of left lower lobe  3.  Esophageal dilitation on CT scan - secondary to sub carinal mass.  4.  A. Fib 5.  DM 6.  Anemia  Hgb - 9.6 on 08/06/2014 7.  Right pleural effusion  Principal Problem:   Pneumoperitoneum of unknown etiology Active Problems:   Primary cancer of right lower lobe of lung   PAF- on Flecainide,    Wheezing   Pleural effusion, right   Dysphagia, pharyngoesophageal phase   Dyspnea   Malnutrition of moderate degree   Erosive esophagitis on recent endoscopy   Atrial fibrillation with rapid ventricular response   Odynophagia   Esophageal stricture   Gastrostomy in place   Pneumoperitoneum   Protein-calorie malnutrition, severe   Subjective:  Actually looks better than expected.  Complains of some left chest pain that predates the g tube placement. Objective:   Filed Vitals:   08/07/14 0700  BP: 136/80  Pulse: 129  Temp:   Resp: 21     Intake/Output from previous day:  07/29 0701 - 07/30 0700 In: 1761.6 [I.V.:1611.6; IV Piggyback:150] Out: 1435 [Urine:1235; Drains:200]  Intake/Output this shift:  Total I/O In: 118.3 [I.V.:68.3; IV Piggyback:50] Out: 125 [Urine:125]   Physical Exam:   General: WN older WM who is alert and oriented.    HEENT: Normal. Pupils equal. .   Lungs:  Decreased right breath sounds, rhonchi on both sides   Abdomen: Soft   Wound: LUQ gastrostomy tube with necrosis and drainage.     Lab Results:    Recent Labs  08/05/14 1825 08/06/14 0430  WBC 12.1* 11.4*  HGB 10.4* 9.6*  HCT 32.9* 30.2*  PLT 613* 579*    BMET   Recent Labs  08/05/14 1825 08/06/14 0430  NA 133* 134*  K 3.6 3.5  CL 94* 98*  CO2 29 29  GLUCOSE 168* 153*  BUN 10 11  CREATININE 0.57* 0.56*  CALCIUM 9.5 8.8*    PT/INR  No results for input(s): LABPROT, INR in the last 72 hours.  ABG  No results for input(s): PHART, HCO3 in the last 72 hours.  Invalid input(s): PCO2, PO2   Studies/Results:  Ct Chest W Contrast  08/05/2014   CLINICAL DATA:  Left mainstem bronchus stent placed this morning. Was found to have free intraperitoneal air and was sent to the emergency room.  EXAM: CT CHEST, ABDOMEN, AND PELVIS WITH CONTRAST  TECHNIQUE: Multidetector CT imaging of the chest, abdomen and pelvis was performed following the standard protocol during bolus administration of intravenous contrast.  CONTRAST:  36m OMNIPAQUE IOHEXOL 300 MG/ML SOLN, 1048mOMNIPAQUE IOHEXOL 300 MG/ML SOLN  COMPARISON:  07/14/2014  FINDINGS: CT CHEST FINDINGS  The left mainstem bronchus stent appears  well-positioned. Endo stent lumen is widely patent. There is no significant change in the peripherally enhancing centrally hypodense 5.5 cm sub- carinal mass. There is no significant change in the central right lower lobe peripherally enhancing 4.7 x 7.2 cm mass. There is a moderate right pleural effusion which is marginally larger than on 07/14/2014. There is consolidation and air bronchograms in the posterior basal segment of the left lower lobe, new, and there is a lesser degree of consolidation and volume loss in the lateral basal segment. There is a small pericardial effusion, unchanged. There is mild esophageal dilatation with an air-fluid level and this appears a little worsened. It may be due to  compromise to the esophagus by the subcarinal mass.  There is no pneumothorax or pneumomediastinum.  CT ABDOMEN AND PELVIS FINDINGS  There is a large volume free intraperitoneal air. Most of the air is collected anteriorly over the liver and around the stomach. There also is a small volume of subcutaneous air in the left upper quadrant, above and below the percutaneous gastrostomy but not directly around it. On sagittal image 78, series 7 there are 2 air bubbles tracking alongside the percutaneous gastrostomy tube in the abdominal wall musculature.  No focal inflammatory changes are evident in the abdomen or pelvis. No focally inflamed or irregular bowel is evident to identified location of a perforated viscus. There are unremarkable appearances of the colon. The stomach is remarkable only for presence of the percutaneous gastrostomy. There is good contrast distention of the stomach and duodenum with no evidence of contrast extravasation. Small bowel is unremarkable.  There are normal appearances of the liver, gallbladder, bile ducts, pancreas, spleen, adrenals and kidneys.  The abdominal aorta is normal in caliber with moderate atherosclerotic calcification.  No significant skeletal lesions are evident.  IMPRESSION: 1. Large volume free intraperitoneal air. No focal abnormality is evident to identify a perforated viscus. 2. There is subcutaneous air in the left upper quadrant and in the lower left chest. There also are 2 air bubbles tracking along the percutaneous gastrostomy tube within the anterior abdominal wall. This suggests the possibility of the percutaneous gastrostomy being the etiology of the free air, but it is not conclusive. The gastrostomy device itself appears to be satisfactorily positioned. 3. In the chest, the left mainstem bronchial stent appears to be satisfactorily positioned. There is new consolidation of left lower lobe basal segments. There is worsened esophageal dilatation with air-fluid  level, probably due to compromise of the esophagus by the sub- carinal mass. There is slight interval enlargement of a moderate right pleural effusion.   Electronically Signed   By: Andreas Newport M.D.   On: 08/05/2014 21:11   Ct Abdomen Pelvis W Contrast  08/05/2014   CLINICAL DATA:  Left mainstem bronchus stent placed this morning. Was found to have free intraperitoneal air and was sent to the emergency room.  EXAM: CT CHEST, ABDOMEN, AND PELVIS WITH CONTRAST  TECHNIQUE: Multidetector CT imaging of the chest, abdomen and pelvis was performed following the standard protocol during bolus administration of intravenous contrast.  CONTRAST:  55m OMNIPAQUE IOHEXOL 300 MG/ML SOLN, 1023mOMNIPAQUE IOHEXOL 300 MG/ML SOLN  COMPARISON:  07/14/2014  FINDINGS: CT CHEST FINDINGS  The left mainstem bronchus stent appears well-positioned. Endo stent lumen is widely patent. There is no significant change in the peripherally enhancing centrally hypodense 5.5 cm sub- carinal mass. There is no significant change in the central right lower lobe peripherally enhancing 4.7 x 7.2 cm mass. There is  a moderate right pleural effusion which is marginally larger than on 07/14/2014. There is consolidation and air bronchograms in the posterior basal segment of the left lower lobe, new, and there is a lesser degree of consolidation and volume loss in the lateral basal segment. There is a small pericardial effusion, unchanged. There is mild esophageal dilatation with an air-fluid level and this appears a little worsened. It may be due to compromise to the esophagus by the subcarinal mass.  There is no pneumothorax or pneumomediastinum.  CT ABDOMEN AND PELVIS FINDINGS  There is a large volume free intraperitoneal air. Most of the air is collected anteriorly over the liver and around the stomach. There also is a small volume of subcutaneous air in the left upper quadrant, above and below the percutaneous gastrostomy but not directly around  it. On sagittal image 78, series 7 there are 2 air bubbles tracking alongside the percutaneous gastrostomy tube in the abdominal wall musculature.  No focal inflammatory changes are evident in the abdomen or pelvis. No focally inflamed or irregular bowel is evident to identified location of a perforated viscus. There are unremarkable appearances of the colon. The stomach is remarkable only for presence of the percutaneous gastrostomy. There is good contrast distention of the stomach and duodenum with no evidence of contrast extravasation. Small bowel is unremarkable.  There are normal appearances of the liver, gallbladder, bile ducts, pancreas, spleen, adrenals and kidneys.  The abdominal aorta is normal in caliber with moderate atherosclerotic calcification.  No significant skeletal lesions are evident.  IMPRESSION: 1. Large volume free intraperitoneal air. No focal abnormality is evident to identify a perforated viscus. 2. There is subcutaneous air in the left upper quadrant and in the lower left chest. There also are 2 air bubbles tracking along the percutaneous gastrostomy tube within the anterior abdominal wall. This suggests the possibility of the percutaneous gastrostomy being the etiology of the free air, but it is not conclusive. The gastrostomy device itself appears to be satisfactorily positioned. 3. In the chest, the left mainstem bronchial stent appears to be satisfactorily positioned. There is new consolidation of left lower lobe basal segments. There is worsened esophageal dilatation with air-fluid level, probably due to compromise of the esophagus by the sub- carinal mass. There is slight interval enlargement of a moderate right pleural effusion.   Electronically Signed   By: Andreas Newport M.D.   On: 08/05/2014 21:11   Dg Abd Acute W/chest  08/05/2014   CLINICAL DATA:  Air in the abdomen. Patient had a stent placed in the left lung this morning. History of lung cancer.  EXAM: DG ABDOMEN ACUTE  W/ 1V CHEST  COMPARISON:  July 24, 2014  FINDINGS: There is a small right pleural effusion enlarged compared to the prior exam. Prominence of right hilum is identified unchanged. The left lung is clear. The heart size is normal. Right central venous line is unchanged.  There is free air. Focal dilated bowel loop is noted in the lower abdomen/pelvis. Radiopaque needle like densities are projected over the abdomen. A PEG tube is identified.  IMPRESSION: Free air. Further evaluation with CT of abdomen pelvis is recommended.  These results will be called to the ordering clinician or representative by the Radiologist Assistant, and communication documented in the PACS or zVision Dashboard.   Electronically Signed   By: Abelardo Diesel M.D.   On: 08/05/2014 18:16     Anti-infectives:   Anti-infectives    Start     Dose/Rate  Route Frequency Ordered Stop   08/06/14 1800  piperacillin-tazobactam (ZOSYN) IVPB 3.375 g     3.375 g 100 mL/hr over 30 Minutes Intravenous 4 times per day 08/06/14 1621     08/06/14 0200  piperacillin-tazobactam (ZOSYN) IVPB 3.375 g  Status:  Discontinued     3.375 g 12.5 mL/hr over 240 Minutes Intravenous Every 8 hours 08/06/14 0022 08/06/14 1621   08/05/14 1830  piperacillin-tazobactam (ZOSYN) IVPB 3.375 g     3.375 g 100 mL/hr over 30 Minutes Intravenous  Once 08/05/14 1818 08/05/14 1926      Alphonsa Overall, MD, FACS Pager: [place] [facility] Office: [phone] 08/07/2014

## 2014-08-07 NOTE — Plan of Care (Signed)
Problem: Consults Goal: Diabetes Guidelines if Diabetic/Glucose > 140 If diabetic or lab glucose is > 140 mg/dl - Initiate Diabetes/Hyperglycemia Guidelines & Document Interventions  Outcome: Not Applicable Date Met:  75/73/22 Pt without h/o DM but getting q4 CBG to monitor.  Not needing SS insulin today.

## 2014-08-08 LAB — GLUCOSE, CAPILLARY
GLUCOSE-CAPILLARY: 83 mg/dL (ref 65–99)
Glucose-Capillary: 85 mg/dL (ref 65–99)
Glucose-Capillary: 87 mg/dL (ref 65–99)
Glucose-Capillary: 89 mg/dL (ref 65–99)
Glucose-Capillary: 93 mg/dL (ref 65–99)
Glucose-Capillary: 97 mg/dL (ref 65–99)

## 2014-08-08 MED ORDER — HYDROMORPHONE HCL 2 MG/ML IJ SOLN
1.0000 mg | INTRAMUSCULAR | Status: DC | PRN
  Administered 2014-08-08 (×6): 1 mg via INTRAVENOUS
  Filled 2014-08-08 (×6): qty 1

## 2014-08-08 MED ORDER — PIPERACILLIN-TAZOBACTAM 3.375 G IVPB
3.3750 g | Freq: Three times a day (TID) | INTRAVENOUS | Status: DC
  Administered 2014-08-08 – 2014-08-10 (×6): 3.375 g via INTRAVENOUS
  Filled 2014-08-08 (×7): qty 50

## 2014-08-08 MED ORDER — HYDROMORPHONE HCL 1 MG/ML IJ SOLN
1.0000 mg | INTRAMUSCULAR | Status: DC | PRN
  Administered 2014-08-09 (×5): 1 mg via INTRAVENOUS
  Filled 2014-08-08 (×6): qty 1

## 2014-08-08 NOTE — Care Management Important Message (Signed)
Important Message  Patient Details  Name: Steve [name] MRN: [id number] Date of Birth: Aug 30, 1940   Medicare Important Message Given:  Yes-second notification given    Apolonio Schneiders, RN 08/08/2014, 3:12 PM

## 2014-08-08 NOTE — Progress Notes (Signed)
Patient ID: Steve PAREDEZ, male   DOB: 08-02-1940, 74 y.o.   MRN: [id number] [facility] PROGRESS NOTE  DOSSIE SWOR WVP:[id number] DOB: [birth year] DOA: 08/05/2014 PCP: [name], MD  Oncology: Dr. Curt Bears  Brief narrative:    74 year old man with0 a history of atrial fibrillation, dyslipidemia, metastatic non-small cell lung cancer (initially diagnosed in 12/2012, status post immunotherapy started 09/2013 (has completed 12 cycles), on systemic chemotherapy with gemcitabine, hospitalized 07/24/14 through 07/29/2014 for odynophagia and dysphagia thought to be from extrinsic compression from tumor not amenable to esophageal stenting. Due to poor nutritional status, PEG tube was placed 07/27/2014 by IR. He underwent stent placement to right bronchus at [facility] earlier on the day of this admission and on the way to [facility] was called that he has free air on x-ray and to go to ED immediately.  Patient reported chronic abdominal pain and leakage around PEG tube. No associated nausea or vomiting. No fevers.   In ED, his BP was 85/66, HR 79-113, RR 5-23, T max 98.7 F and oxygen saturation 92-100% on [place] oxygen support. Blood work revealed WBC count 12.1, hemoglobin 10.4, platelets 613, normal creatinine. Plain abd films showed free air. CT abd demonstrated large volume free intraperitoneal air, subcutaneous air in the left upper quadrant and in the lower left chest. There also are 2 air bubbles tracking along the percutaneous gastrostomy tube within the anterior abdominal wall which suggests the possibility of the percutaneous gastrostomy being the etiology of the free air. In the chest, the left mainstem bronchial stent appears to be satisfactorily positioned. There is new consolidation of left lower lobe basal segments. There is also worsened esophageal dilatation with air-fluid level, probably due to compromise of the esophagus by the sub- carinal mass.  Because he was found to  have a fib with RVR he was started on Cardizem drip and admitted to SDU. Surgery was consulted about the findings of free air on abd x ray and CT.  Barrier to discharge: Transferred to telemetry 08/07/2014. Plan for fluoroscopic study on Monday to evaluate the function / placement of G-tube.  Assessment/Plan:    Principal Problem: Pneumoperitoneum / Leukocytosis  - CT abdomen demonstrated that PEG tube is possibly an etiology of pneumoperitoneum because 2 air bubbles noted tracking along the PEG tube within anterior abdominal wall - Patient had G-tube placed because the 5.5 cm subcarinal mass appears to be impinging on the esophagus and obstructing it. - IR has seen the pt in consultation. Agrees that pneumoperitoneum is likely secondary to spontaneous decompression of the stomach gas into peritoneum. - Plan for fluoroscopic study on Monday to evaluate the function / placement of G-tube - Continue zosyn per surgery recommendations   Active Problems: Metastatic non-small cell lung cancer: - On systemic chemotherapy with single agent gemcitabine due to progression of the disease  Atrial fibrillation with RVR - CHADS2 vasc score at least 3 - Converted to sinus rhythm 7/30 for which reason we switched to low dose metoprolol 2.5 mg IV Q 6 hours. Cardizem drip stopped 08/07/2014. - Continue rate control with flecainide  - Continue daily aspirin  Dysphagia / Odynophagia - Fom extrinsic compression and esophagitis - Can tolerate small sips of water  Anemia in neoplastic disease - Hemoglobin stable  - No current indications for transfusion   Protein-calorie malnutrition, severe - In the context of chronic illness - Nutrition consulted   DVT Prophylaxis  - SCD's bilaterally    Code Status:  DNR/DNI Family Communication:  plan of care discussed with the patient Disposition Plan: Plan for fluoroscopic study on Monday to evaluate the function / placement of G-tube  IV access:   PAC  Procedures and diagnostic studies:    Ct Chest W Contrast 08/05/2014  1. Large volume free intraperitoneal air. No focal abnormality is evident to identify a perforated viscus. 2. There is subcutaneous air in the left upper quadrant and in the lower left chest. There also are 2 air bubbles tracking along the percutaneous gastrostomy tube within the anterior abdominal wall. This suggests the possibility of the percutaneous gastrostomy being the etiology of the free air, but it is not conclusive. The gastrostomy device itself appears to be satisfactorily positioned. 3. In the chest, the left mainstem bronchial stent appears to be satisfactorily positioned. There is new consolidation of left lower lobe basal segments. There is worsened esophageal dilatation with air-fluid level, probably due to compromise of the esophagus by the sub- carinal mass. There is slight interval enlargement of a moderate right pleural effusion.   Electronically Signed   By: Andreas Newport M.D.   On: 08/05/2014 21:11   Ct Abdomen Pelvis W Contrast 08/05/2014  1. Large volume free intraperitoneal air. No focal abnormality is evident to identify a perforated viscus. 2. There is subcutaneous air in the left upper quadrant and in the lower left chest. There also are 2 air bubbles tracking along the percutaneous gastrostomy tube within the anterior abdominal wall. This suggests the possibility of the percutaneous gastrostomy being the etiology of the free air, but it is not conclusive. The gastrostomy device itself appears to be satisfactorily positioned. 3. In the chest, the left mainstem bronchial stent appears to be satisfactorily positioned. There is new consolidation of left lower lobe basal segments. There is worsened esophageal dilatation with air-fluid level, probably due to compromise of the esophagus by the sub- carinal mass. There is slight interval enlargement of a moderate right pleural effusion.   Electronically Signed    By: Andreas Newport M.D.   On: 08/05/2014 21:11   Dg Abd Acute W/chest 08/05/2014   Free air. Further evaluation with CT of abdomen pelvis is recommended.    Medical Consultants:  Surgery Interventional radiology   Other Consultants:  Nutrition Physical therapy  IAnti-Infectives:   Zosyn 08/05/2014 -->    Leisa Lenz, MD  [facility] Pager [phone]  Time spent in minutes: 25 minutes  If 7PM-7AM, please contact night-coverage [email or web address] Password [facility] 08/08/2014, 7:57 AM   LOS: 3 days    HPI/Subjective: No acute overnight events. Patient reports no nausea.   Objective: Filed Vitals:   08/07/14 1953 08/07/14 2006 08/07/14 2030 08/08/14 0428  BP: 142/79  122/79 157/94  Pulse: 113  110 117  Temp:   97.8 F (36.6 C) 98.4 F (36.9 C)  TempSrc:   Oral Oral  Resp:   20 20  Height:   '5\' 11"'$  (1.803 m)   Weight:      SpO2:  95% 100% 92%    Intake/Output Summary (Last 24 hours) at 08/08/14 0757 Last data filed at 08/08/14 0600  Gross per 24 hour  Intake 2124.58 ml  Output    900 ml  Net 1224.58 ml    Exam:   General:  Pt is alert, not in acute distress  Cardiovascular: tachycardic, S1, S2 (+)  Respiratory: no wheezing, coughing with deep inspiration   Abdomen: PEG tube in place, BS appreciated   Extremities: No leg swelling, pulses  palpable   Neuro: Nonfocal  Data Reviewed: Basic Metabolic Panel:  Recent Labs Lab 08/05/14 1825 08/06/14 0430  NA 133* 134*  K 3.6 3.5  CL 94* 98*  CO2 29 29  GLUCOSE 168* 153*  BUN 10 11  CREATININE 0.57* 0.56*  CALCIUM 9.5 8.8*   Liver Function Tests:  Recent Labs Lab 08/05/14 1825 08/06/14 0430  AST 24 20  ALT 38 30  ALKPHOS 97 85  BILITOT 0.4 0.3  PROT 6.7 6.0*  ALBUMIN 2.5* 2.2*   No results for input(s): LIPASE, AMYLASE in the last 168 hours. No results for input(s): AMMONIA in the last 168 hours. CBC:  Recent Labs Lab 08/05/14 1825 08/06/14 0430  WBC 12.1* 11.4*  NEUTROABS  11.0*  --   HGB 10.4* 9.6*  HCT 32.9* 30.2*  MCV 90.4 90.7  PLT 613* 579*   Cardiac Enzymes: No results for input(s): CKTOTAL, CKMB, CKMBINDEX, TROPONINI in the last 168 hours. BNP: Invalid input(s): POCBNP CBG:  Recent Labs Lab 08/07/14 1233 08/07/14 1653 08/07/14 2028 08/07/14 2352 08/08/14 0426  GLUCAP 117* 101* 106* 90 93    Recent Results (from the past 240 hour(s))  MRSA PCR Screening     Status: None   Collection Time: 08/06/14  2:34 AM  Result Value Ref Range Status   MRSA by PCR NEGATIVE NEGATIVE Final    Comment:        The GeneXpert MRSA Assay (FDA approved for NASAL specimens only), is one component of a comprehensive MRSA colonization surveillance program. It is not intended to diagnose MRSA infection nor to guide or monitor treatment for MRSA infections.      Scheduled Meds: . albuterol  3 mL Inhalation BID  . antiseptic oral rinse  7 mL Mouth Rinse q12n4p  . chlorhexidine  15 mL Mouth Rinse BID  . flecainide  100 mg Per Tube BID  . insulin aspart  0-15 Units Subcutaneous TID WC  . metoprolol  2.5 mg Intravenous 4 times per day  . piperacillin-tazobactam (ZOSYN)  IV  3.375 g Intravenous 4 times per day   Continuous Infusions: . dextrose 5 % and 0.9% NaCl 75 mL/hr at 08/06/14 0800

## 2014-08-08 NOTE — Progress Notes (Signed)
General Surgery Note  LOS: 3 days  POD -     Assessment/Plan: 1. Pneumoperitoneum  Probably secondary to G tube  On Zosyn  Now NPO since 7/28 - he looks good.  Plan to study the tube tomorrow.  If it is okay - will start tube feeds.  1A.  G tube placed by IR - J. Wagner on 07/27/2014  This was placed because the 5.5 cm subcarinal mass appears to be impinging on the esophagus and obstructing it.  Discussed with Dr. Earleen Newport yesterday.  Lennette Bihari is at the beside right now.    2.  Metastatic non small cell lung cancer - sees Dr. Mayme Genta  Left mainstem bronchial stent - this was placed at [facility] consolidation of left lower lobe  3.  Esophageal dilitation on CT scan - secondary to sub carinal mass.  4.  A. Fib 5.  DM 6.  Anemia  Hgb - 9.6 on 08/06/2014 7.  Right pleural effusion  Principal Problem:   Pneumoperitoneum of unknown etiology Active Problems:   Primary cancer of right lower lobe of lung   PAF- on Flecainide,    Wheezing   Pleural effusion, right   Dysphagia, pharyngoesophageal phase   Dyspnea   Malnutrition of moderate degree   Erosive esophagitis on recent endoscopy   Atrial fibrillation with rapid ventricular response   Odynophagia   Esophageal stricture   Gastrostomy in place   Pneumoperitoneum   Protein-calorie malnutrition, severe   Subjective:  Looks good.  No abdominal pain outside of around the G tube.   Wife at bedside.  Objective:   Filed Vitals:   08/08/14 0428  BP: 157/94  Pulse: 117  Temp: 98.4 F (36.9 C)  Resp: 20     Intake/Output from previous day:  07/30 0701 - 07/31 0700 In: 2242.8 [P.O.:180; I.V.:1812.8; IV Piggyback:250] Out: 1025 [Urine:825; Drains:200]  Intake/Output this shift:  Total I/O In: -  Out: 200 [Urine:200]   Physical Exam:   General: WN older WM who is alert and oriented.    HEENT: Normal. Pupils equal. .   Lungs: Decreased right breath sounds, rhonchi on both sides   Abdomen: Soft   Wound: LUQ  gastrostomy tube.  It is cleaner around the g tube today.  Has a T retention suture that Lennette Bihari will remove.     Lab Results:     Recent Labs  08/05/14 1825 08/06/14 0430  WBC 12.1* 11.4*  HGB 10.4* 9.6*  HCT 32.9* 30.2*  PLT 613* 579*    BMET    Recent Labs  08/05/14 1825 08/06/14 0430  NA 133* 134*  K 3.6 3.5  CL 94* 98*  CO2 29 29  GLUCOSE 168* 153*  BUN 10 11  CREATININE 0.57* 0.56*  CALCIUM 9.5 8.8*    PT/INR  No results for input(s): LABPROT, INR in the last 72 hours.  ABG  No results for input(s): PHART, HCO3 in the last 72 hours.  Invalid input(s): PCO2, PO2   Studies/Results:  No results found.   Anti-infectives:   Anti-infectives    Start     Dose/Rate Route Frequency Ordered Stop   08/08/14 1400  piperacillin-tazobactam (ZOSYN) IVPB 3.375 g     3.375 g 12.5 mL/hr over 240 Minutes Intravenous 3 times per day 08/08/14 1135     08/06/14 1800  piperacillin-tazobactam (ZOSYN) IVPB 3.375 g  Status:  Discontinued     3.375 g 100 mL/hr over 30 Minutes Intravenous 4 times per day  08/06/14 1621 08/08/14 1135   08/06/14 0200  piperacillin-tazobactam (ZOSYN) IVPB 3.375 g  Status:  Discontinued     3.375 g 12.5 mL/hr over 240 Minutes Intravenous Every 8 hours 08/06/14 0022 08/06/14 1621   08/05/14 1830  piperacillin-tazobactam (ZOSYN) IVPB 3.375 g     3.375 g 100 mL/hr over 30 Minutes Intravenous  Once 08/05/14 1818 08/05/14 1926      Alphonsa Overall, MD, FACS Pager: [facility] Office: [phone] 08/08/2014

## 2014-08-08 NOTE — Progress Notes (Signed)
Patient ID: Steve [name], male   DOB: [birth year], 74 y.o.   MRN: [id number]    Referring Physician(s): CCS  Chief Complaint:  pneumoperitoneum  Subjective:  Pt without sig abd or back pain,N/V; eager to go home  Allergies: Morphine and Morphine and related  Medications: Prior to Admission medications   Medication Sig Start Date End Date Taking? Authorizing Provider  albuterol (PROVENTIL) (5 MG/ML) 0.5% nebulizer solution Inhale 0.5 mLs into the lungs 2 (two) times daily. 08/05/14 11/03/14 Yes Historical Provider, MD  aspirin 81 MG tablet Place 1 tablet (81 mg total) into feeding tube daily. 07/29/14  Yes Velvet Bathe, MD  diltiazem (CARDIZEM) 90 MG tablet Take 1 tablet (90 mg total) by mouth every 6 (six) hours. 07/30/14  Yes Fay Records, MD  flecainide (TAMBOCOR) 100 MG tablet Place 1 tablet (100 mg total) into feeding tube 2 (two) times daily. 07/30/14  Yes Velvet Bathe, MD  guaiFENesin (MUCINEX) 600 MG 12 hr tablet Take 600 mg by mouth every 12 (twelve) hours as needed for cough.   Yes Historical Provider, MD  lactose free nutrition (BOOST PLUS) LIQD Take 237 mLs by mouth 2 (two) times daily between meals. 07/29/14  Yes Velvet Bathe, MD  lidocaine (XYLOCAINE) 2 % solution Use as directed 20 mLs in the mouth or throat every 6 (six) hours as needed for mouth pain. 06/22/14  Yes Maryanna Shape, NP  Nutritional Supplements (FEEDING SUPPLEMENT, OSMOLITE 1.5 CAL,) LIQD Place 237 mLs into feeding tube 4 (four) times daily. 07/29/14  Yes Velvet Bathe, MD  omeprazole (PRILOSEC) 40 MG capsule Take 40 mg by mouth 2 (two) times daily.  07/22/14  Yes Historical Provider, MD  oxyCODONE (ROXICODONE) 5 MG/5ML solution Take 5 mLs (5 mg total) by mouth every 4 (four) hours as needed for moderate pain. 07/29/14  Yes Velvet Bathe, MD  polyethylene glycol (MIRALAX / GLYCOLAX) packet Take 17 g by mouth daily as needed for mild constipation or moderate constipation.    Yes Historical Provider, MD  simvastatin  (ZOCOR) 20 MG tablet Place 1 tablet (20 mg total) into feeding tube daily. Take one tablet by mouth once daily to lower cholesterol 07/29/14  Yes Velvet Bathe, MD  sodium chloride HYPERTONIC 3 % nebulizer solution Inhale 3 mLs into the lungs 2 (two) times daily. 08/05/14 11/03/14 Yes Historical Provider, MD  temazepam (RESTORIL) 30 MG capsule Place 1 capsule (30 mg total) into feeding tube at bedtime as needed for sleep. 07/29/14  Yes Velvet Bathe, MD  Water For Irrigation, Sterile (FREE WATER) SOLN Place 120 mLs into feeding tube 4 (four) times daily. 07/29/14  Yes Velvet Bathe, MD  Amino Acids-Protein Hydrolys (FEEDING SUPPLEMENT, PRO-STAT SUGAR FREE 64,) LIQD Place 30 mLs into feeding tube 2 (two) times daily. Patient not taking: Reported on 08/05/2014 07/29/14   Velvet Bathe, MD  pantoprazole sodium (PROTONIX) 40 mg/20 mL PACK Place 40 mLs (80 mg total) into feeding tube daily. Patient not taking: Reported on 08/05/2014 07/17/14   Hosie Poisson, MD     Vital Signs: BP 157/94 mmHg  Pulse 117  Temp(Src) 98.4 F (36.9 C) (Oral)  Resp 20  Ht '5\' 11"'$  (1.803 m)  Wt 164 lb 14.5 oz (74.8 kg)  BMI 23.01 kg/m2  SpO2 93%  Physical Exam pt awake/alert; G tube intact but outer disk a few cm off skin surface and loose T tack noted; tube to suction; disk made taut to skin and T tack suture removed; small amt drainage noted  on gauze dressing- area cleansed  Imaging: Ct Chest W Contrast  08/05/2014   CLINICAL DATA:  Left mainstem bronchus stent placed this morning. Was found to have free intraperitoneal air and was sent to the emergency room.  EXAM: CT CHEST, ABDOMEN, AND PELVIS WITH CONTRAST  TECHNIQUE: Multidetector CT imaging of the chest, abdomen and pelvis was performed following the standard protocol during bolus administration of intravenous contrast.  CONTRAST:  67m OMNIPAQUE IOHEXOL 300 MG/ML SOLN, 1092mOMNIPAQUE IOHEXOL 300 MG/ML SOLN  COMPARISON:  07/14/2014  FINDINGS: CT CHEST FINDINGS  The left  mainstem bronchus stent appears well-positioned. Endo stent lumen is widely patent. There is no significant change in the peripherally enhancing centrally hypodense 5.5 cm sub- carinal mass. There is no significant change in the central right lower lobe peripherally enhancing 4.7 x 7.2 cm mass. There is a moderate right pleural effusion which is marginally larger than on 07/14/2014. There is consolidation and air bronchograms in the posterior basal segment of the left lower lobe, new, and there is a lesser degree of consolidation and volume loss in the lateral basal segment. There is a small pericardial effusion, unchanged. There is mild esophageal dilatation with an air-fluid level and this appears a little worsened. It may be due to compromise to the esophagus by the subcarinal mass.  There is no pneumothorax or pneumomediastinum.  CT ABDOMEN AND PELVIS FINDINGS  There is a large volume free intraperitoneal air. Most of the air is collected anteriorly over the liver and around the stomach. There also is a small volume of subcutaneous air in the left upper quadrant, above and below the percutaneous gastrostomy but not directly around it. On sagittal image 78, series 7 there are 2 air bubbles tracking alongside the percutaneous gastrostomy tube in the abdominal wall musculature.  No focal inflammatory changes are evident in the abdomen or pelvis. No focally inflamed or irregular bowel is evident to identified location of a perforated viscus. There are unremarkable appearances of the colon. The stomach is remarkable only for presence of the percutaneous gastrostomy. There is good contrast distention of the stomach and duodenum with no evidence of contrast extravasation. Small bowel is unremarkable.  There are normal appearances of the liver, gallbladder, bile ducts, pancreas, spleen, adrenals and kidneys.  The abdominal aorta is normal in caliber with moderate atherosclerotic calcification.  No significant skeletal  lesions are evident.  IMPRESSION: 1. Large volume free intraperitoneal air. No focal abnormality is evident to identify a perforated viscus. 2. There is subcutaneous air in the left upper quadrant and in the lower left chest. There also are 2 air bubbles tracking along the percutaneous gastrostomy tube within the anterior abdominal wall. This suggests the possibility of the percutaneous gastrostomy being the etiology of the free air, but it is not conclusive. The gastrostomy device itself appears to be satisfactorily positioned. 3. In the chest, the left mainstem bronchial stent appears to be satisfactorily positioned. There is new consolidation of left lower lobe basal segments. There is worsened esophageal dilatation with air-fluid level, probably due to compromise of the esophagus by the sub- carinal mass. There is slight interval enlargement of a moderate right pleural effusion.   Electronically Signed   By: DaAndreas Newport.D.   On: 08/05/2014 21:11   Ct Abdomen Pelvis W Contrast  08/05/2014   CLINICAL DATA:  Left mainstem bronchus stent placed this morning. Was found to have free intraperitoneal air and was sent to the emergency room.  EXAM: CT CHEST,  ABDOMEN, AND PELVIS WITH CONTRAST  TECHNIQUE: Multidetector CT imaging of the chest, abdomen and pelvis was performed following the standard protocol during bolus administration of intravenous contrast.  CONTRAST:  73m OMNIPAQUE IOHEXOL 300 MG/ML SOLN, 1058mOMNIPAQUE IOHEXOL 300 MG/ML SOLN  COMPARISON:  07/14/2014  FINDINGS: CT CHEST FINDINGS  The left mainstem bronchus stent appears well-positioned. Endo stent lumen is widely patent. There is no significant change in the peripherally enhancing centrally hypodense 5.5 cm sub- carinal mass. There is no significant change in the central right lower lobe peripherally enhancing 4.7 x 7.2 cm mass. There is a moderate right pleural effusion which is marginally larger than on 07/14/2014. There is consolidation  and air bronchograms in the posterior basal segment of the left lower lobe, new, and there is a lesser degree of consolidation and volume loss in the lateral basal segment. There is a small pericardial effusion, unchanged. There is mild esophageal dilatation with an air-fluid level and this appears a little worsened. It may be due to compromise to the esophagus by the subcarinal mass.  There is no pneumothorax or pneumomediastinum.  CT ABDOMEN AND PELVIS FINDINGS  There is a large volume free intraperitoneal air. Most of the air is collected anteriorly over the liver and around the stomach. There also is a small volume of subcutaneous air in the left upper quadrant, above and below the percutaneous gastrostomy but not directly around it. On sagittal image 78, series 7 there are 2 air bubbles tracking alongside the percutaneous gastrostomy tube in the abdominal wall musculature.  No focal inflammatory changes are evident in the abdomen or pelvis. No focally inflamed or irregular bowel is evident to identified location of a perforated viscus. There are unremarkable appearances of the colon. The stomach is remarkable only for presence of the percutaneous gastrostomy. There is good contrast distention of the stomach and duodenum with no evidence of contrast extravasation. Small bowel is unremarkable.  There are normal appearances of the liver, gallbladder, bile ducts, pancreas, spleen, adrenals and kidneys.  The abdominal aorta is normal in caliber with moderate atherosclerotic calcification.  No significant skeletal lesions are evident.  IMPRESSION: 1. Large volume free intraperitoneal air. No focal abnormality is evident to identify a perforated viscus. 2. There is subcutaneous air in the left upper quadrant and in the lower left chest. There also are 2 air bubbles tracking along the percutaneous gastrostomy tube within the anterior abdominal wall. This suggests the possibility of the percutaneous gastrostomy being  the etiology of the free air, but it is not conclusive. The gastrostomy device itself appears to be satisfactorily positioned. 3. In the chest, the left mainstem bronchial stent appears to be satisfactorily positioned. There is new consolidation of left lower lobe basal segments. There is worsened esophageal dilatation with air-fluid level, probably due to compromise of the esophagus by the sub- carinal mass. There is slight interval enlargement of a moderate right pleural effusion.   Electronically Signed   By: DaAndreas Newport.D.   On: 08/05/2014 21:11   Dg Abd Acute W/chest  08/05/2014   CLINICAL DATA:  Air in the abdomen. Patient had a stent placed in the left lung this morning. History of lung cancer.  EXAM: DG ABDOMEN ACUTE W/ 1V CHEST  COMPARISON:  July 24, 2014  FINDINGS: There is a small right pleural effusion enlarged compared to the prior exam. Prominence of right hilum is identified unchanged. The left lung is clear. The heart size is normal. Right central venous line  is unchanged.  There is free air. Focal dilated bowel loop is noted in the lower abdomen/pelvis. Radiopaque needle like densities are projected over the abdomen. A PEG tube is identified.  IMPRESSION: Free air. Further evaluation with CT of abdomen pelvis is recommended.  These results will be called to the ordering clinician or representative by the Radiologist Assistant, and communication documented in the PACS or zVision Dashboard.   Electronically Signed   By: Abelardo Diesel M.D.   On: 08/05/2014 18:16    Labs:  CBC:  Recent Labs  07/25/14 0600 07/27/14 0520 08/05/14 1825 08/06/14 0430  WBC 12.7* 12.3* 12.1* 11.4*  HGB 10.0* 10.6* 10.4* 9.6*  HCT 32.3* 33.9* 32.9* 30.2*  PLT 414* 463* 613* 579*    COAGS:  Recent Labs  11/16/13 0755 01/28/14 1725  INR 1.10 CANCELED  APTT 35 CANCELED    BMP:  Recent Labs  07/25/14 0600 07/27/14 0520 08/05/14 1825 08/06/14 0430  NA 136 134* 133* 134*  K 4.0 3.8  3.6 3.5  CL 103 100* 94* 98*  CO2 '27 25 29 29  '$ GLUCOSE 106* 99 168* 153*  BUN '17 10 10 11  '$ CALCIUM 8.2* 8.8* 9.5 8.8*  CREATININE 0.67 0.62 0.57* 0.56*  GFRNONAA >60 >60 >60 >60  GFRAA >60 >60 >60 >60    LIVER FUNCTION TESTS:  Recent Labs  07/15/14 0206 07/25/14 0600 08/05/14 1825 08/06/14 0430  BILITOT 0.5 0.5 0.4 0.3  AST 17 13* 24 20  ALT 19 19 38 30  ALKPHOS 75 53 97 85  PROT 7.0 6.0* 6.7 6.0*  ALBUMIN 3.0* 2.5* 2.5* 2.2*    Assessment and Plan:  S/p perc G tube 7/19 with subsequent CT showing sig free intraperitoneal air; pt currently stable; plan to do G tube injection on 8/1 to eval for appropriate placement/eval any potential leak  Signed: D. Rowe Robert 08/08/2014, 11:59 AM   I spent a total of 15 minutes in face to face in clinical consultation/evaluation, greater than 50% of which was counseling/coordinating care for perc gastrostomy tube

## 2014-08-09 ENCOUNTER — Telehealth: Payer: Self-pay | Admitting: Internal Medicine

## 2014-08-09 ENCOUNTER — Inpatient Hospital Stay (HOSPITAL_COMMUNITY): Payer: Medicare HMO

## 2014-08-09 DIAGNOSIS — E440 Moderate protein-calorie malnutrition: Secondary | ICD-10-CM

## 2014-08-09 DIAGNOSIS — R1314 Dysphagia, pharyngoesophageal phase: Secondary | ICD-10-CM

## 2014-08-09 DIAGNOSIS — Z515 Encounter for palliative care: Secondary | ICD-10-CM | POA: Insufficient documentation

## 2014-08-09 DIAGNOSIS — K668 Other specified disorders of peritoneum: Secondary | ICD-10-CM

## 2014-08-09 LAB — GLUCOSE, CAPILLARY
GLUCOSE-CAPILLARY: 149 mg/dL — AB (ref 65–99)
Glucose-Capillary: 107 mg/dL — ABNORMAL HIGH (ref 65–99)
Glucose-Capillary: 120 mg/dL — ABNORMAL HIGH (ref 65–99)
Glucose-Capillary: 143 mg/dL — ABNORMAL HIGH (ref 65–99)
Glucose-Capillary: 82 mg/dL (ref 65–99)
Glucose-Capillary: 96 mg/dL (ref 65–99)

## 2014-08-09 MED ORDER — HYDROMORPHONE HCL 1 MG/ML IJ SOLN: 1.0000 mg | Freq: Once | INTRAMUSCULAR | Status: AC

## 2014-08-09 MED ORDER — IOHEXOL 300 MG/ML  SOLN
50.0000 mL | Freq: Once | INTRAMUSCULAR | Status: AC | PRN
  Administered 2014-08-09: 15 mL

## 2014-08-09 MED ORDER — PANTOPRAZOLE SODIUM 40 MG PO TBEC: 40.0000 mg | DELAYED_RELEASE_TABLET | Freq: Every day | ORAL | Status: DC

## 2014-08-09 MED ORDER — FREE WATER: 150.0000 mL | Freq: Four times a day (QID) | Status: DC | PRN

## 2014-08-09 MED ORDER — OXYCODONE HCL 5 MG/5ML PO SOLN
10.0000 mg | ORAL | Status: DC | PRN
  Administered 2014-08-09 – 2014-08-10 (×6): 10 mg via ORAL
  Filled 2014-08-09 (×6): qty 10

## 2014-08-09 MED ORDER — HYDROMORPHONE HCL 1 MG/ML PO LIQD
1.0000 mg | ORAL | Status: DC | PRN
Start: 2014-08-09 — End: 2014-08-10
  Administered 2014-08-10: 1 mg via ORAL
  Filled 2014-08-09: qty 1

## 2014-08-09 MED ORDER — HYDROMORPHONE HCL 2 MG/ML IJ SOLN
INTRAMUSCULAR | Status: AC
  Administered 2014-08-09: 1 mg
  Filled 2014-08-09: qty 1

## 2014-08-09 MED ORDER — OSMOLITE 1.5 CAL PO LIQD
237.0000 mL | Freq: Four times a day (QID) | ORAL | Status: DC
  Administered 2014-08-09 – 2014-08-13 (×16): 237 mL
  Filled 2014-08-09 (×20): qty 237

## 2014-08-09 MED ORDER — PRO-STAT SUGAR FREE PO LIQD
30.0000 mL | Freq: Two times a day (BID) | ORAL | Status: DC
  Administered 2014-08-09 – 2014-08-13 (×9): 30 mL
  Filled 2014-08-09 (×10): qty 30

## 2014-08-09 MED ORDER — PANTOPRAZOLE SODIUM 40 MG PO PACK
40.0000 mg | PACK | Freq: Every day | ORAL | Status: DC
Start: 2014-08-09 — End: 2014-08-13
  Administered 2014-08-09 – 2014-08-13 (×5): 40 mg
  Filled 2014-08-09 (×6): qty 20

## 2014-08-09 MED ORDER — JEVITY 1.2 CAL PO LIQD: 1000.0000 mL | ORAL | Status: DC

## 2014-08-09 NOTE — Telephone Encounter (Signed)
Sent this message to MD/Desk nurse, Pt's wife still would like to see if Dr. Julien Nordmann would call them concerning the scan done at [facility], the MD from [place] advised them that there was a showing of cancer in his left lung which they were not aware of. Pt's wife is concerned about this matter and would like to see if they can get in sooner than the apt that I gave them. I have him set up to see MD on 08/22 labs/ov...Marland KitchenMarland KitchenMarland Kitchen   Pt is requesting a phone concerning this matter this is a f/u from our discussion earlier this morning but after speaking with pt she is still not satisfied concerning the test results and what the information has been given to them.Marland KitchenMarland KitchenMarland Kitchen

## 2014-08-09 NOTE — Progress Notes (Signed)
PT Cancellation Note  Patient Details Name: Steve [name] MRN: [id number] DOB: Jan 14, 1940   Cancelled Treatment:    Reason Eval/Treat Not Completed: Patient declined, no reason specified. Pt declined participation with therapy on today.    Weston Anna, MPT Pager: [phone]

## 2014-08-09 NOTE — Progress Notes (Signed)
[facility] PROGRESS NOTE   Assessment/Plan: Pneumoperitoneum/leukytosis: CT abdomen pelvis on admission showed 2 air bubbles noted tracking along the PEG tube. G-tube was placed due to a 5.5 cm subcarinal mass impinging on the esophagus. - IR was consulted they agreed pneumopericardium most likely due to PEG tube. Plan for fluoroscopic study on 08/09/2014 to evaluate function a G-tube. - Continue Zosyn per surgery recommendation. - Has been getting his IV Dilaudid around-the-clock.  Metastatic non-small cell cancer: Systemic chemotherapy as an outpatient.  A. fib with RVR/ PAF- on Flecainide, : CHADS2 vasc score at least 3 On admission he was started on IV diltiazem and low-dose metoprolol iv, probably need her metoprolol. He spontaneously converted to sinus rhythm 08/07/2014, continue flecainide and daily aspirin.  Dysphagia: Extrinsic compression of the esophagus.  Anemia due to chemotherapy/  Primary cancer of right lower lobe of lung: Hemoglobin seems to be stable.  Severe protein caloric malnutrition: As a consequence of chronic disease, continue ensure.  Protein-calorie malnutrition, severe   Code Status: DNR/DNI Family Communication: plan of care discussed with the patient Disposition Plan: Plan for fluoroscopic study on Monday to evaluate the function / placement of G-tube   Consultants:  IR  surgery  Procedures: Ct Chest W Contrast 08/05/2014 1. Large volume free intraperitoneal air. No focal abnormality is evident to identify a perforated viscus. 2. There is subcutaneous air in the left upper quadrant and in the lower left chest. There also are 2 air bubbles tracking along the percutaneous gastrostomy tube within the anterior abdominal wall. This suggests the possibility of the percutaneous gastrostomy being the etiology of the free air, but it is not conclusive. The gastrostomy device itself appears to be satisfactorily positioned. 3. In the chest,  the left mainstem bronchial stent appears to be satisfactorily positioned. There is new consolidation of left lower lobe basal segments. There is worsened esophageal dilatation with air-fluid level, probably due to compromise of the esophagus by the sub- carinal mass. There is slight interval enlargement of a moderate right pleural effusion. Electronically Signed By: Andreas Newport M.D. On: 08/05/2014 21:11   Ct Abdomen Pelvis W Contrast 08/05/2014 1. Large volume free intraperitoneal air. No focal abnormality is evident to identify a perforated viscus. 2. There is subcutaneous air in the left upper quadrant and in the lower left chest. There also are 2 air bubbles tracking along the percutaneous gastrostomy tube within the anterior abdominal wall. This suggests the possibility of the percutaneous gastrostomy being the etiology of the free air, but it is not conclusive. The gastrostomy device itself appears to be satisfactorily positioned. 3. In the chest, the left mainstem bronchial stent appears to be satisfactorily positioned. There is new consolidation of left lower lobe basal segments. There is worsened esophageal dilatation with air-fluid level, probably due to compromise of the esophagus by the sub- carinal mass. There is slight interval enlargement of a moderate right pleural effusion. Electronically Signed By: Andreas Newport M.D. On: 08/05/2014 21:11   Dg Abd Acute W/chest 08/05/2014 Free air. Further evaluation with CT of abdomen pelvis is recommended.   Antibiotics:  Zosyn since admission.  HPI/Subjective: Patient sleepy.  Objective: Filed Vitals:   08/08/14 1708 08/08/14 2031 08/09/14 0416 08/09/14 0835  BP:  127/87 153/84   Pulse:  106 117   Temp:  97.8 F (36.6 C) 98.5 F (36.9 C)   TempSrc:  Oral Oral   Resp:  20 20   Height:      Weight:  SpO2: 94% 93% 99% 96%    Intake/Output Summary (Last 24 hours) at 08/09/14 1049 Last data filed at 08/09/14  0856  Gross per 24 hour  Intake   1825 ml  Output   1600 ml  Net    225 ml   Filed Weights   08/07/14 0400  Weight: 74.8 kg (164 lb 14.5 oz)    Exam:  General: Alert, awake, oriented x3, in no acute distress.  HEENT: No bruits, no goiter.  Heart: Regular rate and rhythm. Lungs: Good air movement, clear Abdomen: Soft, nontender, nondistended, positive bowel sounds.  Neuro: Grossly intact, nonfocal.   Data Reviewed: Basic Metabolic Panel:  Recent Labs Lab 08/05/14 1825 08/06/14 0430  NA 133* 134*  K 3.6 3.5  CL 94* 98*  CO2 29 29  GLUCOSE 168* 153*  BUN 10 11  CREATININE 0.57* 0.56*  CALCIUM 9.5 8.8*   Liver Function Tests:  Recent Labs Lab 08/05/14 1825 08/06/14 0430  AST 24 20  ALT 38 30  ALKPHOS 97 85  BILITOT 0.4 0.3  PROT 6.7 6.0*  ALBUMIN 2.5* 2.2*   No results for input(s): LIPASE, AMYLASE in the last 168 hours. No results for input(s): AMMONIA in the last 168 hours. CBC:  Recent Labs Lab 08/05/14 1825 08/06/14 0430  WBC 12.1* 11.4*  NEUTROABS 11.0*  --   HGB 10.4* 9.6*  HCT 32.9* 30.2*  MCV 90.4 90.7  PLT 613* 579*   Cardiac Enzymes: No results for input(s): CKTOTAL, CKMB, CKMBINDEX, TROPONINI in the last 168 hours. BNP (last 3 results)  Recent Labs  07/14/14 1916  BNP 333.5*    ProBNP (last 3 results) No results for input(s): PROBNP in the last 8760 hours.  CBG:  Recent Labs Lab 08/08/14 1648 08/08/14 2029 08/08/14 2351 08/09/14 0414 08/09/14 0738  GLUCAP 83 85 89 107* 120*    Recent Results (from the past 240 hour(s))  MRSA PCR Screening     Status: None   Collection Time: 08/06/14  2:34 AM  Result Value Ref Range Status   MRSA by PCR NEGATIVE NEGATIVE Final    Comment:        The GeneXpert MRSA Assay (FDA approved for NASAL specimens only), is one component of a comprehensive MRSA colonization surveillance program. It is not intended to diagnose MRSA infection nor to guide or monitor treatment for MRSA  infections.      Studies: No results found.  Scheduled Meds: . albuterol  3 mL Inhalation BID  . antiseptic oral rinse  7 mL Mouth Rinse q12n4p  . chlorhexidine  15 mL Mouth Rinse BID  . flecainide  100 mg Per Tube BID  . insulin aspart  0-15 Units Subcutaneous TID WC  . metoprolol  2.5 mg Intravenous 4 times per day  . piperacillin-tazobactam (ZOSYN)  IV  3.375 g Intravenous 3 times per day   Continuous Infusions: . dextrose 5 % and 0.9% NaCl 75 mL/hr at 08/09/14 0823    Time Spent: 25 min   Charlynne Cousins  [facility] Pager [phone]. If 7PM-7AM, please contact night-coverage at [email or web address], password [facility] 08/09/2014, 10:49 AM  LOS: 4 days

## 2014-08-09 NOTE — Care Management Important Message (Signed)
Important Message  Patient Details  Name: Steve [name] MRN: [id number] Date of Birth: 1941-01-02   Medicare Important Message Given:  Yes-third notification given    Shelda Altes 08/09/2014, 3:00 [place] Message  Patient Details  Name: Steve [name] MRN: [id number] Date of Birth: 10/08/1940   Medicare Important Message Given:  Yes-third notification given    Shelda Altes 08/09/2014, 3:00 PM

## 2014-08-09 NOTE — Progress Notes (Signed)
ANTIBIOTIC CONSULT NOTE - FOLLOW UP  Pharmacy Consult for zosyn Indication: Intra-abdominal infection  Allergies  Allergen Reactions  . Morphine Hives    Tolerated dilaudid on multiple occasions.  Takes OxyContin at home  . Morphine And Related Hives    Tolerated dilaudid on multiple occasions.  Takes OxyContin at home    Patient Measurements: Height: '5\' 11"'$  (180.3 cm) Weight: 164 lb 14.5 oz (74.8 kg) IBW/kg (Calculated) : 75.3  Vital Signs: Temp: 98.5 F (36.9 C) (08/01 0416) Temp Source: Oral (08/01 0416) BP: 153/84 mmHg (08/01 0416) Pulse Rate: 117 (08/01 0416) Intake/Output from previous day: 07/31 0701 - 08/01 0700 In: 1825 [I.V.:1725; IV Piggyback:100] Out: 2355 [Urine:1050; Drains:600] Intake/Output from this shift: Total I/O In: 0  Out: 150 [Urine:150]  Labs: No results for input(s): WBC, HGB, PLT, LABCREA, CREATININE in the last 72 hours. Estimated Creatinine Clearance: 87 mL/min (by C-G formula based on Cr of 0.56). No results for input(s): VANCOTROUGH, VANCOPEAK, VANCORANDOM, GENTTROUGH, GENTPEAK, GENTRANDOM, TOBRATROUGH, TOBRAPEAK, TOBRARND, AMIKACINPEAK, AMIKACINTROU, AMIKACIN in the last 72 hours.   Microbiology: Recent Results (from the past 720 hour(s))  Blood culture (routine x 2)     Status: None   Collection Time: 07/14/14  7:39 PM  Result Value Ref Range Status   Specimen Description BLOOD PORTA CATH  Final   Special Requests BOTTLES DRAWN AEROBIC AND ANAEROBIC 5CC  Final   Culture   Final    NO GROWTH 5 DAYS Performed at [facility]    Report Status 07/20/2014 FINAL  Final  Blood culture (routine x 2)     Status: None   Collection Time: 07/14/14  8:39 PM  Result Value Ref Range Status   Specimen Description BLOOD RIGHT HAND  Final   Special Requests BOTTLES DRAWN AEROBIC ONLY 3ML  Final   Culture   Final    NO GROWTH 5 DAYS Performed at [facility]    Report Status 07/20/2014 FINAL  Final  MRSA PCR Screening     Status:  None   Collection Time: 07/14/14 11:53 PM  Result Value Ref Range Status   MRSA by PCR NEGATIVE NEGATIVE Final    Comment:        The GeneXpert MRSA Assay (FDA approved for NASAL specimens only), is one component of a comprehensive MRSA colonization surveillance program. It is not intended to diagnose MRSA infection nor to guide or monitor treatment for MRSA infections.   Blood culture (routine x 2)     Status: None   Collection Time: 07/24/14  3:35 PM  Result Value Ref Range Status   Specimen Description BLOOD RIGHT ANTECUBITAL  Final   Special Requests BOTTLES DRAWN AEROBIC AND ANAEROBIC 5CC  Final   Culture   Final    NO GROWTH 5 DAYS Performed at [facility]    Report Status 07/29/2014 FINAL  Final  Blood culture (routine x 2)     Status: None   Collection Time: 07/24/14  3:38 PM  Result Value Ref Range Status   Specimen Description BLOOD LEFT HAND  Final   Special Requests BOTTLES DRAWN AEROBIC AND ANAEROBIC [facility] EACH  Final   Culture   Final    NO GROWTH 5 DAYS Performed at [facility]    Report Status 07/29/2014 FINAL  Final  MRSA PCR Screening     Status: None   Collection Time: 08/06/14  2:34 AM  Result Value Ref Range Status   MRSA by PCR NEGATIVE NEGATIVE Final  Comment:        The GeneXpert MRSA Assay (FDA approved for NASAL specimens only), is one component of a comprehensive MRSA colonization surveillance program. It is not intended to diagnose MRSA infection nor to guide or monitor treatment for MRSA infections.     Anti-infectives    Start     Dose/Rate Route Frequency Ordered Stop   08/08/14 1400  piperacillin-tazobactam (ZOSYN) IVPB 3.375 g     3.375 g 12.5 mL/hr over 240 Minutes Intravenous 3 times per day 08/08/14 1135     08/06/14 1800  piperacillin-tazobactam (ZOSYN) IVPB 3.375 g  Status:  Discontinued     3.375 g 100 mL/hr over 30 Minutes Intravenous 4 times per day 08/06/14 1621 08/08/14 1135   08/06/14 0200   piperacillin-tazobactam (ZOSYN) IVPB 3.375 g  Status:  Discontinued     3.375 g 12.5 mL/hr over 240 Minutes Intravenous Every 8 hours 08/06/14 0022 08/06/14 1621   08/05/14 1830  piperacillin-tazobactam (ZOSYN) IVPB 3.375 g     3.375 g 100 mL/hr over 30 Minutes Intravenous  Once 08/05/14 1818 08/05/14 1926      Assessment: Patient's a 74 y.o M with pneumoperitoneum currently on zosyn day #5 for broad empiric coverage. RN called pharmacy on 7/29 to inform that patient only has one route for IV access and currently needs continuous infusion diltiazem. Zosyn and diltiazem are incompatible. Pharmacy was requested to reduce infusion time of Zosyn to decrease interruption time of diltiazem infusion.  Zosyn regimen was changed to 3.375 gm IV q6h (infuse over 30 minutes) on 08/06/14.  Diltiazem drip d/ced on 7/30 with zosyn changed back to 3.375 gm q8h (infuse over 4 hours).  7/28 >>zosyn  >>  NO cultures  Plan:  - continue zosyn 3.375 gm IV q8h (infuse over 4 hours) - please indicate plan/LOT for abx - with stable renal function, pharmacy will follow patient peripherally and will leave note if dose adjustment is needed  Janus Vlcek P 08/09/2014,10:15 AM

## 2014-08-09 NOTE — Progress Notes (Signed)
Patient ID: Steve [name], male   DOB: 1940-08-08, 74 y.o.   MRN: [id number] [facility] Surgery Progress Note:   * No surgery found *  Subjective: Mental status is clear;   Objective: Vital signs in last 24 hours: Temp:  [97.7 F (36.5 C)-98.5 F (36.9 C)] 98.3 F (36.8 C) (08/01 1352) Pulse Rate:  [105-117] 110 (08/01 1352) Resp:  [19-20] 19 (08/01 1352) BP: (110-153)/(56-87) 110/56 mmHg (08/01 1352) SpO2:  [93 %-99 %] 97 % (08/01 1352)  Intake/Output from previous day: 07/31 0701 - 08/01 0700 In: 1825 [I.V.:1725; IV Piggyback:100] Out: 1650 [Urine:1050; Drains:600] Intake/Output this shift: Total I/O In: 0  Out: 450 [Urine:150; Drains:300]  Physical Exam: Work of breathing is not labored;  No pain around G tube site  Lab Results:  Results for orders placed or performed during the hospital encounter of 08/05/14 (from the past 48 hour(s))  Glucose, capillary     Status: Abnormal   Collection Time: 08/07/14  4:53 PM  Result Value Ref Range   Glucose-Capillary 101 (H) 65 - 99 mg/dL  Glucose, capillary     Status: Abnormal   Collection Time: 08/07/14  8:28 PM  Result Value Ref Range   Glucose-Capillary 106 (H) 65 - 99 mg/dL  Glucose, capillary     Status: None   Collection Time: 08/07/14 11:52 PM  Result Value Ref Range   Glucose-Capillary 90 65 - 99 mg/dL  Glucose, capillary     Status: None   Collection Time: 08/08/14  4:26 AM  Result Value Ref Range   Glucose-Capillary 93 65 - 99 mg/dL  Glucose, capillary     Status: None   Collection Time: 08/08/14  8:10 AM  Result Value Ref Range   Glucose-Capillary 87 65 - 99 mg/dL  Glucose, capillary     Status: None   Collection Time: 08/08/14 12:02 PM  Result Value Ref Range   Glucose-Capillary 97 65 - 99 mg/dL  Glucose, capillary     Status: None   Collection Time: 08/08/14  4:48 PM  Result Value Ref Range   Glucose-Capillary 83 65 - 99 mg/dL  Glucose, capillary     Status: None   Collection Time: 08/08/14  8:29 PM   Result Value Ref Range   Glucose-Capillary 85 65 - 99 mg/dL  Glucose, capillary     Status: None   Collection Time: 08/08/14 11:51 PM  Result Value Ref Range   Glucose-Capillary 89 65 - 99 mg/dL  Glucose, capillary     Status: Abnormal   Collection Time: 08/09/14  4:14 AM  Result Value Ref Range   Glucose-Capillary 107 (H) 65 - 99 mg/dL  Glucose, capillary     Status: Abnormal   Collection Time: 08/09/14  7:38 AM  Result Value Ref Range   Glucose-Capillary 120 (H) 65 - 99 mg/dL  Glucose, capillary     Status: Abnormal   Collection Time: 08/09/14 12:09 PM  Result Value Ref Range   Glucose-Capillary 143 (H) 65 - 99 mg/dL    Radiology/Results: No results found.  Anti-infectives: Anti-infectives    Start     Dose/Rate Route Frequency Ordered Stop   08/08/14 1400  piperacillin-tazobactam (ZOSYN) IVPB 3.375 g     3.375 g 12.5 mL/hr over 240 Minutes Intravenous 3 times per day 08/08/14 1135     08/06/14 1800  piperacillin-tazobactam (ZOSYN) IVPB 3.375 g  Status:  Discontinued     3.375 g 100 mL/hr over 30 Minutes Intravenous 4 times per day 08/06/14  1621 08/08/14 1135   08/06/14 0200  piperacillin-tazobactam (ZOSYN) IVPB 3.375 g  Status:  Discontinued     3.375 g 12.5 mL/hr over 240 Minutes Intravenous Every 8 hours 08/06/14 0022 08/06/14 1621   08/05/14 1830  piperacillin-tazobactam (ZOSYN) IVPB 3.375 g     3.375 g 100 mL/hr over 30 Minutes Intravenous  Once 08/05/14 1818 08/05/14 1926      Assessment/Plan: Problem List: Patient Active Problem List   Diagnosis Date Noted  . Pneumoperitoneum 08/06/2014  . Protein-calorie malnutrition, severe 08/06/2014  . Gastrostomy in place 08/05/2014  . Bronchial stenosis s/p stenting 08/05/2014 08/05/2014  . Pneumoperitoneum of unknown etiology 08/05/2014  . Esophageal stricture 07/26/2014  . Odynophagia 07/25/2014  . Atrial fibrillation with rapid ventricular response 07/24/2014  . HCAP (healthcare-associated pneumonia) 07/15/2014   . Malnutrition of moderate degree 07/15/2014  . Erosive esophagitis on recent endoscopy 07/15/2014  . Dyspnea 07/14/2014  . Dysphagia, pharyngoesophageal phase 06/14/2014  . Malignant neoplasm of lower lobe of right lung 01/28/2014  . Pleural effusion, right 01/28/2014  . ILD (interstitial lung disease) 01/28/2014  . Wheezing 07/15/2013  . Insomnia 07/15/2013  . Neuropathic pain 04/29/2013  . PAF- on Flecainide,  04/29/2013  . Chronic pain disorder 04/29/2013  . DM type 2 with diabetic peripheral neuropathy 04/29/2013  . Primary cancer of right lower lobe of lung 12/25/2012  . Subclinical hypothyroidism 11/19/2012  . Need for prophylactic vaccination and inoculation against influenza 09/20/2012  . Reactive airway disease 08/19/2012  . Benign paroxysmal positional vertigo 07/29/2012  . Impotence of organic origin   . Osteoarthritis, shoulder     OK for discharge from surgery standpoint.   * No surgery found *    LOS: 4 days   Matt B. Hassell Done, MD, [facility] [phone] beeper [phone]  08/09/2014 1:58 PM

## 2014-08-09 NOTE — Progress Notes (Signed)
RN and Central Monitoring both observed patient going in and out of Afib/ST rhythm. When patient is coughing, up to bathroom, or moving around HR increases and patient converts to Afib, but when he returns to rested state or is sleeping, he returns to [place] with a rate in the low 100s-110s. Will continue to monitor.

## 2014-08-09 NOTE — Consult Note (Signed)
Consultation Note Date: 08/09/2014   Patient Name: Steve [name]  DOB: Jul 10, 1940  MRN: [id number]  Age / Sex: 74 y.o., male   PCP: [name], MD Referring Physician: Charlynne Cousins, MD  Reason for Consultation: establishing goals of care.   Palliative Care Assessment and Plan Summary of Established Goals of Care and Medical Treatment Preferences  Steve [name] is a 74 y.o. 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   Patient received a percutaneous g-tube electively on 07/27/2014 with VIR for nutrition purposes, without complication.   CT completed on the patients admission on 08/05/2014 shows pneumoperitoneum, with no evidence of visceral injury. Patient was noted to not have any new significant abdominal pain. Patient has been placed on Zosyn, he is on IV Dilaudid. This hospitalization course was also with A fib with RVR requiring IV Diltiazem and IV Metoprolol, patient spontaneously converted to NSR on 08-07-14.   A palliative consultation has been requested for further discussions about the patient's current illness. The patient is resting in chair, his wife is at the bedside. Patient states, "vow, I didn't know you were coming." Introduced scope of palliative medicine, defined role of palliative care is to follow patients with cancer concurrently, address advanced care planning, symptom management and to help facilitate decision making.   The patient complains of pain in his chest and his back. He states he has "back spasms". He states the IV Dilaudid helps take the edge off, he has been needing IV Dilaudid around the clock. The patient's wife is at the bedside. The patient's wife is most concerned about  [facility] findings of new L lung mass. She does not want to wait till 08-30-14 for patient's next appointment.   Patient and his wife's goals at the moment are for adequate pain management as well as prompt oncology follow up. They are not receptive to goals of care and end of life conversations currently.   Discussed patient's pain regimen. Patient was on liquid PO Oxycodone at home '5mg'$  PO Q 4 hours PRN. He was using that sparingly, although he admits he might have needed it more. Currently has had around 10 mg IV Dilaudid in the past 24-36 hours. Patient had difficulty getting OxyContin prescription filled from his pharmacy after his last D/C.   Recommend pain regimen be revised to Oxycodone IR 10-15 mg PO Q 3 hours PRN. Would also recommend OxyContin 15 mg po BID scheduled.    Recommend patient's oncologist follow up with the patient and family to address concerns. Discussed with Dr Olevia Bowens.   Thank you for allowing palliative care to participate in the care of your patient.   Contacts/Participants in Discussion: Primary Decision Maker: patient then his wife Haynes Dage HCPOA: no     Code Status/Advance Care Planning:  DNR  Symptom Management:   Oxycodone PO on D/C, also consider adding OxyContin, would advise the cancer center to work on authorizing the patient's pain regimen. Continue IV Dilaudid while the patient is hospitalized.   Palliative Prophylaxis: yes  Additional Recommendations (Limitations, Scope, Preferences):  Help alleviate the patient and wife's concerns about imaging at [facility] showing new L lung mass.  Psycho-social/Spiritual:   Support System: strong, wife   Desire for further Chaplaincy  support:no  Prognosis: Unable to determine  Discharge Planning:   likely home soon   Values: to continue treatment, to continue life maintaining therapy at this point.  Life limiting illness: metastatic NSCLC RLL lung.       Chief Complaint/History of Present Illness: incidental  finding of pneumoperitoneum.   Primary Diagnoses  Present on Admission:  . Primary cancer of right lower lobe of lung . PAF- on Flecainide,  . Erosive esophagitis on recent endoscopy . Atrial fibrillation with rapid ventricular response . Malnutrition of moderate degree . Pleural effusion, right . Dysphagia, pharyngoesophageal phase . Dyspnea . Wheezing . Pneumoperitoneum  Palliative Review of Systems: completed I have reviewed the medical record, interviewed the patient and family, and examined the patient. The following aspects are pertinent.  Past Medical History  Diagnosis Date  . Dizziness and giddiness     positional vertigo  . Hyperlipidemia   . Impotence of organic origin   . Osteoarthritis, shoulder   . Allergy   . Hypothyroidism     pt denies thryoid disease, dr pandey note 11-19-12 says subclinical hypothryoid in epic  . COPD (chronic obstructive pulmonary disease)     pt denies  . Insomnia   . Hx of radiation therapy 02/2013    right lung  . Diabetes mellitus without complication     pt denies diabetes, dr pandey note says dm 11-19-12 epic  . 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 Malignant neoplasm of bladder, part unspecified   . Malignant neoplasm of bronchus and lung, unspecified site   . PAF (paroxysmal atrial fibrillation)     a. not on anticoag due to oncologic issues, on flecainide.  . Paroxysmal SVT (supraventricular tachycardia)   . Chronic respiratory failure   . Esophageal stricture   . Esophagitis   . Reactive airway disease   . Acute URI 07/29/2012  . Syncope 12/21/2013    Jan 2016 with documented orthostatic B/P and rapid PSVT on holter   . Ethmoid sinusitis 03/10/2014  . Orthostatic hypotension-on Midodrine 07/15/2014   History   Social History  . Marital Status: Married    Spouse Name: N/A  . Number of Children: 3  . Years of Education: N/A   Occupational History  . business owner    Social History Main Topics  .  Smoking status: Former Smoker -- 1.50 packs/day for 50 years    Types: Cigarettes    Quit date: 01/09/2007  . Smokeless tobacco: Former Systems developer    Types: Chew    Quit date: 05/28/2014  . Alcohol Use: No  . Drug Use: No  . Sexual Activity: Not on file   Other Topics Concern  . None   Social History Narrative   Family History  Problem Relation Age of Onset  . Kidney disease Brother   . Hypertension Mother   . Diabetes Mother   . Cancer Father     type unknown  . Stroke Father    Scheduled Meds: . albuterol  3 mL Inhalation BID  . antiseptic oral rinse  7 mL Mouth Rinse q12n4p  . chlorhexidine  15 mL Mouth Rinse BID  . feeding supplement (OSMOLITE 1.5 CAL)  237 mL Per Tube QID  . feeding supplement (PRO-STAT SUGAR FREE 64)  30 mL Per Tube BID  . flecainide  100 mg Per Tube BID  . insulin aspart  0-15 Units Subcutaneous TID WC  . metoprolol  2.5 mg Intravenous 4 times per day  .  piperacillin-tazobactam (ZOSYN)  IV  3.375 g Intravenous 3 times per day   Continuous Infusions: . dextrose 5 % and 0.9% NaCl 75 mL/hr at 08/09/14 0823   PRN Meds:.acetaminophen **OR** acetaminophen, free water, HYDROmorphone HCl, lidocaine, ondansetron **OR** ondansetron (ZOFRAN) IV, oxyCODONE, sodium chloride, temazepam Medications Prior to Admission:  Prior to Admission medications   Medication Sig Start Date End Date Taking? Authorizing Provider  albuterol (PROVENTIL) (5 MG/ML) 0.5% nebulizer solution Inhale 0.5 mLs into the lungs 2 (two) times daily. 08/05/14 11/03/14 Yes Historical Provider, MD  aspirin 81 MG tablet Place 1 tablet (81 mg total) into feeding tube daily. 07/29/14  Yes Velvet Bathe, MD  diltiazem (CARDIZEM) 90 MG tablet Take 1 tablet (90 mg total) by mouth every 6 (six) hours. 07/30/14  Yes Fay Records, MD  flecainide (TAMBOCOR) 100 MG tablet Place 1 tablet (100 mg total) into feeding tube 2 (two) times daily. 07/30/14  Yes Velvet Bathe, MD  guaiFENesin (MUCINEX) 600 MG 12 hr tablet  Take 600 mg by mouth every 12 (twelve) hours as needed for cough.   Yes Historical Provider, MD  lactose free nutrition (BOOST PLUS) LIQD Take 237 mLs by mouth 2 (two) times daily between meals. 07/29/14  Yes Velvet Bathe, MD  lidocaine (XYLOCAINE) 2 % solution Use as directed 20 mLs in the mouth or throat every 6 (six) hours as needed for mouth pain. 06/22/14  Yes Maryanna Shape, NP  Nutritional Supplements (FEEDING SUPPLEMENT, OSMOLITE 1.5 CAL,) LIQD Place 237 mLs into feeding tube 4 (four) times daily. 07/29/14  Yes Velvet Bathe, MD  omeprazole (PRILOSEC) 40 MG capsule Take 40 mg by mouth 2 (two) times daily.  07/22/14  Yes Historical Provider, MD  oxyCODONE (ROXICODONE) 5 MG/5ML solution Take 5 mLs (5 mg total) by mouth every 4 (four) hours as needed for moderate pain. 07/29/14  Yes Velvet Bathe, MD  polyethylene glycol (MIRALAX / GLYCOLAX) packet Take 17 g by mouth daily as needed for mild constipation or moderate constipation.    Yes Historical Provider, MD  simvastatin (ZOCOR) 20 MG tablet Place 1 tablet (20 mg total) into feeding tube daily. Take one tablet by mouth once daily to lower cholesterol 07/29/14  Yes Velvet Bathe, MD  sodium chloride HYPERTONIC 3 % nebulizer solution Inhale 3 mLs into the lungs 2 (two) times daily. 08/05/14 11/03/14 Yes Historical Provider, MD  temazepam (RESTORIL) 30 MG capsule Place 1 capsule (30 mg total) into feeding tube at bedtime as needed for sleep. 07/29/14  Yes Velvet Bathe, MD  Water For Irrigation, Sterile (FREE WATER) SOLN Place 120 mLs into feeding tube 4 (four) times daily. 07/29/14  Yes Velvet Bathe, MD  Amino Acids-Protein Hydrolys (FEEDING SUPPLEMENT, PRO-STAT SUGAR FREE 64,) LIQD Place 30 mLs into feeding tube 2 (two) times daily. Patient not taking: Reported on 08/05/2014 07/29/14   Velvet Bathe, MD  pantoprazole sodium (PROTONIX) 40 mg/20 mL PACK Place 40 mLs (80 mg total) into feeding tube daily. Patient not taking: Reported on 08/05/2014 07/17/14   Hosie Poisson, MD   Allergies  Allergen Reactions  . Morphine Hives    Tolerated dilaudid on multiple occasions.  Takes OxyContin at home  . Morphine And Related Hives    Tolerated dilaudid on multiple occasions.  Takes OxyContin at home   CBC:    Component Value Date/Time   WBC 11.4* 08/06/2014 0430   WBC 15.9* 07/06/2014 1325   WBC 12.7* 05/10/2014 1707   HGB 9.6* 08/06/2014 0430   HGB  12.5* 07/06/2014 1325   HCT 30.2* 08/06/2014 0430   HCT 38.6 07/06/2014 1325   HCT 36.5* 05/10/2014 1707   PLT 579* 08/06/2014 0430   PLT 552* 07/06/2014 1325   MCV 90.7 08/06/2014 0430   MCV 93.7 07/06/2014 1325   NEUTROABS 11.0* 08/05/2014 1825   NEUTROABS 13.6* 07/06/2014 1325   NEUTROABS 9.9* 05/10/2014 1707   LYMPHSABS 0.9 08/05/2014 1825   LYMPHSABS 0.8* 07/06/2014 1325   LYMPHSABS 1.0 05/10/2014 1707   MONOABS 0.2 08/05/2014 1825   MONOABS 1.3* 07/06/2014 1325   EOSABS 0.0 08/05/2014 1825   EOSABS 0.1 07/06/2014 1325   BASOSABS 0.0 08/05/2014 1825   BASOSABS 0.1 07/06/2014 1325   BASOSABS 0.0 05/10/2014 1707   Comprehensive Metabolic Panel:    Component Value Date/Time   NA 134* 08/06/2014 0430   NA 137 07/06/2014 1325   NA 142 04/28/2013 0819   K 3.5 08/06/2014 0430   K 4.3 07/06/2014 1325   CL 98* 08/06/2014 0430   CO2 29 08/06/2014 0430   CO2 25 07/06/2014 1325   BUN 11 08/06/2014 0430   BUN 18.4 07/06/2014 1325   BUN 12 04/28/2013 0819   CREATININE 0.56* 08/06/2014 0430   CREATININE 0.8 07/06/2014 1325   GLUCOSE 153* 08/06/2014 0430   GLUCOSE 185* 07/06/2014 1325   GLUCOSE 103* 04/28/2013 0819   CALCIUM 8.8* 08/06/2014 0430   CALCIUM 9.2 07/06/2014 1325   AST 20 08/06/2014 0430   AST 15 07/06/2014 1325   ALT 30 08/06/2014 0430   ALT 13 07/06/2014 1325   ALKPHOS 85 08/06/2014 0430   ALKPHOS 86 07/06/2014 1325   BILITOT 0.3 08/06/2014 0430   BILITOT 0.50 07/06/2014 1325   PROT 6.0* 08/06/2014 0430   PROT 7.2 07/06/2014 1325   PROT 6.8 04/28/2013 0819   ALBUMIN  2.2* 08/06/2014 0430   ALBUMIN 2.9* 07/06/2014 1325    Physical Exam: Vital Signs: BP 153/84 mmHg  Pulse 117  Temp(Src) 98.5 F (36.9 C) (Oral)  Resp 20  Ht '5\' 11"'$  (1.803 m)  Wt 74.8 kg (164 lb 14.5 oz)  BMI 23.01 kg/m2  SpO2 96% SpO2: SpO2: 96 % O2 Device: O2 Device: Not Delivered O2 Flow Rate: O2 Flow Rate (L/min): 1 L/min Intake/output summary:  Intake/Output Summary (Last 24 hours) at 08/09/14 1339 Last data filed at 08/09/14 0856  Gross per 24 hour  Intake   1775 ml  Output   1600 ml  Net    175 ml   LBM: Last BM Date: 08/04/14 (per patient. ) Baseline Weight: Weight: 74.8 kg (164 lb 14.5 oz) Most recent weight: Weight: 74.8 kg (164 lb 14.5 oz)  Exam Findings:   NAD Clear has port a cath S1S2  abdomen soft non tender Awake alert non focal                Palliative Performance Scale: 40%  Additional Data Reviewed: No results for input(s): WBC, HGB, PLT, NA, BUN, CREATININE, ALB in the last 72 hours.   Time In: 1300 Time Out: 1355 Time Total: 55 min  Greater than 50%  of this time was spent counseling and coordinating care related to the above assessment and plan.  Signed by: Loistine Chance, MD  [place], MD  08/09/2014, 1:39 PM  Please contact Palliative Medicine Team phone at [phone] for questions and concerns.

## 2014-08-09 NOTE — Progress Notes (Signed)
Nutrition Follow-up  DOCUMENTATION CODES:   Severe malnutrition in context of chronic illness  INTERVENTION:  - Will order 1 can Osmolite 1.5 QID with Prostat BID to provide 1622 kcal (74% needs), 90 grams protein (82% needs), and 724 mL free water. Will order 150 mL free water with each bolus (75 mL before and after each bolus) - RD will continue to monitor for needs and advance TF as able  NUTRITION DIAGNOSIS:   Inadequate oral intake related to inability to eat as evidenced by NPO status. -ongoing, TF not yet started  GOAL:   Patient will meet greater than or equal to 90% of their needs -unmet  MONITOR:   Diet advancement, TF tolerance, Weight trends, Labs, I & O's  REASON FOR ASSESSMENT:   Consult Enteral/tube feeding initiation and management  ASSESSMENT:  74 y.o. male With PMH significant for A fib, COPD, Diabetes, Non small lung cell cancer, Patient had bronchial stent placement at [facility] today 7-28, per report pulmonary fellow relates stent placement went well. Patient was on his way home from [facility] when he received a call, that he had air in the abdomen and he was refer to the near ED.  Patient also has been having left upper quadrant pain for months. He had endoscopy that showed esophageal ulcer and erosive gastritis. He has had G tube placed on July 19-2016. He denies worsening abdominal pain. Denies worsening dyspnea.  In the ED he was found to be in A fib RVR. He didn't take his medications today.  Dr Johney Maine with sx evaluated patient. He recommend Ct abdomen, pelvis and chest which showed: Large volume free intraperitoneal air.   8/1 New consult for RD to initiate and manage TF. Pt has remained NPO since admission. Per notes, it was believed pneumopericardium was likely due to PEG. Per surgery. Radiology note this AM indicates PEG injection indicated tube is in the stomach and negative for leak. Surgery note at 1135 this AM states okay to start TF.   Will order TF  regimen as outlined above. Goal: 6 cans Osmolite 1.5/day with Prostat BID which will provide 2333 kcal, 119 grams protein, and 1086 mL free water.  Not meeting needs currently. Medications reviewed. Labs reviewed; CBGs: 83-143 mg/dL.   7/29 - Pt with PEG placed 07/27/14 but currently unable to use pending surgery recommendations related to free intraperitoneal air - Since being d/c from the hospital, pt had gotten up to 4 cans Osmolite 1.5/day without issue.  - He was doing small amounts of liquids PO such as water or ice cream that he had allowed to melt in his mouth. Boost and applesauce tasted "awful" to him and he refused to consume these.   Diet Order:  Diet NPO time specified  Skin:  Reviewed, no issues  Last BM:  7/27  Height:   Ht Readings from Last 1 Encounters:  08/07/14 '5\' 11"'$  (1.803 m)    Weight:   Wt Readings from Last 1 Encounters:  08/07/14 164 lb 14.5 oz (74.8 kg)    Ideal Body Weight:  78.18 kg  BMI:  Body mass index is 23.01 kg/(m^2).  Estimated Nutritional Needs:   Kcal:  2200-2400  Protein:  110-120 grams  Fluid:  2.2 L/day  EDUCATION NEEDS:   No education needs identified at this time     Jarome Matin, RD, LDN Inpatient Clinical Dietitian Pager # [phone] After hours/weekend pager # [phone]

## 2014-08-09 NOTE — Progress Notes (Signed)
  Subjective: He has no new discomforts, he is on pain medicine for abd discomfort that started with chemo, and some back pain.  He has no abdominal pain and has not had any since he was sent here by [facility].  Films today shows G tube is in good position with no leak. It has been draining his stomach well also .   Objective: Vital signs in last 24 hours: Temp:  [97.7 F (36.5 C)-98.5 F (36.9 C)] 98.5 F (36.9 C) (08/01 0416) Pulse Rate:  [105-117] 117 (08/01 0416) Resp:  [19-20] 20 (08/01 0416) BP: (121-153)/(77-87) 153/84 mmHg (08/01 0416) SpO2:  [93 %-99 %] 96 % (08/01 0835) Last BM Date: 08/04/14 (per patient. ) NPO 600 from gastrostomy tube No Bm Afebrile, VSS, he remains tachycardic, but sats good on RA. No labs since 7/28, G tube injected today: confirms tube in the stomach. Neg for leak Stomach decompressed No complaints Intake/Output from previous day: 07/31 0701 - 08/01 0700 In: 1825 [I.V.:1725; IV Piggyback:100] Out: 1937 [Urine:1050; Drains:600] Intake/Output this shift: Total I/O In: 0  Out: 150 [Urine:150]  General appearance: alert, cooperative and no distress GI: soft, non-tender; bowel sounds normal; no masses,  no organomegaly and G tube looks fine, he has no pain or distension on exam  Lab Results:  No results for input(s): WBC, HGB, HCT, PLT in the last 72 hours.  BMET No results for input(s): NA, K, CL, CO2, GLUCOSE, BUN, CREATININE, CALCIUM in the last 72 hours. PT/INR No results for input(s): LABPROT, INR in the last 72 hours.   Recent Labs Lab 08/05/14 1825 08/06/14 0430  AST 24 20  ALT 38 30  ALKPHOS 97 85  BILITOT 0.4 0.3  PROT 6.7 6.0*  ALBUMIN 2.5* 2.2*     Lipase     Component Value Date/Time   LIPASE 59.0 01/28/2014 1725     Studies/Results: No results found.  Medications: . albuterol  3 mL Inhalation BID  . antiseptic oral rinse  7 mL Mouth Rinse q12n4p  . chlorhexidine  15 mL Mouth Rinse BID  . flecainide  100 mg Per  Tube BID  . insulin aspart  0-15 Units Subcutaneous TID WC  . metoprolol  2.5 mg Intravenous 4 times per day  . piperacillin-tazobactam (ZOSYN)  IV  3.375 g Intravenous 3 times per day    Assessment/Plan 1. Pneumoperitoneum 1A. G tube placed by IR - J. Wagner on 07/27/2014 for This was placed because the 5.5 cm subcarinal mass appears to be impinging on the esophagus and obstructing it. 2.  Metastatic non small cell lung cancer - sees Dr. Mayme Genta Left mainstem bronchial stent - this was placed at [facility] consolidation of left lower lobe 3. Esophageal dilitation on CT scan - secondary to sub carinal mass. 4. A. Fib  On flecanide  5. DM 6. Anemia Hgb - 9.6 on 08/06/2014 7. Right pleural effusion 8.  Antibiotics:  Day 5 9/  DVT:  SCD only   Plan:  I will let Dr. Redmond Pulling see him but I think we could restart TF and see how he does.  Discussed with Dr. Redmond Pulling and OK to go forward with feeding thru the gastrostomy tube.     LOS: 4 days    Steve [name] 08/09/2014

## 2014-08-09 NOTE — Procedures (Signed)
Gtube injection confirms tube in the stomach. Neg for leak Stomach decompressed No comp

## 2014-08-10 ENCOUNTER — Encounter (HOSPITAL_COMMUNITY): Payer: Self-pay | Admitting: Physician Assistant

## 2014-08-10 ENCOUNTER — Ambulatory Visit: Payer: Medicare HMO | Admitting: Internal Medicine

## 2014-08-10 ENCOUNTER — Other Ambulatory Visit: Payer: Medicare HMO

## 2014-08-10 DIAGNOSIS — C3481 Malignant neoplasm of overlapping sites of right bronchus and lung: Secondary | ICD-10-CM

## 2014-08-10 DIAGNOSIS — E46 Unspecified protein-calorie malnutrition: Secondary | ICD-10-CM

## 2014-08-10 DIAGNOSIS — C3431 Malignant neoplasm of lower lobe, right bronchus or lung: Secondary | ICD-10-CM

## 2014-08-10 DIAGNOSIS — I480 Paroxysmal atrial fibrillation: Secondary | ICD-10-CM

## 2014-08-10 DIAGNOSIS — E43 Unspecified severe protein-calorie malnutrition: Secondary | ICD-10-CM

## 2014-08-10 DIAGNOSIS — I4891 Unspecified atrial fibrillation: Secondary | ICD-10-CM

## 2014-08-10 DIAGNOSIS — R0602 Shortness of breath: Secondary | ICD-10-CM

## 2014-08-10 DIAGNOSIS — Z931 Gastrostomy status: Secondary | ICD-10-CM

## 2014-08-10 LAB — BASIC METABOLIC PANEL
Anion gap: 6 (ref 5–15)
BUN: 11 mg/dL (ref 6–20)
CHLORIDE: 101 mmol/L (ref 101–111)
CO2: 29 mmol/L (ref 22–32)
Calcium: 8.5 mg/dL — ABNORMAL LOW (ref 8.9–10.3)
Creatinine, Ser: 0.67 mg/dL (ref 0.61–1.24)
GFR calc Af Amer: >60 mL/min (ref >60)
GLUCOSE: 133 mg/dL — AB (ref 65–99)
POTASSIUM: 3.2 mmol/L — AB (ref 3.5–5.1)
SODIUM: 136 mmol/L (ref 135–145)

## 2014-08-10 LAB — GLUCOSE, CAPILLARY
GLUCOSE-CAPILLARY: 120 mg/dL — AB (ref 65–99)
GLUCOSE-CAPILLARY: 178 mg/dL — AB (ref 65–99)
Glucose-Capillary: 126 mg/dL — ABNORMAL HIGH (ref 65–99)
Glucose-Capillary: 75 mg/dL (ref 65–99)
Glucose-Capillary: 130 mg/dL — ABNORMAL HIGH (ref 65–99)

## 2014-08-10 LAB — MAGNESIUM: Magnesium: 2 mg/dL (ref 1.7–2.4)

## 2014-08-10 MED ORDER — METOPROLOL TARTRATE 1 MG/ML IV SOLN
2.5000 mg | Freq: Once | INTRAVENOUS | Status: AC
  Administered 2014-08-10: 2.5 mg via INTRAVENOUS
  Filled 2014-08-10: qty 5

## 2014-08-10 MED ORDER — METOPROLOL TARTRATE 1 MG/ML IV SOLN
5.0000 mg | Freq: Four times a day (QID) | INTRAVENOUS | Status: DC
  Administered 2014-08-10 – 2014-08-12 (×7): 5 mg via INTRAVENOUS
  Filled 2014-08-10 (×8): qty 5

## 2014-08-10 MED ORDER — DEXTROSE 5 % IV SOLN
5.0000 mg/h | INTRAVENOUS | Status: DC
  Administered 2014-08-10: 5 mg/h via INTRAVENOUS
  Filled 2014-08-10: qty 100

## 2014-08-10 MED ORDER — POTASSIUM CHLORIDE 20 MEQ/15ML (10%) PO SOLN
40.0000 meq | Freq: Two times a day (BID) | ORAL | Status: AC
  Administered 2014-08-10 (×2): 40 meq
  Filled 2014-08-10 (×2): qty 30

## 2014-08-10 MED ORDER — POTASSIUM CHLORIDE CRYS ER 20 MEQ PO TBCR
40.0000 meq | EXTENDED_RELEASE_TABLET | Freq: Two times a day (BID) | ORAL | Status: DC
  Filled 2014-08-10: qty 2

## 2014-08-10 MED ORDER — SODIUM CHLORIDE 0.9 % IV BOLUS (SEPSIS)
250.0000 mL | Freq: Once | INTRAVENOUS | Status: AC
  Administered 2014-08-10: 250 mL via INTRAVENOUS

## 2014-08-10 MED ORDER — HYDROMORPHONE HCL 1 MG/ML PO LIQD
1.5000 mg | ORAL | Status: DC | PRN
  Administered 2014-08-10 – 2014-08-11 (×3): 1.5 mg via ORAL
  Filled 2014-08-10 (×3): qty 2

## 2014-08-10 MED ORDER — AMOXICILLIN-POT CLAVULANATE 400-57 MG/5ML PO SUSR
875.0000 mg | Freq: Two times a day (BID) | ORAL | Status: DC
  Administered 2014-08-10 – 2014-08-12 (×6): 875 mg via ORAL
  Filled 2014-08-10 (×10): qty 10.9

## 2014-08-10 MED ORDER — DILTIAZEM LOAD VIA INFUSION
10.0000 mg | Freq: Once | INTRAVENOUS | Status: AC
  Administered 2014-08-10: 10 mg via INTRAVENOUS
  Filled 2014-08-10: qty 10

## 2014-08-10 NOTE — Progress Notes (Signed)
Patient ID: Steve [name], male   DOB: 1940/12/24, 74 y.o.   MRN: [id number]    Subjective: Pt feels well today.  Tolerating his bolus TFs last night  Objective: Vital signs in last 24 hours: Temp:  [98.1 F (36.7 C)-98.4 F (36.9 C)] 98.1 F (36.7 C) (08/02 0439) Pulse Rate:  [110-136] 136 (08/02 0439) Resp:  [18-19] 18 (08/02 0439) BP: (110-128)/(56-81) 128/81 mmHg (08/02 0439) SpO2:  [91 %-97 %] 92 % (08/02 0439) Weight:  [71.94 kg (158 lb 9.6 oz)-72.1 kg (158 lb 15.2 oz)] 72.1 kg (158 lb 15.2 oz) (08/02 0439) Last BM Date: 08/09/14 (PTA Pt states)  Intake/Output from previous day: 08/01 0701 - 08/02 0700 In: 1430 [I.V.:1050; NG/GT:180; IV Piggyback:200] Out: 1050 [Urine:750; Drains:300] Intake/Output this shift:    PE: Abd: soft, NT, ND, +BS, g-tube in place  Lab Results:  No results for input(s): WBC, HGB, HCT, PLT in the last 72 hours. BMET No results for input(s): NA, K, CL, CO2, GLUCOSE, BUN, CREATININE, CALCIUM in the last 72 hours. PT/INR No results for input(s): LABPROT, INR in the last 72 hours. CMP     Component Value Date/Time   NA 134* 08/06/2014 0430   NA 137 07/06/2014 1325   NA 142 04/28/2013 0819   K 3.5 08/06/2014 0430   K 4.3 07/06/2014 1325   CL 98* 08/06/2014 0430   CO2 29 08/06/2014 0430   CO2 25 07/06/2014 1325   GLUCOSE 153* 08/06/2014 0430   GLUCOSE 185* 07/06/2014 1325   GLUCOSE 103* 04/28/2013 0819   BUN 11 08/06/2014 0430   BUN 18.4 07/06/2014 1325   BUN 12 04/28/2013 0819   CREATININE 0.56* 08/06/2014 0430   CREATININE 0.8 07/06/2014 1325   CALCIUM 8.8* 08/06/2014 0430   CALCIUM 9.2 07/06/2014 1325   PROT 6.0* 08/06/2014 0430   PROT 7.2 07/06/2014 1325   PROT 6.8 04/28/2013 0819   ALBUMIN 2.2* 08/06/2014 0430   ALBUMIN 2.9* 07/06/2014 1325   AST 20 08/06/2014 0430   AST 15 07/06/2014 1325   ALT 30 08/06/2014 0430   ALT 13 07/06/2014 1325   ALKPHOS 85 08/06/2014 0430   ALKPHOS 86 07/06/2014 1325   BILITOT 0.3 08/06/2014  0430   BILITOT 0.50 07/06/2014 1325   GFRNONAA >60 08/06/2014 0430   GFRAA >60 08/06/2014 0430   Lipase     Component Value Date/Time   LIPASE 59.0 01/28/2014 1725       Studies/Results: Ir Cm Inj Any Colonic Tube W/fluoro  08/09/2014   CLINICAL DATA:  BALLOON RETENTION GASTROSTOMY, PNEUMOPERITONEUM BY RECENT CT 08/05/2014. Assess for gastrostomy position and leakage.  EXAM: GI TUBE INJECTION  COMPARISON:  08/05/2014 CT  FINDINGS: Under fluoroscopy, the existing balloon retention gastrostomy was injected with contrast. This confirms position within the stomach. Stomach is decompressed. No evidence of leakage or extravasation. No obstruction.  IMPRESSION: Gastrostomy within the stomach.  No complicating feature.   Electronically Signed   By: Jerilynn Mages.  Shick M.D.   On: 08/09/2014 14:56    Anti-infectives: Anti-infectives    Start     Dose/Rate Route Frequency Ordered Stop   08/10/14 1200  amoxicillin-clavulanate (AUGMENTIN) 400-57 MG/5ML suspension 875 mg     875 mg Oral 2 times daily 08/10/14 0935 08/14/14 1159   08/08/14 1400  piperacillin-tazobactam (ZOSYN) IVPB 3.375 g  Status:  Discontinued     3.375 g 12.5 mL/hr over 240 Minutes Intravenous 3 times per day 08/08/14 1135 08/10/14 0935   08/06/14 1800  piperacillin-tazobactam (  ZOSYN) IVPB 3.375 g  Status:  Discontinued     3.375 g 100 mL/hr over 30 Minutes Intravenous 4 times per day 08/06/14 1621 08/08/14 1135   08/06/14 0200  piperacillin-tazobactam (ZOSYN) IVPB 3.375 g  Status:  Discontinued     3.375 g 12.5 mL/hr over 240 Minutes Intravenous Every 8 hours 08/06/14 0022 08/06/14 1621   08/05/14 1830  piperacillin-tazobactam (ZOSYN) IVPB 3.375 g     3.375 g 100 mL/hr over 30 Minutes Intravenous  Once 08/05/14 1818 08/05/14 1926       Assessment/Plan  1. Pneumoperitoneum 1A. G tube placed by IR - J. Wagner on 07/27/2014 for This was placed because the 5.5 cm subcarinal mass appears to be impinging on the esophagus and  obstructing it. -TFs resumed with feeding boluses.  Tolerating these well.   -surgically stable for DC when medically stable. -we will sign off. 2. Metastatic non small cell lung cancer - sees Dr. Mayme Genta Left mainstem bronchial stent - this was placed at [facility] consolidation of left lower lobe 3. Esophageal dilitation on CT scan - secondary to sub carinal mass. 4. A. Fib On flecanide  5. DM 6. Anemia 7. Right pleural effusion 8.  DVT: SCDs  LOS: 5 days    Jaydon Soroka E 08/10/2014, 9:38 AM Pager: [phone]

## 2014-08-10 NOTE — Progress Notes (Signed)
Daily Progress Note   Patient Name: Steve [name]       Date: 08/10/2014 DOB: [birth year]  Age: 74 y.o. MRN#: [id number] Attending Physician: Charlynne Cousins, MD Primary Care Physician: [name], MD Admit Date: 08/05/2014  Reason for Consultation/Follow-up: Establishing goals of care  Subjective: Met with patient and his wife.  He reports his pain has been better controlled over the last 24 hours.  His wife is concerned as he has been told he has a new mass in his lung, but he is not scheduled to see his oncologist until August 22.  They do not want to have any further discussions regarding his long term plan of care until they are able to speak with Dr. Neil Crouch. Otherwise, he denies complaints.  Interval Events: Cardiology consulted for Flecanide  Length of Stay: 5 days  Current Medications: Scheduled Meds:  . albuterol  3 mL Inhalation BID  . amoxicillin-clavulanate  875 mg Oral q12n4p  . antiseptic oral rinse  7 mL Mouth Rinse q12n4p  . chlorhexidine  15 mL Mouth Rinse BID  . feeding supplement (OSMOLITE 1.5 CAL)  237 mL Per Tube QID  . feeding supplement (PRO-STAT SUGAR FREE 64)  30 mL Per Tube BID  . flecainide  100 mg Per Tube BID  . insulin aspart  0-15 Units Subcutaneous TID WC  . metoprolol  2.5 mg Intravenous 4 times per day  . pantoprazole sodium  40 mg Per Tube Daily  . potassium chloride  40 mEq Per Tube BID    Continuous Infusions: . dextrose 5 % and 0.9% NaCl 75 mL/hr at 08/10/14 0559    PRN Meds: acetaminophen **OR** acetaminophen, free water, HYDROmorphone HCl, lidocaine, ondansetron **OR** ondansetron (ZOFRAN) IV, oxyCODONE, sodium chloride, temazepam   Vital Signs: BP 128/81 mmHg  Pulse 136  Temp(Src) 98.1 F (36.7 C) (Oral)  Resp 18  Ht $R'5\' 11"'Ak$  (1.803 m)  Wt 72.1 kg (158 lb 15.2 oz)  BMI 22.18 kg/m2  SpO2 91% SpO2: SpO2: 91 % O2 Device: O2 Device: Not Delivered O2 Flow Rate: O2 Flow Rate (L/min): 1 L/min  Intake/output summary:    Intake/Output Summary (Last 24 hours) at 08/10/14 1026 Last data filed at 08/10/14 0747  Gross per 24 hour  Intake   1430 ml  Output    900 ml  Net    530 ml   LBM:   Baseline Weight: Weight: 74.8 kg (164 lb 14.5 oz) Most recent weight: Weight: 72.1 kg (158 lb 15.2 oz)  Physical Exam: GEN: [name], lying in bed, no acute distress ENT: MMM, no scleral icterus Resp: Normal depth and effort Skin: Warm and dry               Additional Data Reviewed: No results for input(s): WBC, HGB, PLT, NA, BUN, CREATININE, ALB in the last 72 hours.   Problem List:  Patient Active Problem List   Diagnosis Date Noted  . Encounter for palliative care   . Pneumoperitoneum 08/06/2014  . Protein-calorie malnutrition, severe 08/06/2014  . Gastrostomy in place 08/05/2014  . Bronchial stenosis s/p stenting 08/05/2014 08/05/2014  . Pneumoperitoneum of unknown etiology 08/05/2014  . Esophageal stricture 07/26/2014  . Odynophagia 07/25/2014  . Atrial fibrillation with rapid ventricular response 07/24/2014  . HCAP (healthcare-associated pneumonia) 07/15/2014  . Malnutrition of moderate degree 07/15/2014  . Erosive esophagitis on recent endoscopy 07/15/2014  . Dyspnea 07/14/2014  . Dysphagia, pharyngoesophageal phase 06/14/2014  . Malignant neoplasm of lower lobe of  right lung 01/28/2014  . Pleural effusion, right 01/28/2014  . ILD (interstitial lung disease) 01/28/2014  . Wheezing 07/15/2013  . Insomnia 07/15/2013  . Neuropathic pain 04/29/2013  . PAF- on Flecainide,  04/29/2013  . Chronic pain disorder 04/29/2013  . DM type 2 with diabetic peripheral neuropathy 04/29/2013  . Primary cancer of right lower lobe of lung 12/25/2012  . Subclinical hypothyroidism 11/19/2012  . Need for prophylactic vaccination and inoculation against influenza 09/20/2012  . Reactive airway disease 08/19/2012  . Benign paroxysmal positional vertigo 07/29/2012  . Impotence of organic origin   . Osteoarthritis,  shoulder      Palliative Care Assessment & Plan    Code Status:  DNR  Goals of Care:  Patient reports that he has been waiting to discuss his care plan with his Oncologist.  He understands that he has a new mass in his left lung.  He is waiting to discuss options for treatment with his oncologist.  Declined further discussion at this time.  Dr. Rowe Pavy called Dr. Eddie North nurse to discuss plans for follow-up once he is discharged.  His oncologist will work on having him scheduled for an appointment once his discharge date is determined.    3. Symptom Management (Pain, SOB):  Reports that he has been doing well on current narcotic regimen.  May need to consider addition of long acting medication on discharge.  He has been prescribed Oxycontin in the past, but had trouble getting it due to insurance coverage.  4. Palliative Prophylaxis:  Stool Softner: None currently.  Will follow as patient on tube feeds which may loosen stools.  5. Prognosis: < 6 months  5. Discharge Planning: To be determined   Care plan was discussed with Dr. Rowe Pavy (palliative), patient, and his wife.  Thank you for allowing the Palliative Medicine Team to assist in the care of this patient.   Time In: 10:00 Time Out: 10:25 Total Time 25 mins Prolonged Time Billed  no     Greater than 50%  of this time was spent counseling and coordinating care related to the above assessment and plan.   Micheline Rough, MD  08/10/2014, 10:26 AM  Please contact Palliative Medicine Team phone at [phone] for questions and concerns.

## 2014-08-10 NOTE — Progress Notes (Signed)
Dr. Radford Pax on the floor rounding given update regarding Pt's drop in BP after starting Cardizem Drip. Last BP 90/66 with Pulse of 110. Per New Orders will stop Cardizem Drip. Continue to monitor Pt closely and maintain current plan of care. Pt and Wife updated.

## 2014-08-10 NOTE — Progress Notes (Signed)
Subjective: The patient is seen and examined today. His wife was at the bedside. He is feeling fine except for the retrosternal pain as well as shortness breath with exertion. He was recently seen by Dr. Kerin Ransom at [facility] and had bronchitis stent placed. During the bronchoscopy was found to have tumor extension to the left lower lobe. He was readmitted to [facility] with atrial fibrillation and rapid ventricular response. CT of the chest, abdomen and pelvis showed large volume of 3 intraperitoneal air with no focal abnormality to identify a perforated viscus. There was also stopped continuous air in the left upper quadrant and in the lower left chest. There was also to a nearby based tracking along the percutaneous gastrostomy tube within the anterior abdominal wall suggesting the possibility of the percutaneous gastrostomy being the etiology of the free air. In the chest the left mainstem bronchus stent. To be satisfactory in position. There was new consolidation of the left lower lobe basal segments. There was also worsened esophageal dilatation with air-fluid level probably due to compromise of the esophagus by the subcarinal mass with a slight interval enlargement of the moderate right pleural effusion. The patient and his wife requested me to see him today for evaluation of his condition and discussion of his treatment options.  Objective: Vital signs in last 24 hours: Temp:  [98 F (36.7 C)-98.6 F (37 C)] 98.6 F (37 C) (08/02 1433) Pulse Rate:  [101-136] 109 (08/02 1433) Resp:  [18-20] 20 (08/02 1433) BP: (82-128)/(57-83) 102/62 mmHg (08/02 1433) SpO2:  [91 %-95 %] 93 % (08/02 1433) Weight:  [158 lb 15.2 oz (72.1 kg)] 158 lb 15.2 oz (72.1 kg) (08/02 0439)  Intake/Output from previous day: 08/01 0701 - 08/02 0700 In: 1430 [I.V.:1050; NG/GT:180; IV Piggyback:200] Out: 1100 [Urine:750; Drains:350] Intake/Output this shift: Total I/O In: 634 [I.V.:10; NG/GT:624] Out: 450  [Urine:450]  General appearance: alert, cooperative, fatigued and no distress Resp: rhonchi bilaterally and wheezes bilaterally Cardio: regular rate and rhythm, S1, S2 normal, no murmur, click, rub or gallop GI: soft, non-tender; bowel sounds normal; no masses,  no organomegaly Extremities: extremities normal, atraumatic, no cyanosis or edema  Lab Results:  No results for input(s): WBC, HGB, HCT, PLT in the last 72 hours. BMET  Recent Labs  08/10/14 1110  NA 136  K 3.2*  CL 101  CO2 29  GLUCOSE 133*  BUN 11  CREATININE 0.67  CALCIUM 8.5*    Studies/Results: Ir Cm Inj Any Colonic Tube W/fluoro  08/09/2014   CLINICAL DATA:  BALLOON RETENTION GASTROSTOMY, PNEUMOPERITONEUM BY RECENT CT 08/05/2014. Assess for gastrostomy position and leakage.  EXAM: GI TUBE INJECTION  COMPARISON:  08/05/2014 CT  FINDINGS: Under fluoroscopy, the existing balloon retention gastrostomy was injected with contrast. This confirms position within the stomach. Stomach is decompressed. No evidence of leakage or extravasation. No obstruction.  IMPRESSION: Gastrostomy within the stomach.  No complicating feature.   Electronically Signed   By: Jerilynn Mages.  Shick M.D.   On: 08/09/2014 14:56    Medications: I have reviewed the patient's current medications.  CODE STATUS: No CODE BLUE.  Assessment/Plan: 1) metastatic non-small cell lung cancer, squamous cell carcinoma status post course of concurrent chemoradiation as well as several systemic chemotherapy regimens most recently treated with single agent gemcitabine. Unfortunately the patient continues to have evidence for disease progression and now has evidence of spread of his tumor to the left lung. She status post left mainstem stent placement at [facility]. I had a  lengthy discussion with the patient and his wife today about his current condition and treatment options. Unfortunately the patient has very poor prognosis and average survival of less than 3 months with  no further treatment. He currently has a lot of medical issues including the worsening dyspnea as well as esophagitis, malnutrition and atrial fibrillation with rapid ventricular rate. He is not a good candidate for systemic chemotherapy at this point but this could be consider in the future if she has improvement in his performance status. I strongly recommended for the patient to consider palliative care approach at this point. I will arrange for him a follow-up appointment with me at the [facility] after discharge for more detailed discussion of his treatment options. 2) shortness of breath: Secondary to progressive non-small cell lung cancer, status post left mainstem stent placement. 3) atrial fibrillation with rapid ventricular rate: He is followed by cardiology. Continue current treatment. 4) malnutrition: Continue PEG tube feeding. Thank you for taking good care of Mr. Steve [name], I will continue to follow up the patient with you and assist in his management on as-needed basis. Please call if you have any questions.       LOS: 5 days    Jamier Urbas K. 08/10/2014

## 2014-08-10 NOTE — Progress Notes (Signed)
[facility] PROGRESS NOTE   Assessment/Plan: Pneumoperitoneum/leukytosis: CT abdomen pelvis on admission showed 2 air bubbles noted tracking along the PEG tube. G-tube was placed due to a 5.5 cm subcarinal mass impinging on the esophagus. - IR was consulted they agreed pneumopericardium most likely due to PEG tube. Plan for fluoroscopic study on 08/09/2014 to evaluate function a G-tube. - Continue Zosyn per surgery recommendation, de-escalate treatment to Unasyn. - Has been getting his IV Dilaudid around-the-clock.  Metastatic non-small cell cancer: Systemic chemotherapy as an outpatient.  A. fib with RVR/ PAF- on Flecainide, : CHADS2 vasc score at least 3 On admission he was started on IV diltiazem and low-dose metoprolol iv, probably need her metoprolol. He spontaneously converted to sinus rhythm 08/07/2014, now back in a fib with RVR. On daily aspirin, will consult cardiology for flecanaide.  Dysphagia: Extrinsic compression of the esophagus.  Anemia due to chemotherapy/  Primary cancer of right lower lobe of lung: Hemoglobin seems to be stable.  Severe protein caloric malnutrition: As a consequence of chronic disease, continue ensure.  Protein-calorie malnutrition, severe   Code Status: DNR/DNI Family Communication: plan of care discussed with the patient Disposition Plan: Plan for fluoroscopic study on Monday to evaluate the function / placement of G-tube   Consultants:  IR  surgery  Procedures: Ct Chest W Contrast 08/05/2014 1. Large volume free intraperitoneal air. No focal abnormality is evident to identify a perforated viscus. 2. There is subcutaneous air in the left upper quadrant and in the lower left chest. There also are 2 air bubbles tracking along the percutaneous gastrostomy tube within the anterior abdominal wall. This suggests the possibility of the percutaneous gastrostomy being the etiology of the free air, but it is not conclusive. The  gastrostomy device itself appears to be satisfactorily positioned. 3. In the chest, the left mainstem bronchial stent appears to be satisfactorily positioned. There is new consolidation of left lower lobe basal segments. There is worsened esophageal dilatation with air-fluid level, probably due to compromise of the esophagus by the sub- carinal mass. There is slight interval enlargement of a moderate right pleural effusion. Electronically Signed By: Andreas Newport M.D. On: 08/05/2014 21:11   Ct Abdomen Pelvis W Contrast 08/05/2014 1. Large volume free intraperitoneal air. No focal abnormality is evident to identify a perforated viscus. 2. There is subcutaneous air in the left upper quadrant and in the lower left chest. There also are 2 air bubbles tracking along the percutaneous gastrostomy tube within the anterior abdominal wall. This suggests the possibility of the percutaneous gastrostomy being the etiology of the free air, but it is not conclusive. The gastrostomy device itself appears to be satisfactorily positioned. 3. In the chest, the left mainstem bronchial stent appears to be satisfactorily positioned. There is new consolidation of left lower lobe basal segments. There is worsened esophageal dilatation with air-fluid level, probably due to compromise of the esophagus by the sub- carinal mass. There is slight interval enlargement of a moderate right pleural effusion. Electronically Signed By: Andreas Newport M.D. On: 08/05/2014 21:11   Dg Abd Acute W/chest 08/05/2014 Free air. Further evaluation with CT of abdomen pelvis is recommended.   Antibiotics:  Zosyn since admission.  HPI/Subjective: No complains  Objective: Filed Vitals:   08/09/14 1500 08/09/14 2031 08/09/14 2119 08/10/14 0439  BP:   123/75 128/81  Pulse:   132 136  Temp:   98.4 F (36.9 C) 98.1 F (36.7 C)  TempSrc:   Oral Oral  Resp:  18 18  Height:      Weight: 71.94 kg (158 lb 9.6 oz)   72.1 kg (158  lb 15.2 oz)  SpO2:  91% 94% 92%    Intake/Output Summary (Last 24 hours) at 08/10/14 0934 Last data filed at 08/10/14 0747  Gross per 24 hour  Intake   1430 ml  Output    900 ml  Net    530 ml   Filed Weights   08/07/14 0400 08/09/14 1500 08/10/14 0439  Weight: 74.8 kg (164 lb 14.5 oz) 71.94 kg (158 lb 9.6 oz) 72.1 kg (158 lb 15.2 oz)    Exam:  General: Alert, awake, oriented x3, in no acute distress.  HEENT: No bruits, no goiter.  Heart: Regular rate and rhythm. Lungs: Good air movement, clear Abdomen: Soft, nontender, nondistended, positive bowel sounds.  Neuro: Grossly intact, nonfocal.   Data Reviewed: Basic Metabolic Panel:  Recent Labs Lab 08/05/14 1825 08/06/14 0430  NA 133* 134*  K 3.6 3.5  CL 94* 98*  CO2 29 29  GLUCOSE 168* 153*  BUN 10 11  CREATININE 0.57* 0.56*  CALCIUM 9.5 8.8*   Liver Function Tests:  Recent Labs Lab 08/05/14 1825 08/06/14 0430  AST 24 20  ALT 38 30  ALKPHOS 97 85  BILITOT 0.4 0.3  PROT 6.7 6.0*  ALBUMIN 2.5* 2.2*   No results for input(s): LIPASE, AMYLASE in the last 168 hours. No results for input(s): AMMONIA in the last 168 hours. CBC:  Recent Labs Lab 08/05/14 1825 08/06/14 0430  WBC 12.1* 11.4*  NEUTROABS 11.0*  --   HGB 10.4* 9.6*  HCT 32.9* 30.2*  MCV 90.4 90.7  PLT 613* 579*   Cardiac Enzymes: No results for input(s): CKTOTAL, CKMB, CKMBINDEX, TROPONINI in the last 168 hours. BNP (last 3 results)  Recent Labs  07/14/14 1916  BNP 333.5*    ProBNP (last 3 results) No results for input(s): PROBNP in the last 8760 hours.  CBG:  Recent Labs Lab 08/09/14 1635 08/09/14 2116 08/09/14 2356 08/10/14 0434 08/10/14 0737  GLUCAP 82 149* 96 120* 126*    Recent Results (from the past 240 hour(s))  MRSA PCR Screening     Status: None   Collection Time: 08/06/14  2:34 AM  Result Value Ref Range Status   MRSA by PCR NEGATIVE NEGATIVE Final    Comment:        The GeneXpert MRSA Assay  (FDA approved for NASAL specimens only), is one component of a comprehensive MRSA colonization surveillance program. It is not intended to diagnose MRSA infection nor to guide or monitor treatment for MRSA infections.      Studies: Ir Cm Inj Any Colonic Tube W/fluoro  08/09/2014   CLINICAL DATA:  BALLOON RETENTION GASTROSTOMY, PNEUMOPERITONEUM BY RECENT CT 08/05/2014. Assess for gastrostomy position and leakage.  EXAM: GI TUBE INJECTION  COMPARISON:  08/05/2014 CT  FINDINGS: Under fluoroscopy, the existing balloon retention gastrostomy was injected with contrast. This confirms position within the stomach. Stomach is decompressed. No evidence of leakage or extravasation. No obstruction.  IMPRESSION: Gastrostomy within the stomach.  No complicating feature.   Electronically Signed   By: Jerilynn Mages.  Shick M.D.   On: 08/09/2014 14:56    Scheduled Meds: . albuterol  3 mL Inhalation BID  . antiseptic oral rinse  7 mL Mouth Rinse q12n4p  . chlorhexidine  15 mL Mouth Rinse BID  . feeding supplement (OSMOLITE 1.5 CAL)  237 mL Per Tube QID  . feeding supplement (  PRO-STAT SUGAR FREE 64)  30 mL Per Tube BID  . flecainide  100 mg Per Tube BID  . insulin aspart  0-15 Units Subcutaneous TID WC  . metoprolol  2.5 mg Intravenous 4 times per day  . pantoprazole sodium  40 mg Per Tube Daily  . piperacillin-tazobactam (ZOSYN)  IV  3.375 g Intravenous 3 times per day   Continuous Infusions: . dextrose 5 % and 0.9% NaCl 75 mL/hr at 08/10/14 0559    Time Spent: 25 min   Charlynne Cousins  [facility] Pager [phone]. If 7PM-7AM, please contact night-coverage at [email or web address], password [facility] 08/10/2014, 9:34 AM  LOS: 5 days

## 2014-08-10 NOTE — Progress Notes (Signed)
Per MD Orders Cardizem Bolus given and Cardizem Drip initiated at 5 mg/hr. Second BP check showed a decrease in BP from 110/83 with Pulse of 113 to 83/64 and 80/60 manually. Heart rate 102. Pt with no other acute changes in assessment at this time. NP paged for Cardiology and awaiting call back

## 2014-08-10 NOTE — Consult Note (Addendum)
CARDIOLOGY CONSULT NOTE   Patient ID: Steve [name] MRN: [id number] DOB/AGE: 73-Jun-1942 73 y.o.  Admit date: 08/05/2014  Primary Physician   GREEN, Viviann Spare, MD Primary Cardiologist   Dr Harrington Challenger Reason for Consultation   PAF  EPP:IRJJOAC Steve [name] is a 74 y.o. year old male with a history of PAF on Flecainide, not on coumadin. Hx metastatic [place] [facility] CA. Seen by Dr Harrington Challenger 07/25 and volume was OK, plan for event monitor. Lung CA was causing problems with RLL mass.   He had a bronchial stent placed 07/28 at [facility]. Was notified he had air in his abdomen on the way home and was admitted. Large volume free air LUQ and Steve lower chest.   He was in afib, RVR on admission. HR has been elevated since admission. He has been on a Cardizem drip. He has not been anticoagulated, has been on ASA. He spontaneously converted to SR. Now back in afib, Cards consult re: continuing Flecainide.   Steve [name] does not feel the episodes of afib. At baseline, his HR is about 100. He has regular spikes of rapid afib/ SVT, but they are not lasting that long. He denies any change in his SOB, no palpitations, no chest pain. He had diaphoresis in December, when his HR was very high, sustained, but not here.   His DOE is significant, allowing him to ambulate a little in the room and in the hallway for short distances. His other symptom was syncope. He has not had that either.     Past Medical History  Diagnosis Date  . Dizziness and giddiness     positional vertigo  . Hyperlipidemia   . Impotence of organic origin   . Osteoarthritis, shoulder   . Allergy   . Hypothyroidism     pt denies thryoid disease, dr pandey note 11-19-12 says subclinical hypothryoid in epic  . COPD (chronic obstructive pulmonary disease)     pt denies  . Insomnia   . Hx of radiation therapy 02/2013    right lung  . Diabetes mellitus without complication     pt denies diabetes, dr pandey note says dm 11-19-12 epic  . 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 Malignant neoplasm of bladder, part unspecified   . Malignant neoplasm of bronchus and lung, unspecified site   . PAF (paroxysmal atrial fibrillation)     a. not on anticoag due to oncologic issues, on flecainide.  . Paroxysmal SVT (supraventricular tachycardia)   . Chronic respiratory failure   . Esophageal stricture   . Esophagitis   . Reactive airway disease   . Acute URI 07/29/2012  . Syncope 12/21/2013    Jan 2016 with documented orthostatic B/P and rapid PSVT on holter   . Ethmoid sinusitis 03/10/2014  . Orthostatic hypotension-on Midodrine 07/15/2014     Past Surgical History  Procedure Laterality Date  . Cystoscopy w/ dilation of bladder    . Tonsillectomy  age 89  . Endobronchial ultrasound Bilateral 12/15/2012    Procedure: ENDOBRONCHIAL ULTRASOUND;  Surgeon: Brand Males, MD;  Location: [facility];  Service: Cardiopulmonary;  Laterality: Bilateral;  . Colonoscopy  2009    [facility]    Allergies  Allergen Reactions  . Morphine Hives    Tolerated dilaudid on multiple occasions.  Takes OxyContin at home  . Morphine And Related Hives    Tolerated dilaudid on multiple occasions.  Takes OxyContin at home  I have reviewed the patient's current medications . albuterol  3 mL Inhalation BID  . amoxicillin-clavulanate  875 mg Oral q12n4p  . antiseptic oral rinse  7 mL Mouth Rinse q12n4p  . chlorhexidine  15 mL Mouth Rinse BID  . feeding supplement (OSMOLITE 1.5 CAL)  237 mL Per Tube QID  . feeding supplement (PRO-STAT SUGAR FREE 64)  30 mL Per Tube BID  . flecainide  100 mg Per Tube BID  . insulin aspart  0-15 Units Subcutaneous TID WC  . metoprolol  2.5 mg Intravenous 4 times per day  . pantoprazole sodium  40 mg Per Tube Daily  . potassium chloride  40 mEq Per Tube BID   . dextrose 5 % and 0.9% NaCl 75 mL/hr at 08/10/14 0559   acetaminophen **OR** acetaminophen, free water, HYDROmorphone HCl, lidocaine, ondansetron **OR**  ondansetron (ZOFRAN) IV, oxyCODONE, sodium chloride, temazepam  Medication Sig  albuterol (PROVENTIL) (5 MG/ML) 0.5% nebulizer solution Inhale 0.5 mLs into the lungs 2 (two) times daily.  aspirin 81 MG tablet Place 1 tablet (81 mg total) into feeding tube daily.  diltiazem (CARDIZEM) 90 MG tablet Take 1 tablet (90 mg total) by mouth every 6 (six) hours.  flecainide (TAMBOCOR) 100 MG tablet Place 1 tablet (100 mg total) into feeding tube 2 (two) times daily.  guaiFENesin (MUCINEX) 600 MG 12 hr tablet Take 600 mg by mouth every 12 (twelve) hours as needed for cough.  lactose free nutrition (BOOST PLUS) LIQD Take 237 mLs by mouth 2 (two) times daily between meals.  lidocaine (XYLOCAINE) 2 % solution Use as directed 20 mLs in the mouth or throat every 6 (six) hours as needed for mouth pain.  Nutritional Supplements (FEEDING SUPPLEMENT, OSMOLITE 1.5 CAL,) LIQD Place 237 mLs into feeding tube 4 (four) times daily.  omeprazole (PRILOSEC) 40 MG capsule Take 40 mg by mouth 2 (two) times daily.   oxyCODONE (ROXICODONE) 5 MG/5ML solution Take 5 mLs (5 mg total) by mouth every 4 (four) hours as needed for moderate pain.  polyethylene glycol (MIRALAX / GLYCOLAX) packet Take 17 g by mouth daily as needed for mild constipation or moderate constipation.   simvastatin (ZOCOR) 20 MG tablet Place 1 tablet (20 mg total) into feeding tube daily. Take one tablet by mouth once daily to lower cholesterol  sodium chloride HYPERTONIC 3 % nebulizer solution Inhale 3 mLs into the lungs 2 (two) times daily.  temazepam (RESTORIL) 30 MG capsule Place 1 capsule (30 mg total) into feeding tube at bedtime as needed for sleep.  Water For Irrigation, Sterile (FREE WATER) SOLN Place 120 mLs into feeding tube 4 (four) times daily.  Amino Acids-Protein Hydrolys (FEEDING SUPPLEMENT, PRO-STAT SUGAR FREE 64,) LIQD Place 30 mLs into feeding tube 2 (two) times daily. Patient not taking: Reported on 08/05/2014  pantoprazole sodium (PROTONIX)  40 mg/20 mL PACK Place 40 mLs (80 mg total) into feeding tube daily. Patient not taking: Reported on 08/05/2014     History   Social History  . Marital Status: Married    Spouse Name: N/A  . Number of Children: 3  . Years of Education: N/A   Occupational History  . business owner    Social History Main Topics  . Smoking status: Former Smoker -- 1.50 packs/day for 50 years    Types: Cigarettes    Quit date: 01/09/2007  . Smokeless tobacco: Former Systems developer    Types: Chew    Quit date: 05/28/2014  . Alcohol Use: No  . Drug  Use: No  . Sexual Activity: Not on file   Other Topics Concern  . Not on file   Social History Narrative   Lives with wife, who helps care for him.    Family Status  Relation Status Death Age  . Mother Deceased   . Father Deceased   . Sister Alive   . Brother Deceased   . Daughter Alive   . Son Deceased   . Sister Alive   . Daughter Alive   . Maternal Grandmother Deceased   . Maternal Grandfather Deceased   . Paternal Grandmother Deceased   . Paternal Grandfather Deceased    Family History  Problem Relation Age of Onset  . Kidney disease Brother   . Hypertension Mother   . Diabetes Mother   . Cancer Father     type unknown  . Stroke Father      ROS:  Full 14 point review of systems complete and found to be negative unless listed above.  Physical Exam: Blood pressure 128/81, pulse 136, temperature 98.1 F (36.7 C), temperature source Oral, resp. rate 18, height '5\' 11"'$  (1.803 m), weight 158 lb 15.2 oz (72.1 kg), SpO2 91 %.  General: Well developed, well nourished, male in no acute distress, significant DOE Head: Eyes PERRLA, No xanthomas.   Normocephalic and atraumatic, oropharynx without edema or exudate. Dentition: good Lungs: very decreased BS bases with rales, Steve>R Heart: Heart irregular rate and rhythm with S1, S2, no murmur. pulses are 2+ all 4 extrem.   Neck: No carotid bruits. No lymphadenopathy.  JVD not elevated. Abdomen: Bowel  sounds present, abdomen soft and non-tender without masses or hernias noted. Msk:  No spine or cva tenderness. No weakness, no joint deformities or effusions. Extremities: No clubbing or cyanosis. No edema.  Neuro: Alert and oriented X 3. No focal deficits noted. Psych:  Good affect, responds appropriately Skin: No rashes or lesions noted.  Labs:   Lab Results  Component Value Date   WBC 11.4* 08/06/2014   HGB 9.6* 08/06/2014   HCT 30.2* 08/06/2014   MCV 90.7 08/06/2014   PLT 579* 08/06/2014   No results for input(s): INR in the last 72 hours.   Recent Labs Lab 08/06/14 0430  NA 134*  K 3.5  CL 98*  CO2 29  BUN 11  CREATININE 0.56*  CALCIUM 8.8*  PROT 6.0*  BILITOT 0.3  ALKPHOS 85  ALT 30  AST 20  GLUCOSE 153*  ALBUMIN 2.2*   Echo: 07/15/2014 - Left ventricle: The cavity size was normal. There was mild concentric hypertrophy. Systolic function was normal. The estimated ejection fraction was in the range of 55% to 60%. Wall motion was normal; there were no regional wall motion abnormalities. - Mitral valve: Calcified annulus.  ECG:  08/08/2014 Atrial fib, RVR Rate 144  Radiology:  Ir Cm Inj Any Colonic Tube W/fluoro  08/09/2014   CLINICAL DATA:  BALLOON RETENTION GASTROSTOMY, PNEUMOPERITONEUM BY RECENT CT 08/05/2014. Assess for gastrostomy position and leakage.  EXAM: GI TUBE INJECTION  COMPARISON:  08/05/2014 CT  FINDINGS: Under fluoroscopy, the existing balloon retention gastrostomy was injected with contrast. This confirms position within the stomach. Stomach is decompressed. No evidence of leakage or extravasation. No obstruction.  IMPRESSION: Gastrostomy within the stomach.  No complicating feature.   Electronically Signed   By: Jerilynn Mages.  Shick M.D.   On: 08/09/2014 14:56    ASSESSMENT AND PLAN:   The patient was seen today by Dr Radford Pax, the patient evaluated and  the data reviewed.   PAF- on Flecainide,   Atrial fibrillation with rapid ventricular response  episodic.  He feels that the flecainide has helped since going on it. - Feel episodes are related to acute illness and has been in afib since admission but cannot tell he is in it so could have been longer - was on Cardizem 90 mg q 6 h initially and then stopped, Will start IV Cardizem gtt to try to get acute HR control - Increase IV metoprolol to '5mg'$  q6 hrs.   - will continue flecainide for now since he is in and out of PAF and hopefully will be more effective at maintaining NSR once acute illness improved but with ongoing lung issues this may become more frequent.  Will set up outpt EP evaluation to consider other antiarrhythmic.  Could try amio but with underlying lung disease would like to try to avoid this.  Not on anticoagulation, continue ASA  Otherwise, per IM and consults. Principal Problem:   Pneumoperitoneum of unknown etiology Active Problems:   Primary cancer of right lower lobe of lung   Wheezing   Pleural effusion, right   Dysphagia, pharyngoesophageal phase   Dyspnea   Malnutrition of moderate degree   Erosive esophagitis on recent endoscopy   Odynophagia   Esophageal stricture   Gastrostomy in place   Pneumoperitoneum   Protein-calorie malnutrition, severe   Encounter for palliative care   Signed: Lenoard Aden 08/10/2014 10:41 AM Beeper [phone]  Co-Sign MD  Patient seen and examined with Rosaria Ferries PA . We discussed all aspects of the encounter. I agree with the assessment and plan as stated above with minor changes.  He has been in and out of afib since admission and was in NSR with PAC's earlier today.  Will continue Flecainide for now.   Apparently he has not been deemed to be a candidate for anticoagulation in the past due to underlying malignancy.  Increase metoprolol to '5mg'$  IV q6hrs and start IV Cardizem gtt for better rate control.  Await palliative care and oncology recs on diagnosis of new lung mass.    Signed: Fransico Him, MD [facility] 08/10/2014

## 2014-08-11 ENCOUNTER — Telehealth: Payer: Self-pay | Admitting: Medical Oncology

## 2014-08-11 DIAGNOSIS — Z515 Encounter for palliative care: Secondary | ICD-10-CM | POA: Insufficient documentation

## 2014-08-11 DIAGNOSIS — Z789 Other specified health status: Secondary | ICD-10-CM

## 2014-08-11 LAB — GLUCOSE, CAPILLARY
GLUCOSE-CAPILLARY: 94 mg/dL (ref 65–99)
Glucose-Capillary: 102 mg/dL — ABNORMAL HIGH (ref 65–99)
Glucose-Capillary: 111 mg/dL — ABNORMAL HIGH (ref 65–99)
Glucose-Capillary: 113 mg/dL — ABNORMAL HIGH (ref 65–99)
Glucose-Capillary: 145 mg/dL — ABNORMAL HIGH (ref 65–99)

## 2014-08-11 LAB — BASIC METABOLIC PANEL
Anion gap: 4 — ABNORMAL LOW (ref 5–15)
BUN: 11 mg/dL (ref 6–20)
CO2: 28 mmol/L (ref 22–32)
Calcium: 8 mg/dL — ABNORMAL LOW (ref 8.9–10.3)
Chloride: 104 mmol/L (ref 101–111)
Creatinine, Ser: 0.56 mg/dL — ABNORMAL LOW (ref 0.61–1.24)
GFR calc Af Amer: >60 mL/min (ref >60)
Glucose, Bld: 119 mg/dL — ABNORMAL HIGH (ref 65–99)
Potassium: 4 mmol/L (ref 3.5–5.1)
SODIUM: 136 mmol/L (ref 135–145)

## 2014-08-11 MED ORDER — ACETAMINOPHEN 650 MG RE SUPP: 650.0000 mg | Freq: Four times a day (QID) | RECTAL | Status: DC | PRN

## 2014-08-11 MED ORDER — ACETAMINOPHEN 160 MG/5ML PO SOLN: 650.0000 mg | Freq: Four times a day (QID) | ORAL | Status: DC | PRN

## 2014-08-11 MED ORDER — HYDROMORPHONE HCL 1 MG/ML IJ SOLN
1.0000 mg | INTRAMUSCULAR | Status: DC | PRN
  Administered 2014-08-12: 1 mg via INTRAVENOUS
  Filled 2014-08-11: qty 1

## 2014-08-11 MED ORDER — HYDROMORPHONE HCL 1 MG/ML PO LIQD
5.0000 mg | ORAL | Status: DC | PRN
  Administered 2014-08-11 – 2014-08-12 (×5): 5 mg
  Filled 2014-08-11 (×5): qty 5

## 2014-08-11 MED ORDER — DILTIAZEM HCL 30 MG PO TABS: 30.0000 mg | ORAL_TABLET | Freq: Four times a day (QID) | ORAL | Status: DC

## 2014-08-11 MED ORDER — HYDROMORPHONE HCL 1 MG/ML IJ SOLN
1.0000 mg | Freq: Once | INTRAMUSCULAR | Status: AC
  Administered 2014-08-11: 1 mg via INTRAVENOUS
  Filled 2014-08-11: qty 1

## 2014-08-11 MED ORDER — DILTIAZEM 12 MG/ML ORAL SUSPENSION
30.0000 mg | Freq: Four times a day (QID) | ORAL | Status: DC
  Administered 2014-08-11 – 2014-08-12 (×4): 30 mg
  Filled 2014-08-11 (×9): qty 3

## 2014-08-11 NOTE — Progress Notes (Signed)
[facility]  Patient Status: Active (receiving services up to time of hospitalization)  [facility] is providing the following services: RN  If patient discharges after hours, please call [phone].   Steve [name] 08/11/2014, 9:50 AM

## 2014-08-11 NOTE — Care Management Note (Signed)
Case Management Note  Patient Details  Name: CURRY SEEFELDT MRN: [id number] Date of Birth: December 18, 1940  Subjective/Objective:  A fib, COPD, Diabetes, Non small lung cell cancer                  Action/Plan:  Home with [facility]   Expected Discharge Date:  08/11/14               Expected Discharge Plan:  Home w Hospice Care  In-House Referral:  NA  Discharge planning Services  CM Consult  Post Acute Care Choice:  NA Choice offered to:  NA  DME Arranged:  N/A DME Agency:  NA  HH Arranged:  NA HH Agency:  NA  Status of Service:  In process, will continue to follow  Medicare Important Message Given:  Yes-third notification given Date Medicare IM Given:    Medicare IM give by:    Date Additional Medicare IM Given:    Additional Medicare Important Message give by:     If discussed at [place] of Stay Meetings, dates discussed:    Additional CommentsPurcell Mouton, RN 08/11/2014, 12:01 PM

## 2014-08-11 NOTE — Progress Notes (Signed)
[facility] PROGRESS NOTE   Assessment/Plan: Pneumoperitoneum/leukytosis: - CT abdomen pelvis on admission showed 2 air bubbles noted tracking along the PEG tube. G-tube was placed due to a 5.5 cm subcarinal mass impinging on the esophagus. - IR was consulted they agreed pneumopericardium most likely due to PEG tube. Plan for fluoroscopic study on 08/09/2014 to evaluate function a G-tube. - Continue Zosyn per surgery recommendation, de-escalate treatment to Unasyn.  Metastatic non-small cell cancer: Systemic chemotherapy as an outpatient. Use liq dilaudid and IV PRN  A. fib with RVR/ PAF- on Flecainide, : CHADS2 vasc score at least 3 Now on SR, cards consulted recommended to restart flecainide.  Dysphagia: Extrinsic compression of the esophagus.  Anemia due to chemotherapy/  Primary cancer of right lower lobe of lung: Hemoglobin seems to be stable.  Severe protein caloric malnutrition: As a consequence of chronic disease, continue ensure.  Protein-calorie malnutrition, severe   Code Status: DNR/DNI Family Communication: plan of care discussed with the patient Disposition Plan: Plan for fluoroscopic study on Monday to evaluate the function / placement of G-tube   Consultants:  IR  surgery  Procedures: Ct Chest W Contrast 08/05/2014 1. Large volume free intraperitoneal air. No focal abnormality is evident to identify a perforated viscus. 2. There is subcutaneous air in the left upper quadrant and in the lower left chest. There also are 2 air bubbles tracking along the percutaneous gastrostomy tube within the anterior abdominal wall. This suggests the possibility of the percutaneous gastrostomy being the etiology of the free air, but it is not conclusive. The gastrostomy device itself appears to be satisfactorily positioned. 3. In the chest, the left mainstem bronchial stent appears to be satisfactorily positioned. There is new consolidation of left lower lobe basal  segments. There is worsened esophageal dilatation with air-fluid level, probably due to compromise of the esophagus by the sub- carinal mass. There is slight interval enlargement of a moderate right pleural effusion. Electronically Signed By: Andreas Newport M.D. On: 08/05/2014 21:11   Ct Abdomen Pelvis W Contrast 08/05/2014 1. Large volume free intraperitoneal air. No focal abnormality is evident to identify a perforated viscus. 2. There is subcutaneous air in the left upper quadrant and in the lower left chest. There also are 2 air bubbles tracking along the percutaneous gastrostomy tube within the anterior abdominal wall. This suggests the possibility of the percutaneous gastrostomy being the etiology of the free air, but it is not conclusive. The gastrostomy device itself appears to be satisfactorily positioned. 3. In the chest, the left mainstem bronchial stent appears to be satisfactorily positioned. There is new consolidation of left lower lobe basal segments. There is worsened esophageal dilatation with air-fluid level, probably due to compromise of the esophagus by the sub- carinal mass. There is slight interval enlargement of a moderate right pleural effusion. Electronically Signed By: Andreas Newport M.D. On: 08/05/2014 21:11   Dg Abd Acute W/chest 08/05/2014 Free air. Further evaluation with CT of abdomen pelvis is recommended.   Antibiotics:  Zosyn since admission.  HPI/Subjective: Now in pain.  Objective: Filed Vitals:   08/10/14 1904 08/10/14 1915 08/10/14 2033 08/11/14 0452  BP: 108/72  110/70 147/77  Pulse: 103  109 120  Temp: 98.2 F (36.8 C)  97.8 F (36.6 C) 97.9 F (36.6 C)  TempSrc: Oral  Oral Oral  Resp: '20  18 20  '$ Height:      Weight:    72 kg (158 lb 11.7 oz)  SpO2: 93% 95% 99%  93%    Intake/Output Summary (Last 24 hours) at 08/11/14 1243 Last data filed at 08/11/14 0819  Gross per 24 hour  Intake    624 ml  Output   1000 ml  Net   -376 ml     Filed Weights   08/09/14 1500 08/10/14 0439 08/11/14 0452  Weight: 71.94 kg (158 lb 9.6 oz) 72.1 kg (158 lb 15.2 oz) 72 kg (158 lb 11.7 oz)    Exam:  General: Alert, awake, oriented x3, in no acute distress.  HEENT: No bruits, no goiter.  Heart: Regular rate and rhythm. Lungs: Good air movement, clear Abdomen: Soft, nontender, nondistended, positive bowel sounds.  Neuro: Grossly intact, nonfocal.   Data Reviewed: Basic Metabolic Panel:  Recent Labs Lab 08/05/14 1825 08/06/14 0430 08/10/14 1110 08/11/14 0340  NA 133* 134* 136 136  K 3.6 3.5 3.2* 4.0  CL 94* 98* 101 104  CO2 '29 29 29 28  '$ GLUCOSE 168* 153* 133* 119*  BUN '10 11 11 11  '$ CREATININE 0.57* 0.56* 0.67 0.56*  CALCIUM 9.5 8.8* 8.5* 8.0*  MG  --   --  2.0  --    Liver Function Tests:  Recent Labs Lab 08/05/14 1825 08/06/14 0430  AST 24 20  ALT 38 30  ALKPHOS 97 85  BILITOT 0.4 0.3  PROT 6.7 6.0*  ALBUMIN 2.5* 2.2*   No results for input(s): LIPASE, AMYLASE in the last 168 hours. No results for input(s): AMMONIA in the last 168 hours. CBC:  Recent Labs Lab 08/05/14 1825 08/06/14 0430  WBC 12.1* 11.4*  NEUTROABS 11.0*  --   HGB 10.4* 9.6*  HCT 32.9* 30.2*  MCV 90.4 90.7  PLT 613* 579*   Cardiac Enzymes: No results for input(s): CKTOTAL, CKMB, CKMBINDEX, TROPONINI in the last 168 hours. BNP (last 3 results)  Recent Labs  07/14/14 1916  BNP 333.5*    ProBNP (last 3 results) No results for input(s): PROBNP in the last 8760 hours.  CBG:  Recent Labs Lab 08/10/14 2030 08/11/14 0029 08/11/14 0451 08/11/14 0758 08/11/14 1148  GLUCAP 130* 102* 113* 145* 111*    Recent Results (from the past 240 hour(s))  MRSA PCR Screening     Status: None   Collection Time: 08/06/14  2:34 AM  Result Value Ref Range Status   MRSA by PCR NEGATIVE NEGATIVE Final    Comment:        The GeneXpert MRSA Assay (FDA approved for NASAL specimens only), is one component of a comprehensive MRSA  colonization surveillance program. It is not intended to diagnose MRSA infection nor to guide or monitor treatment for MRSA infections.      Studies: No results found.  Scheduled Meds: . albuterol  3 mL Inhalation BID  . amoxicillin-clavulanate  875 mg Oral q12n4p  . antiseptic oral rinse  7 mL Mouth Rinse q12n4p  . chlorhexidine  15 mL Mouth Rinse BID  . diltiazem  30 mg Oral 4 times per day  . feeding supplement (OSMOLITE 1.5 CAL)  237 mL Per Tube QID  . feeding supplement (PRO-STAT SUGAR FREE 64)  30 mL Per Tube BID  . insulin aspart  0-15 Units Subcutaneous TID WC  . metoprolol  5 mg Intravenous 4 times per day  . pantoprazole sodium  40 mg Per Tube Daily   Continuous Infusions: . dextrose 5 % and 0.9% NaCl 75 mL/hr at 08/10/14 2333    Time Spent: 25 min   FELIZ Marguarite Arbour  [facility] Pager [phone]. If 7PM-7AM, please contact night-coverage at [email or web address], password [facility] 08/11/2014, 12:43 PM  LOS: 6 days

## 2014-08-11 NOTE — Progress Notes (Signed)
Consultation Note Date: 08/11/2014   Patient Name: Steve [name]  DOB: [birth year]  MRN: [id number]  Age / Sex: 74 y.o., male   PCP: Steve Dooms, MD Referring Physician: Charlynne Cousins, MD   Palliative Care Assessment and Plan Summary of Established Goals of Care and Medical Treatment Preferences   Clinical Assessment/Narrative:  Met with Steve [name] and his Steve [name] to discuss goals of care moving forward.  He reports that he met with Steve [name] yesterday to discuss new left lung mass noted on most recent CT scan. Following this discussion, he reports that his understanding is that he is likely to have little benefit from further treatment and his prognosis is less than 3 months if untreated. When asked about his goals with this understanding, he reports that the most important things to him are his family and being at home. He requested additional information on how to best focus on this goal.  With his permission, discussed hospice as being option that would allow him to focus on symptom management and remain in this home.  He reports this is what is most important to him, however he'll need to discuss with family.  Both he and his Steve [name] are in agreement that they would like to speak with case management regarding options for home hospice care.  Regarding symptom management, the patient reports he continues to have uncontrolled pain. He reports his pain was last controlled whenever he was receiving IV Dilaudid. Since he has been transitioned to by mouth pain medication, he reports pain in his and his right and left side that worsens with movement and deep breaths. He said is sharp in nature and does not radiate. He reports minimal relief in pain from pain medications.  - Referral placed to case management present options for home hospice care - Discussed pain regimen with primary physician. Recommend modified in order to ensure focus is on his comfort. Patient reports that being comfortable  is the most important thing to him at this time. - He is DNR/DNI - Admitting attending will discuss with cardiology a plan for controlling his A. fib in light of the fact that he is planning on pursuing hospice care.  Contacts/Participants in Discussion: Primary Decision Maker: Patient   HCPOA: None on chart  Code Status/Advance Care Planning:  DNR- Patient is interested in pursuing hospice care at home  Symptom Management:   Pain: Patient reports that he is not adequately controlled at this time. Discussed with primary attending and adjustments made in order to focus on his comfort. Recommend one-time IV rescue medication now followed by increasing his oral regimen.  SOB: Currently not an issue.  Discussed that he will likely need to continue to increase his opioid use at home under hospice direction as he becomes more symptomatic.  Psycho-social/Spiritual:   Support System: Steve [name], family  Desire for further Chaplaincy support:no  Prognosis: < 6 months  Discharge Planning:  Home with Hospice        Primary Diagnoses  Present on Admission:  . Primary cancer of right lower lobe of lung . PAF- on Flecainide,  . Erosive esophagitis on recent endoscopy . Atrial fibrillation with rapid ventricular response . Malnutrition of moderate degree . Pleural effusion, right . Dysphagia, pharyngoesophageal phase . Dyspnea . Wheezing . Pneumoperitoneum  Palliative Review of Systems: Positive for pain and SOB activity. I have reviewed the medical record, interviewed the patient and family, and examined the patient. The following aspects are pertinent.  Past Medical  History  Diagnosis Date  . Dizziness and giddiness     positional vertigo  . Hyperlipidemia   . Impotence of organic origin   . Osteoarthritis, shoulder   . Allergy   . Hypothyroidism     pt denies thryoid disease, dr pandey note 11-19-12 says subclinical hypothryoid in epic  . COPD (chronic obstructive pulmonary  disease)     pt denies  . Insomnia   . Hx of radiation therapy 02/2013    right lung  . Diabetes mellitus without complication     pt denies diabetes, dr pandey note says dm 11-19-12 epic  . 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 Malignant neoplasm of bladder, part unspecified   . Malignant neoplasm of bronchus and lung, unspecified site   . PAF (paroxysmal atrial fibrillation)     a. not on anticoag due to oncologic issues, on flecainide.  . Paroxysmal SVT (supraventricular tachycardia)   . Chronic respiratory failure   . Esophageal stricture   . Esophagitis   . Reactive airway disease   . Acute URI 07/29/2012  . Syncope 12/21/2013    Jan 2016 with documented orthostatic B/P and rapid PSVT on holter   . Ethmoid sinusitis 03/10/2014  . Orthostatic hypotension-on Midodrine 07/15/2014   History   Social History  . Marital Status: Married    Spouse Name: N/A  . Number of Children: 3  . Years of Education: N/A   Occupational History  . business owner    Social History Main Topics  . Smoking status: Former Smoker -- 1.50 packs/day for 50 years    Types: Cigarettes    Quit date: 01/09/2007  . Smokeless tobacco: Former Systems developer    Types: Chew    Quit date: 05/28/2014  . Alcohol Use: No  . Drug Use: No  . Sexual Activity: Not on file   Other Topics Concern  . None   Social History Narrative   Lives with Steve [name], who helps care for him.   Family History  Problem Relation Age of Onset  . Kidney disease Brother   . Hypertension Mother   . Diabetes Mother   . Cancer Father     type unknown  . Stroke Father    Scheduled Meds: . albuterol  3 mL Inhalation BID  . amoxicillin-clavulanate  875 mg Oral q12n4p  . antiseptic oral rinse  7 mL Mouth Rinse q12n4p  . chlorhexidine  15 mL Mouth Rinse BID  . diltiazem  30 mg Oral 4 times per day  . feeding supplement (OSMOLITE 1.5 CAL)  237 mL Per Tube QID  . feeding supplement (PRO-STAT SUGAR FREE 64)  30 mL Per Tube  BID  . insulin aspart  0-15 Units Subcutaneous TID WC  . metoprolol  5 mg Intravenous 4 times per day  . pantoprazole sodium  40 mg Per Tube Daily   Continuous Infusions: . dextrose 5 % and 0.9% NaCl 75 mL/hr at 08/10/14 2333   PRN Meds:.acetaminophen **OR** acetaminophen, free water, lidocaine, ondansetron **OR** ondansetron (ZOFRAN) IV, sodium chloride, temazepam Medications Prior to Admission:  Prior to Admission medications   Medication Sig Start Date End Date Taking? Authorizing Provider  albuterol (PROVENTIL) (5 MG/ML) 0.5% nebulizer solution Inhale 0.5 mLs into the lungs 2 (two) times daily. 08/05/14 11/03/14 Yes Historical Provider, MD  aspirin 81 MG tablet Place 1 tablet (81 mg total) into feeding tube daily. 07/29/14  Yes Velvet Bathe, MD  diltiazem (CARDIZEM) 90 MG tablet Take  1 tablet (90 mg total) by mouth every 6 (six) hours. 07/30/14  Yes Fay Records, MD  flecainide (TAMBOCOR) 100 MG tablet Place 1 tablet (100 mg total) into feeding tube 2 (two) times daily. 07/30/14  Yes Velvet Bathe, MD  guaiFENesin (MUCINEX) 600 MG 12 hr tablet Take 600 mg by mouth every 12 (twelve) hours as needed for cough.   Yes Historical Provider, MD  lactose free nutrition (BOOST PLUS) LIQD Take 237 mLs by mouth 2 (two) times daily between meals. 07/29/14  Yes Velvet Bathe, MD  lidocaine (XYLOCAINE) 2 % solution Use as directed 20 mLs in the mouth or throat every 6 (six) hours as needed for mouth pain. 06/22/14  Yes Maryanna Shape, NP  Nutritional Supplements (FEEDING SUPPLEMENT, OSMOLITE 1.5 CAL,) LIQD Place 237 mLs into feeding tube 4 (four) times daily. 07/29/14  Yes Velvet Bathe, MD  omeprazole (PRILOSEC) 40 MG capsule Take 40 mg by mouth 2 (two) times daily.  07/22/14  Yes Historical Provider, MD  oxyCODONE (ROXICODONE) 5 MG/5ML solution Take 5 mLs (5 mg total) by mouth every 4 (four) hours as needed for moderate pain. 07/29/14  Yes Velvet Bathe, MD  polyethylene glycol (MIRALAX / GLYCOLAX) packet Take 17  g by mouth daily as needed for mild constipation or moderate constipation.    Yes Historical Provider, MD  simvastatin (ZOCOR) 20 MG tablet Place 1 tablet (20 mg total) into feeding tube daily. Take one tablet by mouth once daily to lower cholesterol 07/29/14  Yes Velvet Bathe, MD  sodium chloride HYPERTONIC 3 % nebulizer solution Inhale 3 mLs into the lungs 2 (two) times daily. 08/05/14 11/03/14 Yes Historical Provider, MD  temazepam (RESTORIL) 30 MG capsule Place 1 capsule (30 mg total) into feeding tube at bedtime as needed for sleep. 07/29/14  Yes Velvet Bathe, MD  Water For Irrigation, Sterile (FREE WATER) SOLN Place 120 mLs into feeding tube 4 (four) times daily. 07/29/14  Yes Velvet Bathe, MD  Amino Acids-Protein Hydrolys (FEEDING SUPPLEMENT, PRO-STAT SUGAR FREE 64,) LIQD Place 30 mLs into feeding tube 2 (two) times daily. Patient not taking: Reported on 08/05/2014 07/29/14   Velvet Bathe, MD  pantoprazole sodium (PROTONIX) 40 mg/20 mL PACK Place 40 mLs (80 mg total) into feeding tube daily. Patient not taking: Reported on 08/05/2014 07/17/14   Hosie Poisson, MD   Allergies  Allergen Reactions  . Morphine Hives    Tolerated dilaudid on multiple occasions.  Takes OxyContin at home  . Morphine And Related Hives    Tolerated dilaudid on multiple occasions.  Takes OxyContin at home   CBC:    Component Value Date/Time   WBC 11.4* 08/06/2014 0430   WBC 15.9* 07/06/2014 1325   WBC 12.7* 05/10/2014 1707   HGB 9.6* 08/06/2014 0430   HGB 12.5* 07/06/2014 1325   HCT 30.2* 08/06/2014 0430   HCT 38.6 07/06/2014 1325   HCT 36.5* 05/10/2014 1707   PLT 579* 08/06/2014 0430   PLT 552* 07/06/2014 1325   MCV 90.7 08/06/2014 0430   MCV 93.7 07/06/2014 1325   NEUTROABS 11.0* 08/05/2014 1825   NEUTROABS 13.6* 07/06/2014 1325   NEUTROABS 9.9* 05/10/2014 1707   LYMPHSABS 0.9 08/05/2014 1825   LYMPHSABS 0.8* 07/06/2014 1325   LYMPHSABS 1.0 05/10/2014 1707   MONOABS 0.2 08/05/2014 1825   MONOABS 1.3*  07/06/2014 1325   EOSABS 0.0 08/05/2014 1825   EOSABS 0.1 07/06/2014 1325   BASOSABS 0.0 08/05/2014 1825   BASOSABS 0.1 07/06/2014 1325   BASOSABS  0.0 05/10/2014 1707   Comprehensive Metabolic Panel:    Component Value Date/Time   NA 136 08/11/2014 0340   NA 137 07/06/2014 1325   NA 142 04/28/2013 0819   K 4.0 08/11/2014 0340   K 4.3 07/06/2014 1325   CL 104 08/11/2014 0340   CO2 28 08/11/2014 0340   CO2 25 07/06/2014 1325   BUN 11 08/11/2014 0340   BUN 18.4 07/06/2014 1325   BUN 12 04/28/2013 0819   CREATININE 0.56* 08/11/2014 0340   CREATININE 0.8 07/06/2014 1325   GLUCOSE 119* 08/11/2014 0340   GLUCOSE 185* 07/06/2014 1325   GLUCOSE 103* 04/28/2013 0819   CALCIUM 8.0* 08/11/2014 0340   CALCIUM 9.2 07/06/2014 1325   AST 20 08/06/2014 0430   AST 15 07/06/2014 1325   ALT 30 08/06/2014 0430   ALT 13 07/06/2014 1325   ALKPHOS 85 08/06/2014 0430   ALKPHOS 86 07/06/2014 1325   BILITOT 0.3 08/06/2014 0430   BILITOT 0.50 07/06/2014 1325   PROT 6.0* 08/06/2014 0430   PROT 7.2 07/06/2014 1325   PROT 6.8 04/28/2013 0819   ALBUMIN 2.2* 08/06/2014 0430   ALBUMIN 2.9* 07/06/2014 1325    Physical Exam: Vital Signs: BP 147/77 mmHg  Pulse 120  Temp(Src) 97.9 F (36.6 C) (Oral)  Resp 20  Ht $R'5\' 11"'lp$  (1.803 m)  Wt 72 kg (158 lb 11.7 oz)  BMI 22.15 kg/m2  SpO2 93% SpO2: SpO2: 93 % O2 Device: O2 Device: Not Delivered O2 Flow Rate: O2 Flow Rate (L/min): 1 L/min Intake/output summary:  Intake/Output Summary (Last 24 hours) at 08/11/14 1228 Last data filed at 08/11/14 0819  Gross per 24 hour  Intake    624 ml  Output   1000 ml  Net   -376 ml   LBM: Last BM Date: 08/10/14 Baseline Weight: Weight: 74.8 kg (164 lb 14.5 oz) Most recent weight: Weight: 72 kg (158 lb 11.7 oz)  Exam Findings:  General: Elderly male lying in bed in NAD ENT: MMM, No scleral icterus Resp: Mild increase WOB, Scattered rhochi CV: Regular, no murmur Abd: Soft, NTTP Ext: No edema            Palliative Performance Scale: 40%              Additional Data Reviewed: Recent Labs     08/10/14  1110  08/11/14  0340  NA  136  136  BUN  11  11  CREATININE  0.67  0.56*     Time In: 0830 Time Out: 0945 Time Total: 80 Greater than 50%  of this time was spent counseling and coordinating care related to the above assessment and plan.  Signed by: Micheline Rough, MD  Micheline Rough, MD  08/11/2014, 12:28 PM  Please contact Palliative Medicine Team phone at [phone] for questions and concerns.

## 2014-08-11 NOTE — Progress Notes (Signed)
SUBJECTIVE: denies any palpitations  OBJECTIVE:   Vitals:   Filed Vitals:   08/10/14 1904 08/10/14 1915 08/10/14 2033 08/11/14 0452  BP: 108/72  110/70 147/77  Pulse: 103  109 120  Temp: 98.2 F (36.8 C)  97.8 F (36.6 C) 97.9 F (36.6 C)  TempSrc: Oral  Oral Oral  Resp: '20  18 20  '$ Height:      Weight:    158 lb 11.7 oz (72 kg)  SpO2: 93% 95% 99% 93%   I&O's:   Intake/Output Summary (Last 24 hours) at 08/11/14 1050 Last data filed at 08/11/14 0819  Gross per 24 hour  Intake    634 ml  Output   1200 ml  Net   -566 ml   TELEMETRY: Reviewed telemetry pt in NSR with burst of PAF:     PHYSICAL EXAM General: Well developed, well nourished, in no acute distress Head: Eyes PERRLA, No xanthomas.   Normal cephalic and atramatic  Lungs:   Clear bilaterally to auscultation and percussion. Heart:   HRRR S1 S2 Pulses are 2+ & equal. Abdomen: Bowel sounds are positive, abdomen soft and non-tender without masses  Extremities:   No clubbing, cyanosis or edema.  DP +1 Neuro: Alert and oriented X 3. Psych:  Good affect, responds appropriately   LABS: Basic Metabolic Panel:  Recent Labs  08/10/14 1110 08/11/14 0340  NA 136 136  K 3.2* 4.0  CL 101 104  CO2 29 28  GLUCOSE 133* 119*  BUN 11 11  CREATININE 0.67 0.56*  CALCIUM 8.5* 8.0*  MG 2.0  --    Liver Function Tests: No results for input(s): AST, ALT, ALKPHOS, BILITOT, PROT, ALBUMIN in the last 72 hours. No results for input(s): LIPASE, AMYLASE in the last 72 hours. CBC: No results for input(s): WBC, NEUTROABS, HGB, HCT, MCV, PLT in the last 72 hours. Cardiac Enzymes: No results for input(s): CKTOTAL, CKMB, CKMBINDEX, TROPONINI in the last 72 hours. BNP: Invalid input(s): POCBNP D-Dimer: No results for input(s): DDIMER in the last 72 hours. Hemoglobin A1C: No results for input(s): HGBA1C in the last 72 hours. Fasting Lipid Panel: No results for input(s): CHOL, HDL, LDLCALC, TRIG, CHOLHDL, LDLDIRECT in the  last 72 hours. Thyroid Function Tests: No results for input(s): TSH, T4TOTAL, T3FREE, THYROIDAB in the last 72 hours.  Invalid input(s): FREET3 Anemia Panel: No results for input(s): VITAMINB12, FOLATE, FERRITIN, TIBC, IRON, RETICCTPCT in the last 72 hours. Coag Panel:   Lab Results  Component Value Date   INR CANCELED 01/28/2014   INR 1.10 11/16/2013    RADIOLOGY: Dg Chest 2 View  07/24/2014   CLINICAL DATA:  Shortness of breath and difficulty swallowing.  EXAM: CHEST  2 VIEW  COMPARISON:  07/14/2014  FINDINGS: Right chest wall port a catheter is noted with tip in the cavoatrial junction. The heart size appears normal. Right pleural effusion is again identified. No left effusion. Right lower lobe lung mass is stable from previous exam.  IMPRESSION: 1. No acute findings and no significant change from 07/14/2014   Electronically Signed   By: Kerby Moors M.D.   On: 07/24/2014 16:01   Ct Chest W Contrast  08/05/2014   CLINICAL DATA:  Left mainstem bronchus stent placed this morning. Was found to have free intraperitoneal air and was sent to the emergency room.  EXAM: CT CHEST, ABDOMEN, AND PELVIS WITH CONTRAST  TECHNIQUE: Multidetector CT imaging of the chest, abdomen and pelvis was performed following the standard protocol during  bolus administration of intravenous contrast.  CONTRAST:  4m OMNIPAQUE IOHEXOL 300 MG/ML SOLN, 1053mOMNIPAQUE IOHEXOL 300 MG/ML SOLN  COMPARISON:  07/14/2014  FINDINGS: CT CHEST FINDINGS  The left mainstem bronchus stent appears well-positioned. Endo stent lumen is widely patent. There is no significant change in the peripherally enhancing centrally hypodense 5.5 cm sub- carinal mass. There is no significant change in the central right lower lobe peripherally enhancing 4.7 x 7.2 cm mass. There is a moderate right pleural effusion which is marginally larger than on 07/14/2014. There is consolidation and air bronchograms in the posterior basal segment of the left lower  lobe, new, and there is a lesser degree of consolidation and volume loss in the lateral basal segment. There is a small pericardial effusion, unchanged. There is mild esophageal dilatation with an air-fluid level and this appears a little worsened. It may be due to compromise to the esophagus by the subcarinal mass.  There is no pneumothorax or pneumomediastinum.  CT ABDOMEN AND PELVIS FINDINGS  There is a large volume free intraperitoneal air. Most of the air is collected anteriorly over the liver and around the stomach. There also is a small volume of subcutaneous air in the left upper quadrant, above and below the percutaneous gastrostomy but not directly around it. On sagittal image 78, series 7 there are 2 air bubbles tracking alongside the percutaneous gastrostomy tube in the abdominal wall musculature.  No focal inflammatory changes are evident in the abdomen or pelvis. No focally inflamed or irregular bowel is evident to identified location of a perforated viscus. There are unremarkable appearances of the colon. The stomach is remarkable only for presence of the percutaneous gastrostomy. There is good contrast distention of the stomach and duodenum with no evidence of contrast extravasation. Small bowel is unremarkable.  There are normal appearances of the liver, gallbladder, bile ducts, pancreas, spleen, adrenals and kidneys.  The abdominal aorta is normal in caliber with moderate atherosclerotic calcification.  No significant skeletal lesions are evident.  IMPRESSION: 1. Large volume free intraperitoneal air. No focal abnormality is evident to identify a perforated viscus. 2. There is subcutaneous air in the left upper quadrant and in the lower left chest. There also are 2 air bubbles tracking along the percutaneous gastrostomy tube within the anterior abdominal wall. This suggests the possibility of the percutaneous gastrostomy being the etiology of the free air, but it is not conclusive. The gastrostomy  device itself appears to be satisfactorily positioned. 3. In the chest, the left mainstem bronchial stent appears to be satisfactorily positioned. There is new consolidation of left lower lobe basal segments. There is worsened esophageal dilatation with air-fluid level, probably due to compromise of the esophagus by the sub- carinal mass. There is slight interval enlargement of a moderate right pleural effusion.   Electronically Signed   By: DaAndreas Newport.D.   On: 08/05/2014 21:11   Ct Angio Chest Pe W/cm &/or Wo Cm  07/14/2014   CLINICAL DATA:  [age]ear old male with history of lung cancer and COPD complaining of shortness of breath.  EXAM: CT ANGIOGRAPHY CHEST WITH CONTRAST  TECHNIQUE: Multidetector CT imaging of the chest was performed using the standard protocol during bolus administration of intravenous contrast. Multiplanar CT image reconstructions and MIPs were obtained to evaluate the vascular anatomy.  CONTRAST:  10078mMNIPAQUE IOHEXOL 350 MG/ML SOLN  COMPARISON:  Chest radiograph dated 07/14/2014 and CT dated 05/14/2014.  FINDINGS: Mild emphysematous changes. There is stable appearing 7.0 x 4.5 cm right  lower lobe mass with encasement of the right middle and right lower lobe bronchi and occlusion of the right lower lobe bronchus. There is associated postobstructive atelectasis versus pneumonia. Stable moderate right pleural effusion again noted. There is apparent extension of the mass into the right hilar and subcarinal region.  Mild atherosclerotic calcification of the aorta. No CT evidence of pulmonary embolism. There is encasement and complete occlusion of the right lower lobe pulmonary artery branch by the lung mass. No cardiomegaly. There is coronary vascular calcification. Small pericardial effusion.  Right pectoral Port-A-Cath with tip in central SVC. Partially visualized subcentimeter left hepatic hypodense lesion, incompletely characterized. Degenerative changes of the spine. No acute  fracture.  Review of the MIP images confirms the above findings.  IMPRESSION: No CT evidence of pulmonary embolism.  Stable appearing right lower lobe mass with encasement of the right lower lobe pulmonary arteries branch as well as right lower lobe bronchus. There is postobstructive atelectasis/ pneumonia.  Grossly stable right pleural effusion.  Scratch   Electronically Signed   By: Anner Crete M.D.   On: 07/14/2014 22:45   Ct Abdomen Pelvis W Contrast  08/05/2014   CLINICAL DATA:  Left mainstem bronchus stent placed this morning. Was found to have free intraperitoneal air and was sent to the emergency room.  EXAM: CT CHEST, ABDOMEN, AND PELVIS WITH CONTRAST  TECHNIQUE: Multidetector CT imaging of the chest, abdomen and pelvis was performed following the standard protocol during bolus administration of intravenous contrast.  CONTRAST:  27m OMNIPAQUE IOHEXOL 300 MG/ML SOLN, 1060mOMNIPAQUE IOHEXOL 300 MG/ML SOLN  COMPARISON:  07/14/2014  FINDINGS: CT CHEST FINDINGS  The left mainstem bronchus stent appears well-positioned. Endo stent lumen is widely patent. There is no significant change in the peripherally enhancing centrally hypodense 5.5 cm sub- carinal mass. There is no significant change in the central right lower lobe peripherally enhancing 4.7 x 7.2 cm mass. There is a moderate right pleural effusion which is marginally larger than on 07/14/2014. There is consolidation and air bronchograms in the posterior basal segment of the left lower lobe, new, and there is a lesser degree of consolidation and volume loss in the lateral basal segment. There is a small pericardial effusion, unchanged. There is mild esophageal dilatation with an air-fluid level and this appears a little worsened. It may be due to compromise to the esophagus by the subcarinal mass.  There is no pneumothorax or pneumomediastinum.  CT ABDOMEN AND PELVIS FINDINGS  There is a large volume free intraperitoneal air. Most of the air is  collected anteriorly over the liver and around the stomach. There also is a small volume of subcutaneous air in the left upper quadrant, above and below the percutaneous gastrostomy but not directly around it. On sagittal image 78, series 7 there are 2 air bubbles tracking alongside the percutaneous gastrostomy tube in the abdominal wall musculature.  No focal inflammatory changes are evident in the abdomen or pelvis. No focally inflamed or irregular bowel is evident to identified location of a perforated viscus. There are unremarkable appearances of the colon. The stomach is remarkable only for presence of the percutaneous gastrostomy. There is good contrast distention of the stomach and duodenum with no evidence of contrast extravasation. Small bowel is unremarkable.  There are normal appearances of the liver, gallbladder, bile ducts, pancreas, spleen, adrenals and kidneys.  The abdominal aorta is normal in caliber with moderate atherosclerotic calcification.  No significant skeletal lesions are evident.  IMPRESSION: 1. Large volume free  intraperitoneal air. No focal abnormality is evident to identify a perforated viscus. 2. There is subcutaneous air in the left upper quadrant and in the lower left chest. There also are 2 air bubbles tracking along the percutaneous gastrostomy tube within the anterior abdominal wall. This suggests the possibility of the percutaneous gastrostomy being the etiology of the free air, but it is not conclusive. The gastrostomy device itself appears to be satisfactorily positioned. 3. In the chest, the left mainstem bronchial stent appears to be satisfactorily positioned. There is new consolidation of left lower lobe basal segments. There is worsened esophageal dilatation with air-fluid level, probably due to compromise of the esophagus by the sub- carinal mass. There is slight interval enlargement of a moderate right pleural effusion.   Electronically Signed   By: Andreas Newport M.D.    On: 08/05/2014 21:11   Ir Gastrostomy Tube Mod Sed  07/27/2014   CLINICAL DATA:  74 year old male with a history of dysphagia/strictures. He has been referred for percutaneous gastrostomy placement.  EXAM: PERCUTANEOUS GASTROSTOMY  FLUOROSCOPY TIME:  2 minutes 30 seconds minutes  MEDICATIONS AND MEDICAL HISTORY: Versed 2.5 mg, Fentanyl 125 mcg.  2 g Ancef  ANESTHESIA/SEDATION: Moderate sedation time: 27 minutes  CONTRAST:  25 cc Omni 300 enteric  PROCEDURE: The procedure, risks, benefits, and alternatives were explained to the patient. Questions regarding the procedure were encouraged and answered. The patient understands and consents to the procedure.  The epigastrium was prepped with Betadine in a sterile fashion, and a sterile drape was applied covering the operative field. A sterile gown and sterile gloves were used for the procedure.  A 5-French orogastric tube is placed under fluoroscopic guidance. Scout imaging of the abdomen confirms barium within the transverse colon.  The stomach was distended with gas. Under fluoroscopic guidance, an 18 gauge needle was utilized to puncture the anterior wall of the body of the stomach. An Amplatz wire was advanced through the needle passing a T fastener into the lumen of the stomach. The T fastener was secured for gastropexy. A second T fastener was deployed using an 18 gauge needle adjacent to the first deployment.  Serial dilation was performed over the Amplatz wire at the location of the first puncture, with placement of a 20 French peel-away sheath. An 18 gauge balloon retention gastrostomy tube was then placed through the peel-away sheath over the wire. Once we confirmed position, the wire and peel-away sheath were removed. Contrast infused through the tube confirmed position and AP and lateral position.  The patient tolerated the procedure well and remained hemodynamically stable throughout.  No complications were encountered and no significant blood loss was  encountered.  COMPLICATIONS: None  FINDINGS: The image demonstrates placement of a 20-French balloon retention gastrostomy tube into the body of the stomach.  IMPRESSION: Status post placement of 20 French balloon retention gastrostomy.  Signed,  Dulcy Fanny. Earleen Newport DO  [facility]  [facility]  PLAN: Decompression for 24 hours on low wall suction.  The tube may be used starting after 24 hours.   Electronically Signed   By: Corrie Mckusick D.O.   On: 07/27/2014 17:49   Ir Cm Inj Any Colonic Tube W/fluoro  08/09/2014   CLINICAL DATA:  BALLOON RETENTION GASTROSTOMY, PNEUMOPERITONEUM BY RECENT CT 08/05/2014. Assess for gastrostomy position and leakage.  EXAM: GI TUBE INJECTION  COMPARISON:  08/05/2014 CT  FINDINGS: Under fluoroscopy, the existing balloon retention gastrostomy was injected with contrast. This confirms position within the  stomach. Stomach is decompressed. No evidence of leakage or extravasation. No obstruction.  IMPRESSION: Gastrostomy within the stomach.  No complicating feature.   Electronically Signed   By: Jerilynn Mages.  Shick M.D.   On: 08/09/2014 14:56   Dg Chest Port 1 View  07/14/2014   CLINICAL DATA:  Worsening shortness of breath and chest pain. Currently on chemotherapy for right lower lobe lung carcinoma.  EXAM: PORTABLE CHEST - 1 VIEW  COMPARISON:  06/10/2014  FINDINGS: Right-sided power port remains in appropriate position. Right infrahilar mass like opacity shows no significant change allowing for differences in positioning. Pleural- parenchymal scarring at right lung base appears stable. No new areas of pulmonary opacity are identified. No evidence of pneumothorax or pleural effusion. Heart size is within normal limits.  IMPRESSION: No significant change in right infrahilar masslike opacity and right basilar scarring.   Electronically Signed   By: Earle Gell M.D.   On: 07/14/2014 19:33   Dg Abd Acute W/chest  08/05/2014   CLINICAL DATA:  Air in the  abdomen. Patient had a stent placed in the left lung this morning. History of lung cancer.  EXAM: DG ABDOMEN ACUTE W/ 1V CHEST  COMPARISON:  July 24, 2014  FINDINGS: There is a small right pleural effusion enlarged compared to the prior exam. Prominence of right hilum is identified unchanged. The left lung is clear. The heart size is normal. Right central venous line is unchanged.  There is free air. Focal dilated bowel loop is noted in the lower abdomen/pelvis. Radiopaque needle like densities are projected over the abdomen. A PEG tube is identified.  IMPRESSION: Free air. Further evaluation with CT of abdomen pelvis is recommended.  These results will be called to the ordering clinician or representative by the Radiologist Assistant, and communication documented in the PACS or zVision Dashboard.   Electronically Signed   By: Abelardo Diesel M.D.   On: 08/05/2014 18:16   ASSESSMENT AND PLAN:   PAF- on Flecainide, Atrial fibrillation with rapid ventricular response episodic. He feels that the flecainide has helped since going on it. - Feel episodes are related to acute illness and has been in and out of afib since admission but cannot tell when he is in it  - Did not tolerate IV Cardizem due to hypotension - will continue flecainide for now since he is in and out of PAF and hopefully will be more effective at maintaining NSR once acute illness improved but with ongoing lung issues this may become more frequent.Could try amio but with underlying lung disease would like to try to avoid this. - restart Cardizem PO '30mg'$  Q 6 hours and titrate as BP tolerates. - not on anticoagulation due to  Lung mass - Palliative Care consult in progress due to progression of lung mass and will go home with Hospice and no more Chemo.  Life expectancy 3-6 months. - F/U outpt with Dr. Harrington Challenger  Not on anticoagulation, continue ASA  Otherwise, per IM and consults. Principal Problem:  Pneumoperitoneum of unknown  etiology Active Problems:  Primary cancer of right lower lobe of lung  Wheezing  Pleural effusion, right  Dysphagia, pharyngoesophageal phase  Dyspnea  Malnutrition of moderate degree  Erosive esophagitis on recent endoscopy  Odynophagia  Esophageal stricture  Gastrostomy in place  Pneumoperitoneum  Protein-calorie malnutrition, severe  Encounter for palliative care     Sueanne Margarita, MD  08/11/2014  10:50 AM

## 2014-08-11 NOTE — Telephone Encounter (Signed)
Pt has been referred to Hospice from inpt . Julien Nordmann will be attending and [place] notified.

## 2014-08-11 NOTE — Progress Notes (Signed)
Notified by Dr.Freeman of family request for Hospice and [place] services at home after discharge. Chart and patient Information currently under review to confirm hospice eligibility.  Spoke with Haynes Dage, at bedside to initiate education related to hospice philosophy, services and team approach to care. Family verbalized understanding of the information provided.   Per discussion plan is for discharge to home by personal vehicle with wife once pain is under control.  Please send signed, completed DNR form home with patient. Patient will need prescriptions for discharge comfort medications.  DME needs discussed and family requested tub seat and light weight wheelchair for delivery to the home today. [facility] equipment manager Jewel Ysidro Evert notified and will contact [place] to arrange delivery to the home. Patient's home address has been verified and is correct in the chart; Haynes Dage is family member to be contacted to arrange time of delivery.  Referral Center aware of the above. The discharge summary will need to be faxed to [facility] at [phone] when final. Please notify [facility] when patient is ready to leave unit at discharge-call [phone]. [facility] information and contact numbers have been given to wifeduring visit.  Please call with any questions. Annia Belt RN, [facility] Liaison [phone]

## 2014-08-11 NOTE — Progress Notes (Signed)
PT Cancellation Note  Patient Details Name: QUADRY KAMPA MRN: [id number] DOB: 06-10-1940   Cancelled Treatment:    Reason Eval/Treat Not Completed: Other (comment) (checked with RN, pt brought pain meds and then sleeping)   Johann Santone,KATHrine E 08/11/2014, 11:25 AM

## 2014-08-11 NOTE — Progress Notes (Signed)
Spoke with pt and wife at bedside concerning Home with Hospice.  Both selected [facility]. Referral called to [phone], [place].

## 2014-08-12 DIAGNOSIS — R0600 Dyspnea, unspecified: Secondary | ICD-10-CM

## 2014-08-12 DIAGNOSIS — I4891 Unspecified atrial fibrillation: Secondary | ICD-10-CM | POA: Insufficient documentation

## 2014-08-12 LAB — GLUCOSE, CAPILLARY
GLUCOSE-CAPILLARY: 122 mg/dL — AB (ref 65–99)
GLUCOSE-CAPILLARY: 147 mg/dL — AB (ref 65–99)
GLUCOSE-CAPILLARY: 162 mg/dL — AB (ref 65–99)
Glucose-Capillary: 91 mg/dL (ref 65–99)
Glucose-Capillary: 179 mg/dL — ABNORMAL HIGH (ref 65–99)
Glucose-Capillary: 120 mg/dL — ABNORMAL HIGH (ref 65–99)
Glucose-Capillary: 147 mg/dL — ABNORMAL HIGH (ref 65–99)

## 2014-08-12 LAB — BASIC METABOLIC PANEL
Anion gap: 7 (ref 5–15)
BUN: 8 mg/dL (ref 6–20)
CALCIUM: 8.4 mg/dL — AB (ref 8.9–10.3)
CO2: 26 mmol/L (ref 22–32)
Chloride: 104 mmol/L (ref 101–111)
Creatinine, Ser: 0.5 mg/dL — ABNORMAL LOW (ref 0.61–1.24)
GFR calc Af Amer: >60 mL/min (ref >60)
GFR calc non Af Amer: >60 mL/min (ref >60)
Glucose, Bld: 124 mg/dL — ABNORMAL HIGH (ref 65–99)
POTASSIUM: 3.8 mmol/L (ref 3.5–5.1)
SODIUM: 137 mmol/L (ref 135–145)

## 2014-08-12 MED ORDER — METOPROLOL TARTRATE 25 MG PO TABS
25.0000 mg | ORAL_TABLET | Freq: Two times a day (BID) | ORAL | Status: DC
  Administered 2014-08-12 – 2014-08-13 (×3): 25 mg via ORAL
  Filled 2014-08-12 (×3): qty 1

## 2014-08-12 MED ORDER — POLYETHYLENE GLYCOL 3350 17 G PO PACK
17.0000 g | PACK | Freq: Every day | ORAL | Status: DC
  Administered 2014-08-12 – 2014-08-13 (×2): 17 g via ORAL
  Filled 2014-08-12 (×2): qty 1

## 2014-08-12 MED ORDER — HYDROMORPHONE HCL 1 MG/ML PO LIQD: 5.0000 mg | ORAL | Status: DC

## 2014-08-12 MED ORDER — DILTIAZEM 12 MG/ML ORAL SUSPENSION
60.0000 mg | Freq: Four times a day (QID) | ORAL | Status: DC
  Administered 2014-08-12 – 2014-08-13 (×4): 60 mg
  Filled 2014-08-12 (×8): qty 6

## 2014-08-12 MED ORDER — HYDROMORPHONE HCL 1 MG/ML PO LIQD
5.0000 mg | ORAL | Status: DC
  Administered 2014-08-12 – 2014-08-13 (×8): 5 mg
  Filled 2014-08-12 (×8): qty 5

## 2014-08-12 NOTE — Progress Notes (Signed)
PT Cancellation Note  Patient Details Name: ARNOL MCGIBBON MRN: [id number] DOB: [birth year]   Cancelled Treatment:    Reason Eval/Treat Not Completed: Pain limiting ability to participate Pt declined PT today due to pain however requested to check back tomorrow.   Vinnie Bobst,KATHrine E 08/12/2014, 3:12 PM Carmelia Bake, PT, DPT 08/12/2014 Pager: [phone]

## 2014-08-12 NOTE — Progress Notes (Signed)
Daily Progress Note   Patient Name: Steve [name]       Date: 08/12/2014 DOB: Mar 10, 1940  Age: 74 y.o. MRN#: [id number] Attending Physician: Charlynne Cousins, MD Primary Care Physician: [name], MD Admit Date: 08/05/2014  Reason for Consultation/Follow-up: Establishing goals of care  Subjective:   Mr. Berti report that he slept well last evening.  He continues to have pain in his chest on the left side.  Sharp, constant, better with pain medication, worse with certain movements/deep breaths.  Currently 8-9/10.  He also endorses getting SOB very easily.  Reports that he has been having much better pain control since increasing dilaudid yesterday, but he reports that he will ask for medication when he hurts and will be in significant pain before medication is brought and kicks in.  Length of Stay: 7 days  Current Medications: Scheduled Meds:  . albuterol  3 mL Inhalation BID  . amoxicillin-clavulanate  875 mg Oral q12n4p  . antiseptic oral rinse  7 mL Mouth Rinse q12n4p  . chlorhexidine  15 mL Mouth Rinse BID  . diltiazem  30 mg Per Tube 4 times per day  . feeding supplement (OSMOLITE 1.5 CAL)  237 mL Per Tube QID  . feeding supplement (PRO-STAT SUGAR FREE 64)  30 mL Per Tube BID  . insulin aspart  0-15 Units Subcutaneous TID WC  . metoprolol  5 mg Intravenous 4 times per day  . pantoprazole sodium  40 mg Per Tube Daily    Continuous Infusions: . dextrose 5 % and 0.9% NaCl 75 mL/hr at 08/12/14 0143    PRN Meds: acetaminophen **OR** acetaminophen, free water, HYDROmorphone (DILAUDID) injection, HYDROmorphone HCl, lidocaine, ondansetron **OR** ondansetron (ZOFRAN) IV, sodium chloride, temazepam  Palliative Performance Scale: 50%     Vital Signs: BP 134/79 mmHg  Pulse 109  Temp(Src) 98.3 F (36.8 C) (Oral)  Resp 18  Ht '5\' 11"'$  (1.803 m)  Wt 72.1 kg (158 lb 15.2 oz)  BMI 22.18 kg/m2  SpO2 95% SpO2: SpO2: 95 % O2 Device: O2 Device: Not Delivered O2 Flow Rate:  O2 Flow Rate (L/min): 1 L/min  Intake/output summary:  Intake/Output Summary (Last 24 hours) at 08/12/14 0837 Last data filed at 08/12/14 0835  Gross per 24 hour  Intake 1693.75 ml  Output   1900 ml  Net -206.25 ml   LBM:   Baseline Weight: Weight: 74.8 kg (164 lb 14.5 oz) Most recent weight: Weight: 72.1 kg (158 lb 15.2 oz)  Physical Exam: General: Elderly male lying in bed in NAD ENT: MMM, No scleral icterus Resp: Mild increase WOB, Scattered rhochi CV: tachycardic, no murmur Abd: Soft, NTTP Ext: No edema            Additional Data Reviewed: Recent Labs     08/11/14  0340  08/12/14  0340  NA  136  137  BUN  11  8  CREATININE  0.56*  0.50*     Problem List:  Patient Active Problem List   Diagnosis Date Noted  . Palliative care by specialist   . Encounter for palliative care   . Pneumoperitoneum 08/06/2014  . Protein-calorie malnutrition, severe 08/06/2014  . Gastrostomy in place 08/05/2014  . Bronchial stenosis s/p stenting 08/05/2014 08/05/2014  . Pneumoperitoneum of unknown etiology 08/05/2014  . Esophageal stricture 07/26/2014  . Odynophagia 07/25/2014  . Atrial fibrillation with rapid ventricular response 07/24/2014  . HCAP (healthcare-associated pneumonia) 07/15/2014  . Malnutrition of moderate degree 07/15/2014  . Erosive esophagitis  on recent endoscopy 07/15/2014  . Dyspnea 07/14/2014  . Dysphagia, pharyngoesophageal phase 06/14/2014  . Malignant neoplasm of lower lobe of right lung 01/28/2014  . Pleural effusion, right 01/28/2014  . ILD (interstitial lung disease) 01/28/2014  . Wheezing 07/15/2013  . Insomnia 07/15/2013  . Neuropathic pain 04/29/2013  . PAF- on Flecainide,  04/29/2013  . Chronic pain disorder 04/29/2013  . DM type 2 with diabetic peripheral neuropathy 04/29/2013  . Primary cancer of right lower lobe of lung 12/25/2012  . Subclinical hypothyroidism 11/19/2012  . Need for prophylactic vaccination and inoculation against influenza  09/20/2012  . Reactive airway disease 08/19/2012  . Benign paroxysmal positional vertigo 07/29/2012  . Impotence of organic origin   . Osteoarthritis, shoulder      Palliative Care Assessment & Plan    Code Status:  DNR  Goals of Care:  Plan for discharge home with hospice support.  Patient is DNR.  Symptom Management:  Pain improved with increase in dilaudid.  Would continue same.  Discussed with patient strategy of asking for medication prior to pain becoming severe or asking for dose 30 minutes prior to activities that cause him pain (ie bathing).  He expressed understanding.  Palliative Prophylaxis:  Has been having regular stool and is on tube feeds.  However, he has been increasing his pain medication usage, and may need to increase his bowel regimen due to increase in narcotics.  Follow closely.  Psycho-social/Spiritual:  Desire for further Chaplaincy support:no   Prognosis: < 6 months Discharge Planning: Home with Hospice   Care plan was discussed with patient  Thank you for allowing the Palliative Medicine Team to assist in the care of this patient.   Time In: 0800 Time Out: 0820 Total Time 20 Prolonged Time Billed  no     Greater than 50%  of this time was spent counseling and coordinating care related to the above assessment and plan.   Micheline Rough, MD  08/12/2014, 8:37 AM  Please contact Palliative Medicine Team phone at [phone] for questions and concerns.

## 2014-08-12 NOTE — Progress Notes (Addendum)
Subjective: Pt and wife frustrated with episodic a fib discussed with illness a fib is more difficult to control.  Objective: Vital signs in last 24 hours: Temp:  [97.9 F (36.6 C)-98.4 F (36.9 C)] 98.3 F (36.8 C) (08/04 0430) Pulse Rate:  [107-109] 109 (08/04 0430) Resp:  [18] 18 (08/04 0430) BP: (105-134)/(66-79) 134/79 mmHg (08/04 0430) SpO2:  [93 %-98 %] 95 % (08/04 0748) Weight:  [158 lb 15.2 oz (72.1 kg)] 158 lb 15.2 oz (72.1 kg) (08/04 0430) Weight change: 3.5 oz (0.1 kg) Last BM Date: 08/10/14 Intake/Output from previous day: +43 08/03 0701 - 08/04 0700 In: 1693.8 [I.V.:1693.8] Out: 1650 [Urine:1650] Intake/Output this shift: Total I/O In: -  Out: 600 [Urine:600]  PE: General:Pleasant affect, but a little hazy after sleeping pill, NAD Skin:Warm and dry, brisk capillary refill HEENT:normocephalic, sclera clear, mucus membranes moist Heart:irreg irreg without murmur, gallup, rub or click Lungs:with rhonchi, occ wheezes LPF:XTKW, non tender, + BS, do not palpate liver spleen or masses Ext:no lower ext edema, 2+ pedal pulses, 2+ radial pulses Neuro:alert and oriented X 3, MAE, follows commands, + facial symmetry Tele: a fib with RVR at 150    Lab Results: No results for input(s): WBC, HGB, HCT, PLT in the last 72 hours. BMET  Recent Labs  08/11/14 0340 08/12/14 0340  NA 136 137  K 4.0 3.8  CL 104 104  CO2 28 26  GLUCOSE 119* 124*  BUN 11 8  CREATININE 0.56* 0.50*  CALCIUM 8.0* 8.4*   No results for input(s): TROPONINI in the last 72 hours.  Invalid input(s): CK, MB  Lab Results  Component Value Date   CHOL 180 04/28/2013   HDL 37* 04/28/2013   LDLCALC 110* 04/28/2013   TRIG 167* 04/28/2013   CHOLHDL 4.9 04/28/2013   Lab Results  Component Value Date   HGBA1C 5.9* 07/15/2014     Lab Results  Component Value Date   TSH 0.474 07/15/2014      Studies/Results: No results found.  Medications: I have reviewed the patient's  current medications. Scheduled Meds: . albuterol  3 mL Inhalation BID  . amoxicillin-clavulanate  875 mg Oral q12n4p  . antiseptic oral rinse  7 mL Mouth Rinse q12n4p  . chlorhexidine  15 mL Mouth Rinse BID  . diltiazem  30 mg Per Tube 4 times per day  . feeding supplement (OSMOLITE 1.5 CAL)  237 mL Per Tube QID  . feeding supplement (PRO-STAT SUGAR FREE 64)  30 mL Per Tube BID  . HYDROmorphone HCl  5 mg Per Tube Q3H  . insulin aspart  0-15 Units Subcutaneous TID WC  . metoprolol  5 mg Intravenous 4 times per day  . pantoprazole sodium  40 mg Per Tube Daily  . polyethylene glycol  17 g Oral Daily   Continuous Infusions: . dextrose 5 % and 0.9% NaCl 75 mL/hr at 08/12/14 0143   PRN Meds:.acetaminophen **OR** acetaminophen, free water, HYDROmorphone (DILAUDID) injection, lidocaine, ondansetron **OR** ondansetron (ZOFRAN) IV, sodium chloride, temazepam  Assessment/Plan: Principal Problem:   Pneumoperitoneum of unknown etiology Active Problems:   Primary cancer of right lower lobe of lung   PAF- on Flecainide,    Wheezing   Pleural effusion, right   Dysphagia, pharyngoesophageal phase   Dyspnea   Malnutrition of moderate degree   Erosive esophagitis on recent endoscopy   Atrial fibrillation with rapid ventricular response   Odynophagia   Esophageal stricture   Gastrostomy in place  Pneumoperitoneum   Protein-calorie malnutrition, severe   Encounter for palliative care   Palliative care by specialist   Atrial fibrillation with RVR  PAF- on Flecainide, Atrial fibrillation with rapid ventricular response episodic. He feels that the flecainide has helped since going on it. - Feel episodes are related to acute illness and has been in and out of afib since admission but cannot tell when he is in it  - Did not tolerate IV Cardizem due to hypotension - will continue flecainide for now since he is in and out of PAF and hopefully will be more effective at maintaining NSR once acute  illness improved but with ongoing lung issues this may become more frequent.Could try amio but with underlying lung disease would like to try to avoid this. - restarted Cardizem PO '30mg'$  Q 6 hours - NOW back in a fib/flutter will increase to 60 mg Q 6 hours.. - not on anticoagulation due to Lung mass - Palliative Care consult in progress due to progression of lung mass and will go home with Hospice and no more Chemo. Life expectancy 3-6 months. - F/U outpt with Dr. Harrington Challenger 09/10/14 at 11:45.  Not on anticoagulation, continue ASA   LOS: 7 days   Time spent with pt. :15 minutes. [facility] R  Nurse Practitioner Certified Pager [phone] or after 5pm and on weekends call [phone] 08/12/2014, 9:58 AM   Patient seen and examined with Cecilie Kicks, NP. We discussed all aspects of the encounter. I agree with the assessment and plan as stated above.  Now back in atrial fibrillation with RVR.  He has been in and out of PAF with RVR since admission.  Not an anticoagulation candidate due to lung mass.  Will increase Cardizem to '60mg'$  Q 6 hours to try to control HR when in afib and help prevent further episodes.  Change IV Lopressor to PO '25mg'$  BID and titrate as BP allows.   Signed: Fransico Him, MD [facility] 08/12/2014

## 2014-08-12 NOTE — Care Management Important Message (Signed)
Important Message  Patient Details  Name: CALEY VOLKERT MRN: [id number] Date of Birth: 19-Jul-1940   Medicare Important Message Given:  Yes-fourth notification given    Camillo Flaming 08/12/2014, 1:43 PMImportant Message  Patient Details  Name: RANSOME HELWIG MRN: [id number] Date of Birth: 04/24/1940   Medicare Important Message Given:  Yes-fourth notification given    Camillo Flaming 08/12/2014, 1:43 PM

## 2014-08-12 NOTE — Progress Notes (Signed)
[facility] PROGRESS NOTE   Assessment/Plan: Pneumoperitoneum/leukytosis: - CT abdomen pelvis on admission showed 2 air bubbles noted tracking along the PEG tube. G-tube was placed due to a 5.5 cm subcarinal mass impinging on the esophagus. - IR was consulted they agreed pneumopericardium most likely due to PEG tube. Plan for fluoroscopic study on 08/09/2014 to evaluate function a G-tube. - D/c antibiotics.  Metastatic non-small cell cancer: Systemic chemotherapy as an outpatient. Will change dilaudid to schedule.  A. fib with RVR/ PAF- on Flecainide, : CHADS2 vasc score at least 3 Now on in a flutter.  Dysphagia: Extrinsic compression of the esophagus.  Anemia due to chemotherapy/  Primary cancer of right lower lobe of lung: Hemoglobin seems to be stable.  Severe protein caloric malnutrition: As a consequence of chronic disease, continue ensure.  Protein-calorie malnutrition, severe   Code Status: DNR/DNI Family Communication: plan of care discussed with the patient Disposition Plan: Plan for fluoroscopic study on Monday to evaluate the function / placement of G-tube   Consultants:  IR  surgery  Procedures: Ct Chest W Contrast 08/05/2014 1. Large volume free intraperitoneal air. No focal abnormality is evident to identify a perforated viscus. 2. There is subcutaneous air in the left upper quadrant and in the lower left chest. There also are 2 air bubbles tracking along the percutaneous gastrostomy tube within the anterior abdominal wall. This suggests the possibility of the percutaneous gastrostomy being the etiology of the free air, but it is not conclusive. The gastrostomy device itself appears to be satisfactorily positioned. 3. In the chest, the left mainstem bronchial stent appears to be satisfactorily positioned. There is new consolidation of left lower lobe basal segments. There is worsened esophageal dilatation with air-fluid level, probably due to  compromise of the esophagus by the sub- carinal mass. There is slight interval enlargement of a moderate right pleural effusion. Electronically Signed By: Andreas Newport M.D. On: 08/05/2014 21:11   Ct Abdomen Pelvis W Contrast 08/05/2014 1. Large volume free intraperitoneal air. No focal abnormality is evident to identify a perforated viscus. 2. There is subcutaneous air in the left upper quadrant and in the lower left chest. There also are 2 air bubbles tracking along the percutaneous gastrostomy tube within the anterior abdominal wall. This suggests the possibility of the percutaneous gastrostomy being the etiology of the free air, but it is not conclusive. The gastrostomy device itself appears to be satisfactorily positioned. 3. In the chest, the left mainstem bronchial stent appears to be satisfactorily positioned. There is new consolidation of left lower lobe basal segments. There is worsened esophageal dilatation with air-fluid level, probably due to compromise of the esophagus by the sub- carinal mass. There is slight interval enlargement of a moderate right pleural effusion. Electronically Signed By: Andreas Newport M.D. On: 08/05/2014 21:11   Dg Abd Acute W/chest 08/05/2014 Free air. Further evaluation with CT of abdomen pelvis is recommended.   Antibiotics:  Zosyn since admission.  HPI/Subjective: Pain is control with current regimen.  But it take to long for nurses to bring it per patient.  Objective: Filed Vitals:   08/11/14 2008 08/11/14 2021 08/12/14 0430 08/12/14 0748  BP:  125/77 134/79   Pulse:  107 109   Temp:  98.4 F (36.9 C) 98.3 F (36.8 C)   TempSrc:  Oral Oral   Resp:  18 18   Height:      Weight:   72.1 kg (158 lb 15.2 oz)   SpO2: 94% 95%  98% 95%    Intake/Output Summary (Last 24 hours) at 08/12/14 0949 Last data filed at 08/12/14 0900  Gross per 24 hour  Intake 1693.75 ml  Output   2050 ml  Net -356.25 ml   Filed Weights   08/10/14 0439  08/11/14 0452 08/12/14 0430  Weight: 72.1 kg (158 lb 15.2 oz) 72 kg (158 lb 11.7 oz) 72.1 kg (158 lb 15.2 oz)    Exam:  General: Alert, awake, oriented x3, in no acute distress.  HEENT: No bruits, no goiter.  Heart: Regular rate and rhythm. Lungs: Good air movement, clear Abdomen: Soft, nontender, nondistended, positive bowel sounds.  Neuro: Grossly intact, nonfocal.   Data Reviewed: Basic Metabolic Panel:  Recent Labs Lab 08/05/14 1825 08/06/14 0430 08/10/14 1110 08/11/14 0340 08/12/14 0340  NA 133* 134* 136 136 137  K 3.6 3.5 3.2* 4.0 3.8  CL 94* 98* 101 104 104  CO2 '29 29 29 28 26  '$ GLUCOSE 168* 153* 133* 119* 124*  BUN '10 11 11 11 8  '$ CREATININE 0.57* 0.56* 0.67 0.56* 0.50*  CALCIUM 9.5 8.8* 8.5* 8.0* 8.4*  MG  --   --  2.0  --   --    Liver Function Tests:  Recent Labs Lab 08/05/14 1825 08/06/14 0430  AST 24 20  ALT 38 30  ALKPHOS 97 85  BILITOT 0.4 0.3  PROT 6.7 6.0*  ALBUMIN 2.5* 2.2*   No results for input(s): LIPASE, AMYLASE in the last 168 hours. No results for input(s): AMMONIA in the last 168 hours. CBC:  Recent Labs Lab 08/05/14 1825 08/06/14 0430  WBC 12.1* 11.4*  NEUTROABS 11.0*  --   HGB 10.4* 9.6*  HCT 32.9* 30.2*  MCV 90.4 90.7  PLT 613* 579*   Cardiac Enzymes: No results for input(s): CKTOTAL, CKMB, CKMBINDEX, TROPONINI in the last 168 hours. BNP (last 3 results)  Recent Labs  07/14/14 1916  BNP 333.5*    ProBNP (last 3 results) No results for input(s): PROBNP in the last 8760 hours.  CBG:  Recent Labs Lab 08/11/14 1148 08/11/14 1656 08/11/14 2015 08/11/14 2339 08/12/14 0422  GLUCAP 111* 94 91 147* 122*    Recent Results (from the past 240 hour(s))  MRSA PCR Screening     Status: None   Collection Time: 08/06/14  2:34 AM  Result Value Ref Range Status   MRSA by PCR NEGATIVE NEGATIVE Final    Comment:        The GeneXpert MRSA Assay (FDA approved for NASAL specimens only), is one component of  a comprehensive MRSA colonization surveillance program. It is not intended to diagnose MRSA infection nor to guide or monitor treatment for MRSA infections.      Studies: No results found.  Scheduled Meds: . albuterol  3 mL Inhalation BID  . amoxicillin-clavulanate  875 mg Oral q12n4p  . antiseptic oral rinse  7 mL Mouth Rinse q12n4p  . chlorhexidine  15 mL Mouth Rinse BID  . diltiazem  30 mg Per Tube 4 times per day  . feeding supplement (OSMOLITE 1.5 CAL)  237 mL Per Tube QID  . feeding supplement (PRO-STAT SUGAR FREE 64)  30 mL Per Tube BID  . HYDROmorphone HCl  5 mg Per Tube Q3H  . insulin aspart  0-15 Units Subcutaneous TID WC  . metoprolol  5 mg Intravenous 4 times per day  . pantoprazole sodium  40 mg Per Tube Daily  . polyethylene glycol  17 g Oral Daily  Continuous Infusions: . dextrose 5 % and 0.9% NaCl 75 mL/hr at 08/12/14 0143    Time Spent: 25 min   Steve [name]  [facility] Pager [phone]. If 7PM-7AM, please contact night-coverage at [email or web address], password [facility] 08/12/2014, 9:49 AM  LOS: 7 days

## 2014-08-12 NOTE — Progress Notes (Signed)
NUTRITION NOTE  Pt seen for follow up and to assess if he or wife had questions. Noted plan to d/c home with hospice. Pt's wife reports that they have enough TF cans at home and no other questions at this time.   Pt continues with order for 1 can Osmolite 1.5 QID with Prostat BID and 150 mL free water with each bolus which is providing 1622 kcal (74% minimal needs), 90 grams protein (82% minimal needs) and 724 mL free water + free water.  Goal is for 6 cans TF/day but will have hospice agency assist with needs and abilities once pt goes home.   Jarome Matin, RD, LDN Inpatient Clinical Dietitian Pager # [phone] After hours/weekend pager # [phone]

## 2014-08-12 NOTE — Progress Notes (Signed)
Pt HR sustaining 140s. MD notified. Will continue to monitor.

## 2014-08-13 ENCOUNTER — Telehealth: Payer: Self-pay

## 2014-08-13 DIAGNOSIS — Z515 Encounter for palliative care: Secondary | ICD-10-CM

## 2014-08-13 LAB — GLUCOSE, CAPILLARY
Glucose-Capillary: 123 mg/dL — ABNORMAL HIGH (ref 65–99)
Glucose-Capillary: 133 mg/dL — ABNORMAL HIGH (ref 65–99)
Glucose-Capillary: 137 mg/dL — ABNORMAL HIGH (ref 65–99)

## 2014-08-13 MED ORDER — DILTIAZEM 12 MG/ML ORAL SUSPENSION
90.0000 mg | Freq: Four times a day (QID) | ORAL | Status: DC
  Filled 2014-08-13 (×4): qty 9

## 2014-08-13 MED ORDER — METOPROLOL TARTRATE 25 MG PO TABS: 25.0000 mg | ORAL_TABLET | Freq: Two times a day (BID) | ORAL | Status: AC

## 2014-08-13 MED ORDER — OSMOLITE 1.5 CAL PO LIQD: 237.0000 mL | Freq: Four times a day (QID) | ORAL | Status: AC

## 2014-08-13 MED ORDER — PRO-STAT SUGAR FREE PO LIQD: 30.0000 mL | Freq: Two times a day (BID) | ORAL | Status: AC

## 2014-08-13 MED ORDER — HEPARIN SOD (PORK) LOCK FLUSH 100 UNIT/ML IV SOLN
500.0000 [IU] | INTRAVENOUS | Status: AC | PRN
  Administered 2014-08-13: 500 [IU]

## 2014-08-13 MED ORDER — HYDROMORPHONE HCL 1 MG/ML PO LIQD: 5.0000 mg | Freq: Four times a day (QID) | ORAL | Status: DC | PRN

## 2014-08-13 MED ORDER — HYDROMORPHONE HCL 1 MG/ML PO LIQD: 5.0000 mg | ORAL | Status: AC

## 2014-08-13 MED ORDER — HYDROMORPHONE HCL 1 MG/ML PO LIQD
5.0000 mg | ORAL | Status: DC
  Administered 2014-08-13: 5 mg
  Filled 2014-08-13: qty 5

## 2014-08-13 NOTE — Progress Notes (Signed)
Patient Name: Steve [name] Date of Encounter: 08/13/2014    Principal Problem:   Pneumoperitoneum of unknown etiology Active Problems:   Primary cancer of right lower lobe of lung   PAF- on Flecainide,    Wheezing   Pleural effusion, right   Dysphagia, pharyngoesophageal phase   Dyspnea   Malnutrition of moderate degree   Erosive esophagitis on recent endoscopy   Atrial fibrillation with rapid ventricular response   Odynophagia   Esophageal stricture   Gastrostomy in place   Pneumoperitoneum   Protein-calorie malnutrition, severe   Encounter for palliative care   Palliative care by specialist   Atrial fibrillation with RVR    SUBJECTIVE  Had a lot of pain yesterday and received multiple doses of dilaudid.  Now on '5mg'$  q 4 h and pain free.  Has continued to have PAF with rates into the 130's, though he is asymptomatic this AM.  Plan for D/C today with hospice/comfort care.  CURRENT MEDS . albuterol  3 mL Inhalation BID  . amoxicillin-clavulanate  875 mg Oral q12n4p  . antiseptic oral rinse  7 mL Mouth Rinse q12n4p  . chlorhexidine  15 mL Mouth Rinse BID  . diltiazem  60 mg Per Tube 4 times per day  . feeding supplement (OSMOLITE 1.5 CAL)  237 mL Per Tube QID  . feeding supplement (PRO-STAT SUGAR FREE 64)  30 mL Per Tube BID  . HYDROmorphone HCl  5 mg Per Tube Q4H  . insulin aspart  0-15 Units Subcutaneous TID WC  . metoprolol tartrate  25 mg Oral BID  . pantoprazole sodium  40 mg Per Tube Daily  . polyethylene glycol  17 g Oral Daily    OBJECTIVE  Filed Vitals:   08/12/14 1451 08/12/14 1954 08/12/14 2054 08/13/14 0416  BP: 124/76  109/88 130/66  Pulse: 101  124 99  Temp: 97.5 F (36.4 C)  98 F (36.7 C) 98.5 F (36.9 C)  TempSrc: Oral  Oral Oral  Resp: '19  18 18  '$ Height:      Weight:    159 lb 2.8 oz (72.2 kg)  SpO2: 93% 92% 93% 98%    Intake/Output Summary (Last 24 hours) at 08/13/14 0819 Last data filed at 08/13/14 0653  Gross per 24 hour  Intake  2472.5 ml  Output   1950 ml  Net  522.5 ml   Filed Weights   08/11/14 0452 08/12/14 0430 08/13/14 0416  Weight: 158 lb 11.7 oz (72 kg) 158 lb 15.2 oz (72.1 kg) 159 lb 2.8 oz (72.2 kg)    PHYSICAL EXAM  General: Pleasant, NAD. Neuro: Alert and oriented X 3. Moves all extremities spontaneously. Psych: Normal affect. HEENT:  Normal  Neck: Supple without bruits or JVD. Lungs:  Resp regular and unlabored, CTA. Heart: IR, IR, tachy no s3, s4, or murmurs. Abdomen: Soft, non-tender, non-distended, BS + x 4.  Extremities: No clubbing, cyanosis or edema. DP/PT/Radials 2+ and equal bilaterally.  Accessory Clinical Findings  Basic Metabolic Panel  Recent Labs  08/10/14 1110 08/11/14 0340 08/12/14 0340  NA 136 136 137  K 3.2* 4.0 3.8  CL 101 104 104  CO2 '29 28 26  '$ GLUCOSE 133* 119* 124*  BUN '11 11 8  '$ CREATININE 0.67 0.56* 0.50*  CALCIUM 8.5* 8.0* 8.4*  MG 2.0  --   --    TELE  Mostly sinus in past 24 hrs but has continued to have PAF with rates into 130's. Currently in AF.  Radiology/Studies  Dg Chest 2 View  07/24/2014   CLINICAL DATA:  Shortness of breath and difficulty swallowing.  EXAM: CHEST  2 VIEW  COMPARISON:  07/14/2014  FINDINGS: Right chest wall port a catheter is noted with tip in the cavoatrial junction. The heart size appears normal. Right pleural effusion is again identified. No left effusion. Right lower lobe lung mass is stable from previous exam.  IMPRESSION: 1. No acute findings and no significant change from 07/14/2014   Electronically Signed   By: Kerby Moors M.D.   On: 07/24/2014 16:01   Ct Chest W Contrast  08/05/2014   CLINICAL DATA:  Left mainstem bronchus stent placed this morning. Was found to have free intraperitoneal air and was sent to the emergency room.  EXAM: CT CHEST, ABDOMEN, AND PELVIS WITH CONTRAST  TECHNIQUE: Multidetector CT imaging of the chest, abdomen and pelvis was performed following the standard protocol during bolus administration  of intravenous contrast.  CONTRAST:  38m OMNIPAQUE IOHEXOL 300 MG/ML SOLN, 1059mOMNIPAQUE IOHEXOL 300 MG/ML SOLN  COMPARISON:  07/14/2014  FINDINGS: CT CHEST FINDINGS  The left mainstem bronchus stent appears well-positioned. Endo stent lumen is widely patent. There is no significant change in the peripherally enhancing centrally hypodense 5.5 cm sub- carinal mass. There is no significant change in the central right lower lobe peripherally enhancing 4.7 x 7.2 cm mass. There is a moderate right pleural effusion which is marginally larger than on 07/14/2014. There is consolidation and air bronchograms in the posterior basal segment of the left lower lobe, new, and there is a lesser degree of consolidation and volume loss in the lateral basal segment. There is a small pericardial effusion, unchanged. There is mild esophageal dilatation with an air-fluid level and this appears a little worsened. It may be due to compromise to the esophagus by the subcarinal mass.  There is no pneumothorax or pneumomediastinum.  CT ABDOMEN AND PELVIS FINDINGS  There is a large volume free intraperitoneal air. Most of the air is collected anteriorly over the liver and around the stomach. There also is a small volume of subcutaneous air in the left upper quadrant, above and below the percutaneous gastrostomy but not directly around it. On sagittal image 78, series 7 there are 2 air bubbles tracking alongside the percutaneous gastrostomy tube in the abdominal wall musculature.  No focal inflammatory changes are evident in the abdomen or pelvis. No focally inflamed or irregular bowel is evident to identified location of a perforated viscus. There are unremarkable appearances of the colon. The stomach is remarkable only for presence of the percutaneous gastrostomy. There is good contrast distention of the stomach and duodenum with no evidence of contrast extravasation. Small bowel is unremarkable.  There are normal appearances of the liver,  gallbladder, bile ducts, pancreas, spleen, adrenals and kidneys.  The abdominal aorta is normal in caliber with moderate atherosclerotic calcification.  No significant skeletal lesions are evident.  IMPRESSION: 1. Large volume free intraperitoneal air. No focal abnormality is evident to identify a perforated viscus. 2. There is subcutaneous air in the left upper quadrant and in the lower left chest. There also are 2 air bubbles tracking along the percutaneous gastrostomy tube within the anterior abdominal wall. This suggests the possibility of the percutaneous gastrostomy being the etiology of the free air, but it is not conclusive. The gastrostomy device itself appears to be satisfactorily positioned. 3. In the chest, the left mainstem bronchial stent appears to be satisfactorily positioned. There is new consolidation of left  lower lobe basal segments. There is worsened esophageal dilatation with air-fluid level, probably due to compromise of the esophagus by the sub- carinal mass. There is slight interval enlargement of a moderate right pleural effusion.   Electronically Signed   By: Andreas Newport M.D.   On: 08/05/2014 21:11   Ct Angio Chest Pe W/cm &/or Wo Cm  07/14/2014   CLINICAL DATA:  74 year old male with history of lung cancer and COPD complaining of shortness of breath.  EXAM: CT ANGIOGRAPHY CHEST WITH CONTRAST  TECHNIQUE: Multidetector CT imaging of the chest was performed using the standard protocol during bolus administration of intravenous contrast. Multiplanar CT image reconstructions and MIPs were obtained to evaluate the vascular anatomy.  CONTRAST:  137m OMNIPAQUE IOHEXOL 350 MG/ML SOLN  COMPARISON:  Chest radiograph dated 07/14/2014 and CT dated 05/14/2014.  FINDINGS: Mild emphysematous changes. There is stable appearing 7.0 x 4.5 cm right lower lobe mass with encasement of the right middle and right lower lobe bronchi and occlusion of the right lower lobe bronchus. There is associated  postobstructive atelectasis versus pneumonia. Stable moderate right pleural effusion again noted. There is apparent extension of the mass into the right hilar and subcarinal region.  Mild atherosclerotic calcification of the aorta. No CT evidence of pulmonary embolism. There is encasement and complete occlusion of the right lower lobe pulmonary artery branch by the lung mass. No cardiomegaly. There is coronary vascular calcification. Small pericardial effusion.  Right pectoral Port-A-Cath with tip in central SVC. Partially visualized subcentimeter left hepatic hypodense lesion, incompletely characterized. Degenerative changes of the spine. No acute fracture.  Review of the MIP images confirms the above findings.  IMPRESSION: No CT evidence of pulmonary embolism.  Stable appearing right lower lobe mass with encasement of the right lower lobe pulmonary arteries branch as well as right lower lobe bronchus. There is postobstructive atelectasis/ pneumonia.  Grossly stable right pleural effusion.  Scratch   Electronically Signed   By: AAnner CreteM.D.   On: 07/14/2014 22:45   Ct Abdomen Pelvis W Contrast  08/05/2014   CLINICAL DATA:  Left mainstem bronchus stent placed this morning. Was found to have free intraperitoneal air and was sent to the emergency room.  EXAM: CT CHEST, ABDOMEN, AND PELVIS WITH CONTRAST  TECHNIQUE: Multidetector CT imaging of the chest, abdomen and pelvis was performed following the standard protocol during bolus administration of intravenous contrast.  CONTRAST:  221mOMNIPAQUE IOHEXOL 300 MG/ML SOLN, 10030mMNIPAQUE IOHEXOL 300 MG/ML SOLN  COMPARISON:  07/14/2014  FINDINGS: CT CHEST FINDINGS  The left mainstem bronchus stent appears well-positioned. Endo stent lumen is widely patent. There is no significant change in the peripherally enhancing centrally hypodense 5.5 cm sub- carinal mass. There is no significant change in the central right lower lobe peripherally enhancing 4.7 x 7.2 cm  mass. There is a moderate right pleural effusion which is marginally larger than on 07/14/2014. There is consolidation and air bronchograms in the posterior basal segment of the left lower lobe, new, and there is a lesser degree of consolidation and volume loss in the lateral basal segment. There is a small pericardial effusion, unchanged. There is mild esophageal dilatation with an air-fluid level and this appears a little worsened. It may be due to compromise to the esophagus by the subcarinal mass.  There is no pneumothorax or pneumomediastinum.  CT ABDOMEN AND PELVIS FINDINGS  There is a large volume free intraperitoneal air. Most of the air is collected anteriorly over the liver and  around the stomach. There also is a small volume of subcutaneous air in the left upper quadrant, above and below the percutaneous gastrostomy but not directly around it. On sagittal image 78, series 7 there are 2 air bubbles tracking alongside the percutaneous gastrostomy tube in the abdominal wall musculature.  No focal inflammatory changes are evident in the abdomen or pelvis. No focally inflamed or irregular bowel is evident to identified location of a perforated viscus. There are unremarkable appearances of the colon. The stomach is remarkable only for presence of the percutaneous gastrostomy. There is good contrast distention of the stomach and duodenum with no evidence of contrast extravasation. Small bowel is unremarkable.  There are normal appearances of the liver, gallbladder, bile ducts, pancreas, spleen, adrenals and kidneys.  The abdominal aorta is normal in caliber with moderate atherosclerotic calcification.  No significant skeletal lesions are evident.  IMPRESSION: 1. Large volume free intraperitoneal air. No focal abnormality is evident to identify a perforated viscus. 2. There is subcutaneous air in the left upper quadrant and in the lower left chest. There also are 2 air bubbles tracking along the percutaneous  gastrostomy tube within the anterior abdominal wall. This suggests the possibility of the percutaneous gastrostomy being the etiology of the free air, but it is not conclusive. The gastrostomy device itself appears to be satisfactorily positioned. 3. In the chest, the left mainstem bronchial stent appears to be satisfactorily positioned. There is new consolidation of left lower lobe basal segments. There is worsened esophageal dilatation with air-fluid level, probably due to compromise of the esophagus by the sub- carinal mass. There is slight interval enlargement of a moderate right pleural effusion.   Electronically Signed   By: Andreas Newport M.D.   On: 08/05/2014 21:11   Ir Gastrostomy Tube Mod Sed  07/27/2014   CLINICAL DATA:  74 year old male with a history of dysphagia/strictures. He has been referred for percutaneous gastrostomy placement.  EXAM: PERCUTANEOUS GASTROSTOMY  FLUOROSCOPY TIME:  2 minutes 30 seconds minutes  MEDICATIONS AND MEDICAL HISTORY: Versed 2.5 mg, Fentanyl 125 mcg.  2 g Ancef  ANESTHESIA/SEDATION: Moderate sedation time: 27 minutes  CONTRAST:  25 cc Omni 300 enteric  PROCEDURE: The procedure, risks, benefits, and alternatives were explained to the patient. Questions regarding the procedure were encouraged and answered. The patient understands and consents to the procedure.  The epigastrium was prepped with Betadine in a sterile fashion, and a sterile drape was applied covering the operative field. A sterile gown and sterile gloves were used for the procedure.  A 5-French orogastric tube is placed under fluoroscopic guidance. Scout imaging of the abdomen confirms barium within the transverse colon.  The stomach was distended with gas. Under fluoroscopic guidance, an 18 gauge needle was utilized to puncture the anterior wall of the body of the stomach. An Amplatz wire was advanced through the needle passing a T fastener into the lumen of the stomach. The T fastener was secured for  gastropexy. A second T fastener was deployed using an 18 gauge needle adjacent to the first deployment.  Serial dilation was performed over the Amplatz wire at the location of the first puncture, with placement of a 20 French peel-away sheath. An 18 gauge balloon retention gastrostomy tube was then placed through the peel-away sheath over the wire. Once we confirmed position, the wire and peel-away sheath were removed. Contrast infused through the tube confirmed position and AP and lateral position.  The patient tolerated the procedure well and remained hemodynamically stable  throughout.  No complications were encountered and no significant blood loss was encountered.  COMPLICATIONS: None  FINDINGS: The image demonstrates placement of a 20-French balloon retention gastrostomy tube into the body of the stomach.  IMPRESSION: Status post placement of 20 French balloon retention gastrostomy.  Signed,  Dulcy Fanny. Earleen Newport DO  [facility]  [facility]  PLAN: Decompression for 24 hours on low wall suction.  The tube may be used starting after 24 hours.   Electronically Signed   By: Corrie Mckusick D.O.   On: 07/27/2014 17:49   Ir Cm Inj Any Colonic Tube W/fluoro  08/09/2014   CLINICAL DATA:  BALLOON RETENTION GASTROSTOMY, PNEUMOPERITONEUM BY RECENT CT 08/05/2014. Assess for gastrostomy position and leakage.  EXAM: GI TUBE INJECTION  COMPARISON:  08/05/2014 CT  FINDINGS: Under fluoroscopy, the existing balloon retention gastrostomy was injected with contrast. This confirms position within the stomach. Stomach is decompressed. No evidence of leakage or extravasation. No obstruction.  IMPRESSION: Gastrostomy within the stomach.  No complicating feature.   Electronically Signed   By: Jerilynn Mages.  Shick M.D.   On: 08/09/2014 14:56   Dg Chest Port 1 View  07/14/2014   CLINICAL DATA:  Worsening shortness of breath and chest pain. Currently on chemotherapy for right lower lobe lung carcinoma.   EXAM: PORTABLE CHEST - 1 VIEW  COMPARISON:  06/10/2014  FINDINGS: Right-sided power port remains in appropriate position. Right infrahilar mass like opacity shows no significant change allowing for differences in positioning. Pleural- parenchymal scarring at right lung base appears stable. No new areas of pulmonary opacity are identified. No evidence of pneumothorax or pleural effusion. Heart size is within normal limits.  IMPRESSION: No significant change in right infrahilar masslike opacity and right basilar scarring.   Electronically Signed   By: Earle Gell M.D.   On: 07/14/2014 19:33   Dg Abd Acute W/chest  08/05/2014   CLINICAL DATA:  Air in the abdomen. Patient had a stent placed in the left lung this morning. History of lung cancer.  EXAM: DG ABDOMEN ACUTE W/ 1V CHEST  COMPARISON:  July 24, 2014  FINDINGS: There is a small right pleural effusion enlarged compared to the prior exam. Prominence of right hilum is identified unchanged. The left lung is clear. The heart size is normal. Right central venous line is unchanged.  There is free air. Focal dilated bowel loop is noted in the lower abdomen/pelvis. Radiopaque needle like densities are projected over the abdomen. A PEG tube is identified.  IMPRESSION: Free air. Further evaluation with CT of abdomen pelvis is recommended.  These results will be called to the ordering clinician or representative by the Radiologist Assistant, and communication documented in the PACS or zVision Dashboard.   Electronically Signed   By: Abelardo Diesel M.D.   On: 08/05/2014 18:16    ASSESSMENT AND PLAN  1.  Pneumoperitoneum:  Abx per IM.  Feeling better this AM and awaiting d/c.  2.  Metastatic NSC CA: Plan for d/c today with hospice/comfort care.  3.  PAF:  Continues to have paroxysms of AF with rates into 130's in setting of pain.  Pain now better on freq dosing of dilaudid.  In afib this am but currently asymptomatic.  Remains flecainide with Dilt/bb adjusted  yesterday.  Given ongoing PAF with elevated rates and decision to pursue comfort measures, it may be reasonable to d/c flecainide, as it isnt' helping much at this point, allow it to wash out (20 hr  1/2 life), and start amio in 5 days.  I will d/w Dr. Radford Pax.  CHA2DS2VASc = 1.  No anticoag 2/2 lung mass.  Signed, Murray Hodgkins NP

## 2014-08-13 NOTE — Discharge Summary (Signed)
Physician Discharge Summary  Steve [name] WCH:[id number] DOB: 1940/09/16 DOA: 08/05/2014  PCP: [name], MD  Admit date: 08/05/2014 Discharge date: 08/13/2014  Time spent: 35 minutes  Recommendations for Outpatient Follow-up:  1. Will go home with hospice care focus on comfort.  Discharge Diagnoses:  Principal Problem:   Pneumoperitoneum of unknown etiology Active Problems:   Primary cancer of right lower lobe of lung   PAF- on Flecainide,    Wheezing   Pleural effusion, right   Dysphagia, pharyngoesophageal phase   Dyspnea   Malnutrition of moderate degree   Erosive esophagitis on recent endoscopy   Atrial fibrillation with rapid ventricular response   Odynophagia   Esophageal stricture   Gastrostomy in place   Pneumoperitoneum   Protein-calorie malnutrition, severe   Encounter for palliative care   Palliative care by specialist   Atrial fibrillation with RVR   Discharge Condition: guarded  Diet recommendation: comfort feeds  Filed Weights   08/11/14 0452 08/12/14 0430 08/13/14 0416  Weight: 72 kg (158 lb 11.7 oz) 72.1 kg (158 lb 15.2 oz) 72.2 kg (159 lb 2.8 oz)    History of present illness:  74 y.o. male With PMH significant for A fib, COPD, Diabetes, Non small lung cell cancer, Patient had bronchial stent placement at [facility] today 7-28, per report pulmonary fellow relates stent placement went well. Patient was on his way home from [facility] when he received a call, that he had air in the abdomen and he was refer to the near ED.  Patient also has been having left upper quadrant pain for months. He had endoscopy that showed esophageal ulcer and erosive gastritis. He has had G tube placed on July 19-2016. He denies worsening abdominal pain. Denies worsening dyspnea.  In the ED he was found to be in A fib RVR. He didn't take his medications today.  Dr Johney Maine with sx evaluated patient. He recommend Ct abdomen, pelvis and chest which showed: Large volume free  intraperitoneal air. No focal abnormality is evident to identify a perforated viscus. There is subcutaneous air in the left upper quadrant and in the lower left chest. There also are 2 air bubbles tracking along the  Hospital Course:  Pneumoperitoneum/leukytosis: - CT abdomen pelvis on admission showed 2 air bubbles noted tracking along the PEG tube. - IR was consulted they agreed pneumopericardium most likely due to PEG tube. Plan for fluoroscopic study on 08/09/2014 to evaluate function a G-tube, which showed no leakage or abnormalities. - antibitoics d/c. - he tolerate feeding.  Metastatic non-small cell cancer: Oncology consult, had long discussion with  Family and poor prognosis. - pt decide to move towards comfort care, PMT consult. Home with hospice. - due to his pain, he was d/c on dilaudid schedule 5 mg q 4hrs which controls his pain adequately.  A. fib with RVR/ PAF- on Flecainide, : CHADS2 vasc score at least 3 Cardiology consulted recommended to cont flecanaide, metoprolol and diltiazem.  Dysphagia: Due Extrinsic compression of the esophagus.  Anemia due to chemotherapy/ Primary cancer of right lower lobe of lung: Hemoglobin seems to be stable.  Severe protein caloric malnutrition: As a consequence of chronic disease, continue ensure.   Procedures: Ct Chest W Contrast 08/05/2014 1. Large volume free intraperitoneal air. No focal abnormality is evident to identify a perforated viscus. 2. There is subcutaneous air in the left upper quadrant and in the lower left chest. There also are 2 air bubbles tracking along the percutaneous gastrostomy tube within the  anterior abdominal wall. This suggests the possibility of the percutaneous gastrostomy being the etiology of the free air, but it is not conclusive. The gastrostomy device itself appears to be satisfactorily positioned. 3. In the chest, the left mainstem bronchial stent appears to be satisfactorily positioned. There is new  consolidation of left lower lobe basal segments. There is worsened esophageal dilatation with air-fluid level, probably due to compromise of the esophagus by the sub- carinal mass. There is slight interval enlargement of a moderate right pleural effusion. Electronically Signed By: Andreas Newport M.D. On: 08/05/2014 21:11   Ct Abdomen Pelvis W Contrast 08/05/2014 1. Large volume free intraperitoneal air. No focal abnormality is evident to identify a perforated viscus. 2. There is subcutaneous air in the left upper quadrant and in the lower left chest. There also are 2 air bubbles tracking along the percutaneous gastrostomy tube within the anterior abdominal wall. This suggests the possibility of the percutaneous gastrostomy being the etiology of the free air, but it is not conclusive. The gastrostomy device itself appears to be satisfactorily positioned. 3. In the chest, the left mainstem bronchial stent appears to be satisfactorily positioned. There is new consolidation of left lower lobe basal segments. There is worsened esophageal dilatation with air-fluid level, probably due to compromise of the esophagus by the sub- carinal mass. There is slight interval enlargement of a moderate right pleural effusion. Electronically Signed By: Andreas Newport M.D. On: 08/05/2014 21:11   Dg Abd Acute W/chest 08/05/2014 Free air. Further evaluation with CT of abdomen pelvis is recommended.    (i.e. Studies not automatically included, echos, thoracentesis, etc; not x-rays)  Consultations:  Cardiology  IR  surgeyr  Discharge Exam: Filed Vitals:   08/13/14 0416  BP: 130/66  Pulse: 99  Temp: 98.5 F (36.9 C)  Resp: 18    General: A&O x3 Cardiovascular: RRR Respiratory: good air movement CTA B/L  Discharge Instructions   Discharge Instructions    Diet - low sodium heart healthy    Complete by:  As directed      Increase activity slowly    Complete by:  As directed            Current Discharge Medication List    START taking these medications   Details  !! Amino Acids-Protein Hydrolys (FEEDING SUPPLEMENT, PRO-STAT SUGAR FREE 64,) LIQD Place 30 mLs into feeding tube 2 (two) times daily. Qty: 900 mL, Refills: 0    HYDROmorphone HCl (DILAUDID) 1 MG/ML LIQD Place 5 mLs (5 mg total) into feeding tube every 4 (four) hours. Qty: 56 mL, Refills: 0    metoprolol tartrate (LOPRESSOR) 25 MG tablet Take 1 tablet (25 mg total) by mouth 2 (two) times daily. Qty: 60 tablet, Refills: 1     !! - Potential duplicate medications found. Please discuss with provider.    CONTINUE these medications which have CHANGED   Details  !! Nutritional Supplements (FEEDING SUPPLEMENT, OSMOLITE 1.5 CAL,) LIQD Place 237 mLs into feeding tube 4 (four) times daily. Qty: 1 Bottle, Refills: 30     !! - Potential duplicate medications found. Please discuss with provider.    CONTINUE these medications which have NOT CHANGED   Details  albuterol (PROVENTIL) (5 MG/ML) 0.5% nebulizer solution Inhale 0.5 mLs into the lungs 2 (two) times daily.    diltiazem (CARDIZEM) 90 MG tablet Take 1 tablet (90 mg total) by mouth every 6 (six) hours. Qty: 360 tablet, Refills: 4    flecainide (TAMBOCOR) 100 MG tablet  Place 1 tablet (100 mg total) into feeding tube 2 (two) times daily. Qty: 30 tablet, Refills: 0    guaiFENesin (MUCINEX) 600 MG 12 hr tablet Take 600 mg by mouth every 12 (twelve) hours as needed for cough.    !! Nutritional Supplements (FEEDING SUPPLEMENT, OSMOLITE 1.5 CAL,) LIQD Place 237 mLs into feeding tube 4 (four) times daily. Qty: 90 Bottle, Refills: 0    omeprazole (PRILOSEC) 40 MG capsule Take 40 mg by mouth 2 (two) times daily.  Refills: 4    polyethylene glycol (MIRALAX / GLYCOLAX) packet Take 17 g by mouth daily as needed for mild constipation or moderate constipation.     simvastatin (ZOCOR) 20 MG tablet Place 1 tablet (20 mg total) into feeding tube daily. Take one tablet  by mouth once daily to lower cholesterol Qty: 90 tablet, Refills: 3    temazepam (RESTORIL) 30 MG capsule Place 1 capsule (30 mg total) into feeding tube at bedtime as needed for sleep. Qty: 30 capsule, Refills: 0    Water For Irrigation, Sterile (FREE WATER) SOLN Place 120 mLs into feeding tube 4 (four) times daily.    !! Amino Acids-Protein Hydrolys (FEEDING SUPPLEMENT, PRO-STAT SUGAR FREE 64,) LIQD Place 30 mLs into feeding tube 2 (two) times daily. Qty: 900 mL, Refills: 0    pantoprazole sodium (PROTONIX) 40 mg/20 mL PACK Place 40 mLs (80 mg total) into feeding tube daily. Qty: 30 each, Refills: 1     !! - Potential duplicate medications found. Please discuss with provider.    STOP taking these medications     aspirin 81 MG tablet      lidocaine (XYLOCAINE) 2 % solution      oxyCODONE (ROXICODONE) 5 MG/5ML solution      sodium chloride HYPERTONIC 3 % nebulizer solution        Allergies  Allergen Reactions  . Morphine Hives    Tolerated dilaudid on multiple occasions.  Takes OxyContin at home  . Morphine And Related Hives    Tolerated dilaudid on multiple occasions.  Takes OxyContin at home   Follow-up Information    Follow up with Dorris Carnes, MD On 09/10/2014.   Specialty:  Cardiology   Why:  at 11:45 AM    Contact information:   [address] [address] [phone]        The results of significant diagnostics from this hospitalization (including imaging, microbiology, ancillary and laboratory) are listed below for reference.    Significant Diagnostic Studies: Dg Chest 2 View  07/24/2014   CLINICAL DATA:  Shortness of breath and difficulty swallowing.  EXAM: CHEST  2 VIEW  COMPARISON:  07/14/2014  FINDINGS: Right chest wall port a catheter is noted with tip in the cavoatrial junction. The heart size appears normal. Right pleural effusion is again identified. No left effusion. Right lower lobe lung mass is stable from previous exam.   IMPRESSION: 1. No acute findings and no significant change from 07/14/2014   Electronically Signed   By: Kerby Moors M.D.   On: 07/24/2014 16:01   Ct Chest W Contrast  08/05/2014   CLINICAL DATA:  Left mainstem bronchus stent placed this morning. Was found to have free intraperitoneal air and was sent to the emergency room.  EXAM: CT CHEST, ABDOMEN, AND PELVIS WITH CONTRAST  TECHNIQUE: Multidetector CT imaging of the chest, abdomen and pelvis was performed following the standard protocol during bolus administration of intravenous contrast.  CONTRAST:  37m OMNIPAQUE IOHEXOL  300 MG/ML SOLN, 196m OMNIPAQUE IOHEXOL 300 MG/ML SOLN  COMPARISON:  07/14/2014  FINDINGS: CT CHEST FINDINGS  The left mainstem bronchus stent appears well-positioned. Endo stent lumen is widely patent. There is no significant change in the peripherally enhancing centrally hypodense 5.5 cm sub- carinal mass. There is no significant change in the central right lower lobe peripherally enhancing 4.7 x 7.2 cm mass. There is a moderate right pleural effusion which is marginally larger than on 07/14/2014. There is consolidation and air bronchograms in the posterior basal segment of the left lower lobe, new, and there is a lesser degree of consolidation and volume loss in the lateral basal segment. There is a small pericardial effusion, unchanged. There is mild esophageal dilatation with an air-fluid level and this appears a little worsened. It may be due to compromise to the esophagus by the subcarinal mass.  There is no pneumothorax or pneumomediastinum.  CT ABDOMEN AND PELVIS FINDINGS  There is a large volume free intraperitoneal air. Most of the air is collected anteriorly over the liver and around the stomach. There also is a small volume of subcutaneous air in the left upper quadrant, above and below the percutaneous gastrostomy but not directly around it. On sagittal image 78, series 7 there are 2 air bubbles tracking alongside the  percutaneous gastrostomy tube in the abdominal wall musculature.  No focal inflammatory changes are evident in the abdomen or pelvis. No focally inflamed or irregular bowel is evident to identified location of a perforated viscus. There are unremarkable appearances of the colon. The stomach is remarkable only for presence of the percutaneous gastrostomy. There is good contrast distention of the stomach and duodenum with no evidence of contrast extravasation. Small bowel is unremarkable.  There are normal appearances of the liver, gallbladder, bile ducts, pancreas, spleen, adrenals and kidneys.  The abdominal aorta is normal in caliber with moderate atherosclerotic calcification.  No significant skeletal lesions are evident.  IMPRESSION: 1. Large volume free intraperitoneal air. No focal abnormality is evident to identify a perforated viscus. 2. There is subcutaneous air in the left upper quadrant and in the lower left chest. There also are 2 air bubbles tracking along the percutaneous gastrostomy tube within the anterior abdominal wall. This suggests the possibility of the percutaneous gastrostomy being the etiology of the free air, but it is not conclusive. The gastrostomy device itself appears to be satisfactorily positioned. 3. In the chest, the left mainstem bronchial stent appears to be satisfactorily positioned. There is new consolidation of left lower lobe basal segments. There is worsened esophageal dilatation with air-fluid level, probably due to compromise of the esophagus by the sub- carinal mass. There is slight interval enlargement of a moderate right pleural effusion.   Electronically Signed   By: DAndreas NewportM.D.   On: 08/05/2014 21:11   Ct Angio Chest Pe W/cm &/or Wo Cm  07/14/2014   CLINICAL DATA:  74year old male with history of lung cancer and COPD complaining of shortness of breath.  EXAM: CT ANGIOGRAPHY CHEST WITH CONTRAST  TECHNIQUE: Multidetector CT imaging of the chest was performed  using the standard protocol during bolus administration of intravenous contrast. Multiplanar CT image reconstructions and MIPs were obtained to evaluate the vascular anatomy.  CONTRAST:  109mOMNIPAQUE IOHEXOL 350 MG/ML SOLN  COMPARISON:  Chest radiograph dated 07/14/2014 and CT dated 05/14/2014.  FINDINGS: Mild emphysematous changes. There is stable appearing 7.0 x 4.5 cm right lower lobe mass with encasement of the right middle and right  lower lobe bronchi and occlusion of the right lower lobe bronchus. There is associated postobstructive atelectasis versus pneumonia. Stable moderate right pleural effusion again noted. There is apparent extension of the mass into the right hilar and subcarinal region.  Mild atherosclerotic calcification of the aorta. No CT evidence of pulmonary embolism. There is encasement and complete occlusion of the right lower lobe pulmonary artery branch by the lung mass. No cardiomegaly. There is coronary vascular calcification. Small pericardial effusion.  Right pectoral Port-A-Cath with tip in central SVC. Partially visualized subcentimeter left hepatic hypodense lesion, incompletely characterized. Degenerative changes of the spine. No acute fracture.  Review of the MIP images confirms the above findings.  IMPRESSION: No CT evidence of pulmonary embolism.  Stable appearing right lower lobe mass with encasement of the right lower lobe pulmonary arteries branch as well as right lower lobe bronchus. There is postobstructive atelectasis/ pneumonia.  Grossly stable right pleural effusion.  Scratch   Electronically Signed   By: Anner Crete M.D.   On: 07/14/2014 22:45   Ct Abdomen Pelvis W Contrast  08/05/2014   CLINICAL DATA:  Left mainstem bronchus stent placed this morning. Was found to have free intraperitoneal air and was sent to the emergency room.  EXAM: CT CHEST, ABDOMEN, AND PELVIS WITH CONTRAST  TECHNIQUE: Multidetector CT imaging of the chest, abdomen and pelvis was performed  following the standard protocol during bolus administration of intravenous contrast.  CONTRAST:  71m OMNIPAQUE IOHEXOL 300 MG/ML SOLN, 1079mOMNIPAQUE IOHEXOL 300 MG/ML SOLN  COMPARISON:  07/14/2014  FINDINGS: CT CHEST FINDINGS  The left mainstem bronchus stent appears well-positioned. Endo stent lumen is widely patent. There is no significant change in the peripherally enhancing centrally hypodense 5.5 cm sub- carinal mass. There is no significant change in the central right lower lobe peripherally enhancing 4.7 x 7.2 cm mass. There is a moderate right pleural effusion which is marginally larger than on 07/14/2014. There is consolidation and air bronchograms in the posterior basal segment of the left lower lobe, new, and there is a lesser degree of consolidation and volume loss in the lateral basal segment. There is a small pericardial effusion, unchanged. There is mild esophageal dilatation with an air-fluid level and this appears a little worsened. It may be due to compromise to the esophagus by the subcarinal mass.  There is no pneumothorax or pneumomediastinum.  CT ABDOMEN AND PELVIS FINDINGS  There is a large volume free intraperitoneal air. Most of the air is collected anteriorly over the liver and around the stomach. There also is a small volume of subcutaneous air in the left upper quadrant, above and below the percutaneous gastrostomy but not directly around it. On sagittal image 78, series 7 there are 2 air bubbles tracking alongside the percutaneous gastrostomy tube in the abdominal wall musculature.  No focal inflammatory changes are evident in the abdomen or pelvis. No focally inflamed or irregular bowel is evident to identified location of a perforated viscus. There are unremarkable appearances of the colon. The stomach is remarkable only for presence of the percutaneous gastrostomy. There is good contrast distention of the stomach and duodenum with no evidence of contrast extravasation. Small bowel  is unremarkable.  There are normal appearances of the liver, gallbladder, bile ducts, pancreas, spleen, adrenals and kidneys.  The abdominal aorta is normal in caliber with moderate atherosclerotic calcification.  No significant skeletal lesions are evident.  IMPRESSION: 1. Large volume free intraperitoneal air. No focal abnormality is evident to identify a perforated  viscus. 2. There is subcutaneous air in the left upper quadrant and in the lower left chest. There also are 2 air bubbles tracking along the percutaneous gastrostomy tube within the anterior abdominal wall. This suggests the possibility of the percutaneous gastrostomy being the etiology of the free air, but it is not conclusive. The gastrostomy device itself appears to be satisfactorily positioned. 3. In the chest, the left mainstem bronchial stent appears to be satisfactorily positioned. There is new consolidation of left lower lobe basal segments. There is worsened esophageal dilatation with air-fluid level, probably due to compromise of the esophagus by the sub- carinal mass. There is slight interval enlargement of a moderate right pleural effusion.   Electronically Signed   By: Andreas Newport M.D.   On: 08/05/2014 21:11   Ir Gastrostomy Tube Mod Sed  07/27/2014   CLINICAL DATA:  74 year old male with a history of dysphagia/strictures. He has been referred for percutaneous gastrostomy placement.  EXAM: PERCUTANEOUS GASTROSTOMY  FLUOROSCOPY TIME:  2 minutes 30 seconds minutes  MEDICATIONS AND MEDICAL HISTORY: Versed 2.5 mg, Fentanyl 125 mcg.  2 g Ancef  ANESTHESIA/SEDATION: Moderate sedation time: 27 minutes  CONTRAST:  25 cc Omni 300 enteric  PROCEDURE: The procedure, risks, benefits, and alternatives were explained to the patient. Questions regarding the procedure were encouraged and answered. The patient understands and consents to the procedure.  The epigastrium was prepped with Betadine in a sterile fashion, and a sterile drape was applied  covering the operative field. A sterile gown and sterile gloves were used for the procedure.  A 5-French orogastric tube is placed under fluoroscopic guidance. Scout imaging of the abdomen confirms barium within the transverse colon.  The stomach was distended with gas. Under fluoroscopic guidance, an 18 gauge needle was utilized to puncture the anterior wall of the body of the stomach. An Amplatz wire was advanced through the needle passing a T fastener into the lumen of the stomach. The T fastener was secured for gastropexy. A second T fastener was deployed using an 18 gauge needle adjacent to the first deployment.  Serial dilation was performed over the Amplatz wire at the location of the first puncture, with placement of a 20 French peel-away sheath. An 18 gauge balloon retention gastrostomy tube was then placed through the peel-away sheath over the wire. Once we confirmed position, the wire and peel-away sheath were removed. Contrast infused through the tube confirmed position and AP and lateral position.  The patient tolerated the procedure well and remained hemodynamically stable throughout.  No complications were encountered and no significant blood loss was encountered.  COMPLICATIONS: None  FINDINGS: The image demonstrates placement of a 20-French balloon retention gastrostomy tube into the body of the stomach.  IMPRESSION: Status post placement of 20 French balloon retention gastrostomy.  Signed,  Dulcy Fanny. Earleen Newport DO  [facility]  [facility]  PLAN: Decompression for 24 hours on low wall suction.  The tube may be used starting after 24 hours.   Electronically Signed   By: Corrie Mckusick D.O.   On: 07/27/2014 17:49   Ir Cm Inj Any Colonic Tube W/fluoro  08/09/2014   CLINICAL DATA:  BALLOON RETENTION GASTROSTOMY, PNEUMOPERITONEUM BY RECENT CT 08/05/2014. Assess for gastrostomy position and leakage.  EXAM: GI TUBE INJECTION  COMPARISON:  08/05/2014 CT   FINDINGS: Under fluoroscopy, the existing balloon retention gastrostomy was injected with contrast. This confirms position within the stomach. Stomach is decompressed. No evidence of leakage or extravasation. No  obstruction.  IMPRESSION: Gastrostomy within the stomach.  No complicating feature.   Electronically Signed   By: Jerilynn Mages.  Shick M.D.   On: 08/09/2014 14:56   Dg Chest Port 1 View  07/14/2014   CLINICAL DATA:  Worsening shortness of breath and chest pain. Currently on chemotherapy for right lower lobe lung carcinoma.  EXAM: PORTABLE CHEST - 1 VIEW  COMPARISON:  06/10/2014  FINDINGS: Right-sided power port remains in appropriate position. Right infrahilar mass like opacity shows no significant change allowing for differences in positioning. Pleural- parenchymal scarring at right lung base appears stable. No new areas of pulmonary opacity are identified. No evidence of pneumothorax or pleural effusion. Heart size is within normal limits.  IMPRESSION: No significant change in right infrahilar masslike opacity and right basilar scarring.   Electronically Signed   By: Earle Gell M.D.   On: 07/14/2014 19:33   Dg Abd Acute W/chest  08/05/2014   CLINICAL DATA:  Air in the abdomen. Patient had a stent placed in the left lung this morning. History of lung cancer.  EXAM: DG ABDOMEN ACUTE W/ 1V CHEST  COMPARISON:  July 24, 2014  FINDINGS: There is a small right pleural effusion enlarged compared to the prior exam. Prominence of right hilum is identified unchanged. The left lung is clear. The heart size is normal. Right central venous line is unchanged.  There is free air. Focal dilated bowel loop is noted in the lower abdomen/pelvis. Radiopaque needle like densities are projected over the abdomen. A PEG tube is identified.  IMPRESSION: Free air. Further evaluation with CT of abdomen pelvis is recommended.  These results will be called to the ordering clinician or representative by the Radiologist Assistant, and  communication documented in the PACS or zVision Dashboard.   Electronically Signed   By: Abelardo Diesel M.D.   On: 08/05/2014 18:16    Microbiology: Recent Results (from the past 240 hour(s))  MRSA PCR Screening     Status: None   Collection Time: 08/06/14  2:34 AM  Result Value Ref Range Status   MRSA by PCR NEGATIVE NEGATIVE Final    Comment:        The GeneXpert MRSA Assay (FDA approved for NASAL specimens only), is one component of a comprehensive MRSA colonization surveillance program. It is not intended to diagnose MRSA infection nor to guide or monitor treatment for MRSA infections.      Labs: Basic Metabolic Panel:  Recent Labs Lab 08/10/14 1110 08/11/14 0340 08/12/14 0340  NA 136 136 137  K 3.2* 4.0 3.8  CL 101 104 104  CO2 '29 28 26  '$ GLUCOSE 133* 119* 124*  BUN '11 11 8  '$ CREATININE 0.67 0.56* 0.50*  CALCIUM 8.5* 8.0* 8.4*  MG 2.0  --   --    Liver Function Tests: No results for input(s): AST, ALT, ALKPHOS, BILITOT, PROT, ALBUMIN in the last 168 hours. No results for input(s): LIPASE, AMYLASE in the last 168 hours. No results for input(s): AMMONIA in the last 168 hours. CBC: No results for input(s): WBC, NEUTROABS, HGB, HCT, MCV, PLT in the last 168 hours. Cardiac Enzymes: No results for input(s): CKTOTAL, CKMB, CKMBINDEX, TROPONINI in the last 168 hours. BNP: BNP (last 3 results)  Recent Labs  07/14/14 1916  BNP 333.5*    ProBNP (last 3 results) No results for input(s): PROBNP in the last 8760 hours.  CBG:  Recent Labs Lab 08/12/14 1616 08/12/14 2013 08/13/14 0018 08/13/14 0419 08/13/14 0746  GLUCAP 120*  147* 133* 123* 137*       Signed:  Charlynne Cousins  [facility] 08/13/2014, 10:11 AM

## 2014-08-13 NOTE — Telephone Encounter (Signed)
Pam called to inform Dr. Julien Nordmann that Steve [name]'s O2 Sats are running ~88-89 % on room air.  O2 2 liters [place] prn ordered for patient.

## 2014-08-16 ENCOUNTER — Telehealth: Payer: Self-pay | Admitting: Internal Medicine

## 2014-08-16 ENCOUNTER — Telehealth: Payer: Self-pay | Admitting: Medical Oncology

## 2014-08-16 NOTE — Telephone Encounter (Signed)
Steve [name] agreed to DNR -called to tanya

## 2014-08-16 NOTE — Telephone Encounter (Signed)
pt wife called to cx appt....pt did not want to r/s at this time

## 2014-08-17 ENCOUNTER — Ambulatory Visit: Payer: Medicare HMO | Admitting: Internal Medicine

## 2014-08-17 ENCOUNTER — Other Ambulatory Visit: Payer: Medicare HMO

## 2014-08-19 ENCOUNTER — Ambulatory Visit: Payer: Medicare HMO | Admitting: Gastroenterology

## 2014-08-20 ENCOUNTER — Telehealth: Payer: Self-pay | Admitting: *Deleted

## 2014-08-20 NOTE — Telephone Encounter (Signed)
Pt passed 09-01-14. pof to scheduling to cancel all future appts

## 2014-08-23 ENCOUNTER — Ambulatory Visit: Payer: Medicare HMO | Admitting: Internal Medicine

## 2014-08-30 ENCOUNTER — Ambulatory Visit: Payer: Medicare HMO | Admitting: Internal Medicine

## 2014-08-30 ENCOUNTER — Other Ambulatory Visit: Payer: Medicare HMO

## 2014-09-03 NOTE — Progress Notes (Signed)
This encounter was created in error - please disregard.

## 2014-09-09 DEATH — deceased

## 2014-09-10 ENCOUNTER — Encounter: Payer: Medicare HMO | Admitting: Internal Medicine

## 2015-06-28 ENCOUNTER — Other Ambulatory Visit: Payer: Self-pay | Admitting: Nurse Practitioner

## 2016-08-01 IMAGING — CT CT ANGIO CHEST
3 of 6 series · 11 of 30 positions shown · IV contrast (I330)
Comparison: 03/19/2014

CLINICAL DATA: rt lung ca '14 w/ no surgery, chemo only,, now w/ lt
t cp w/ productive cough and sob, sob, ? pe or pneumonia, ex-smoker
x 7 yrs

EXAM:
CT ANGIOGRAPHY CHEST WITH CONTRAST
TECHNIQUE: Multidetector CT imaging of the chest was performed using the
standard protocol during bolus administration of intravenous
contrast. Multiplanar CT image reconstructions and MIPs were
obtained to evaluate the vascular anatomy.
CONTRAST:  80 mL Isovue 370 IV

[Series 3: pe 1.25 · axial · 0.70mm/px · z∈[-311,-95]mm · 6 of 243 slices shown]
[im 35/243  lung]
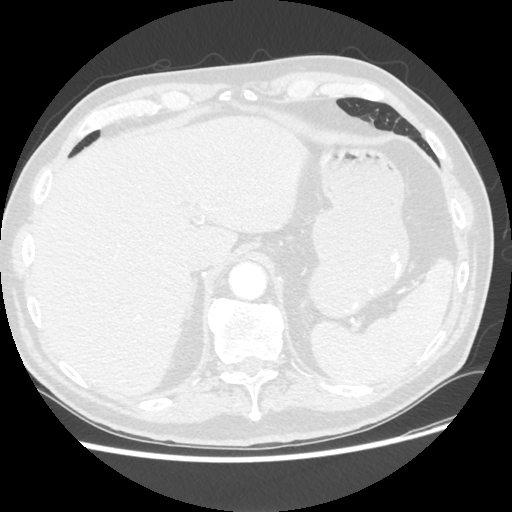
[im 70/243  mediastinal]
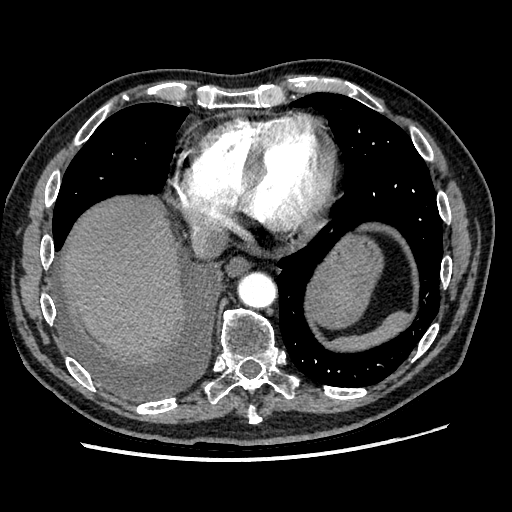
[im 104/243  lung]
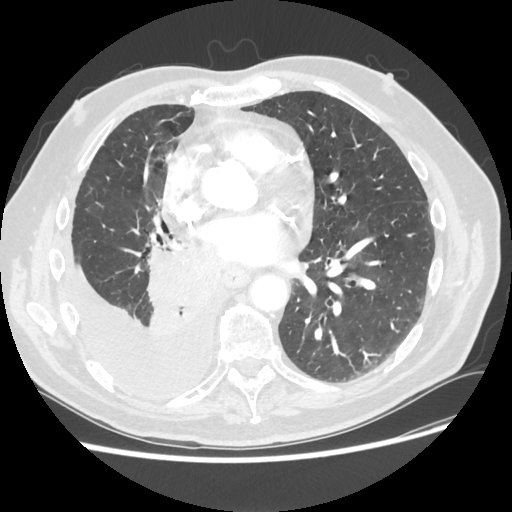
[im 139/243  mediastinal]
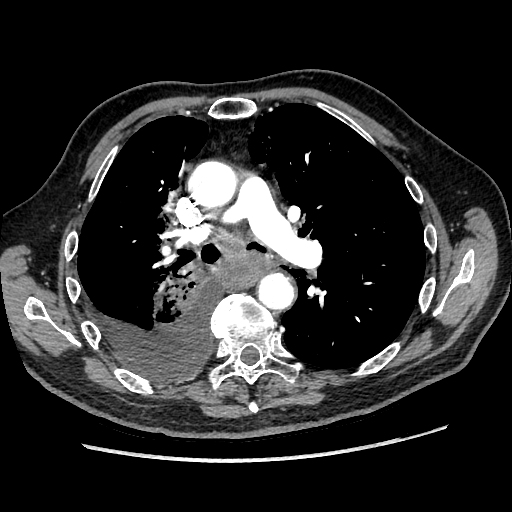
[im 173/243  lung]
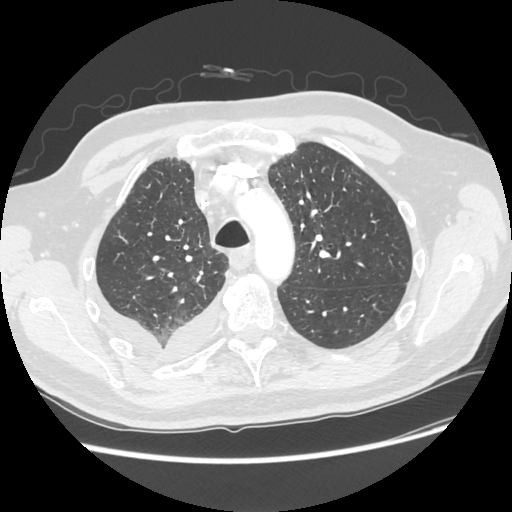
[im 208/243  mediastinal]
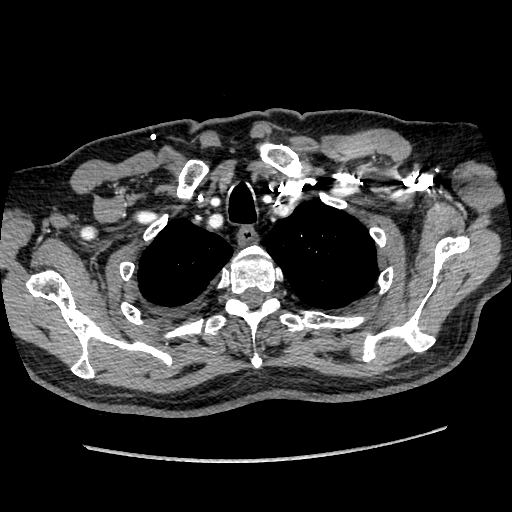

[Series 4: pe 2.5 · axial · 0.70mm/px · z∈[-254,-154]mm · 2 of 122 slices shown]
[im 41/122  lung]
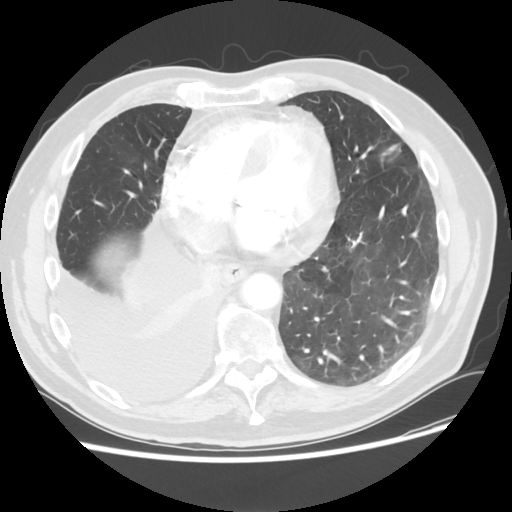
[im 81/122  lung]
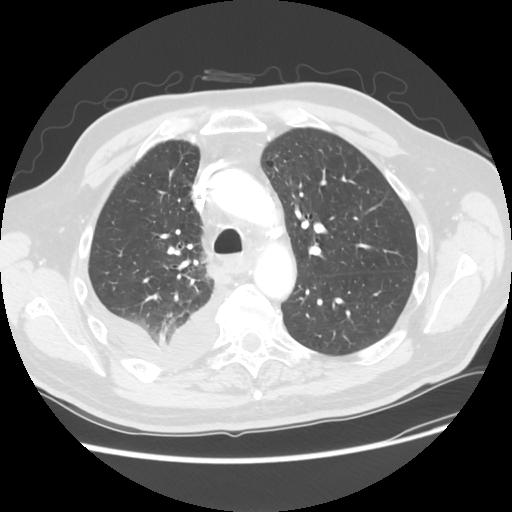

[Series 602: sagittal body · sagittal · 0.70mm/px · 3 of 145 slices shown]
[im 37/145  lung]
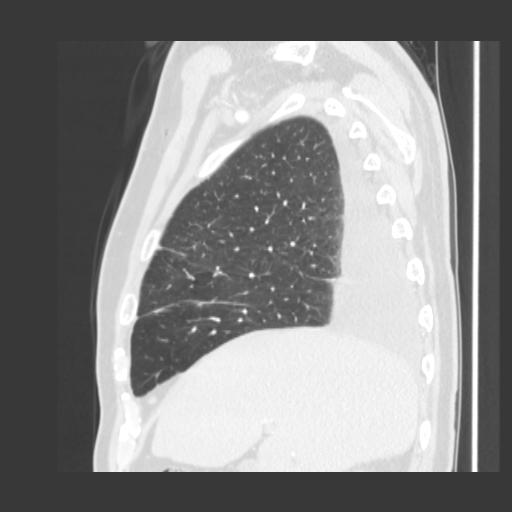
[im 73/145  lung]
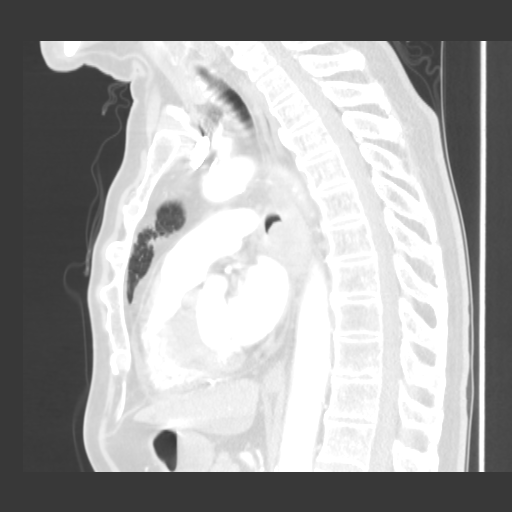
[im 109/145  lung]
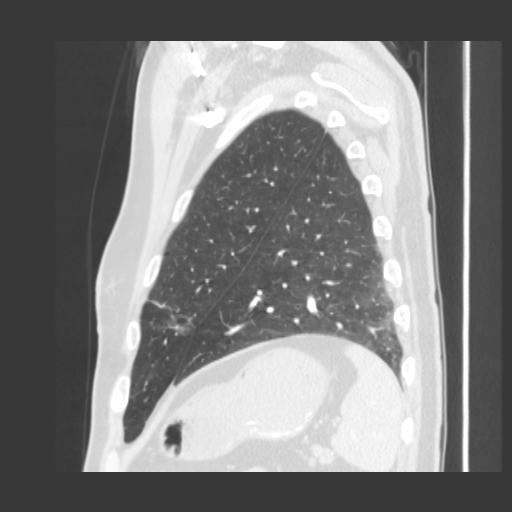

[11 of 30 positions shown; findings below may reference images not displayed]

FINDINGS: Right IJ port catheter to the cavoatrial junction. Left arm contrast
injection for CTA. The SVC is patent. Satisfactory opacification of
pulmonary arteries noted, and there is no evidence of pulmonary
emboli. Chronic encasement and occlusion of the right lower lobe
branch pulmonary artery and right inferior pulmonary vein. Remainder
pulmonary veins remain patent. Moderate coronary calcifications.
Adequate contrast opacification of the thoracic aorta with no
evidence of dissection, aneurysm, or stenosis. There is classic
3-vessel brachiocephalic arch anatomy without proximal stenosis.
Patchy aortic arch and descending segment calcifications.

Some increase in moderate right pleural effusion. Trace pericardial
effusion now evident. Stable anterior mediastinal, right
paratracheal, precarinal and prevascular sub cm lymph nodes. Bulky
subcarinal adenopathy.

Consolidative mass in the right lower lobe with encasement and
narrowing of proximal right lower lobe segmental bronchi and
extensive atelectasis throughout much of the right lower lobe. Left
lung remains clear.

Flowing osteophytes across multiple contiguous levels in the mid and
lower thoracic spine. Sternum intact.

Visualized portions of upper abdomen unremarkable.

Review of the MIP images confirms the above findings.
IMPRESSION: 1. Negative for acute PE or thoracic aortic dissection.
2. Central right lower lobe mass with encasement of right lower lobe
artery and inferior pulmonary vein as well as central airway
narrowing and peripheral atelectasis/consolidation as before.
3. Persistent bulky subcarinal adenopathy.
4. Slight increase in moderate right pleural effusion
